# Patient Record
Sex: Female | Born: 1965 | Race: White | Hispanic: No | Marital: Single | Smoking: Never smoker
Health system: Southern US, Community
[De-identification: ages and names within clinical notes are randomized; demographics above are authoritative.]

## PROBLEM LIST (undated history)

## (undated) DIAGNOSIS — J189 Pneumonia, unspecified organism: Secondary | ICD-10-CM

## (undated) DIAGNOSIS — M25552 Pain in left hip: Secondary | ICD-10-CM

## (undated) DIAGNOSIS — E162 Hypoglycemia, unspecified: Secondary | ICD-10-CM

## (undated) DIAGNOSIS — M545 Low back pain, unspecified: Secondary | ICD-10-CM

## (undated) DIAGNOSIS — R51 Headache: Secondary | ICD-10-CM

## (undated) DIAGNOSIS — G43909 Migraine, unspecified, not intractable, without status migrainosus: Secondary | ICD-10-CM

## (undated) DIAGNOSIS — F32A Depression, unspecified: Secondary | ICD-10-CM

## (undated) DIAGNOSIS — J42 Unspecified chronic bronchitis: Secondary | ICD-10-CM

## (undated) DIAGNOSIS — T7840XA Allergy, unspecified, initial encounter: Secondary | ICD-10-CM

## (undated) DIAGNOSIS — R519 Headache, unspecified: Secondary | ICD-10-CM

## (undated) DIAGNOSIS — F329 Major depressive disorder, single episode, unspecified: Secondary | ICD-10-CM

## (undated) DIAGNOSIS — F419 Anxiety disorder, unspecified: Secondary | ICD-10-CM

## (undated) HISTORY — DX: Migraine, unspecified, not intractable, without status migrainosus: G43.909

## (undated) HISTORY — DX: Headache, unspecified: R51.9

## (undated) HISTORY — DX: Headache: R51

## (undated) HISTORY — DX: Allergy, unspecified, initial encounter: T78.40XA

---

## 2000-06-06 ENCOUNTER — Emergency Department (HOSPITAL_COMMUNITY): Admission: EM | Admit: 2000-06-06 | Discharge: 2000-06-07 | Payer: Self-pay | Admitting: Emergency Medicine

## 2000-06-07 ENCOUNTER — Encounter: Payer: Self-pay | Admitting: Emergency Medicine

## 2002-11-22 HISTORY — PX: ABDOMINAL HYSTERECTOMY: SHX81

## 2005-09-01 ENCOUNTER — Ambulatory Visit (HOSPITAL_COMMUNITY): Admission: RE | Admit: 2005-09-01 | Discharge: 2005-09-01 | Payer: Self-pay | Admitting: Obstetrics and Gynecology

## 2008-10-04 ENCOUNTER — Emergency Department (HOSPITAL_COMMUNITY): Admission: EM | Admit: 2008-10-04 | Discharge: 2008-10-05 | Payer: Self-pay | Admitting: Emergency Medicine

## 2010-04-19 ENCOUNTER — Emergency Department (HOSPITAL_COMMUNITY)
Admission: EM | Admit: 2010-04-19 | Discharge: 2010-04-19 | Payer: Self-pay | Source: Home / Self Care | Admitting: Emergency Medicine

## 2011-03-03 ENCOUNTER — Ambulatory Visit
Admission: RE | Admit: 2011-03-03 | Discharge: 2011-03-03 | Disposition: A | Discharge status: Home / Self Care | Payer: BC Managed Care – PPO | Source: Ambulatory Visit | Attending: Family Medicine | Admitting: Family Medicine

## 2011-03-03 ENCOUNTER — Other Ambulatory Visit: Payer: Self-pay | Admitting: Family Medicine

## 2011-03-03 DIAGNOSIS — R51 Headache: Secondary | ICD-10-CM

## 2011-12-31 ENCOUNTER — Other Ambulatory Visit (HOSPITAL_COMMUNITY): Payer: Self-pay | Admitting: *Deleted

## 2011-12-31 DIAGNOSIS — Z1231 Encounter for screening mammogram for malignant neoplasm of breast: Secondary | ICD-10-CM

## 2012-01-27 ENCOUNTER — Ambulatory Visit (HOSPITAL_COMMUNITY)
Admission: RE | Admit: 2012-01-27 | Discharge: 2012-01-27 | Disposition: A | Discharge status: Home / Self Care | Payer: BC Managed Care – PPO | Source: Ambulatory Visit | Attending: Obstetrics and Gynecology | Admitting: Obstetrics and Gynecology

## 2012-01-27 DIAGNOSIS — Z1231 Encounter for screening mammogram for malignant neoplasm of breast: Secondary | ICD-10-CM | POA: Insufficient documentation

## 2012-11-12 ENCOUNTER — Emergency Department (INDEPENDENT_AMBULATORY_CARE_PROVIDER_SITE_OTHER)
Admission: EM | Admit: 2012-11-12 | Discharge: 2012-11-12 | Disposition: A | Discharge status: Home / Self Care | Payer: Worker's Compensation | Source: Home / Self Care

## 2012-11-12 ENCOUNTER — Encounter (HOSPITAL_COMMUNITY): Payer: Self-pay | Admitting: Emergency Medicine

## 2012-11-12 DIAGNOSIS — M25569 Pain in unspecified knee: Secondary | ICD-10-CM

## 2012-11-12 DIAGNOSIS — M25562 Pain in left knee: Secondary | ICD-10-CM

## 2012-11-12 HISTORY — DX: Hypoglycemia, unspecified: E16.2

## 2012-11-12 HISTORY — DX: Migraine, unspecified, not intractable, without status migrainosus: G43.909

## 2012-11-12 NOTE — ED Notes (Signed)
Patient reports left knee pain.  Patient reports injury occurred when she was picking up equipment at school, bent down/twisted to pick up a ball.  Pain continues, reports swelling and pain in knee.  Soreness around new with specific pain in medial aspect of left knee.

## 2012-11-12 NOTE — ED Provider Notes (Signed)
Medical screening examination/treatment/procedure(s) were performed by non-physician practitioner and as supervising physician I was immediately available for consultation/collaboration.  Leslee Home, M.D.  Reuben Likes, MD 11/12/12 [phone]

## 2012-11-12 NOTE — ED Notes (Signed)
Extensive teaching and repeated application of knee immobilizer.

## 2012-11-12 NOTE — ED Provider Notes (Signed)
History     CSN: [id number]  Arrival date & time 11/12/12  1308   None     Chief Complaint  Patient presents with  . Knee Pain    (Consider location/radiation/quality/duration/timing/severity/associated sxs/prior treatment) Patient is a 47 y.o. female presenting with knee pain. The history is provided by the patient. No language interpreter was used.  Knee Pain Location:  Knee Time since incident:  2 weeks Knee location:  L knee Pain details:    Quality:  Aching   Timing:  Constant   Progression:  Worsening Chronicity:  New Prior injury to area:  No Worsened by:  Nothing tried Ineffective treatments:  None tried Associated symptoms: decreased ROM   Pt complains of pain in her left knee.   Pt injured May 8.   Pt had xray by primary,  No fx.   Pt sent here for evaluation due to worker comp.  Pt reports she twisted picking up a ball.   Pt complains of continued swelling  Past Medical History  Diagnosis Date  . Migraine   . Hypoglycemia     Past Surgical History  Procedure Laterality Date  . Abdominal hysterectomy      No family history on file.  History  Substance Use Topics  . Smoking status: Never Smoker   . Smokeless tobacco: Not on file  . Alcohol Use: No    OB History   Grav Para Term Preterm Abortions TAB SAB Ect Mult Living                  Review of Systems  Musculoskeletal: Positive for myalgias and joint swelling.  All other systems reviewed and are negative.    Allergies  Celebrex and Minocycline  Home Medications   Current Outpatient Rx  Name  Route  Sig  Dispense  Refill  . estradiol (ESTRACE) 2 MG tablet   Oral   Take 2 mg by mouth daily.         . hydrOXYzine (ATARAX/VISTARIL) 25 MG tablet   Oral   Take 25 mg by mouth 3 (three) times daily as needed for itching.         Marland Kitchen ibuprofen (ADVIL,MOTRIN) 200 MG tablet   Oral   Take 200 mg by mouth every 6 (six) hours as needed for pain.         . montelukast (SINGULAIR) 10 MG  tablet   Oral   Take 10 mg by mouth at bedtime.           BP 138/66  Pulse 66  Temp(Src) 98.3 F (36.8 C) (Oral)  Resp 16  SpO2 100%  Physical Exam  Nursing note and vitals reviewed. Constitutional: She is oriented to person, place, and time. She appears well-developed and well-nourished.  HENT:  Head: Normocephalic and atraumatic.  Musculoskeletal: She exhibits tenderness.  Swollen tender left knee,  Decreased range of motion,  Ns and nv intact  Neurological: She is alert and oriented to person, place, and time. She has normal reflexes.  Skin: Skin is warm.  Psychiatric: She has a normal mood and affect.    ED Course  Procedures (including critical care time)  Labs Reviewed - No data to display No results found.   1. Knee pain, left       MDM  Pt's primary advised her that she needs an MRi and probably has a cartiledge tear.    Pt placed in a knee imbolizer and given crutches.   I advised call  next week to schedule to see Dr. Rayburn Ma for evaluation       Elson Areas, PA-C 11/12/12 1635

## 2012-11-12 NOTE — ED Notes (Signed)
Provided with icepacks.

## 2012-11-12 NOTE — ED Notes (Signed)
Discharge pending orthopedic supplies

## 2013-01-19 ENCOUNTER — Other Ambulatory Visit (HOSPITAL_COMMUNITY): Payer: Self-pay | Admitting: Obstetrics and Gynecology

## 2013-01-19 DIAGNOSIS — Z1231 Encounter for screening mammogram for malignant neoplasm of breast: Secondary | ICD-10-CM

## 2013-01-28 ENCOUNTER — Ambulatory Visit (HOSPITAL_COMMUNITY): Payer: BC Managed Care – PPO

## 2013-02-02 ENCOUNTER — Ambulatory Visit (HOSPITAL_COMMUNITY)
Admission: RE | Admit: 2013-02-02 | Discharge: 2013-02-02 | Disposition: A | Discharge status: Home / Self Care | Payer: BC Managed Care – PPO | Source: Ambulatory Visit | Attending: Obstetrics and Gynecology | Admitting: Obstetrics and Gynecology

## 2013-02-02 DIAGNOSIS — Z1231 Encounter for screening mammogram for malignant neoplasm of breast: Secondary | ICD-10-CM

## 2014-01-26 ENCOUNTER — Other Ambulatory Visit (HOSPITAL_COMMUNITY): Payer: Self-pay | Admitting: Obstetrics and Gynecology

## 2014-01-26 DIAGNOSIS — Z1231 Encounter for screening mammogram for malignant neoplasm of breast: Secondary | ICD-10-CM

## 2014-02-06 ENCOUNTER — Ambulatory Visit (HOSPITAL_COMMUNITY)
Admission: RE | Admit: 2014-02-06 | Discharge: 2014-02-06 | Disposition: A | Discharge status: Home / Self Care | Payer: BC Managed Care – PPO | Source: Ambulatory Visit | Attending: Obstetrics and Gynecology | Admitting: Obstetrics and Gynecology

## 2014-02-06 DIAGNOSIS — Z1231 Encounter for screening mammogram for malignant neoplasm of breast: Secondary | ICD-10-CM

## 2015-01-08 ENCOUNTER — Other Ambulatory Visit (HOSPITAL_COMMUNITY): Payer: Self-pay | Admitting: Obstetrics and Gynecology

## 2015-01-08 DIAGNOSIS — Z1231 Encounter for screening mammogram for malignant neoplasm of breast: Secondary | ICD-10-CM

## 2015-02-08 ENCOUNTER — Ambulatory Visit (HOSPITAL_COMMUNITY): Payer: BC Managed Care – PPO

## 2015-02-08 ENCOUNTER — Ambulatory Visit (HOSPITAL_COMMUNITY)
Admission: RE | Admit: 2015-02-08 | Discharge: 2015-02-08 | Disposition: A | Discharge status: Home / Self Care | Payer: BC Managed Care – PPO | Source: Ambulatory Visit | Attending: Obstetrics and Gynecology | Admitting: Obstetrics and Gynecology

## 2015-02-08 DIAGNOSIS — Z1231 Encounter for screening mammogram for malignant neoplasm of breast: Secondary | ICD-10-CM | POA: Diagnosis present

## 2015-05-24 ENCOUNTER — Observation Stay (HOSPITAL_COMMUNITY)
Admission: EM | Admit: 2015-05-24 | Discharge: 2015-05-26 | Disposition: A | Discharge status: Home / Self Care | Payer: BC Managed Care – PPO | Attending: General Surgery | Admitting: General Surgery

## 2015-05-24 ENCOUNTER — Encounter (HOSPITAL_COMMUNITY): Payer: Self-pay | Admitting: Emergency Medicine

## 2015-05-24 DIAGNOSIS — G43909 Migraine, unspecified, not intractable, without status migrainosus: Secondary | ICD-10-CM | POA: Insufficient documentation

## 2015-05-24 DIAGNOSIS — K8010 Calculus of gallbladder with chronic cholecystitis without obstruction: Secondary | ICD-10-CM | POA: Diagnosis not present

## 2015-05-24 DIAGNOSIS — Z888 Allergy status to other drugs, medicaments and biological substances status: Secondary | ICD-10-CM | POA: Diagnosis not present

## 2015-05-24 DIAGNOSIS — K819 Cholecystitis, unspecified: Secondary | ICD-10-CM | POA: Diagnosis present

## 2015-05-24 DIAGNOSIS — Z6841 Body Mass Index (BMI) 40.0 and over, adult: Secondary | ICD-10-CM | POA: Insufficient documentation

## 2015-05-24 DIAGNOSIS — R1011 Right upper quadrant pain: Secondary | ICD-10-CM | POA: Diagnosis present

## 2015-05-24 HISTORY — DX: Anxiety disorder, unspecified: F41.9

## 2015-05-24 HISTORY — DX: Pneumonia, unspecified organism: J18.9

## 2015-05-24 HISTORY — DX: Low back pain, unspecified: M54.50

## 2015-05-24 HISTORY — DX: Pain in left hip: M25.552

## 2015-05-24 HISTORY — DX: Low back pain: M54.5

## 2015-05-24 HISTORY — DX: Unspecified chronic bronchitis: J42

## 2015-05-24 HISTORY — DX: Major depressive disorder, single episode, unspecified: F32.9

## 2015-05-24 HISTORY — DX: Depression, unspecified: F32.A

## 2015-05-24 LAB — URINALYSIS, ROUTINE W REFLEX MICROSCOPIC
BILIRUBIN URINE: NEGATIVE
GLUCOSE, UA: NEGATIVE mg/dL
Hgb urine dipstick: NEGATIVE
KETONES UR: NEGATIVE mg/dL
Leukocytes, UA: NEGATIVE
Nitrite: NEGATIVE
PH: 5.5 (ref 5.0–8.0)
PROTEIN: NEGATIVE mg/dL
Specific Gravity, Urine: 1.016 (ref 1.005–1.030)

## 2015-05-24 LAB — COMPREHENSIVE METABOLIC PANEL
ALK PHOS: 64 U/L (ref 38–126)
ALT: 16 U/L (ref 14–54)
ANION GAP: 9 (ref 5–15)
AST: 19 U/L (ref 15–41)
Albumin: 4.1 g/dL (ref 3.5–5.0)
BUN: 10 mg/dL (ref 6–20)
CALCIUM: 9.3 mg/dL (ref 8.9–10.3)
CO2: 25 mmol/L (ref 22–32)
Chloride: 101 mmol/L (ref 101–111)
Creatinine, Ser: 0.8 mg/dL (ref 0.44–1.00)
GFR calc non Af Amer: >60 mL/min (ref >60)
Glucose, Bld: 119 mg/dL — ABNORMAL HIGH (ref 65–99)
Potassium: 4.2 mmol/L (ref 3.5–5.1)
SODIUM: 135 mmol/L (ref 135–145)
Total Bilirubin: 0.5 mg/dL (ref 0.3–1.2)
Total Protein: 7.3 g/dL (ref 6.5–8.1)

## 2015-05-24 LAB — CBC
HCT: 38.4 % (ref 36.0–46.0)
HEMOGLOBIN: 13.4 g/dL (ref 12.0–15.0)
MCH: 29.5 pg (ref 26.0–34.0)
MCHC: 34.9 g/dL (ref 30.0–36.0)
MCV: 84.6 fL (ref 78.0–100.0)
Platelets: 279 10*3/uL (ref 150–400)
RBC: 4.54 MIL/uL (ref 3.87–5.11)
RDW: 12.4 % (ref 11.5–15.5)
WBC: 7.7 10*3/uL (ref 4.0–10.5)

## 2015-05-24 LAB — LIPASE, BLOOD: LIPASE: 32 U/L (ref 11–51)

## 2015-05-24 MED ORDER — MORPHINE SULFATE (PF) 4 MG/ML IV SOLN
6.0000 mg | Freq: Once | INTRAVENOUS | Status: AC
  Administered 2015-05-25: 6 mg via INTRAVENOUS
  Filled 2015-05-24: qty 2

## 2015-05-24 MED ORDER — SODIUM CHLORIDE 0.9 % IV BOLUS (SEPSIS)
1000.0000 mL | Freq: Once | INTRAVENOUS | Status: AC
  Administered 2015-05-25: 1000 mL via INTRAVENOUS

## 2015-05-24 MED ORDER — ONDANSETRON HCL 4 MG/2ML IJ SOLN
4.0000 mg | Freq: Once | INTRAMUSCULAR | Status: AC
  Administered 2015-05-25: 4 mg via INTRAVENOUS
  Filled 2015-05-24: qty 2

## 2015-05-24 MED ORDER — ONDANSETRON 4 MG PO TBDP
4.0000 mg | ORAL_TABLET | Freq: Once | ORAL | Status: AC | PRN
  Administered 2015-05-24: 4 mg via ORAL

## 2015-05-24 MED ORDER — ONDANSETRON 4 MG PO TBDP
ORAL_TABLET | ORAL | Status: AC
  Filled 2015-05-24: qty 1

## 2015-05-24 NOTE — ED Provider Notes (Signed)
CSN: [id number]   Arrival date & time 05/24/15 2142  History  By signing my name below, I, Altamease Oiler, attest that this documentation has been prepared under the direction and in the presence of Everlene Balls, MD. Electronically Signed: Altamease Oiler, ED Scribe. 05/24/2015. 11:40 PM.  Chief Complaint  Patient presents with  . Abdominal Pain    HPI The history is provided by the patient. No language interpreter was used.   Abigail Golden is a 49 y.o. female who presents to the Emergency Department complaining of new, intermittent, sharp, mid abdominal pain with onset 05/08/15. Pt states that she has had 4 episodes of this pain and that she was seen by her PCP for the most recent episode 1 week ago and started on Prilosec and Zofran instead of the Phenergan that she already had at home. Bowel movements variably improve the pain. Associated symptoms include nausea, 1 episode of emesis 2 weeks ago, loose stool for 2 weeks, chills, and brief episodes of generalized weakness. Pt denies dysuria, hematuria, hematochezia, and fever. Pt states that she has been eating less over the last 2 weeks but last night she was able to eat a kids meal from [place]. Past surgical history is significant for abdominal hysterectomy  Past Medical History  Diagnosis Date  . Migraine   . Hypoglycemia     Past Surgical History  Procedure Laterality Date  . Abdominal hysterectomy      No family history on file.  Social History  Substance Use Topics  . Smoking status: Never Smoker   . Smokeless tobacco: None  . Alcohol Use: No     Review of Systems 10 Systems reviewed and all are negative for acute change except as noted in the HPI. Home Medications   Prior to Admission medications   Medication Sig Start Date End Date Taking? Authorizing Provider  estradiol (ESTRACE) 2 MG tablet Take 2 mg by mouth daily.   Yes Historical Provider, MD  hydrOXYzine (ATARAX/VISTARIL) 25 MG tablet Take 25 mg by mouth 3  (three) times daily as needed for itching.   Yes Historical Provider, MD  ibuprofen (ADVIL,MOTRIN) 200 MG tablet Take 200 mg by mouth every 6 (six) hours as needed for pain.   Yes Historical Provider, MD  montelukast (SINGULAIR) 10 MG tablet Take 10 mg by mouth at bedtime.   Yes Historical Provider, MD    Allergies  Celebrex and Minocycline  Triage Vitals: BP 156/83 mmHg  Pulse 75  Temp(Src) 98 F (36.7 C) (Oral)  Resp 18  Ht '5\' 1"'$  (1.549 m)  Wt 249 lb 2 oz (113.002 kg)  BMI 47.10 kg/m2  SpO2 96%  Physical Exam  Constitutional: She is oriented to person, place, and time. She appears well-developed and well-nourished. No distress.  Obese female  HENT:  Head: Normocephalic and atraumatic.  Nose: Nose normal.  Mouth/Throat: Oropharynx is clear and moist. No oropharyngeal exudate.  Eyes: Conjunctivae and EOM are normal. Pupils are equal, round, and reactive to light. No scleral icterus.  Neck: Normal range of motion. Neck supple. No JVD present. No tracheal deviation present. No thyromegaly present.  Cardiovascular: Normal rate, regular rhythm and normal heart sounds.  Exam reveals no gallop and no friction rub.   No murmur heard. Pulmonary/Chest: Effort normal and breath sounds normal. No respiratory distress. She has no wheezes. She exhibits no tenderness.  Abdominal: Soft. Bowel sounds are normal. She exhibits no distension and no mass. There is generalized tenderness. There is no rebound  and no guarding.  Musculoskeletal: Normal range of motion. She exhibits no edema or tenderness.  Lymphadenopathy:    She has no cervical adenopathy.  Neurological: She is alert and oriented to person, place, and time. No cranial nerve deficit. She exhibits normal muscle tone.  Skin: Skin is warm and dry. No rash noted. No erythema. No pallor.  Nursing note and vitals reviewed.   ED Course  Procedures   DIAGNOSTIC STUDIES: Oxygen Saturation is 96% on RA, normal by my interpretation.     COORDINATION OF CARE: 11:38 PM Discussed treatment plan which includes lab work, abdominal US, Zofran with pt at bedside and pt agreed to plan.  Labs Reviewed  COMPREHENSIVE METABOLIC PANEL - Abnormal; Notable for the following:    Glucose, Bld 119 (*)    All other components within normal limits  LIPASE, BLOOD  CBC  URINALYSIS, ROUTINE W REFLEX MICROSCOPIC (NOT AT [facility])    Imaging Review Ct Abdomen Pelvis W Contrast  05/25/2015  CLINICAL DATA:  49 year old female with diffuse abdominal pain x2 weeks. EXAM: CT ABDOMEN AND PELVIS WITH CONTRAST TECHNIQUE: Multidetector CT imaging of the abdomen and pelvis was performed using the standard protocol following bolus administration of intravenous contrast. CONTRAST:  1109m OMNIPAQUE IOHEXOL 300 MG/ML  SOLN COMPARISON:  Ultrasound dated 05/25/2015 FINDINGS: The visualized lung bases are clear. No intra-abdominal free air or free fluid identified. The liver appears unremarkable. The gallbladder is mildly distended. There is mild apparent gallbladder wall thickening. A focal area of high attenuation noted within the body of the gallbladder which may represent sludge, small stones or focal gallbladder wall thickening/adenomyomatosis. This was not clearly visualized on the ultrasound study. No significant pericholecystic fluid identified. Repeat gallbladder ultrasound or a hepatobiliary scintigraphy is recommended if there is clinical concern for gallbladder pathology. The pancreas, spleen, adrenal glands, kidneys, visualized ureters, and urinary bladder appear unremarkable. Hysterectomy. There is no evidence of bowel obstruction or inflammation. Normal appendix. The abdominal aorta and IVC appear patent. No portal venous gas identified. There is no lymphadenopathy. There is minimal stranding of the fat surrounding the celiac axis, nonspecific. There is mild mesentery stranding. Clinical correlation is recommended to evaluate for enteritis. The abdominal wall  soft tissues appear unremarkable. There is mild degenerative changes of the spine. No acute fracture. IMPRESSION: Focal high attenuation within the gallbladder may represent sludge, small stones, or focal adenomyomatosis. There is apparent diffuse thickening of the gallbladder wall. Repeat ultrasound or a hepatobiliary scintigraphy is recommended for better evaluation of the gallbladder. The no evidence of bowel obstruction or inflammation. Normal appendix. Mild mesenteric stranding. Clinical correlation is recommended to evaluate for enteritis. Electronically Signed   By: AAnner CreteM.D.   On: 05/25/2015 03:39   [place]domen Limited Ruq  05/25/2015  CLINICAL DATA:  Right upper quadrant pain EXAM: [place]DOMEN LIMITED - RIGHT UPPER QUADRANT COMPARISON:  None. FINDINGS: Gallbladder: No gallstones or wall thickening visualized. No sonographic Murphy sign noted. Common bile duct: Diameter: 4.3 mm Liver: Mildly irregular hepatic echotexture without focal mass. No intrahepatic bile duct dilatation. IMPRESSION: Normal gallbladder and bile ducts. Slightly irregular hepatic echotexture is nonspecific and could represent mild fatty infiltration or other hepatocellular disease. Electronically Signed   By: DAndreas NewportM.D.   On: 05/25/2015 00:56    I personally reviewed and evaluated these images and lab results as a part of my medical decision-making.     MDM   Final diagnoses:  RUQ pain    Patient presents to  the ED for abdominal pain.  Her exam is diffuse however her history is consistent with gb pathology.  Will obtain RUQ [place] for evaluation and follow up with CT scan if negative.  She was given morphine and zofran for relief in the ED.    Patient continues to be in pain.  CT scan shows concern for cholecystitis. I spoke with Dr. Redmond Pulling with general surgery who will review the images and evaluate the patient.  She is refusing more pain medication but states it still hurts.  She is still tender in  the RUQ and midepigastrium.  Dr. Redmond Pulling will admit the patient for further care.  I personally performed the services described in this documentation, which was scribed in my presence. The recorded information has been reviewed and is accurate.     Everlene Balls, MD 05/25/15 [phone]

## 2015-05-24 NOTE — ED Notes (Signed)
Pt. reports mid abdominal pain with emesis and diarrhea since Nov. 15 , 2016 , seen by her PCP last Friday prescribed with Prilosec and Zofran with no relief, denies fever or chills , no urinary discomfort .

## 2015-05-25 ENCOUNTER — Encounter (HOSPITAL_COMMUNITY): Payer: Self-pay | Admitting: Certified Registered Nurse Anesthetist

## 2015-05-25 ENCOUNTER — Encounter (HOSPITAL_COMMUNITY)
Admission: EM | Disposition: A | Discharge status: Home / Self Care | Payer: Self-pay | Source: Home / Self Care | Attending: Emergency Medicine

## 2015-05-25 ENCOUNTER — Emergency Department (HOSPITAL_COMMUNITY): Payer: BC Managed Care – PPO

## 2015-05-25 ENCOUNTER — Observation Stay (HOSPITAL_COMMUNITY): Payer: BC Managed Care – PPO | Admitting: Anesthesiology

## 2015-05-25 DIAGNOSIS — K819 Cholecystitis, unspecified: Secondary | ICD-10-CM | POA: Diagnosis present

## 2015-05-25 HISTORY — PX: LAPAROSCOPIC CHOLECYSTECTOMY: SUR755

## 2015-05-25 HISTORY — PX: CHOLECYSTECTOMY: SHX55

## 2015-05-25 SURGERY — LAPAROSCOPIC CHOLECYSTECTOMY
Anesthesia: General | Site: Abdomen

## 2015-05-25 MED ORDER — PROPOFOL 10 MG/ML IV BOLUS
INTRAVENOUS | Status: DC | PRN
  Administered 2015-05-25: 200 mg via INTRAVENOUS

## 2015-05-25 MED ORDER — SUCCINYLCHOLINE CHLORIDE 20 MG/ML IJ SOLN
INTRAMUSCULAR | Status: DC | PRN
  Administered 2015-05-25: 100 mg via INTRAVENOUS

## 2015-05-25 MED ORDER — MIDAZOLAM HCL 5 MG/5ML IJ SOLN
INTRAMUSCULAR | Status: DC | PRN
  Administered 2015-05-25: 2 mg via INTRAVENOUS

## 2015-05-25 MED ORDER — LIDOCAINE HCL (CARDIAC) 20 MG/ML IV SOLN
INTRAVENOUS | Status: DC | PRN
  Administered 2015-05-25: 60 mg via INTRAVENOUS

## 2015-05-25 MED ORDER — LACTATED RINGERS IV SOLN
INTRAVENOUS | Status: DC | PRN
Start: 2015-05-25 — End: 2015-05-25
  Administered 2015-05-25: 12:00:00 via INTRAVENOUS

## 2015-05-25 MED ORDER — KCL IN DEXTROSE-NACL 20-5-0.45 MEQ/L-%-% IV SOLN
INTRAVENOUS | Status: DC
  Administered 2015-05-25 (×3): via INTRAVENOUS
  Filled 2015-05-25 (×7): qty 1000

## 2015-05-25 MED ORDER — HYDROMORPHONE HCL 1 MG/ML IJ SOLN
0.2500 mg | INTRAMUSCULAR | Status: DC | PRN
  Administered 2015-05-25: 0.5 mg via INTRAVENOUS

## 2015-05-25 MED ORDER — PROMETHAZINE HCL 25 MG/ML IJ SOLN: 6.2500 mg | INTRAMUSCULAR | Status: DC | PRN

## 2015-05-25 MED ORDER — DIPHENHYDRAMINE HCL 12.5 MG/5ML PO ELIX: 12.5000 mg | ORAL_SOLUTION | Freq: Four times a day (QID) | ORAL | Status: DC | PRN

## 2015-05-25 MED ORDER — ONDANSETRON 4 MG PO TBDP
4.0000 mg | ORAL_TABLET | Freq: Four times a day (QID) | ORAL | Status: DC | PRN
  Administered 2015-05-25: 4 mg via ORAL
  Filled 2015-05-25: qty 1

## 2015-05-25 MED ORDER — EPHEDRINE SULFATE 50 MG/ML IJ SOLN
INTRAMUSCULAR | Status: DC | PRN
  Administered 2015-05-25 (×2): 10 mg via INTRAVENOUS

## 2015-05-25 MED ORDER — MIDAZOLAM HCL 2 MG/2ML IJ SOLN
INTRAMUSCULAR | Status: AC
  Filled 2015-05-25: qty 2

## 2015-05-25 MED ORDER — ONDANSETRON HCL 4 MG/2ML IJ SOLN
INTRAMUSCULAR | Status: DC | PRN
  Administered 2015-05-25: 4 mg via INTRAVENOUS

## 2015-05-25 MED ORDER — LIDOCAINE HCL (CARDIAC) 20 MG/ML IV SOLN
INTRAVENOUS | Status: AC
  Filled 2015-05-25: qty 5

## 2015-05-25 MED ORDER — MONTELUKAST SODIUM 10 MG PO TABS
10.0000 mg | ORAL_TABLET | Freq: Every day | ORAL | Status: DC
  Administered 2015-05-25: 10 mg via ORAL
  Filled 2015-05-25: qty 1

## 2015-05-25 MED ORDER — SUGAMMADEX SODIUM 500 MG/5ML IV SOLN
INTRAVENOUS | Status: AC
  Filled 2015-05-25: qty 5

## 2015-05-25 MED ORDER — EPHEDRINE SULFATE 50 MG/ML IJ SOLN
INTRAMUSCULAR | Status: AC
  Filled 2015-05-25: qty 1

## 2015-05-25 MED ORDER — METHOCARBAMOL 500 MG PO TABS
500.0000 mg | ORAL_TABLET | Freq: Four times a day (QID) | ORAL | Status: DC | PRN
  Administered 2015-05-25: 500 mg via ORAL
  Filled 2015-05-25: qty 1

## 2015-05-25 MED ORDER — IOHEXOL 300 MG/ML  SOLN
100.0000 mL | Freq: Once | INTRAMUSCULAR | Status: AC | PRN
Start: 2015-05-25 — End: 2015-05-25
  Administered 2015-05-25: 100 mL via INTRAVENOUS

## 2015-05-25 MED ORDER — PHENYLEPHRINE 40 MCG/ML (10ML) SYRINGE FOR IV PUSH (FOR BLOOD PRESSURE SUPPORT)
PREFILLED_SYRINGE | INTRAVENOUS | Status: AC
  Filled 2015-05-25: qty 20

## 2015-05-25 MED ORDER — PROPOFOL 10 MG/ML IV BOLUS
INTRAVENOUS | Status: AC
  Filled 2015-05-25: qty 20

## 2015-05-25 MED ORDER — PHENYLEPHRINE HCL 10 MG/ML IJ SOLN
INTRAMUSCULAR | Status: DC | PRN
  Administered 2015-05-25: 40 ug via INTRAVENOUS
  Administered 2015-05-25: 80 ug via INTRAVENOUS

## 2015-05-25 MED ORDER — FENTANYL CITRATE (PF) 100 MCG/2ML IJ SOLN
INTRAMUSCULAR | Status: DC | PRN
  Administered 2015-05-25: 50 ug via INTRAVENOUS
  Administered 2015-05-25: 100 ug via INTRAVENOUS
  Administered 2015-05-25: 50 ug via INTRAVENOUS

## 2015-05-25 MED ORDER — ENOXAPARIN SODIUM 40 MG/0.4ML ~~LOC~~ SOLN: 40.0000 mg | SUBCUTANEOUS | Status: DC

## 2015-05-25 MED ORDER — SODIUM CHLORIDE 0.9 % IR SOLN
Status: DC | PRN
  Administered 2015-05-25: 1

## 2015-05-25 MED ORDER — ONDANSETRON HCL 4 MG/2ML IJ SOLN
4.0000 mg | Freq: Four times a day (QID) | INTRAMUSCULAR | Status: DC | PRN
  Administered 2015-05-25: 4 mg via INTRAVENOUS
  Filled 2015-05-25: qty 2

## 2015-05-25 MED ORDER — BOOST / RESOURCE BREEZE PO LIQD
1.0000 | Freq: Three times a day (TID) | ORAL | Status: DC
  Administered 2015-05-25 – 2015-05-26 (×2): 1 via ORAL

## 2015-05-25 MED ORDER — DIPHENHYDRAMINE HCL 50 MG/ML IJ SOLN: 12.5000 mg | Freq: Four times a day (QID) | INTRAMUSCULAR | Status: DC | PRN

## 2015-05-25 MED ORDER — STERILE WATER FOR INJECTION IJ SOLN
INTRAMUSCULAR | Status: AC
  Filled 2015-05-25: qty 10

## 2015-05-25 MED ORDER — BUPIVACAINE HCL 0.25 % IJ SOLN
INTRAMUSCULAR | Status: DC | PRN
  Administered 2015-05-25: 4 mL

## 2015-05-25 MED ORDER — 0.9 % SODIUM CHLORIDE (POUR BTL) OPTIME
TOPICAL | Status: DC | PRN
  Administered 2015-05-25: 1000 mL

## 2015-05-25 MED ORDER — MORPHINE SULFATE (PF) 2 MG/ML IV SOLN
1.0000 mg | INTRAVENOUS | Status: DC | PRN
  Administered 2015-05-25: 4 mg via INTRAVENOUS
  Filled 2015-05-25: qty 2

## 2015-05-25 MED ORDER — FENTANYL CITRATE (PF) 250 MCG/5ML IJ SOLN
INTRAMUSCULAR | Status: AC
  Filled 2015-05-25: qty 5

## 2015-05-25 MED ORDER — KCL IN DEXTROSE-NACL 20-5-0.45 MEQ/L-%-% IV SOLN
INTRAVENOUS | Status: AC
  Filled 2015-05-25: qty 1000

## 2015-05-25 MED ORDER — ESTRADIOL 1 MG PO TABS
2.0000 mg | ORAL_TABLET | Freq: Every day | ORAL | Status: DC
  Administered 2015-05-25: 2 mg via ORAL
  Filled 2015-05-25: qty 2

## 2015-05-25 MED ORDER — DEXAMETHASONE SODIUM PHOSPHATE 4 MG/ML IJ SOLN
INTRAMUSCULAR | Status: DC | PRN
  Administered 2015-05-25: 8 mg via INTRAVENOUS

## 2015-05-25 MED ORDER — PANTOPRAZOLE SODIUM 40 MG IV SOLR
40.0000 mg | Freq: Every day | INTRAVENOUS | Status: DC
  Administered 2015-05-25: 40 mg via INTRAVENOUS
  Filled 2015-05-25: qty 40

## 2015-05-25 MED ORDER — PROMETHAZINE HCL 25 MG/ML IJ SOLN
12.5000 mg | Freq: Four times a day (QID) | INTRAMUSCULAR | Status: DC | PRN
  Filled 2015-05-25: qty 1

## 2015-05-25 MED ORDER — HYDROCODONE-ACETAMINOPHEN 5-325 MG PO TABS
1.0000 | ORAL_TABLET | ORAL | Status: DC | PRN
  Administered 2015-05-25 – 2015-05-26 (×3): 2 via ORAL
  Filled 2015-05-25 (×3): qty 2

## 2015-05-25 MED ORDER — HYDROMORPHONE HCL 1 MG/ML IJ SOLN
INTRAMUSCULAR | Status: AC
  Filled 2015-05-25: qty 1

## 2015-05-25 MED ORDER — SUGAMMADEX SODIUM 500 MG/5ML IV SOLN
INTRAVENOUS | Status: DC | PRN
  Administered 2015-05-25: 250 mg via INTRAVENOUS

## 2015-05-25 MED ORDER — BUPIVACAINE HCL (PF) 0.25 % IJ SOLN
INTRAMUSCULAR | Status: AC
  Filled 2015-05-25: qty 30

## 2015-05-25 MED ORDER — MORPHINE SULFATE (PF) 4 MG/ML IV SOLN
4.0000 mg | Freq: Once | INTRAVENOUS | Status: AC
  Administered 2015-05-25: 4 mg via INTRAVENOUS
  Filled 2015-05-25: qty 1

## 2015-05-25 MED ORDER — ROCURONIUM BROMIDE 100 MG/10ML IV SOLN
INTRAVENOUS | Status: DC | PRN
  Administered 2015-05-25: 20 mg via INTRAVENOUS

## 2015-05-25 MED ORDER — ROCURONIUM BROMIDE 50 MG/5ML IV SOLN
INTRAVENOUS | Status: AC
  Filled 2015-05-25: qty 1

## 2015-05-25 MED ORDER — ONDANSETRON HCL 4 MG/2ML IJ SOLN
INTRAMUSCULAR | Status: AC
  Filled 2015-05-25: qty 2

## 2015-05-25 MED ORDER — DEXTROSE 5 % IV SOLN
2.0000 g | INTRAVENOUS | Status: DC
  Administered 2015-05-25: 2 g via INTRAVENOUS
  Filled 2015-05-25 (×2): qty 2

## 2015-05-25 MED ORDER — DEXAMETHASONE SODIUM PHOSPHATE 4 MG/ML IJ SOLN
INTRAMUSCULAR | Status: AC
  Filled 2015-05-25: qty 2

## 2015-05-25 MED ORDER — SUCCINYLCHOLINE CHLORIDE 20 MG/ML IJ SOLN
INTRAMUSCULAR | Status: AC
  Filled 2015-05-25: qty 1

## 2015-05-25 SURGICAL SUPPLY — 41 items
APL SKNCLS STERI-STRIP NONHPOA (GAUZE/BANDAGES/DRESSINGS) ×1
BAG SPEC RTRVL 10 TROC 200 (ENDOMECHANICALS) ×1
BENZOIN TINCTURE PRP APPL 2/3 (GAUZE/BANDAGES/DRESSINGS) ×2 IMPLANT
CANISTER SUCTION 2500CC (MISCELLANEOUS) ×2 IMPLANT
CHLORAPREP W/TINT 26ML (MISCELLANEOUS) ×2 IMPLANT
CLIP LIGATING HEMO O LOK GREEN (MISCELLANEOUS) ×2 IMPLANT
CLSR STERI-STRIP ANTIMIC 1/2X4 (GAUZE/BANDAGES/DRESSINGS) ×2 IMPLANT
COVER SURGICAL LIGHT HANDLE (MISCELLANEOUS) ×2 IMPLANT
COVER TRANSDUCER ULTRASND (DRAPES) ×2 IMPLANT
DEVICE TROCAR PUNCTURE CLOSURE (ENDOMECHANICALS) ×2 IMPLANT
ELECT REM PT RETURN 9FT ADLT (ELECTROSURGICAL) ×2
ELECTRODE REM PT RTRN 9FT ADLT (ELECTROSURGICAL) ×1 IMPLANT
GAUZE SPONGE 2X2 8PLY STRL LF (GAUZE/BANDAGES/DRESSINGS) ×1 IMPLANT
GLOVE BIO SURGEON STRL SZ7.5 (GLOVE) ×2 IMPLANT
GLOVE BIOGEL PI IND STRL 7.0 (GLOVE) ×2 IMPLANT
GLOVE BIOGEL PI INDICATOR 7.0 (GLOVE) ×2
GLOVE SURG SS PI 7.0 STRL IVOR (GLOVE) ×4 IMPLANT
GOWN STRL REUS W/ TWL LRG LVL3 (GOWN DISPOSABLE) ×2 IMPLANT
GOWN STRL REUS W/ TWL XL LVL3 (GOWN DISPOSABLE) ×1 IMPLANT
GOWN STRL REUS W/TWL LRG LVL3 (GOWN DISPOSABLE) ×2
GOWN STRL REUS W/TWL XL LVL3 (GOWN DISPOSABLE) ×1
KIT BASIN OR (CUSTOM PROCEDURE TRAY) ×2 IMPLANT
KIT ROOM TURNOVER OR (KITS) ×2 IMPLANT
NEEDLE INSUFFLATION 14GA 120MM (NEEDLE) ×2 IMPLANT
NS IRRIG 1000ML POUR BTL (IV SOLUTION) ×2 IMPLANT
PAD ARMBOARD 7.5X6 YLW CONV (MISCELLANEOUS) ×4 IMPLANT
POUCH RETRIEVAL ECOSAC 10 (ENDOMECHANICALS) ×1 IMPLANT
POUCH RETRIEVAL ECOSAC 10MM (ENDOMECHANICALS) ×1
SCISSORS LAP 5X35 DISP (ENDOMECHANICALS) ×2 IMPLANT
SET IRRIG TUBING LAPAROSCOPIC (IRRIGATION / IRRIGATOR) ×2 IMPLANT
SLEEVE ENDOPATH XCEL 5M (ENDOMECHANICALS) ×2 IMPLANT
SPECIMEN JAR SMALL (MISCELLANEOUS) ×2 IMPLANT
SPONGE GAUZE 2X2 STER 10/PKG (GAUZE/BANDAGES/DRESSINGS) ×1
SUT MNCRL AB 3-0 PS2 18 (SUTURE) ×2 IMPLANT
SUT VICRYL 0 UR6 27IN ABS (SUTURE) ×2 IMPLANT
TOWEL OR 17X24 6PK STRL BLUE (TOWEL DISPOSABLE) ×2 IMPLANT
TOWEL OR 17X26 10 PK STRL BLUE (TOWEL DISPOSABLE) ×2 IMPLANT
TRAY LAPAROSCOPIC MC (CUSTOM PROCEDURE TRAY) ×2 IMPLANT
TROCAR XCEL NON-BLD 11X100MML (ENDOMECHANICALS) ×2 IMPLANT
TROCAR XCEL NON-BLD 5MMX100MML (ENDOMECHANICALS) ×2 IMPLANT
TUBING INSUFFLATION (TUBING) ×2 IMPLANT

## 2015-05-25 NOTE — Anesthesia Procedure Notes (Signed)
Procedure Name: Intubation Date/Time: 05/25/2015 12:46 PM Performed by: Garrison Columbus T Pre-anesthesia Checklist: Patient identified, Emergency Drugs available, Suction available and Patient being monitored Patient Re-evaluated:Patient Re-evaluated prior to inductionOxygen Delivery Method: Circle system utilized Preoxygenation: Pre-oxygenation with 100% oxygen Intubation Type: IV induction Ventilation: Mask ventilation without difficulty and Oral airway inserted - appropriate to patient size Laryngoscope Size: Sabra Heck and 2 Grade View: Grade I Tube type: Oral Tube size: 7.5 mm Number of attempts: 1 Airway Equipment and Method: Stylet and Oral airway Placement Confirmation: ETT inserted through vocal cords under direct vision,  positive ETCO2 and breath sounds checked- equal and bilateral Secured at: 21 cm Tube secured with: Tape Dental Injury: Teeth and Oropharynx as per pre-operative assessment

## 2015-05-25 NOTE — Op Note (Signed)
05/25/2015  1:28 PM  PATIENT:  Abigail Golden  49 y.o. female  PRE-OPERATIVE DIAGNOSIS:  Acute Cholecystitis  POST-OPERATIVE DIAGNOSIS:  Same  PROCEDURE:  Procedure(s): LAPAROSCOPIC CHOLECYSTECTOMY (N/A)  SURGEON:  Surgeon(s) and Role:    * Ralene Ok, MD - Primary  ANESTHESIA:   local and general  EBL: Total I/O In: 700 [I.V.:700] Out: 30 [Blood:30]  BLOOD ADMINISTERED:none  DRAINS: none   LOCAL MEDICATIONS USED:  BUPIVICAINE   SPECIMEN:  Source of Specimen:  gallbaldder  DISPOSITION OF SPECIMEN:  PATHOLOGY  COUNTS:  YES  TOURNIQUET:  * No tourniquets in log *  DICTATION: .Dragon Dictation  EBL: <0DX   Complications: none   Counts: reported as correct x 2   Findings:chronic inflammation of the gallbladder  Indications for procedure: Pt is a 49 y/o F with RUQ pain and seen to have gallstones.   Details of the procedure: The patient was taken to the operating and placed in the supine position with bilateral SCDs in place. A time out was called and all facts were verified. A pneumoperitoneum was obtained via A Veress needle technique to a pressure of 87m of mercury. A 561mtrochar was then placed in the right upper quadrant under visualization, and there were no injuries to any abdominal organs. A 11 mm port was then placed in the umbilical region after infiltrating with local anesthesia under direct visualization. A second epigastric port was placed under direct visualization.   The gallbladder was identified and retracted, the peritoneum was then sharply dissected from the gallbladder and this dissection was carried down to Calot's triangle. The cystic duct was identified and stripped away circumferentially and seen going into the gallbladder 360, the critical angle was obtained.   2 clips were placed proximally one distally and the cystic duct transected. The cystic artery was identified and 2 clips placed proximally and one distally and transected. We then  proceeded to remove the gallbladder off the hepatic fossa with Bovie cautery. A retrieval bag was then placed in the abdomen and gallbladder placed in the bag. The hepatic fossa was then reexamined and hemostasis was achieved with Bovie cautery and was excellent at this portion of the case. The subhepatic fossa and perihepatic fossa was then irrigated until the effluent was clear. The specimen bag and specimen were removed from the abdominal cavity.  The 11 mm trocar fascia was reapproximated with the Endo Close #1 Vicryl x3. The pneumoperitoneum was evacuated and all trochars removed under direct visulalization. The skin was then closed with 4-0 Monocryl and the skin dressed with Steri-Strips, gauze, and tape. The patient was awaken from general anesthesia and taken to the recovery room in stable condition.  PLAN OF CARE: Admit for overnight observation  PATIENT DISPOSITION:  PACU - hemodynamically stable.   Delay start of Pharmacological VTE agent (>24hrs) due to surgical blood loss or risk of bleeding: not applicable

## 2015-05-25 NOTE — Progress Notes (Signed)
Admitted to room 6n19 from PACU. Sleepy but easily arousable, family at bedside, denies nausea/pain at this time.

## 2015-05-25 NOTE — ED Notes (Addendum)
Loletha Carrow- [phone]; Marcene [phone]. Contact numbers

## 2015-05-25 NOTE — ED Notes (Signed)
Patient transported to CT 

## 2015-05-25 NOTE — ED Notes (Signed)
Patient transported to Ultrasound 

## 2015-05-25 NOTE — Anesthesia Postprocedure Evaluation (Signed)
Anesthesia Post Note  Patient: Abigail Golden  Procedure(s) Performed: Procedure(s) (LRB): LAPAROSCOPIC CHOLECYSTECTOMY (N/A)  Patient location during evaluation: PACU Anesthesia Type: General Level of consciousness: awake and alert Pain management: pain level controlled Vital Signs Assessment: post-procedure vital signs reviewed and stable Respiratory status: spontaneous breathing, nonlabored ventilation, respiratory function stable and patient connected to nasal cannula oxygen Cardiovascular status: blood pressure returned to baseline and stable Postop Assessment: no signs of nausea or vomiting Anesthetic complications: no    Last Vitals:  Filed Vitals:   05/25/15 1359 05/25/15 1415  BP: 98/50 99/50  Pulse: 78 73  Temp:  36.5 C  Resp: 16 12    Last Pain:  Filed Vitals:   05/25/15 1421  PainSc: Asleep                 Rheda Kassab S

## 2015-05-25 NOTE — ED Notes (Signed)
surgery at bedside

## 2015-05-25 NOTE — ED Notes (Signed)
Obtained consent for procedure

## 2015-05-25 NOTE — Discharge Instructions (Signed)
Your appointment is at 9:45am, please arrive at least 58mn before your appointment to complete your check in paperwork.  If you are unable to arrive 368m prior to your appointment time we may have to cancel or reschedule you.  LAPAROSCOPIC SURGERY: POST OP INSTRUCTIONS  1. DIET: Follow a light bland diet the first 24 hours after arrival home, such as soup, liquids, crackers, etc. Be sure to include lots of fluids daily. Avoid fast food or heavy meals as your are more likely to get nauseated. Eat a low fat the next few days after surgery.  2. Take your usually prescribed home medications unless otherwise directed. 3. PAIN CONTROL:  1. Pain is best controlled by a usual combination of three different methods TOGETHER:  1. Ice/Heat 2. Over the counter pain medication 3. Prescription pain medication 2. Most patients will experience some swelling and bruising around the incisions. Ice packs or heating pads (30-60 minutes up to 6 times a day) will help. Use ice for the first few days to help decrease swelling and bruising, then switch to heat to help relax tight/sore spots and speed recovery. Some people prefer to use ice alone, heat alone, alternating between ice & heat. Experiment to what works for you. Swelling and bruising can take several weeks to resolve.  3. It is helpful to take an over-the-counter pain medication regularly for the first few weeks. Choose one of the following that works best for you:  1. Naproxen (Aleve, etc) Two '220mg'$  tabs twice a day 2. Ibuprofen (Advil, etc) Three '200mg'$  tabs four times a day (every meal & bedtime) 3. Acetaminophen (Tylenol, etc) 500-'650mg'$  four times a day (every meal & bedtime) 4. A prescription for pain medication (such as oxycodone, hydrocodone, etc) should be given to you upon discharge. Take your pain medication as prescribed.  1. If you are having problems/concerns with the prescription medicine (does not control pain, nausea, vomiting, rash, itching,  etc), please call [phone] see if we need to switch you to a different pain medicine that will work better for you and/or control your side effect better. 2. If you need a refill on your pain medication, please contact your pharmacy. They will contact our office to request authorization. Prescriptions will not be filled after 5 pm or on week-ends. 4. Avoid getting constipated. Between the surgery and the pain medications, it is common to experience some constipation. Increasing fluid intake and taking a fiber supplement (such as Metamucil, Citrucel, FiberCon, MiraLax, etc) 1-2 times a day regularly will usually help prevent this problem from occurring. A mild laxative (prune juice, Milk of Magnesia, MiraLax, etc) should be taken according to package directions if there are no bowel movements after 48 hours.  5. Watch out for diarrhea. If you have many loose bowel movements, simplify your diet to bland foods & liquids for a few days. Stop any stool softeners and decrease your fiber supplement. Switching to mild anti-diarrheal medications (Kayopectate, Pepto Bismol) can help. If this worsens or does not improve, please call usKorea6. Wash / shower every day. You may shower over the dressings as they are waterproof. Continue to shower over incision(s) after the dressing is off. 7. Remove your waterproof bandages 5 days after surgery. You may leave the incision open to air. You may replace a dressing/Band-Aid to cover the incision for comfort if you wish.  8. ACTIVITIES as tolerated:  1. You may resume regular (light) daily activities beginning the next day--such as daily self-care,  walking, climbing stairs--gradually increasing activities as tolerated. If you can walk 30 minutes without difficulty, it is safe to try more intense activity such as jogging, treadmill, bicycling, low-impact aerobics, swimming, etc. 2. Save the most intensive and strenuous activity for last such as sit-ups, heavy lifting,  contact sports, etc Refrain from any heavy lifting or straining until you are off narcotics for pain control.  3. DO NOT PUSH THROUGH PAIN. Let pain be your guide: If it hurts to do something, don't do it. Pain is your body warning you to avoid that activity for another week until the pain goes down. 4. You may drive when you are no longer taking prescription pain medication, you can comfortably wear a seatbelt, and you can safely maneuver your car and apply brakes. 5. You may have sexual intercourse when it is comfortable.  9. FOLLOW UP in our office  1. Please call [facility] at [phone] to set up an appointment to see your surgeon in the office for a follow-up appointment approximately 2-3 weeks after your surgery. 2. Make sure that you call for this appointment the day you arrive home to insure a convenient appointment time.      10. IF YOU HAVE DISABILITY OR FAMILY LEAVE FORMS, BRING THEM TO THE               OFFICE FOR PROCESSING.   WHEN TO CALL us [phone]:  1. Poor pain control 2. Reactions / problems with new medications (rash/itching, nausea, etc)  3. Fever over 101.5 F (38.5 C) 4. Inability to urinate 5. Nausea and/or vomiting 6. Worsening swelling or bruising 7. Continued bleeding from incision. 8. Increased pain, redness, or drainage from the incision  The clinic staff is available to answer your questions during regular business hours (8:30am-5pm). Please dont hesitate to call and ask to speak to one of our nurses for clinical concerns.  If you have a medical emergency, go to the nearest emergency room or call 911.  A surgeon from [facility] is always on call at the [facility], [place], [place], [place], [place] [address] ?  MAIN: [phone] ? TOLL FREE: [phone] ?  FAX [phone]  [email or web address]

## 2015-05-25 NOTE — Anesthesia Preprocedure Evaluation (Addendum)
Anesthesia Evaluation  Patient identified by MRN, date of birth, ID band Patient awake    Reviewed: Allergy & Precautions, NPO status , Patient's Chart, lab work & pertinent test results  Airway Mallampati: II  TM Distance: <3 FB Neck ROM: Full  Mouth opening: Limited Mouth Opening  Dental no notable dental hx. (+) Dental Advisory Given, Teeth Intact   Pulmonary neg pulmonary ROS,    Pulmonary exam normal breath sounds clear to auscultation       Cardiovascular negative cardio ROS Normal cardiovascular exam Rhythm:Regular Rate:Normal     Neuro/Psych negative neurological ROS  negative psych ROS   GI/Hepatic negative GI ROS, Neg liver ROS,   Endo/Other  Morbid obesity  Renal/GU negative Renal ROS  negative genitourinary   Musculoskeletal negative musculoskeletal ROS (+)   Abdominal (+) + obese,   Peds negative pediatric ROS (+)  Hematology negative hematology ROS (+)   Anesthesia Other Findings   Reproductive/Obstetrics negative OB ROS                            Anesthesia Physical Anesthesia Plan  ASA: II  Anesthesia Plan: General   Post-op Pain Management:    Induction: Intravenous  Airway Management Planned: Oral ETT  Additional Equipment:   Intra-op Plan:   Post-operative Plan: Extubation in OR  Informed Consent: I have reviewed the patients History and Physical, chart, labs and discussed the procedure including the risks, benefits and alternatives for the proposed anesthesia with the patient or authorized representative who has indicated his/her understanding and acceptance.   Dental advisory given  Plan Discussed with: CRNA and Surgeon  Anesthesia Plan Comments:         Anesthesia Quick Evaluation

## 2015-05-25 NOTE — Transfer of Care (Signed)
Immediate Anesthesia Transfer of Care Note  Patient: Abigail Golden  Procedure(s) Performed: Procedure(s): LAPAROSCOPIC CHOLECYSTECTOMY (N/A)  Patient Location: PACU  Anesthesia Type:General  Level of Consciousness: awake, alert  and oriented  Airway & Oxygen Therapy: Patient Spontanous Breathing and Patient connected to nasal cannula oxygen  Post-op Assessment: Report given to RN, Post -op Vital signs reviewed and stable and Patient moving all extremities X 4  Post vital signs: Reviewed and stable  Last Vitals:  Filed Vitals:   05/25/15 1000 05/25/15 1100  BP: 100/61 112/65  Pulse: 57 57  Temp:    Resp:      Complications: No apparent anesthesia complications

## 2015-05-25 NOTE — H&P (Signed)
Abigail Golden is an 49 y.o. female.   Chief Complaint: abd pain HPI: 49 yo morbidly obese female comes to the ED with c/o ongoing persistent epigastric/RUQ pain. She reports she has had several episodes since mid Nov. Always RUQ/epigastric area. No radiation. Described as squeezing pain, associated with nausea. Lasts several hours. Saw PCP last Friday. Had labs which pt states were normal. Given rx for reflux med which hasn't helped. Has been taking ibuprofen as needed for pain and phenergan for nausea. Regular BMs. No wt loss.   Past Medical History  Diagnosis Date  . Migraine   . Hypoglycemia     Past Surgical History  Procedure Laterality Date  . Abdominal hysterectomy      No family history on file. Social History:  reports that she has never smoked. She does not have any smokeless tobacco history on file. She reports that she does not drink alcohol or use illicit drugs.  Allergies:  Allergies  Allergen Reactions  . Celebrex [Celecoxib]   . Minocycline      (Not in a hospital admission)  Results for orders placed or performed during the hospital encounter of 05/24/15 (from the past 48 hour(s))  Urinalysis, Routine w reflex microscopic (not at [facility])     Status: None   Collection Time: 05/24/15  9:58 PM  Result Value Ref Range   Color, Urine YELLOW YELLOW   APPearance CLEAR CLEAR   Specific Gravity, Urine 1.016 1.005 - 1.030   pH 5.5 5.0 - 8.0   Glucose, UA NEGATIVE NEGATIVE mg/dL   Hgb urine dipstick NEGATIVE NEGATIVE   Bilirubin Urine NEGATIVE NEGATIVE   Ketones, ur NEGATIVE NEGATIVE mg/dL   Protein, ur NEGATIVE NEGATIVE mg/dL   Nitrite NEGATIVE NEGATIVE   Leukocytes, UA NEGATIVE NEGATIVE    Comment: MICROSCOPIC NOT DONE ON URINES WITH NEGATIVE PROTEIN, BLOOD, LEUKOCYTES, NITRITE, OR GLUCOSE <1000 mg/dL.  Lipase, blood     Status: None   Collection Time: 05/24/15 10:18 PM  Result Value Ref Range   Lipase 32 11 - 51 U/L  Comprehensive metabolic panel     Status:  Abnormal   Collection Time: 05/24/15 10:18 PM  Result Value Ref Range   Sodium 135 135 - 145 mmol/L   Potassium 4.2 3.5 - 5.1 mmol/L   Chloride 101 101 - 111 mmol/L   CO2 25 22 - 32 mmol/L   Glucose, Bld 119 (H) 65 - 99 mg/dL   BUN 10 6 - 20 mg/dL   Creatinine, Ser 0.80 0.44 - 1.00 mg/dL   Calcium 9.3 8.9 - 10.3 mg/dL   Total Protein 7.3 6.5 - 8.1 g/dL   Albumin 4.1 3.5 - 5.0 g/dL   AST 19 15 - 41 U/L   ALT 16 14 - 54 U/L   Alkaline Phosphatase 64 38 - 126 U/L   Total Bilirubin 0.5 0.3 - 1.2 mg/dL   GFR calc non Af Amer >60 >60 mL/min   GFR calc Af Amer >60 >60 mL/min    Comment: (NOTE) The eGFR has been calculated using the CKD EPI equation. This calculation has not been validated in all clinical situations. eGFR's persistently <60 mL/min signify possible Chronic Kidney Disease.    Anion gap 9 5 - 15  CBC     Status: None   Collection Time: 05/24/15 10:18 PM  Result Value Ref Range   WBC 7.7 4.0 - 10.5 K/uL   RBC 4.54 3.87 - 5.11 MIL/uL   Hemoglobin 13.4 12.0 - 15.0 g/dL  HCT 38.4 36.0 - 46.0 %   MCV 84.6 78.0 - 100.0 fL   MCH 29.5 26.0 - 34.0 pg   MCHC 34.9 30.0 - 36.0 g/dL   RDW 12.4 11.5 - 15.5 %   Platelets 279 150 - 400 K/uL   Ct Abdomen Pelvis W Contrast  05/25/2015  CLINICAL DATA:  49 year old female with diffuse abdominal pain x2 weeks. EXAM: CT ABDOMEN AND PELVIS WITH CONTRAST TECHNIQUE: Multidetector CT imaging of the abdomen and pelvis was performed using the standard protocol following bolus administration of intravenous contrast. CONTRAST:  167m OMNIPAQUE IOHEXOL 300 MG/ML  SOLN COMPARISON:  Ultrasound dated 05/25/2015 FINDINGS: The visualized lung bases are clear. No intra-abdominal free air or free fluid identified. The liver appears unremarkable. The gallbladder is mildly distended. There is mild apparent gallbladder wall thickening. A focal area of high attenuation noted within the body of the gallbladder which may represent sludge, small stones or focal  gallbladder wall thickening/adenomyomatosis. This was not clearly visualized on the ultrasound study. No significant pericholecystic fluid identified. Repeat gallbladder ultrasound or a hepatobiliary scintigraphy is recommended if there is clinical concern for gallbladder pathology. The pancreas, spleen, adrenal glands, kidneys, visualized ureters, and urinary bladder appear unremarkable. Hysterectomy. There is no evidence of bowel obstruction or inflammation. Normal appendix. The abdominal aorta and IVC appear patent. No portal venous gas identified. There is no lymphadenopathy. There is minimal stranding of the fat surrounding the celiac axis, nonspecific. There is mild mesentery stranding. Clinical correlation is recommended to evaluate for enteritis. The abdominal wall soft tissues appear unremarkable. There is mild degenerative changes of the spine. No acute fracture. IMPRESSION: Focal high attenuation within the gallbladder may represent sludge, small stones, or focal adenomyomatosis. There is apparent diffuse thickening of the gallbladder wall. Repeat ultrasound or a hepatobiliary scintigraphy is recommended for better evaluation of the gallbladder. The no evidence of bowel obstruction or inflammation. Normal appendix. Mild mesenteric stranding. Clinical correlation is recommended to evaluate for enteritis. Electronically Signed   By: AAnner CreteM.D.   On: 05/25/2015 03:39   UKoreaAbdomen Limited Ruq  05/25/2015  CLINICAL DATA:  Right upper quadrant pain EXAM: [place]oreaABDOMEN LIMITED - RIGHT UPPER QUADRANT COMPARISON:  None. FINDINGS: Gallbladder: No gallstones or wall thickening visualized. No sonographic Murphy sign noted. Common bile duct: Diameter: 4.3 mm Liver: Mildly irregular hepatic echotexture without focal mass. No intrahepatic bile duct dilatation. IMPRESSION: Normal gallbladder and bile ducts. Slightly irregular hepatic echotexture is nonspecific and could represent mild fatty infiltration or  other hepatocellular disease. Electronically Signed   By: DAndreas NewportM.D.   On: 05/25/2015 00:56    Review of Systems  Constitutional: Negative for weight loss.  HENT: Negative for nosebleeds.   Eyes: Negative for blurred vision.  Respiratory: Negative for shortness of breath.   Cardiovascular: Negative for chest pain, palpitations, orthopnea and PND.       Denies DOE  Gastrointestinal: Positive for heartburn, nausea and abdominal pain. Negative for diarrhea and constipation.  Genitourinary: Negative for dysuria and hematuria.  Musculoskeletal: Negative.   Skin: Negative for itching and rash.  Neurological: Negative for dizziness, focal weakness, seizures, loss of consciousness and headaches.       Denies TIAs, amaurosis fugax  Endo/Heme/Allergies: Does not bruise/bleed easily.  Psychiatric/Behavioral: The patient is not nervous/anxious.     Blood pressure 119/62, pulse 57, temperature 98 F (36.7 C), temperature source Oral, resp. rate 14, height '5\' 1"'  (1.549 m), weight 113.002 kg (249 lb 2 oz), SpO2  95 %. Physical Exam  Vitals reviewed. Constitutional: She is oriented to person, place, and time. She appears well-developed and well-nourished. No distress.  Morbidly obese  HENT:  Head: Normocephalic and atraumatic.  Right Ear: External ear normal.  Left Ear: External ear normal.  Eyes: Conjunctivae are normal. No scleral icterus.  Neck: Normal range of motion. Neck supple. No tracheal deviation present. No thyromegaly present.  Cardiovascular: Normal rate and normal heart sounds.   Respiratory: Effort normal and breath sounds normal. No stridor. No respiratory distress. She has no wheezes.  GI: Soft. She exhibits no distension. There is tenderness in the right upper quadrant and epigastric area. There is no rigidity, no rebound and no guarding.    Old low transverse incision; soft,   Musculoskeletal: She exhibits no edema or tenderness.  Lymphadenopathy:    She has no  cervical adenopathy.  Neurological: She is alert and oriented to person, place, and time. She exhibits normal muscle tone.  Skin: Skin is warm and dry. No rash noted. She is not diaphoretic. No erythema. No pallor.  Psychiatric: She has a normal mood and affect. Her behavior is normal. Judgment and thought content normal.     Assessment/Plan Cholecystitis Morbidly obesity   Physical exam, history, and imaging all suggestive of gallbladder etiology  Admit  ivf Iv abx scds OR later today with Dr Rosendo Gros for cholecystectomy. He will discuss cholecystectomy with pt.   Leighton Ruff. Redmond Pulling, MD, FACS General, Bariatric, & Minimally Invasive Surgery [facility], [place]     [facility] M 05/25/2015, 7:01 AM

## 2015-05-25 NOTE — ED Notes (Signed)
Redmond Pulling, MD at bedside

## 2015-05-26 MED ORDER — HYDROCODONE-ACETAMINOPHEN 5-325 MG PO TABS: 1.0000 | ORAL_TABLET | ORAL | Status: DC | PRN

## 2015-05-26 NOTE — Discharge Summary (Signed)
Physician Discharge Summary  Patient ID: Abigail Golden MRN: [id number] DOB/AGE: 1965-12-03 49 y.o.  Admit date: 05/24/2015 Discharge date: 05/26/2015  Admitting Diagnosis: Cholecystitis   Discharge Diagnosis Patient Active Problem List   Diagnosis Date Noted  . Cholecystitis 05/25/2015    Consultants none  Imaging: Ct Abdomen Pelvis W Contrast  05/25/2015  CLINICAL DATA:  49 year old female with diffuse abdominal pain x2 weeks. EXAM: CT ABDOMEN AND PELVIS WITH CONTRAST TECHNIQUE: Multidetector CT imaging of the abdomen and pelvis was performed using the standard protocol following bolus administration of intravenous contrast. CONTRAST:  12m OMNIPAQUE IOHEXOL 300 MG/ML  SOLN COMPARISON:  Ultrasound dated 05/25/2015 FINDINGS: The visualized lung bases are clear. No intra-abdominal free air or free fluid identified. The liver appears unremarkable. The gallbladder is mildly distended. There is mild apparent gallbladder wall thickening. A focal area of high attenuation noted within the body of the gallbladder which may represent sludge, small stones or focal gallbladder wall thickening/adenomyomatosis. This was not clearly visualized on the ultrasound study. No significant pericholecystic fluid identified. Repeat gallbladder ultrasound or a hepatobiliary scintigraphy is recommended if there is clinical concern for gallbladder pathology. The pancreas, spleen, adrenal glands, kidneys, visualized ureters, and urinary bladder appear unremarkable. Hysterectomy. There is no evidence of bowel obstruction or inflammation. Normal appendix. The abdominal aorta and IVC appear patent. No portal venous gas identified. There is no lymphadenopathy. There is minimal stranding of the fat surrounding the celiac axis, nonspecific. There is mild mesentery stranding. Clinical correlation is recommended to evaluate for enteritis. The abdominal wall soft tissues appear unremarkable. There is mild degenerative changes  of the spine. No acute fracture. IMPRESSION: Focal high attenuation within the gallbladder may represent sludge, small stones, or focal adenomyomatosis. There is apparent diffuse thickening of the gallbladder wall. Repeat ultrasound or a hepatobiliary scintigraphy is recommended for better evaluation of the gallbladder. The no evidence of bowel obstruction or inflammation. Normal appendix. Mild mesenteric stranding. Clinical correlation is recommended to evaluate for enteritis. Electronically Signed   By: AAnner CreteM.D.   On: 05/25/2015 03:39   [place]oreaAbdomen Limited Ruq  05/25/2015  CLINICAL DATA:  Right upper quadrant pain EXAM: [place]oreaABDOMEN LIMITED - RIGHT UPPER QUADRANT COMPARISON:  None. FINDINGS: Gallbladder: No gallstones or wall thickening visualized. No sonographic Murphy sign noted. Common bile duct: Diameter: 4.3 mm Liver: Mildly irregular hepatic echotexture without focal mass. No intrahepatic bile duct dilatation. IMPRESSION: Normal gallbladder and bile ducts. Slightly irregular hepatic echotexture is nonspecific and could represent mild fatty infiltration or other hepatocellular disease. Electronically Signed   By: DAndreas NewportM.D.   On: 05/25/2015 00:56    Procedures Laparoscopic cholecystectomy---Dr. RDanton ClapCourse:  [name] [facility] obese [facility].  Workup showed gallbladder disease. LFTs were normal. Patient was admitted and underwent procedure listed above.  Tolerated procedure well and was transferred to the floor.  Diet was advanced as tolerated.  On POD#1, the patient was voiding well, tolerating diet, ambulating well, pain well controlled, vital signs stable, incisions c/d/i and felt stable for discharge home.  Medication risks, benefits and therapeutic alternatives were reviewed with the patient.  She verbalizes understanding.  Patient will follow up in our office in 2 weeks and knows to call with questions or  concerns.  Physical Exam: General:  Alert, NAD, pleasant, comfortable Abd:  Soft, ND, mild tenderness, incisions C/D/I    Medication List    TAKE these medications  estradiol 2 MG tablet  Commonly known as:  ESTRACE  Take 2 mg by mouth daily.     HYDROcodone-acetaminophen 5-325 MG tablet  Commonly known as:  NORCO/VICODIN  Take 1-2 tablets by mouth every 4 (four) hours as needed for moderate pain or severe pain.     hydrOXYzine 25 MG tablet  Commonly known as:  ATARAX/VISTARIL  Take 25 mg by mouth 3 (three) times daily as needed for itching.     ibuprofen 200 MG tablet  Commonly known as:  ADVIL,MOTRIN  Take 200 mg by mouth every 6 (six) hours as needed for pain.     montelukast 10 MG tablet  Commonly known as:  SINGULAIR  Take 10 mg by mouth at bedtime.             Follow-up Information    Follow up with [facility]. Go on 06/13/2015.   Specialty:  General Surgery   Why:  For post-operation check.  Your appointment is at 9:45am, please arrive at least 67mn before your appointment to complete your check in paperwork.  If you are unable to arrive 365m prior to your appointment time we may have to cancel or reschedule you.   Contact information:   [id number]    Signed: EmErby PianANSwisher Memorial Hospitalurgery [id number]/08/2014, 8:32 AM

## 2015-05-26 NOTE — Progress Notes (Signed)
Discharge instructions gone over with patient. Home medications gone over. Prescription given. Follow up appointment is made. Diet, activity, and incisional care gone over. Reasons to call the doctor gone over. My chart discussed. Work not given. Patient verbalized understanding of instructions.

## 2015-05-28 ENCOUNTER — Encounter (HOSPITAL_COMMUNITY): Payer: Self-pay | Admitting: General Surgery

## 2015-12-18 ENCOUNTER — Other Ambulatory Visit: Payer: Self-pay | Admitting: Obstetrics and Gynecology

## 2015-12-18 DIAGNOSIS — Z1231 Encounter for screening mammogram for malignant neoplasm of breast: Secondary | ICD-10-CM

## 2016-02-11 ENCOUNTER — Ambulatory Visit
Admission: RE | Admit: 2016-02-11 | Discharge: 2016-02-11 | Disposition: A | Discharge status: Home / Self Care | Payer: BC Managed Care – PPO | Source: Ambulatory Visit | Attending: Obstetrics and Gynecology | Admitting: Obstetrics and Gynecology

## 2016-02-11 DIAGNOSIS — Z1231 Encounter for screening mammogram for malignant neoplasm of breast: Secondary | ICD-10-CM

## 2017-01-12 ENCOUNTER — Other Ambulatory Visit: Payer: Self-pay | Admitting: Certified Nurse Midwife

## 2017-01-12 DIAGNOSIS — Z1231 Encounter for screening mammogram for malignant neoplasm of breast: Secondary | ICD-10-CM

## 2017-02-11 ENCOUNTER — Ambulatory Visit
Admission: RE | Admit: 2017-02-11 | Discharge: 2017-02-11 | Disposition: A | Discharge status: Home / Self Care | Payer: BC Managed Care – PPO | Source: Ambulatory Visit | Attending: Certified Nurse Midwife | Admitting: Certified Nurse Midwife

## 2017-02-11 DIAGNOSIS — Z1231 Encounter for screening mammogram for malignant neoplasm of breast: Secondary | ICD-10-CM

## 2017-02-26 ENCOUNTER — Ambulatory Visit (INDEPENDENT_AMBULATORY_CARE_PROVIDER_SITE_OTHER): Payer: BC Managed Care – PPO

## 2017-02-26 ENCOUNTER — Encounter (HOSPITAL_COMMUNITY): Payer: Self-pay | Admitting: Emergency Medicine

## 2017-02-26 ENCOUNTER — Ambulatory Visit (HOSPITAL_COMMUNITY)
Admission: EM | Admit: 2017-02-26 | Discharge: 2017-02-26 | Disposition: A | Discharge status: Home / Self Care | Payer: BC Managed Care – PPO | Attending: Internal Medicine | Admitting: Internal Medicine

## 2017-02-26 DIAGNOSIS — M238X1 Other internal derangements of right knee: Secondary | ICD-10-CM | POA: Diagnosis not present

## 2017-02-26 MED ORDER — HYDROCODONE-ACETAMINOPHEN 5-325 MG PO TABS
1.0000 | ORAL_TABLET | Freq: Once | ORAL | Status: AC
  Administered 2017-02-26: 1 via ORAL

## 2017-02-26 MED ORDER — HYDROCODONE-ACETAMINOPHEN 5-325 MG PO TABS
ORAL_TABLET | ORAL | Status: AC
  Filled 2017-02-26: qty 1

## 2017-02-26 MED ORDER — HYDROCODONE-ACETAMINOPHEN 5-325 MG PO TABS: 1.0000 | ORAL_TABLET | ORAL | 0 refills | Status: DC | PRN

## 2017-02-26 NOTE — Discharge Instructions (Signed)
No fractures or dislocations were seen on x-ray. You most likely have an injury to one of the ligaments within your knee. Recommend rest, ice, elevation, we placed a splint on her knee. Recommend over-the-counter anti-inflammatories to reduce the swelling such as ibuprofen. I have also prescribed a medicine for pain called hydrocodone, this medicine is a narcotic, it will cause drowsiness, and it is addictive. Do not take more than what is necessary, do not drink alcohol while taking, and do not operate any heavy machinery while taking this medicine.   Follow-up with [facility] for further evaluation and management of your injuries

## 2017-02-26 NOTE — ED Triage Notes (Addendum)
Right knee pain for 3 weeks.  Patient did see pcp and had x-rays 2 weeks ago.  Today felt "something pop" patient is significant now.  Pain is medial knee.    Patient was sitting on a rolling chair and pushed off to maneuver chair and that is when pop occured

## 2017-02-26 NOTE — ED Provider Notes (Signed)
[place]   [id number] 02/26/17 Arrival Time: 1133   SUBJECTIVE:  Abigail Golden is a 51 y.o. female who presents to the urgent care with complaint of pain to her right knee. She had an injury to her knee 2 weeks ago, was evaluated at her primary care office, had imaging done, that was normal. Most likely diagnosis at that time was a strain. She had minimal relief with the treatment, however today she reports that she was attempting to stand out of a chair when she felt a "pop" in her knee and significant pain. States the pain is worse now than it was at any point with her previous injury. And that she is unable to bear weight on it.     Past Medical History:  Diagnosis Date  . Anxiety   . Chronic bronchitis ([place])   . Depression   . Hypoglycemia   . Lower back pain    since fall ~ 2012 (05/25/2015)  . Migraine    05/25/2015 "probably once/month"  . Pain in left hip    since fall ~ 2012 (05/25/2015)  . Pneumonia "several times"   Family History  Problem Relation Age of Onset  . Breast cancer Neg Hx    Social History   Social History  . Marital status: Single    Spouse name: N/A  . Number of children: N/A  . Years of education: N/A   Occupational History  . Not on file.   Social History Main Topics  . Smoking status: Never Smoker  . Smokeless tobacco: Never Used  . Alcohol use No  . Drug use: No  . Sexual activity: Not on file   Other Topics Concern  . Not on file   Social History Narrative  . No narrative on file   Current Meds  Medication Sig  . ibuprofen (ADVIL,MOTRIN) 200 MG tablet Take 200 mg by mouth every 6 (six) hours as needed for pain.   Allergies  Allergen Reactions  . Celebrex [Celecoxib]   . Minocycline       ROS: As per HPI, remainder of ROS negative.   OBJECTIVE:   Vitals:   02/26/17 1219  BP: 137/60  Pulse: 80  Resp: 18  Temp: 98.1 F (36.7 C)  TempSrc: Oral  SpO2: 100%     General appearance: alert; no  distress Eyes: PERRL; EOMI; conjunctiva normal HENT: normocephalic; atraumatic; TMs normal, canal normal, external ears normal without trauma; nasal mucosa normal; oral mucosa normal Extremities: no cyanosis or edema; symmetrical with no gross deformities, no patellar instability, negative ballottement, there is pain with palpation along the MCL, and pain with valgus stress. Negative drawer sign Skin: warm and dry Neurologic: normal gait; grossly normal Psychological: alert and cooperative; normal mood and affect      Labs: Labs Reviewed - No data to display  Dg Knee Complete 4 Views Right  Result Date: 02/26/2017 CLINICAL DATA:  Initial evaluation for acute pain with movement and bearing weight. EXAM: RIGHT KNEE - COMPLETE 4+ VIEW COMPARISON:  Prior radiograph from 02/12/2017. FINDINGS: No acute fracture dislocation. Probable small joint effusion within the suprapatellar recess. Joint spaces maintained without evidence for significant degenerative or erosive arthropathy. Suspected small benign cortical defect at the posterior aspect of the distal right femur. IMPRESSION: 1. No acute osseous abnormality about the right knee. 2. Small joint effusion. Electronically Signed   By: Jeannine Boga M.D.   On: 02/26/2017 13:37       ASSESSMENT & PLAN:  1. Mcl deficiency, knee, right     Meds ordered this encounter  Medications  . HYDROcodone-acetaminophen (NORCO/VICODIN) 5-325 MG per tablet 1 tablet  . HYDROcodone-acetaminophen (NORCO/VICODIN) 5-325 MG tablet    Sig: Take 1 tablet by mouth every 4 (four) hours as needed.    Dispense:  15 tablet    Refill:  0    Order Specific Question:   Supervising Provider    Answer:   Sherlene Shams [[id number]]    [facility] controlled substances reporting system consulted prior to issuing prescription, with the following findings below:  1 prescription for benzodiazepine was issued in the month of July, no other controlled substances were  written in the last 6 months  Recommend RICE therapy, we'll place knee in a knee immobilizer, recommend anti-inflammatories, short course of Vicodin for break through pain. Follow up with orthopedics.    Reviewed expectations re: course of current medical issues. Questions answered. Outlined signs and symptoms indicating need for more acute intervention. Patient verbalized understanding. After Visit Summary given.    Procedures:        Barnet Glasgow, NP 02/26/17 1411

## 2017-02-27 ENCOUNTER — Encounter (HOSPITAL_COMMUNITY): Payer: Self-pay | Admitting: Emergency Medicine

## 2017-02-27 ENCOUNTER — Emergency Department (HOSPITAL_COMMUNITY)
Admission: EM | Admit: 2017-02-27 | Discharge: 2017-02-28 | Disposition: A | Discharge status: Home / Self Care | Payer: BC Managed Care – PPO | Attending: Emergency Medicine | Admitting: Emergency Medicine

## 2017-02-27 DIAGNOSIS — R55 Syncope and collapse: Secondary | ICD-10-CM

## 2017-02-27 DIAGNOSIS — M25561 Pain in right knee: Secondary | ICD-10-CM | POA: Insufficient documentation

## 2017-02-27 DIAGNOSIS — Z79899 Other long term (current) drug therapy: Secondary | ICD-10-CM | POA: Diagnosis not present

## 2017-02-27 LAB — CBC
HCT: 36 % (ref 36.0–46.0)
Hemoglobin: 12.1 g/dL (ref 12.0–15.0)
MCH: 28.5 pg (ref 26.0–34.0)
MCHC: 33.6 g/dL (ref 30.0–36.0)
MCV: 84.7 fL (ref 78.0–100.0)
Platelets: 198 10*3/uL (ref 150–400)
RBC: 4.25 MIL/uL (ref 3.87–5.11)
RDW: 12.6 % (ref 11.5–15.5)
WBC: 6.3 10*3/uL (ref 4.0–10.5)

## 2017-02-27 LAB — BASIC METABOLIC PANEL
ANION GAP: 8 (ref 5–15)
BUN: 12 mg/dL (ref 6–20)
CHLORIDE: 106 mmol/L (ref 101–111)
CO2: 23 mmol/L (ref 22–32)
Calcium: 8.9 mg/dL (ref 8.9–10.3)
Creatinine, Ser: 0.83 mg/dL (ref 0.44–1.00)
Glucose, Bld: 127 mg/dL — ABNORMAL HIGH (ref 65–99)
POTASSIUM: 3.8 mmol/L (ref 3.5–5.1)
SODIUM: 137 mmol/L (ref 135–145)

## 2017-02-27 LAB — CBG MONITORING, ED: Glucose-Capillary: 129 mg/dL — ABNORMAL HIGH (ref 65–99)

## 2017-02-27 MED ORDER — SODIUM CHLORIDE 0.9 % IV BOLUS (SEPSIS)
1000.0000 mL | Freq: Once | INTRAVENOUS | Status: AC
  Administered 2017-02-28: 1000 mL via INTRAVENOUS

## 2017-02-27 NOTE — ED Provider Notes (Signed)
[facility] DEPT Provider Note   CSN: [id number] Arrival date & time: 02/27/17  2214     History   Chief Complaint Chief Complaint  Patient presents with  . Loss of Consciousness    HPI Abigail Golden is a 51 y.o. female.  51 yo F with a chief complaint of a syncopal event. The patient has recently injured her knee has an MRI scheduled for tomorrow. Is been causing her quite a bit of disability and she has been having trouble getting around the house. She is having some significant pain was about to take a pain pill and sat on her stool when she felt like she is to pass out and then did. She struck the back of her head when this happened. Denies any vomiting denies chest pain or shortness of breath denies headache or neck pain. The patient feels better at this time. Complaining mostly of right knee pain. Denies new injury to the knee.   The history is provided by the patient.  Illness  This is a new problem. The current episode started 1 to 2 hours ago. The problem occurs constantly. The problem has not changed since onset.Pertinent negatives include no chest pain, no headaches and no shortness of breath. Nothing aggravates the symptoms. Nothing relieves the symptoms. She has tried nothing for the symptoms. The treatment provided no relief.    Past Medical History:  Diagnosis Date  . Anxiety   . Chronic bronchitis ([place])   . Depression   . Hypoglycemia   . Lower back pain    since fall ~ 2012 (05/25/2015)  . Migraine    05/25/2015 "probably once/month"  . Pain in left hip    since fall ~ 2012 (05/25/2015)  . Pneumonia "several times"    Patient Active Problem List   Diagnosis Date Noted  . Cholecystitis 05/25/2015    Past Surgical History:  Procedure Laterality Date  . ABDOMINAL HYSTERECTOMY  11/2002   "found benign tumors"  . CHOLECYSTECTOMY N/A 05/25/2015   Procedure: LAPAROSCOPIC CHOLECYSTECTOMY;  Surgeon: Ralene Ok, MD;  Location: [facility];  Service: General;   Laterality: N/A;  . LAPAROSCOPIC CHOLECYSTECTOMY  05/25/2015    OB History    No data available       Home Medications    Prior to Admission medications   Medication Sig Start Date End Date Taking? Authorizing Provider  estradiol (ESTRACE) 2 MG tablet Take 2 mg by mouth daily.   Yes [provider]  HYDROcodone-acetaminophen (NORCO/VICODIN) 5-325 MG tablet Take 1 tablet by mouth every 4 (four) hours as needed. 02/26/17  Yes Barnet Glasgow, NP  hydrOXYzine (ATARAX/VISTARIL) 25 MG tablet Take 25 mg by mouth 3 (three) times daily as needed for itching.   Yes [provider]  ibuprofen (ADVIL,MOTRIN) 200 MG tablet Take 800 mg by mouth every 6 (six) hours as needed for mild pain.    Yes [provider]  loratadine (CLARITIN) 10 MG tablet Take 10 mg by mouth daily.   Yes [provider]  montelukast (SINGULAIR) 10 MG tablet Take 10 mg by mouth at bedtime.   Yes [provider]  solifenacin (VESICARE) 10 MG tablet Take 10 mg by mouth every evening.   Yes [provider]    Family History Family History  Problem Relation Age of Onset  . Breast cancer Neg Hx     Social History Social History  Substance Use Topics  . Smoking status: Never Smoker  . Smokeless tobacco: Never Used  .  Alcohol use No     Allergies   Celebrex [celecoxib] and Minocycline   Review of Systems Review of Systems  Constitutional: Negative for chills and fever.  HENT: Negative for congestion and rhinorrhea.   Eyes: Negative for redness and visual disturbance.  Respiratory: Negative for shortness of breath and wheezing.   Cardiovascular: Negative for chest pain and palpitations.  Gastrointestinal: Negative for nausea and vomiting.  Genitourinary: Negative for dysuria and urgency.  Musculoskeletal: Positive for arthralgias and myalgias.  Skin: Negative for pallor and wound.  Neurological: Positive for syncope. Negative for dizziness and headaches.      Physical Exam Updated Vital Signs BP 121/62   Pulse 66   Temp 97.6 F (36.4 C) (Oral)   Resp 13   Ht 5\' 1"  (1.549 m)   Wt 122.5 kg (270 lb)   SpO2 97%   BMI 51.02 kg/m   Physical Exam  Constitutional: She is oriented to person, place, and time. She appears well-developed and well-nourished. No distress.  HENT:  Head: Normocephalic and atraumatic.  TM normal bilaterally  Eyes: Pupils are equal, round, and reactive to light. EOM are normal.  Neck: Normal range of motion. Neck supple.  Cardiovascular: Normal rate and regular rhythm.  Exam reveals no gallop and no friction rub.   No murmur heard. Pulmonary/Chest: Effort normal. She has no wheezes. She has no rales.  Abdominal: Soft. She exhibits no distension and no mass. There is no tenderness. There is no rebound and no guarding.  Musculoskeletal: She exhibits no edema or tenderness.  Knee immobilizer to right knee.  PMS intact distally BLE  Neurological: She is alert and oriented to person, place, and time. No cranial nerve deficit or sensory deficit. GCS eye subscore is 4. GCS verbal subscore is 5. GCS motor subscore is 6.  Skin: Skin is warm and dry. She is not diaphoretic.  Psychiatric: She has a normal mood and affect. Her behavior is normal.  Nursing note and vitals reviewed.    ED Treatments / Results  Labs (all labs ordered are listed, but only abnormal results are displayed) Labs Reviewed  BASIC METABOLIC PANEL - Abnormal; Notable for the following:       Result Value   Glucose, Bld 127 (*)    All other components within normal limits  CBG MONITORING, ED - Abnormal; Notable for the following:    Glucose-Capillary 129 (*)    All other components within normal limits  CBC  URINALYSIS, ROUTINE W REFLEX MICROSCOPIC    EKG  EKG Interpretation  Date/Time:  Friday February 27 2017 22:33:52 EDT Ventricular Rate:  69 PR Interval:    QRS Duration: 102 QT Interval:  417 QTC Calculation: 447 R  Axis:   69 Text Interpretation:  Sinus rhythm non specific st changes in III no wpw, prolonged qt or brugada No old tracing to compare Confirmed by Deno Etienne [id number]) on 02/27/2017 11:01:13 PM       Radiology Dg Knee Complete 4 Views Right  Result Date: 02/26/2017 CLINICAL DATA:  Initial evaluation for acute pain with movement and bearing weight. EXAM: RIGHT KNEE - COMPLETE 4+ VIEW COMPARISON:  Prior radiograph from 02/12/2017. FINDINGS: No acute fracture dislocation. Probable small joint effusion within the suprapatellar recess. Joint spaces maintained without evidence for significant degenerative or erosive arthropathy. Suspected small benign cortical defect at the posterior aspect of the distal right femur. IMPRESSION: 1. No acute osseous abnormality about the right knee. 2. Small joint effusion. Electronically Signed  By: Jeannine Boga M.D.   On: 02/26/2017 13:37    Procedures Procedures (including critical care time)  Medications Ordered in ED Medications  sodium chloride 0.9 % bolus 1,000 mL (1,000 mLs Intravenous New Bag/Given 02/28/17 0020)     Initial Impression / Assessment and Plan / ED Course  I have reviewed the triage vital signs and the nursing notes.  Pertinent labs & imaging results that were available during my care of the patient were reviewed by me and considered in my medical decision making (see chart for details).     51 yo F With a chief complaint of a syncopal event. Sounds vasovagal by history. Labs ordered in triage. Will give a fluid bolus.  D/c home.   12:22 AM:  I have discussed the diagnosis/risks/treatment options with the patient and family and believe the pt to be eligible for discharge home to follow-up with PCP. We also discussed returning to the ED immediately if new or worsening sx occur. We discussed the sx which are most concerning (e.g., sudden worsening pain, fever, inability to tolerate by mouth) that necessitate immediate return.  Medications administered to the patient during their visit and any new prescriptions provided to the patient are listed below.  Medications given during this visit Medications  sodium chloride 0.9 % bolus 1,000 mL (1,000 mLs Intravenous New Bag/Given 02/28/17 0020)     The patient appears reasonably screen and/or stabilized for discharge and I doubt any other medical condition or other [facility] requiring further screening, evaluation, or treatment in the ED at this time prior to discharge.    Final Clinical Impressions(s) / ED Diagnoses   Final diagnoses:  Syncope and collapse  Acute pain of right knee    New Prescriptions New Prescriptions   No medications on file     Deno Etienne, DO 02/28/17 0022

## 2017-02-27 NOTE — ED Triage Notes (Signed)
Per [facility], Pt from home. Pt reports feeling nauseous and feeling like she might pass out wile sitting on stool. Pt lost consciousness and fell backwards. Pt states she hit the back of her head on floor. Pt was seen at [facility] yesterday for R knee pain, was given Norco (last took at 1200). Pt alert and oriented, complaining of headache 5/10. VSS

## 2017-02-28 NOTE — ED Notes (Signed)
Waiting for IV fluids to finis before discharge

## 2017-02-28 NOTE — Discharge Instructions (Signed)
Eat and drink well for the next couple of days.   Return for repeat episode, chest pain, sob.

## 2017-03-23 HISTORY — PX: MENISCUS REPAIR: SHX5179

## 2017-04-14 ENCOUNTER — Ambulatory Visit: Payer: BC Managed Care – PPO | Attending: Specialist | Admitting: Physical Therapy

## 2017-04-14 ENCOUNTER — Encounter: Payer: Self-pay | Admitting: Physical Therapy

## 2017-04-14 DIAGNOSIS — M25661 Stiffness of right knee, not elsewhere classified: Secondary | ICD-10-CM | POA: Diagnosis present

## 2017-04-14 DIAGNOSIS — R262 Difficulty in walking, not elsewhere classified: Secondary | ICD-10-CM | POA: Insufficient documentation

## 2017-04-14 DIAGNOSIS — M25561 Pain in right knee: Secondary | ICD-10-CM

## 2017-04-14 DIAGNOSIS — M6281 Muscle weakness (generalized): Secondary | ICD-10-CM | POA: Diagnosis present

## 2017-04-14 NOTE — Therapy (Signed)
[facility] [place], [place], [address] Phone: [phone]   Fax:  [phone]  Physical Therapy Evaluation  Patient Details  Name: Abigail Golden MRN: [id number] Date of Birth: January 16, 1966 Referring Provider: Susa Day, MD  Encounter Date: 04/14/2017      PT End of Session - 04/14/17 1059    Visit Number 1   Number of Visits 13   Authorization Type BCBS   PT Start Time 1761   PT Stop Time 1130   PT Time Calculation (min) 50 min   Activity Tolerance Patient tolerated treatment well   Behavior During Therapy [facility] for tasks assessed/performed      Past Medical History:  Diagnosis Date  . Anxiety   . Chronic bronchitis ([place])   . Depression   . Hypoglycemia   . Lower back pain    since fall ~ 2012 (05/25/2015)  . Migraine    05/25/2015 "probably once/month"  . Pain in left hip    since fall ~ 2012 (05/25/2015)  . Pneumonia "several times"    Past Surgical History:  Procedure Laterality Date  . ABDOMINAL HYSTERECTOMY  11/2002   "found benign tumors"  . CHOLECYSTECTOMY N/A 05/25/2015   Procedure: LAPAROSCOPIC CHOLECYSTECTOMY;  Surgeon: Ralene Ok, MD;  Location: [facility];  Service: General;  Laterality: N/A;  . LAPAROSCOPIC CHOLECYSTECTOMY  05/25/2015    There were no vitals filed for this visit.       Subjective Assessment - 04/14/17 1050    Subjective Pt reporting right knee injury on 02/07/17 when walking and carrying a box, pt twisted and heard a pop. pt reporting history of R ankle weakness and pt arriving wearing an ankle stablizer. 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 Pt had previously seen her MD after the injury on 02/07/17 and X-rays were performed, no fracture was evident. Pt presenting today with medial meniscus tear s/p surgery on 03/23/17.    Pertinent  History history of left side back injury   Limitations House hold activities;Writing;Walking   How long can you sit comfortably? 20 minutes   How long can you stand comfortably? 10 minutes   How long can you walk comfortably? 5 minutes   Diagnostic tests MRI   Patient Stated Goals Get back to work, walk without crutch   Currently in Pain? Yes   Pain Score 3    Pain Location Knee   Pain Orientation Right   Pain Descriptors / Indicators Aching   Pain Type Surgical pain;Acute pain   Pain Onset 1 to 4 weeks ago   Pain Frequency Constant   Aggravating Factors  bending, walking, sleeping positions   Pain Relieving Factors over the counter pain meds, ice   Effect of Pain on Daily Activities difficutly with walking and ADL's, unable to work            [facility] PT Assessment - 04/14/17 0001      Assessment   Medical Diagnosis medial meniscus repair on right knee   Referring Provider Susa Day, MD   Onset Date/Surgical Date 03/23/17   Hand Dominance Right   Prior Therapy no     Precautions   Precautions None     Restrictions   Weight Bearing Restrictions No     Balance Screen   Has the patient fallen in  the past 6 months No   Is the patient reluctant to leave their home because of a fear of falling?  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     Cognition   Overall Cognitive Status Within Functional Limits for tasks assessed     Observation/Other Assessments   Focus on Therapeutic Outcomes (FOTO)  clinical assessment used for assessment     ROM / Strength   AROM / PROM / Strength AROM;Strength     AROM   AROM Assessment Site Knee   Right/Left Knee Right   Right Knee Extension 5    Right Knee Flexion 85     Strength   Overall Strength Deficits   Strength Assessment Site Knee   Right/Left Knee Right   Right Knee Flexion 3+/5   Right Knee Extension 3+/5     Palpation   Patella mobility limited by swelling and pain   Palpation comment tenderness over medial knee, pt with one steristrip bandage left.      Ambulation/Gait   Ambulation/Gait Yes   Ambulation/Gait Assistance 6: Modified independent (Device/Increase time)   Ambulation Distance (Feet) 20 Feet   Assistive device L Axillary Crutch   Gait Pattern Step-to pattern;Antalgic            Objective measurements completed on examination: See above findings.                  PT Education - 04/14/17 1058    Education provided Yes   Education Details HEP   Person(s) Educated Patient   Methods Explanation;Demonstration;Handout   Comprehension Verbalized understanding;Returned demonstration             PT Long Term Goals - 04/14/17 1316      PT LONG TERM GOAL #1   Title Pt will be independent in her HEP and progression.   Time 8   Period Weeks   Status New   Target Date 06/09/17     PT LONG TERM GOAL #2   Title Pt will be able to demonstrate >/= 4/5 strength in right knee to improve functional mobilty and gait.   Time 8   Period Weeks   Status New   Target Date 06/09/17     PT LONG TERM GOAL #3   Title Pt will be able to deomonstrate step through gait pattern with no assistive device.    Time 8   Period Weeks   Status New   Target Date 06/09/17                Plan - 04/14/17 1101    Clinical Impression Statement Pt presenting s/p R medial menisus repair on 03/23/17. Pt amb on single axillary crutch with antalgic gait with step to gait pattern. Pt was issued a HEP and E-stim applied, pt tolerated well. Skilled PT needed to progress pt toward her LTG's set.    Clinical Presentation Stable   Clinical Decision Making Low   Rehab Potential Excellent   PT Frequency  2x / week   PT Duration 6 weeks   PT Treatment/Interventions ADLs/Self Care Home Management;Electrical Stimulation;Cryotherapy;Ultrasound;Passive range of motion;Balance training;Therapeutic exercise;Therapeutic activities;Functional mobility training;Stair training;Gait  training;Patient/family education;Manual techniques;Taping   PT Next Visit Plan Nustep, Knee strengthening, gait training   PT Home Exercise Plan Quad sets   Consulted and Agree with Plan of Care Patient      Patient will benefit from skilled therapeutic intervention in order to improve the following deficits and impairments:  Abnormal gait, Postural dysfunction, Decreased range of motion, Pain, Decreased mobility, Difficulty walking, Decreased strength  Visit Diagnosis: Acute pain of right knee  Stiffness of right knee, not elsewhere classified  Difficulty in walking, not elsewhere classified  Muscle weakness (generalized)     Problem List Patient Active Problem List   Diagnosis Date Noted  . Cholecystitis 05/25/2015    Oretha Caprice, MPT 04/14/2017, 2:10 PM  [facility] [place], [place], [address] Phone: [phone]   Fax:  [phone]  Name: Abigail Golden MRN: [id number] Date of Birth: 09-May-1966

## 2017-04-16 ENCOUNTER — Ambulatory Visit: Payer: BC Managed Care – PPO | Admitting: *Deleted

## 2017-04-16 DIAGNOSIS — R262 Difficulty in walking, not elsewhere classified: Secondary | ICD-10-CM

## 2017-04-16 DIAGNOSIS — M25561 Pain in right knee: Secondary | ICD-10-CM

## 2017-04-16 DIAGNOSIS — M25661 Stiffness of right knee, not elsewhere classified: Secondary | ICD-10-CM

## 2017-04-16 DIAGNOSIS — M6281 Muscle weakness (generalized): Secondary | ICD-10-CM

## 2017-04-16 NOTE — Therapy (Signed)
[facility] [place], [place], [address] Phone: [phone]   Fax:  [phone]  Physical Therapy Treatment  Patient Details  Name: Abigail Golden MRN: [id number] Date of Birth: 1965/08/14 Referring Provider: Susa Day, MD  Encounter Date: 04/16/2017      PT End of Session - 04/16/17 1534    Visit Number 2   Number of Visits 13   Authorization Type BCBS   PT Start Time 0630   PT Stop Time 1612   PT Time Calculation (min) 57 min      Past Medical History:  Diagnosis Date  . Anxiety   . Chronic bronchitis ([place])   . Depression   . Hypoglycemia   . Lower back pain    since fall ~ 2012 (05/25/2015)  . Migraine    05/25/2015 "probably once/month"  . Pain in left hip    since fall ~ 2012 (05/25/2015)  . Pneumonia "several times"    Past Surgical History:  Procedure Laterality Date  . ABDOMINAL HYSTERECTOMY  11/2002   "found benign tumors"  . CHOLECYSTECTOMY N/A 05/25/2015   Procedure: LAPAROSCOPIC CHOLECYSTECTOMY;  Surgeon: Ralene Ok, MD;  Location: [facility];  Service: General;  Laterality: N/A;  . LAPAROSCOPIC CHOLECYSTECTOMY  05/25/2015    There were no vitals filed for this visit.      Subjective Assessment - 04/16/17 1533    Subjective Pt reporting right knee injury on 02/07/17 when walking and carrying a box, pt twisted and heard a pop. pt reporting history of R ankle weakness and pt arriving wearing an ankle stablizer. 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 Pt had previously seen her MD after the injury on 02/07/17 and X-rays were performed, no fracture was evident. Pt presenting today with medial meniscus tear s/p surgery on 03/23/17.                          [facility] Adult PT Treatment/Exercise - 04/16/17 0001      Exercises   Exercises Knee/Hip      Knee/Hip Exercises: Aerobic   Recumbent Bike  L4 x 15 mins seat 10     Knee/Hip Exercises: Seated   Long Arc Quad Right;3 sets;10 reps     Knee/Hip Exercises: Supine   Quad Sets AROM;Right;2 sets;10 reps   Short Arc [employer] AROM;Right;2 sets;10 reps   Heel Slides Right;1 set;10 reps   Straight Leg Raises AROM;AAROM;2 sets;10 reps     Modalities   Modalities Electrical Stimulation;Vasopneumatic     Electrical Stimulation   Electrical Stimulation Location RT knee IFC x15 mins 80-150 hz    Electrical Stimulation Goals Pain     Vasopneumatic   Number Minutes Vasopneumatic  15 minutes   Vasopnuematic Location  Knee   Vasopneumatic Pressure Medium   Vasopneumatic Temperature  36                     PT Long Term Goals - 04/14/17 1316      PT LONG TERM GOAL #1   Title Pt will be independent in her HEP and progression.   Time 8   Period Weeks   Status New   Target Date 06/09/17     PT LONG TERM GOAL #2   Title Pt  will be able to demonstrate >/= 4/5 strength in right knee to improve functional mobilty and gait.   Time 8   Period Weeks   Status New   Target Date 06/09/17     PT LONG TERM GOAL #3   Title Pt will be able to deomonstrate step through gait pattern with no assistive device.    Time 8   Period Weeks   Status New   Target Date 06/09/17               Plan - 04/16/17 1534    Clinical Impression Statement Pt arrived today ambulating with 1 crutch and antalgic gait pattern. Pt 4-5/10 and is apprehensive at times with flexion and extension transitions. She did well with LAQ and SAQ, but needed assistance with SLR. Normal modality responses   Rehab Potential Excellent   PT Frequency 2x / week   PT Duration 6 weeks   PT Treatment/Interventions ADLs/Self Care Home Management;Electrical Stimulation;Cryotherapy;Ultrasound;Passive range of motion;Balance training;Therapeutic exercise;Therapeutic activities;Functional mobility training;Stair  training;Gait training;Patient/family education;Manual techniques;Taping   PT Next Visit Plan Nustep, Knee strengthening, gait training   PT Home Exercise Plan Quad sets      Patient will benefit from skilled therapeutic intervention in order to improve the following deficits and impairments:  Abnormal gait, Postural dysfunction, Decreased range of motion, Pain, Decreased mobility, Difficulty walking, Decreased strength  Visit Diagnosis: Acute pain of right knee  Stiffness of right knee, not elsewhere classified  Difficulty in walking, not elsewhere classified  Muscle weakness (generalized)     Problem List Patient Active Problem List   Diagnosis Date Noted  . Cholecystitis 05/25/2015    Tya Haughey,CHRIS, PTA 04/16/2017, 4:26 PM  [facility] [place], [place], [address] Phone: [phone]   Fax:  [phone]  Name: Abigail Golden MRN: [id number] Date of Birth: 04/22/1966

## 2017-04-21 ENCOUNTER — Ambulatory Visit: Payer: BC Managed Care – PPO | Admitting: *Deleted

## 2017-04-21 DIAGNOSIS — M25661 Stiffness of right knee, not elsewhere classified: Secondary | ICD-10-CM

## 2017-04-21 DIAGNOSIS — M6281 Muscle weakness (generalized): Secondary | ICD-10-CM

## 2017-04-21 DIAGNOSIS — M25561 Pain in right knee: Secondary | ICD-10-CM | POA: Diagnosis not present

## 2017-04-21 DIAGNOSIS — R262 Difficulty in walking, not elsewhere classified: Secondary | ICD-10-CM

## 2017-04-21 NOTE — Therapy (Signed)
[facility] [place], [place], [address] Phone: [phone]   Fax:  [phone]  Physical Therapy Treatment  Patient Details  Name: Abigail Golden MRN: [id number] Date of Birth: 19-Oct-1965 Referring Provider: Susa Day, MD  Encounter Date: 04/21/2017      PT End of Session - 04/21/17 1038    Visit Number 3   Number of Visits 13   Authorization Type BCBS   PT Start Time 1030   PT Stop Time 1125   PT Time Calculation (min) 55 min      Past Medical History:  Diagnosis Date  . Anxiety   . Chronic bronchitis ([place])   . Depression   . Hypoglycemia   . Lower back pain    since fall ~ 2012 (05/25/2015)  . Migraine    05/25/2015 "probably once/month"  . Pain in left hip    since fall ~ 2012 (05/25/2015)  . Pneumonia "several times"    Past Surgical History:  Procedure Laterality Date  . ABDOMINAL HYSTERECTOMY  11/2002   "found benign tumors"  . CHOLECYSTECTOMY N/A 05/25/2015   Procedure: LAPAROSCOPIC CHOLECYSTECTOMY;  Surgeon: Ralene Ok, MD;  Location: [place];  Service: General;  Laterality: N/A;  . LAPAROSCOPIC CHOLECYSTECTOMY  05/25/2015    There were no vitals filed for this visit.      Subjective Assessment - 04/21/17 1037    Subjective Pt reporting right knee injury on 02/07/17 when walking and carrying a box, pt twisted and heard a pop. pt reporting history of R ankle weakness and pt arriving wearing an ankle stablizer. 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 Pt had previously seen her MD after the injury on 02/07/17 and X-rays were performed, no fracture was evident. Pt presenting today with medial meniscus tear s/p surgery on 03/23/17.    Pertinent History history of left side back injury   Limitations House hold activities;Writing;Walking   How long can you sit  comfortably? 20 minutes   How long can you stand comfortably? 10 minutes   How long can you walk comfortably? 5 minutes   Diagnostic tests MRI   Patient Stated Goals Get back to work, walk without crutch   Currently in Pain? Yes   Pain Score 5    Pain Location Knee   Pain Orientation Right   Pain Descriptors / Indicators Aching   Pain Type Surgical pain   Pain Onset 1 to 4 weeks ago   Pain Frequency Constant                         [facility] Adult PT Treatment/Exercise - 04/21/17 0001      Exercises   Exercises Knee/Hip     Knee/Hip Exercises: Aerobic   Recumbent Bike  L4 x 10 mins seat 10, Bike x 5 mins partial Revs only due to pain     Knee/Hip Exercises: Seated   Long Arc Quad Right;3 sets;10 reps   Long Arc Quad Weight 2 lbs.     Knee/Hip Exercises: Supine   Straight Leg Raises AROM;10 reps;3 sets     Modalities   Modalities Electrical Stimulation;Vasopneumatic     Electrical Stimulation   Electrical Stimulation Location RT knee IFC x15 mins 80-150 hz    Electrical Stimulation Goals Pain  Vasopneumatic   Number Minutes Vasopneumatic  15 minutes   Vasopnuematic Location  Knee   Vasopneumatic Pressure Medium   Vasopneumatic Temperature  36                     PT Long Term Goals - 04/14/17 1316      PT LONG TERM GOAL #1   Title Pt will be independent in her HEP and progression.   Time 8   Period Weeks   Status New   Target Date 06/09/17     PT LONG TERM GOAL #2   Title Pt will be able to demonstrate >/= 4/5 strength in right knee to improve functional mobilty and gait.   Time 8   Period Weeks   Status New   Target Date 06/09/17     PT LONG TERM GOAL #3   Title Pt will be able to deomonstrate step through gait pattern with no assistive device.    Time 8   Period Weeks   Status New   Target Date 06/09/17               Plan - 04/21/17 1109    Clinical Impression Statement Pt arrived today ambulating with one  crutch, but doing a little better with ROM and exercise tolerance. She was able to reach 0-100 degrees today. She is still guarded at times with transition with the bike and nustep. Normal response with modalities.   Clinical Decision Making Low   Rehab Potential Excellent   PT Frequency 2x / week   PT Duration 6 weeks   PT Treatment/Interventions ADLs/Self Care Home Management;Electrical Stimulation;Cryotherapy;Ultrasound;Passive range of motion;Balance training;Therapeutic exercise;Therapeutic activities;Functional mobility training;Stair training;Gait training;Patient/family education;Manual techniques;Taping   PT Next Visit Plan Nustep, Knee strengthening, gait training   PT Home Exercise Plan Quad sets   Consulted and Agree with Plan of Care Patient      Patient will benefit from skilled therapeutic intervention in order to improve the following deficits and impairments:  Abnormal gait  Visit Diagnosis: Acute pain of right knee  Stiffness of right knee, not elsewhere classified  Difficulty in walking, not elsewhere classified  Muscle weakness (generalized)     Problem List Patient Active Problem List   Diagnosis Date Noted  . Cholecystitis 05/25/2015    RAMSEUR,CHRIS, PTA 04/21/2017, 11:30 AM  [facility] [place], [place], [address] Phone: [phone]   Fax:  [phone]  Name: CHIMENE SALO MRN: [id number] Date of Birth: [birth year]

## 2017-04-23 ENCOUNTER — Ambulatory Visit: Payer: BC Managed Care – PPO | Attending: Specialist | Admitting: *Deleted

## 2017-04-23 DIAGNOSIS — R262 Difficulty in walking, not elsewhere classified: Secondary | ICD-10-CM | POA: Insufficient documentation

## 2017-04-23 DIAGNOSIS — M6281 Muscle weakness (generalized): Secondary | ICD-10-CM | POA: Diagnosis present

## 2017-04-23 DIAGNOSIS — M25561 Pain in right knee: Secondary | ICD-10-CM | POA: Diagnosis present

## 2017-04-23 DIAGNOSIS — M25661 Stiffness of right knee, not elsewhere classified: Secondary | ICD-10-CM | POA: Diagnosis present

## 2017-04-23 NOTE — Therapy (Signed)
[facility] [place], [place], [address] Phone: [phone]   Fax:  [phone]  Physical Therapy Treatment  Patient Details  Name: Abigail Golden MRN: [id number] Date of Birth: Jul 31, 1965 Referring Provider: Susa Day, MD  Encounter Date: 04/23/2017      PT End of Session - 04/23/17 1048    Visit Number 4   Number of Visits 13   Authorization Type BCBS   PT Start Time 1030   PT Stop Time 1130   PT Time Calculation (min) 60 min      Past Medical History:  Diagnosis Date  . Anxiety   . Chronic bronchitis ([place])   . Depression   . Hypoglycemia   . Lower back pain    since fall ~ 2012 (05/25/2015)  . Migraine    05/25/2015 "probably once/month"  . Pain in left hip    since fall ~ 2012 (05/25/2015)  . Pneumonia "several times"    Past Surgical History:  Procedure Laterality Date  . ABDOMINAL HYSTERECTOMY  11/2002   "found benign tumors"  . CHOLECYSTECTOMY N/A 05/25/2015   Procedure: LAPAROSCOPIC CHOLECYSTECTOMY;  Surgeon: Ralene Ok, MD;  Location: [facility];  Service: General;  Laterality: N/A;  . LAPAROSCOPIC CHOLECYSTECTOMY  05/25/2015    There were no vitals filed for this visit.      Subjective Assessment - 04/23/17 1045    Subjective RT knee is doing a little better and I'm trying to use the crutch less.    Pertinent History history of left side back injury   Limitations House hold activities;Writing;Walking   How long can you sit comfortably? 20 minutes   How long can you stand comfortably? 10 minutes   How long can you walk comfortably? 5 minutes   Diagnostic tests MRI   Patient Stated Goals Get back to work, walk without crutch   Currently in Pain? Yes   Pain Score 5    Pain Location Knee   Pain Descriptors / Indicators Aching   Pain Type Surgical pain   Pain Onset 1 to 4 weeks ago   Pain Frequency Constant                         [facility] Adult PT Treatment/Exercise -  04/23/17 0001      Exercises   Exercises Knee/Hip     Knee/Hip Exercises: Aerobic   Nustep L6 x 20  mins  seat 9,8     Knee/Hip Exercises: Standing   Rocker Board 5 minutes  calf stretching and balance     Knee/Hip Exercises: Seated   Long Arc Quad Right;3 sets;10 reps   Long Arc Quad Weight 2 lbs.     Knee/Hip Exercises: Supine   Straight Leg Raises AROM;10 reps;3 sets     Modalities   Modalities Electrical Stimulation;Vasopneumatic     Electrical Stimulation   Electrical Stimulation Location RT knee IFC x15 mins 80-150 hz    Electrical Stimulation Goals Pain     Vasopneumatic   Number Minutes Vasopneumatic  15 minutes   Vasopnuematic Location  Knee   Vasopneumatic Pressure Medium   Vasopneumatic Temperature  36                     PT Long Term Goals - 04/14/17 1316      PT LONG TERM GOAL #1   Title Pt will be independent in her HEP and progression.   Time 8  Period Weeks   Status New   Target Date 06/09/17     PT LONG TERM GOAL #2   Title Pt will be able to demonstrate >/= 4/5 strength in right knee to improve functional mobilty and gait.   Time 8   Period Weeks   Status New   Target Date 06/09/17     PT LONG TERM GOAL #3   Title Pt will be able to deomonstrate step through gait pattern with no assistive device.    Time 8   Period Weeks   Status New   Target Date 06/09/17               Plan - 04/23/17 1119    Clinical Impression Statement Pt arrived today doing a little better with less pain and improved gait. She was able to perform therex with less pain as well and ambulated in clinic without crutch. ROM was about the same to 110 degrees of flexion.   Rehab Potential Excellent   PT Frequency 2x / week   PT Duration 6 weeks   PT Treatment/Interventions ADLs/Self Care Home Management;Electrical Stimulation;Cryotherapy;Ultrasound;Passive range of motion;Balance training;Therapeutic exercise;Therapeutic activities;Functional  mobility training;Stair training;Gait training;Patient/family education;Manual techniques;Taping   PT Next Visit Plan Nustep, Knee strengthening, gait training   PT Home Exercise Plan Quad sets   Consulted and Agree with Plan of Care Patient      Patient will benefit from skilled therapeutic intervention in order to improve the following deficits and impairments:  Abnormal gait  Visit Diagnosis: Acute pain of right knee  Stiffness of right knee, not elsewhere classified  Difficulty in walking, not elsewhere classified  Muscle weakness (generalized)     Problem List Patient Active Problem List   Diagnosis Date Noted  . Cholecystitis 05/25/2015    Jake Goodson,CHRIS , PTA 04/23/2017, 11:52 AM  [facility] [place], [place], [address] Phone: [phone]   Fax:  [phone]  Name: Abigail Golden MRN: [id number] Date of Birth: 04-23-1966

## 2017-04-28 ENCOUNTER — Encounter: Payer: Self-pay | Admitting: Physical Therapy

## 2017-04-28 ENCOUNTER — Ambulatory Visit: Payer: BC Managed Care – PPO | Admitting: Physical Therapy

## 2017-04-28 DIAGNOSIS — M6281 Muscle weakness (generalized): Secondary | ICD-10-CM

## 2017-04-28 DIAGNOSIS — M25561 Pain in right knee: Secondary | ICD-10-CM | POA: Diagnosis not present

## 2017-04-28 DIAGNOSIS — R262 Difficulty in walking, not elsewhere classified: Secondary | ICD-10-CM

## 2017-04-28 DIAGNOSIS — M25661 Stiffness of right knee, not elsewhere classified: Secondary | ICD-10-CM

## 2017-04-28 NOTE — Therapy (Signed)
[facility] [place], [place], [address] Phone: [phone]   Fax:  [phone]  Physical Therapy Treatment  Patient Details  Name: Abigail Golden MRN: [id number] Date of Birth: 02-25-1966 Referring Provider: Susa Day, MD   Encounter Date: 04/28/2017  PT End of Session - 04/28/17 1311    Visit Number  5    Number of Visits  13    Authorization Type  BCBS    PT Start Time  2229    PT Stop Time  1346    PT Time Calculation (min)  41 min    Activity Tolerance  Patient tolerated treatment well    Behavior During Therapy  [facility] for tasks assessed/performed       Past Medical History:  Diagnosis Date  . Anxiety   . Chronic bronchitis ([place])   . Depression   . Hypoglycemia   . Lower back pain    since fall ~ 2012 (05/25/2015)  . Migraine    05/25/2015 "probably once/month"  . Pain in left hip    since fall ~ 2012 (05/25/2015)  . Pneumonia "several times"    Past Surgical History:  Procedure Laterality Date  . ABDOMINAL HYSTERECTOMY  11/2002   "found benign tumors"  . LAPAROSCOPIC CHOLECYSTECTOMY  05/25/2015    There were no vitals filed for this visit.  Subjective Assessment - 04/28/17 1308    Subjective  Reports that she woke with a migraine and states that her R ankle is hurting today as well.    Pertinent History  history of left side back injury    Limitations  House hold activities;Writing;Walking    How long can you sit comfortably?  20 minutes    How long can you stand comfortably?  10 minutes    How long can you walk comfortably?  5 minutes    Diagnostic tests  MRI    Patient Stated Goals  Get back to work, walk without crutch    Currently in Pain?  Yes    Pain Score  4     Pain Location  Knee    Pain Orientation  Right    Pain Descriptors / Indicators  Discomfort    Pain Type  Surgical pain    Pain Onset  1 to 4 weeks ago         [facility] PT Assessment - 04/28/17 0001      Assessment   Medical Diagnosis  medial meniscus repair on right knee    Onset Date/Surgical Date  03/23/17    Hand Dominance  Right    Next MD Visit  05/04/2017    Prior Therapy  no      Precautions   Precautions  None      Restrictions   Weight Bearing Restrictions  No                  [facility] Adult PT Treatment/Exercise - 04/28/17 0001      Knee/Hip Exercises: Aerobic   Nustep  L4 x 12 min, seat 9      Knee/Hip Exercises: Standing   Hip Abduction  AROM;Right;10 reps;Knee straight    Abduction Limitations  limited with LLE SLS due to pain      Knee/Hip Exercises: Seated   Long Arc Quad  Strengthening;Right;2 sets;10 reps    Long Arc Quad Weight  4 lbs.      Knee/Hip Exercises: Supine   Straight Leg Raises  AROM;Right;2 sets;10 reps  Modalities   Modalities  Management consultant  IFC    Electrical Stimulation Parameters  1-10 hz x15 min    Electrical Stimulation Goals  Pain      Vasopneumatic   Number Minutes Vasopneumatic   15 minutes    Vasopnuematic Location   Knee    Vasopneumatic Pressure  Medium    Vasopneumatic Temperature   34                  PT Long Term Goals - 04/14/17 1316      PT LONG TERM GOAL #1   Title  Pt will be independent in her HEP and progression.    Time  8    Period  Weeks    Status  New    Target Date  06/09/17      PT LONG TERM GOAL #2   Title  Pt will be able to demonstrate >/= 4/5 strength in right knee to improve functional mobilty and gait.    Time  8    Period  Weeks    Status  New    Target Date  06/09/17      PT LONG TERM GOAL #3   Title  Pt will be able to deomonstrate step through gait pattern with no assistive device.     Time  8    Period  Weeks    Status  New    Target Date  06/09/17            Plan - 04/28/17 1337    Clinical Impression Statement  Patient limited secondary  to L hip pain and R ankle pain as well as R knee pain. Patient limited with standing exercises secondary to ankle and L hip discomfort although not reporting pain in R knee. Increased discomfort reported by patient with LAQ today as well in inferior knee. Patient still very tender along R MCL region upon palpation. Normal modalities response noted following removal of the modalities.     Rehab Potential  Excellent    PT Frequency  2x / week    PT Duration  6 weeks    PT Treatment/Interventions  ADLs/Self Care Home Management;Electrical Stimulation;Cryotherapy;Ultrasound;Passive range of motion;Balance training;Therapeutic exercise;Therapeutic activities;Functional mobility training;Stair training;Gait training;Patient/family education;Manual techniques;Taping    PT Next Visit Plan  Nustep, Knee strengthening, gait training    PT Home Exercise Plan  Quad sets    Consulted and Agree with Plan of Care  Patient       Patient will benefit from skilled therapeutic intervention in order to improve the following deficits and impairments:  Abnormal gait  Visit Diagnosis: Acute pain of right knee  Stiffness of right knee, not elsewhere classified  Difficulty in walking, not elsewhere classified  Muscle weakness (generalized)     Problem List Patient Active Problem List   Diagnosis Date Noted  . Cholecystitis 05/25/2015    Wynelle Fanny, PTA 04/28/2017, 2:24 PM  [facility] [place], [place], [address] Phone: [phone]   Fax:  [phone]  Name: Abigail Golden MRN: [id number] Date of Birth: 07-29-1965

## 2017-04-30 ENCOUNTER — Ambulatory Visit: Payer: BC Managed Care – PPO | Admitting: Physical Therapy

## 2017-04-30 ENCOUNTER — Encounter: Payer: Self-pay | Admitting: Physical Therapy

## 2017-04-30 DIAGNOSIS — M25561 Pain in right knee: Secondary | ICD-10-CM | POA: Diagnosis not present

## 2017-04-30 DIAGNOSIS — R262 Difficulty in walking, not elsewhere classified: Secondary | ICD-10-CM

## 2017-04-30 DIAGNOSIS — M25661 Stiffness of right knee, not elsewhere classified: Secondary | ICD-10-CM

## 2017-04-30 DIAGNOSIS — M6281 Muscle weakness (generalized): Secondary | ICD-10-CM

## 2017-04-30 NOTE — Therapy (Signed)
[facility] [place], [place], [address] Phone: [phone]   Fax:  [phone]  Physical Therapy Treatment  Patient Details  Name: Abigail Golden MRN: [id number] Date of Birth: [birth year] Referring Provider: Susa Day, MD   Encounter Date: 04/30/2017  PT End of Session - 04/30/17 1107    Visit Number  6    Number of Visits  13    Authorization Type  BCBS    PT Start Time  1031    PT Stop Time  1120    PT Time Calculation (min)  49 min    Activity Tolerance  Patient tolerated treatment well    Behavior During Therapy  [facility] for tasks assessed/performed       Past Medical History:  Diagnosis Date  . Anxiety   . Chronic bronchitis ([place])   . Depression   . Hypoglycemia   . Lower back pain    since fall ~ 2012 (05/25/2015)  . Migraine    05/25/2015 "probably once/month"  . Pain in left hip    since fall ~ 2012 (05/25/2015)  . Pneumonia "several times"    Past Surgical History:  Procedure Laterality Date  . ABDOMINAL HYSTERECTOMY  11/2002   "found benign tumors"  . LAPAROSCOPIC CHOLECYSTECTOMY  05/25/2015    There were no vitals filed for this visit.  Subjective Assessment - 04/30/17 1038    Subjective  Patient reported increased pain upon arrival due to too much standing activity yesterday    Pertinent History  history of left side back injury    Limitations  House hold activities;Writing;Walking    How long can you sit comfortably?  20 minutes    How long can you stand comfortably?  10 minutes    How long can you walk comfortably?  5 minutes    Diagnostic tests  MRI    Patient Stated Goals  Get back to work, walk without crutch    Currently in Pain?  Yes    Pain Score  6     Pain Location  Knee    Pain Orientation  Right    Pain Descriptors / Indicators  Discomfort;Aching    Pain Type  Surgical pain    Pain Onset  1 to 4 weeks ago    Pain Frequency  Constant    Aggravating Factors   prolong standing     Pain Relieving Factors  meds rest                      [facility] Adult PT Treatment/Exercise - 04/30/17 0001      Knee/Hip Exercises: Aerobic   Nustep  L4 x 14 min, seat 9      Knee/Hip Exercises: Standing   Terminal Knee Extension  Strengthening;Right;20 reps;Theraband    Theraband Level (Terminal Knee Extension)  Level 2 (Red)      Acupuncturist Location  R knee    Electrical Stimulation Action  IFC    Electrical Stimulation Parameters  1-10hz  x49min    Electrical Stimulation Goals  Pain      Vasopneumatic   Number Minutes Vasopneumatic   15 minutes    Vasopnuematic Location   Knee    Vasopneumatic Pressure  Medium      Manual Therapy   Manual Therapy  Soft tissue mobilization    Soft tissue mobilization  manual gentle STW to right medial knee to reduce pain and tone in knee  PT Long Term Goals - 04/30/17 1040      PT LONG TERM GOAL #1   Title  Pt will be independent in her HEP and progression.    Time  8    Period  Weeks    Status  On-going      PT LONG TERM GOAL #2   Title  Pt will be able to demonstrate >/= 4/5 strength in right knee to improve functional mobilty and gait.    Time  8    Period  Weeks    Status  On-going      PT LONG TERM GOAL #3   Title  Pt will be able to deomonstrate step through gait pattern with no assistive device.     Time  8    Period  Weeks    Status  On-going            Plan - 04/30/17 1108    Clinical Impression Statement  Patient tolerated treatment fair today and was limited due to increased pain after prolong walking yesterday as reported. Patient has palpable pain in medial right knee today. Minimal exercises today due to increased pain. Current goals ongpoing at this time.     Rehab Potential  Excellent    PT Frequency  2x / week    PT Duration  6 weeks    PT Treatment/Interventions  ADLs/Self Care Home Management;Electrical  Stimulation;Cryotherapy;Ultrasound;Passive range of motion;Balance training;Therapeutic exercise;Therapeutic activities;Functional mobility training;Stair training;Gait training;Patient/family education;Manual techniques;Taping    PT Next Visit Plan  cont with POC for Nustep, Knee strengthening, gait training MD note sent for appt today    Consulted and Agree with Plan of Care  Patient       Patient will benefit from skilled therapeutic intervention in order to improve the following deficits and impairments:  Abnormal gait  Visit Diagnosis: Acute pain of right knee  Stiffness of right knee, not elsewhere classified  Difficulty in walking, not elsewhere classified  Muscle weakness (generalized)     Problem List Patient Active Problem List   Diagnosis Date Noted  . Cholecystitis 05/25/2015    Ladean Raya, PTA 04/30/17 1:11 PM Mali Applegate MPT [facility] [place], [place], [address] Phone: [phone]   Fax:  [phone]  Name: BROOKLYNNE PEREIDA MRN: [id number] Date of Birth: 30-Apr-1966

## 2017-05-05 ENCOUNTER — Ambulatory Visit: Payer: BC Managed Care – PPO | Admitting: Physical Therapy

## 2017-05-05 DIAGNOSIS — M6281 Muscle weakness (generalized): Secondary | ICD-10-CM

## 2017-05-05 DIAGNOSIS — M25561 Pain in right knee: Secondary | ICD-10-CM

## 2017-05-05 DIAGNOSIS — R262 Difficulty in walking, not elsewhere classified: Secondary | ICD-10-CM

## 2017-05-05 DIAGNOSIS — M25661 Stiffness of right knee, not elsewhere classified: Secondary | ICD-10-CM

## 2017-05-05 NOTE — Therapy (Signed)
[facility] [place], [place], [address] Phone: [phone]   Fax:  [phone]  Physical Therapy Treatment  Patient Details  Name: Abigail Golden MRN: [id number] Date of Birth: Dec 29, 1965 Referring Provider: Susa Day, MD   Encounter Date: 05/05/2017  PT End of Session - 05/05/17 1042    Visit Number  7    Number of Visits  13    Authorization Type  BCBS    PT Start Time  1245    PT Stop Time  1132    PT Time Calculation (min)  57 min    Activity Tolerance  Patient tolerated treatment well    Behavior During Therapy  [facility] for tasks assessed/performed       Past Medical History:  Diagnosis Date  . Anxiety   . Chronic bronchitis ([place])   . Depression   . Hypoglycemia   . Lower back pain    since fall ~ 2012 (05/25/2015)  . Migraine    05/25/2015 "probably once/month"  . Pain in left hip    since fall ~ 2012 (05/25/2015)  . Pneumonia "several times"    Past Surgical History:  Procedure Laterality Date  . ABDOMINAL HYSTERECTOMY  11/2002   "found benign tumors"  . LAPAROSCOPIC CHOLECYSTECTOMY  05/25/2015    There were no vitals filed for this visit.  Subjective Assessment - 05/05/17 1045    Subjective  Reports that MD said her knee was doing good and she is to continue 3 more weeks of PT and RTW December 10th. Reports that she has been having pain along the R MCL region. Has been trying to use one crutch but with it raining she has been very diligent with crutch as to avoids falls. Has not taken any pain medication. Reports greater L knee pain but thinks that is from over compensation.    Pertinent History  history of left side back injury    Limitations  House hold activities;Writing;Walking    How long can you sit comfortably?  20 minutes    How long can you stand comfortably?  10 minutes    How long can you walk comfortably?  5 minutes    Diagnostic tests  MRI    Patient Stated Goals  Get back to work,  walk without crutch    Currently in Pain?  Yes    Pain Score  3     Pain Location  Knee    Pain Orientation  Right;Medial    Pain Descriptors / Indicators  Discomfort    Pain Type  Surgical pain    Pain Onset  1 to 4 weeks ago         [facility] PT Assessment - 05/05/17 0001      Assessment   Medical Diagnosis  medial meniscus repair on right knee    Onset Date/Surgical Date  03/23/17    Hand Dominance  Right    Next MD Visit  None    Prior Therapy  no      Precautions   Precautions  None      Restrictions   Weight Bearing Restrictions  No                  [facility] Adult PT Treatment/Exercise - 05/05/17 0001      Knee/Hip Exercises: Aerobic   Nustep  L5 x16 min      Knee/Hip Exercises: Standing   Hip Flexion  AROM;Right;15 reps;Knee bent    Hip Abduction  AROM;Both;20 reps;Knee straight      Knee/Hip Exercises: Seated   Long Arc Quad  Strengthening;Right;3 sets;10 reps;Weights    Long Arc Quad Weight  3 lbs.      Modalities   Modalities  Management consultant  IFC    Electrical Stimulation Parameters  1-10 hz x15 min    Electrical Stimulation Goals  Pain      Vasopneumatic   Number Minutes Vasopneumatic   15 minutes    Vasopnuematic Location   Knee    Vasopneumatic Pressure  Medium    Vasopneumatic Temperature   34      Manual Therapy   Manual Therapy  Soft tissue mobilization    Soft tissue mobilization  Gentle STW to R MCL to reduce pain and tenderness                  PT Long Term Goals - 04/30/17 1040      PT LONG TERM GOAL #1   Title  Pt will be independent in her HEP and progression.    Time  8    Period  Weeks    Status  On-going      PT LONG TERM GOAL #2   Title  Pt will be able to demonstrate >/= 4/5 strength in right knee to improve functional mobilty and gait.    Time  8    Period  Weeks    Status   On-going      PT LONG TERM GOAL #3   Title  Pt will be able to deomonstrate step through gait pattern with no assistive device.     Time  8    Period  Weeks    Status  On-going            Plan - 05/05/17 1151    Clinical Impression Statement  Patient tolerated today's treatment fairly well today although she is still having pain from R MCL region. Patient limited with B hip abduction while standing on LLE due to L low back pain. R hip flexion with knee flexion completed as patient has reported difficulty getting into shower. Patient still very tender to R MCL as patient very sensitive to palpation and removal of TENS electrodes. Normal modalities response noted following removal of the modalities.    Rehab Potential  Excellent    PT Frequency  2x / week    PT Duration  6 weeks    PT Treatment/Interventions  ADLs/Self Care Home Management;Electrical Stimulation;Cryotherapy;Ultrasound;Passive range of motion;Balance training;Therapeutic exercise;Therapeutic activities;Functional mobility training;Stair training;Gait training;Patient/family education;Manual techniques;Taping    PT Next Visit Plan  cont with POC for Nustep, Knee strengthening, gait training MD note sent for appt today    PT Home Exercise Plan  Quad sets    Consulted and Agree with Plan of Care  Patient       Patient will benefit from skilled therapeutic intervention in order to improve the following deficits and impairments:  Abnormal gait  Visit Diagnosis: Acute pain of right knee  Stiffness of right knee, not elsewhere classified  Difficulty in walking, not elsewhere classified  Muscle weakness (generalized)     Problem List Patient Active Problem List   Diagnosis Date Noted  . Cholecystitis 05/25/2015    Standley Brooking, PTA 05/05/2017, 12:06 PM  [facility] [address] [place], [place], [address]  Phone: [phone]   Fax:  [phone]  Name: Abigail Golden MRN: [id number] Date of Birth: 08-21-1965

## 2017-05-07 ENCOUNTER — Encounter: Payer: Self-pay | Admitting: Physical Therapy

## 2017-05-07 ENCOUNTER — Ambulatory Visit: Payer: BC Managed Care – PPO | Admitting: Physical Therapy

## 2017-05-07 DIAGNOSIS — M25561 Pain in right knee: Secondary | ICD-10-CM | POA: Diagnosis not present

## 2017-05-07 DIAGNOSIS — M6281 Muscle weakness (generalized): Secondary | ICD-10-CM

## 2017-05-07 DIAGNOSIS — R262 Difficulty in walking, not elsewhere classified: Secondary | ICD-10-CM

## 2017-05-07 DIAGNOSIS — M25661 Stiffness of right knee, not elsewhere classified: Secondary | ICD-10-CM

## 2017-05-07 NOTE — Therapy (Signed)
[facility] [place], [place], [address] Phone: [phone]   Fax:  [phone]  Physical Therapy Treatment  Patient Details  Name: Abigail Golden MRN: [id number] Date of Birth: [birth year] Referring Provider: Susa Day, MD   Encounter Date: 05/07/2017  PT End of Session - 05/07/17 1046    Visit Number  8    Number of Visits  13    Authorization Type  BCBS    PT Start Time  5053    PT Stop Time  1143    PT Time Calculation (min)  68 min    Activity Tolerance  Patient tolerated treatment well    Behavior During Therapy  [facility] for tasks assessed/performed       Past Medical History:  Diagnosis Date  . Anxiety   . Chronic bronchitis ([place])   . Depression   . Hypoglycemia   . Lower back pain    since fall ~ 2012 (05/25/2015)  . Migraine    05/25/2015 "probably once/month"  . Pain in left hip    since fall ~ 2012 (05/25/2015)  . Pneumonia "several times"    Past Surgical History:  Procedure Laterality Date  . ABDOMINAL HYSTERECTOMY  11/2002   "found benign tumors"  . CHOLECYSTECTOMY N/A 05/25/2015   Procedure: LAPAROSCOPIC CHOLECYSTECTOMY;  Surgeon: Ralene Ok, MD;  Location: [place];  Service: General;  Laterality: N/A;  . LAPAROSCOPIC CHOLECYSTECTOMY  05/25/2015    There were no vitals filed for this visit.  Subjective Assessment - 05/07/17 1036    Subjective  Reports that her R MCL region and states that she stumbled on a rug in her bedroom recently and put a lot of weight through RLE to catch herself. Reports that the greatest difficulty is her LLE pain from her back injury.    Pertinent History  history of left side back injury    Limitations  House hold activities;Writing;Walking    How long can you sit comfortably?  20 minutes    How long can you stand comfortably?  10 minutes    How long can you walk comfortably?  5 minutes    Diagnostic tests  MRI    Patient Stated Goals  Get back to work, walk  without crutch    Currently in Pain?  Yes    Pain Score  5     Pain Location  Knee    Pain Orientation  Right    Pain Descriptors / Indicators  Stabbing;Sharp;Shooting    Pain Onset  1 to 4 weeks ago    Pain Frequency  Intermittent with palpation of the R MCL    Multiple Pain Sites  Yes    Pain Score  3    Pain Location  Leg    Pain Orientation  Left;Medial    Pain Descriptors / Indicators  Constant;Discomfort    Pain Type  Chronic pain    Pain Frequency  Constant         [facility] PT Assessment - 05/07/17 0001      Assessment   Medical Diagnosis  medial meniscus repair on right knee    Onset Date/Surgical Date  03/23/17    Hand Dominance  Right    Next MD Visit  None    Prior Therapy  no      Precautions   Precautions  None      Restrictions   Weight Bearing Restrictions  No  [facility] Adult PT Treatment/Exercise - 05/07/17 0001      Knee/Hip Exercises: Aerobic   Nustep  L6 x16 min      Knee/Hip Exercises: Standing   Hip Flexion  AROM;Both;10 reps;Knee bent    Terminal Knee Extension  Strengthening;Right;20 reps Pink XTS    Hip Abduction  AROM;Both;20 reps;Knee straight      Knee/Hip Exercises: Supine   Straight Leg Raises  AROM;Right;2 sets;10 reps      Modalities   Modalities  Youth worker  IFC    Electrical Stimulation Parameters  1-10 hz x15 min    Electrical Stimulation Goals  Pain      Ultrasound   Ultrasound Location  R MCL region    Ultrasound Parameters  1.5 w/cm2, 100%, 1 mhz x10 min    Ultrasound Goals  Pain      Vasopneumatic   Number Minutes Vasopneumatic   15 minutes    Vasopnuematic Location   Knee    Vasopneumatic Pressure  Medium    Vasopneumatic Temperature   34                  PT Long Term Goals - 04/30/17 1040      PT LONG TERM GOAL #1   Title  Pt will be  independent in her HEP and progression.    Time  8    Period  Weeks    Status  On-going      PT LONG TERM GOAL #2   Title  Pt will be able to demonstrate >/= 4/5 strength in right knee to improve functional mobilty and gait.    Time  8    Period  Weeks    Status  On-going      PT LONG TERM GOAL #3   Title  Pt will be able to deomonstrate step through gait pattern with no assistive device.     Time  8    Period  Weeks    Status  On-going            Plan - 05/07/17 1201    Clinical Impression Statement  Patient tolerated today's treatment well although she is still very palpably tender and sensitive to R MCL region. More BLE exercises completed today as to provide more stability during ambulation as patient feels weakness in BLE with ambulation. Combo session completed to R MCL region as to provide pain relief and reduce sensitivity but patient still very sensitive to contact of [place] head. Normal modalities response noted following removal of the modalities.    Rehab Potential  Excellent    PT Frequency  2x / week    PT Duration  6 weeks    PT Treatment/Interventions  ADLs/Self Care Home Management;Electrical Stimulation;Cryotherapy;Ultrasound;Passive range of motion;Balance training;Therapeutic exercise;Therapeutic activities;Functional mobility training;Stair training;Gait training;Patient/family education;Manual techniques;Taping    PT Next Visit Plan  cont with POC for Nustep, Knee strengthening, gait training MD note sent for appt today    PT Home Exercise Plan  Quad sets    Consulted and Agree with Plan of Care  Patient       Patient will benefit from skilled therapeutic intervention in order to improve the following deficits and impairments:  Abnormal gait  Visit Diagnosis: Acute pain of right knee  Stiffness of right knee, not elsewhere classified  Difficulty in walking, not elsewhere classified  Muscle  weakness (generalized)     Problem List Patient Active  Problem List   Diagnosis Date Noted  . Cholecystitis 05/25/2015    Standley Brooking, PTA 05/07/2017, 1:19 PM  [facility] [address] [place], [place], [address] Phone: [phone]   Fax:  [phone]  Name: Abigail Golden MRN: [id number] Date of Birth: 01-06-1966

## 2017-05-12 ENCOUNTER — Ambulatory Visit: Payer: BC Managed Care – PPO | Admitting: Physical Therapy

## 2017-05-12 ENCOUNTER — Encounter: Payer: Self-pay | Admitting: Physical Therapy

## 2017-05-12 DIAGNOSIS — M25561 Pain in right knee: Secondary | ICD-10-CM | POA: Diagnosis not present

## 2017-05-12 DIAGNOSIS — R262 Difficulty in walking, not elsewhere classified: Secondary | ICD-10-CM

## 2017-05-12 DIAGNOSIS — M6281 Muscle weakness (generalized): Secondary | ICD-10-CM

## 2017-05-12 DIAGNOSIS — M25661 Stiffness of right knee, not elsewhere classified: Secondary | ICD-10-CM

## 2017-05-12 NOTE — Therapy (Signed)
[facility] [place], [place], [address] Phone: [phone]   Fax:  [phone]  Physical Therapy Treatment  Patient Details  Name: Abigail Golden MRN: [id number] Date of Birth: 26-Feb-1966 Referring Provider: Susa Day, MD   Encounter Date: 05/12/2017  PT End of Session - 05/12/17 1101    Visit Number  9    Number of Visits  13    Authorization Type  BCBS    PT Start Time  1033    PT Stop Time  0932    PT Time Calculation (min)  53 min    Activity Tolerance  Patient tolerated treatment well    Behavior During Therapy  [facility] for tasks assessed/performed       Past Medical History:  Diagnosis Date  . Anxiety   . Chronic bronchitis ([place])   . Depression   . Hypoglycemia   . Lower back pain    since fall ~ 2012 (05/25/2015)  . Migraine    05/25/2015 "probably once/month"  . Pain in left hip    since fall ~ 2012 (05/25/2015)  . Pneumonia "several times"    Past Surgical History:  Procedure Laterality Date  . ABDOMINAL HYSTERECTOMY  11/2002   "found benign tumors"  . LAPAROSCOPIC CHOLECYSTECTOMY  05/25/2015  . LAPAROSCOPIC CHOLECYSTECTOMY N/A 05/25/2015   Performed by Ralene Ok, MD at [facility] OR    There were no vitals filed for this visit.  Subjective Assessment - 05/12/17 1040    Subjective  Patient reports that she has been feeling better and has been going up stairs. Still having more trouble out of L hip though.    Pertinent History  history of left side back injury    Limitations  House hold activities;Writing;Walking    How long can you sit comfortably?  20 minutes    How long can you stand comfortably?  10 minutes    How long can you walk comfortably?  5 minutes    Diagnostic tests  MRI    Patient Stated Goals  Get back to work, walk without crutch    Currently in Pain?  No/denies         [facility] PT Assessment - 05/12/17 0001      Assessment   Medical Diagnosis  medial meniscus repair on right  knee    Onset Date/Surgical Date  03/23/17    Hand Dominance  Right    Next MD Visit  None    Prior Therapy  no      Precautions   Precautions  None      Restrictions   Weight Bearing Restrictions  No                  [facility] Adult PT Treatment/Exercise - 05/12/17 0001      Knee/Hip Exercises: Aerobic   Nustep  L5 x20 min      Knee/Hip Exercises: Standing   Terminal Knee Extension  Strengthening;Right;20 reps Pink XTS    Forward Step Up  Both;15 reps;Step Height: 6";Hand Hold: 1      Modalities   Modalities  Psychologist, educational Location  R knee    Electrical Stimulation Action  IFC    Electrical Stimulation Parameters  1-10 hz x15 min    Electrical Stimulation Goals  Pain      Vasopneumatic   Number Minutes Vasopneumatic   15 minutes    Vasopnuematic Location  Knee    Vasopneumatic Pressure  Medium    Vasopneumatic Temperature   34                  PT Long Term Goals - 04/30/17 1040      PT LONG TERM GOAL #1   Title  Pt will be independent in her HEP and progression.    Time  8    Period  Weeks    Status  On-going      PT LONG TERM GOAL #2   Title  Pt will be able to demonstrate >/= 4/5 strength in right knee to improve functional mobilty and gait.    Time  8    Period  Weeks    Status  On-going      PT LONG TERM GOAL #3   Title  Pt will be able to deomonstrate step through gait pattern with no assistive device.     Time  8    Period  Weeks    Status  On-going            Plan - 05/12/17 1114    Clinical Impression Statement  Patient presenting in clinic with less pain in R knee but demonstrating functional weakness with forward step ups utilizing RLE. Patient experienced L arch shooting pain during TKE but did not report any increased pain during treatment. Patient required UE support with R forward step up but patient able to progress to limited or no UE  support. Patient acknowledges pain may be a limitation as well. Patient less tender and sensitive over R MCL region today. Normal modalities response noted following removal of the modalities.    Rehab Potential  Excellent    PT Frequency  2x / week    PT Duration  6 weeks    PT Treatment/Interventions  ADLs/Self Care Home Management;Electrical Stimulation;Cryotherapy;Ultrasound;Passive range of motion;Balance training;Therapeutic exercise;Therapeutic activities;Functional mobility training;Stair training;Gait training;Patient/family education;Manual techniques;Taping    PT Next Visit Plan  cont with POC for Nustep, Knee strengthening, gait training MD note sent for appt today    PT Home Exercise Plan  Quad sets    Consulted and Agree with Plan of Care  Patient       Patient will benefit from skilled therapeutic intervention in order to improve the following deficits and impairments:  Abnormal gait  Visit Diagnosis: Acute pain of right knee  Stiffness of right knee, not elsewhere classified  Difficulty in walking, not elsewhere classified  Muscle weakness (generalized)     Problem List Patient Active Problem List   Diagnosis Date Noted  . Cholecystitis 05/25/2015    Standley Brooking, PTA 05/12/2017, 11:36 AM  [facility] [address] [place], [place], [address] Phone: [phone]   Fax:  [phone]  Name: Abigail Golden MRN: [id number] Date of Birth: [birth year]

## 2017-05-19 ENCOUNTER — Encounter: Payer: Self-pay | Admitting: Physical Therapy

## 2017-05-19 ENCOUNTER — Ambulatory Visit: Payer: BC Managed Care – PPO | Admitting: Physical Therapy

## 2017-05-19 DIAGNOSIS — M6281 Muscle weakness (generalized): Secondary | ICD-10-CM

## 2017-05-19 DIAGNOSIS — R262 Difficulty in walking, not elsewhere classified: Secondary | ICD-10-CM

## 2017-05-19 DIAGNOSIS — M25661 Stiffness of right knee, not elsewhere classified: Secondary | ICD-10-CM

## 2017-05-19 DIAGNOSIS — M25561 Pain in right knee: Secondary | ICD-10-CM

## 2017-05-19 NOTE — Therapy (Signed)
[facility] [place], [place], [address] Phone: [phone]   Fax:  [phone]  Physical Therapy Treatment  Patient Details  Name: Abigail Golden MRN: [id number] Date of Birth: 1965/07/07 Referring Provider: Susa Day, MD   Encounter Date: 05/19/2017  PT End of Session - 05/19/17 1038    Visit Number  10    Number of Visits  13    Authorization Type  BCBS    PT Start Time  1443    PT Stop Time  1122    PT Time Calculation (min)  47 min    Activity Tolerance  Patient tolerated treatment well    Behavior During Therapy  [facility] for tasks assessed/performed       Past Medical History:  Diagnosis Date  . Anxiety   . Chronic bronchitis ([place])   . Depression   . Hypoglycemia   . Lower back pain    since fall ~ 2012 (05/25/2015)  . Migraine    05/25/2015 "probably once/month"  . Pain in left hip    since fall ~ 2012 (05/25/2015)  . Pneumonia "several times"    Past Surgical History:  Procedure Laterality Date  . ABDOMINAL HYSTERECTOMY  11/2002   "found benign tumors"  . CHOLECYSTECTOMY N/A 05/25/2015   Procedure: LAPAROSCOPIC CHOLECYSTECTOMY;  Surgeon: Ralene Ok, MD;  Location: [place];  Service: General;  Laterality: N/A;  . LAPAROSCOPIC CHOLECYSTECTOMY  05/25/2015    There were no vitals filed for this visit.  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 Reports that she has been doing a lot recently and has been diligent about using her ice pack.    Pertinent History  history of left side back injury    Limitations  House hold activities;Writing;Walking    How long can you sit comfortably?  20 minutes    How long can you stand comfortably?  10 minutes    How long can you walk comfortably?  5 minutes    Diagnostic tests  MRI    Patient Stated Goals  Get back to work,  walk without crutch    Currently in Pain?  Yes    Pain Score  2     Pain Location  Knee    Pain Orientation  Right    Pain Descriptors / Indicators  Tender;Sore    Pain Type  Surgical pain    Pain Onset  1 to 4 weeks ago    Pain Frequency  Intermittent         [facility] PT Assessment - 05/19/17 0001      Assessment   Medical Diagnosis  medial meniscus repair on right knee    Onset Date/Surgical Date  03/23/17    Hand Dominance  Right    Next MD Visit  None    Prior Therapy  no      Precautions   Precautions  None      Restrictions   Weight Bearing Restrictions  No                  [facility] Adult PT Treatment/Exercise - 05/19/17 0001      Knee/Hip Exercises: Aerobic   Nustep  L5 x15 min      Knee/Hip Exercises: Standing   Heel Raises  Both;15 reps B toe raise x15  reps    Terminal Knee Extension  Strengthening;Right;15 reps;Theraband    Theraband Level (Terminal Knee Extension)  Level 3 (Green)    Forward Step Up  15 reps;Step Height: 6";Hand Hold: 1;Right      Knee/Hip Exercises: Seated   Long Arc Quad  Strengthening;Right;3 sets;10 reps;Weights      Knee/Hip Exercises: Supine   Straight Leg Raises  AROM;Left;15 reps      Modalities   Modalities  Management consultant  IFC    Electrical Stimulation Parameters  1-10 hz x15 min    Electrical Stimulation Goals  Pain      Vasopneumatic   Number Minutes Vasopneumatic   15 minutes    Vasopnuematic Location   Knee    Vasopneumatic Pressure  Medium    Vasopneumatic Temperature   50                  PT Long Term Goals - 04/30/17 1040      PT LONG TERM GOAL #1   Title  Pt will be independent in her HEP and progression.    Time  8    Period  Weeks    Status  On-going      PT LONG TERM GOAL #2   Title  Pt will be able to demonstrate >/= 4/5 strength in right knee to improve  functional mobilty and gait.    Time  8    Period  Weeks    Status  On-going      PT LONG TERM GOAL #3   Title  Pt will be able to deomonstrate step through gait pattern with no assistive device.     Time  8    Period  Weeks    Status  On-going            Plan - 05/19/17 1202    Clinical Impression Statement  Patient arrived in treatment with low level pain but continues to experience inferior MCL discomfort with palpation. Patient continues to have hesitation with RLE forward step ups with patient reporting at a time of fear of falling or another injury. Patient able to tolerate exercises well although discomfort reported with TKE. Normal modalities response noted following removal of the modalities.    Rehab Potential  Excellent    PT Frequency  2x / week    PT Duration  6 weeks    PT Treatment/Interventions  ADLs/Self Care Home Management;Electrical Stimulation;Cryotherapy;Ultrasound;Passive range of motion;Balance training;Therapeutic exercise;Therapeutic activities;Functional mobility training;Stair training;Gait training;Patient/family education;Manual techniques;Taping    PT Next Visit Plan  cont with POC for Nustep, Knee strengthening, gait training MD note sent for appt today    PT Home Exercise Plan  Quad sets    Consulted and Agree with Plan of Care  Patient       Patient will benefit from skilled therapeutic intervention in order to improve the following deficits and impairments:  Abnormal gait  Visit Diagnosis: Acute pain of right knee  Stiffness of right knee, not elsewhere classified  Difficulty in walking, not elsewhere classified  Muscle weakness (generalized)     Problem List Patient Active Problem List   Diagnosis Date Noted  . Cholecystitis 05/25/2015    Standley Brooking, PTA 05/19/2017, 12:14 PM  [facility] [address] [place], [place], [address] Phone: [phone]   Fax:  [phone]  Name:  Abigail Golden MRN: [id number] Date of Birth: 03-25-1966

## 2017-05-21 ENCOUNTER — Encounter: Payer: Self-pay | Admitting: Physical Therapy

## 2017-05-21 ENCOUNTER — Ambulatory Visit: Payer: BC Managed Care – PPO | Admitting: Physical Therapy

## 2017-05-21 DIAGNOSIS — R262 Difficulty in walking, not elsewhere classified: Secondary | ICD-10-CM

## 2017-05-21 DIAGNOSIS — M25561 Pain in right knee: Secondary | ICD-10-CM | POA: Diagnosis not present

## 2017-05-21 DIAGNOSIS — M6281 Muscle weakness (generalized): Secondary | ICD-10-CM

## 2017-05-21 DIAGNOSIS — M25661 Stiffness of right knee, not elsewhere classified: Secondary | ICD-10-CM

## 2017-05-21 NOTE — Therapy (Signed)
[facility] [place], [place], [address] Phone: [phone]   Fax:  [phone]  Physical Therapy Treatment  Patient Details  Name: Abigail Golden MRN: [id number] Date of Birth: Sep 19, 1965 Referring Provider: Susa Day, MD   Encounter Date: 05/21/2017  PT End of Session - 05/21/17 1041    Visit Number  11    Number of Visits  13    Authorization Type  BCBS    PT Start Time  8144    PT Stop Time  1125    PT Time Calculation (min)  48 min    Activity Tolerance  Patient tolerated treatment well    Behavior During Therapy  [facility] for tasks assessed/performed       Past Medical History:  Diagnosis Date  . Anxiety   . Chronic bronchitis ([place])   . Depression   . Hypoglycemia   . Lower back pain    since fall ~ 2012 (05/25/2015)  . Migraine    05/25/2015 "probably once/month"  . Pain in left hip    since fall ~ 2012 (05/25/2015)  . Pneumonia "several times"    Past Surgical History:  Procedure Laterality Date  . ABDOMINAL HYSTERECTOMY  11/2002   "found benign tumors"  . CHOLECYSTECTOMY N/A 05/25/2015   Procedure: LAPAROSCOPIC CHOLECYSTECTOMY;  Surgeon: Ralene Ok, MD;  Location: [facility];  Service: General;  Laterality: N/A;  . LAPAROSCOPIC CHOLECYSTECTOMY  05/25/2015    There were no vitals filed for this visit.  Subjective Assessment - 05/21/17 1035    Subjective  Reports that she can really tell that cold weather is going to affect her R knee. Reports that she has really been working on her steps.     Pertinent History  history of left side back injury    Limitations  House hold activities;Writing;Walking    How long can you sit comfortably?  20 minutes    How long can you stand comfortably?  10 minutes    How long can you walk comfortably?  5 minutes    Diagnostic tests  MRI    Patient Stated Goals  Get back to work, walk without crutch    Currently in Pain?  No/denies         [facility] PT Assessment -  05/21/17 0001      Assessment   Medical Diagnosis  medial meniscus repair on right knee    Onset Date/Surgical Date  03/23/17    Hand Dominance  Right    Next MD Visit  None    Prior Therapy  no      Precautions   Precautions  None      Restrictions   Weight Bearing Restrictions  No                  [facility] Adult PT Treatment/Exercise - 05/21/17 0001      Knee/Hip Exercises: Standing   Hip Abduction  AROM;Both;15 reps;Knee straight    Lateral Step Up  Right;15 reps;Step Height: 6"    Forward Step Up  Right;20 reps;Step Height: 6"    SLS  3 x max hold      Knee/Hip Exercises: Seated   Long Arc Quad  Strengthening;Right;3 sets;10 reps;Weights    Long Arc Quad Weight  4 lbs.      Knee/Hip Exercises: Supine   Bridges  5 reps attempted but lateral hip pain    Straight Leg Raises  AROM;Right;20 reps    Straight Leg Raise with  External Rotation  AROM;Right;5 reps      Modalities   Modalities  Management consultant  IFC    Electrical Stimulation Parameters  1-10 hz x15 min    Electrical Stimulation Goals  Pain      Vasopneumatic   Number Minutes Vasopneumatic   15 minutes    Vasopnuematic Location   Knee    Vasopneumatic Pressure  Medium    Vasopneumatic Temperature   34                  PT Long Term Goals - 04/30/17 1040      PT LONG TERM GOAL #1   Title  Pt will be independent in her HEP and progression.    Time  8    Period  Weeks    Status  On-going      PT LONG TERM GOAL #2   Title  Pt will be able to demonstrate >/= 4/5 strength in right knee to improve functional mobilty and gait.    Time  8    Period  Weeks    Status  On-going      PT LONG TERM GOAL #3   Title  Pt will be able to deomonstrate step through gait pattern with no assistive device.     Time  8    Period  Weeks    Status  On-going             Plan - 05/21/17 1148    Clinical Impression Statement  Patient arrived today with no pain and patient unable to tolerate stationary bike. Patient able to complete forward and lateral step ups with improved form and less pain. More RLE SLS activities completed today to improve proprioception. Patient demonstrated weakness with SLR with ER and limited with bridges due to lateral hip pain. Normal modalities response noted following removal of the modalities.    Rehab Potential  Excellent    PT Frequency  2x / week    PT Duration  6 weeks    PT Treatment/Interventions  ADLs/Self Care Home Management;Electrical Stimulation;Cryotherapy;Ultrasound;Passive range of motion;Balance training;Therapeutic exercise;Therapeutic activities;Functional mobility training;Stair training;Gait training;Patient/family education;Manual techniques;Taping    PT Next Visit Plan  D/C next treatment.    PT Home Exercise Plan  Quad sets    Consulted and Agree with Plan of Care  Patient       Patient will benefit from skilled therapeutic intervention in order to improve the following deficits and impairments:  Abnormal gait  Visit Diagnosis: Acute pain of right knee  Stiffness of right knee, not elsewhere classified  Difficulty in walking, not elsewhere classified  Muscle weakness (generalized)     Problem List Patient Active Problem List   Diagnosis Date Noted  . Cholecystitis 05/25/2015    Standley Brooking, PTA 05/21/2017, 12:02 PM  [facility] [address] [place], [place], [address] Phone: [phone]   Fax:  [phone]  Name: Abigail Golden MRN: [id number] Date of Birth: 06/09/1966

## 2017-05-26 ENCOUNTER — Ambulatory Visit: Payer: BC Managed Care – PPO | Attending: Specialist | Admitting: Physical Therapy

## 2017-05-26 ENCOUNTER — Encounter: Payer: Self-pay | Admitting: Physical Therapy

## 2017-05-26 DIAGNOSIS — M6281 Muscle weakness (generalized): Secondary | ICD-10-CM

## 2017-05-26 DIAGNOSIS — R262 Difficulty in walking, not elsewhere classified: Secondary | ICD-10-CM

## 2017-05-26 DIAGNOSIS — M25661 Stiffness of right knee, not elsewhere classified: Secondary | ICD-10-CM | POA: Diagnosis present

## 2017-05-26 DIAGNOSIS — M25561 Pain in right knee: Secondary | ICD-10-CM | POA: Diagnosis present

## 2017-05-26 NOTE — Therapy (Signed)
[facility] [place], [place], [address] Phone: [phone]   Fax:  [phone]  Physical Therapy Treatment  Patient Details  Name: Abigail Golden MRN: [id number] Date of Birth: 01-28-1966 Referring Provider: Susa Day, MD   Encounter Date: 05/26/2017  PT End of Session - 05/26/17 1037    Visit Number  12    Number of Visits  13    Authorization Type  BCBS    PT Start Time  0109    PT Stop Time  1135    PT Time Calculation (min)  63 min    Activity Tolerance  Patient tolerated treatment well    Behavior During Therapy  [facility] for tasks assessed/performed       Past Medical History:  Diagnosis Date  . Anxiety   . Chronic bronchitis ([place])   . Depression   . Hypoglycemia   . Lower back pain    since fall ~ 2012 (05/25/2015)  . Migraine    05/25/2015 "probably once/month"  . Pain in left hip    since fall ~ 2012 (05/25/2015)  . Pneumonia "several times"    Past Surgical History:  Procedure Laterality Date  . ABDOMINAL HYSTERECTOMY  11/2002   "found benign tumors"  . CHOLECYSTECTOMY N/A 05/25/2015   Procedure: LAPAROSCOPIC CHOLECYSTECTOMY;  Surgeon: Ralene Ok, MD;  Location: [facility];  Service: General;  Laterality: N/A;  . LAPAROSCOPIC CHOLECYSTECTOMY  05/25/2015    There were no vitals filed for this visit.  Subjective Assessment - 05/26/17 1036    Subjective  Reports she is to return to work 06/01/2017.    Pertinent History  history of left side back injury    Limitations  House hold activities;Writing;Walking    How long can you sit comfortably?  20 minutes    How long can you stand comfortably?  10 minutes    How long can you walk comfortably?  5 minutes    Diagnostic tests  MRI    Patient Stated Goals  Get back to work, walk without crutch    Currently in Pain?  Other (Comment) No pain assessment provided by patient         [facility] PT Assessment - 05/26/17 0001      Assessment   Medical  Diagnosis  medial meniscus repair on right knee    Onset Date/Surgical Date  03/23/17    Hand Dominance  Right    Next MD Visit  None    Prior Therapy  no      Precautions   Precautions  None      Restrictions   Weight Bearing Restrictions  No      ROM / Strength   AROM / PROM / Strength  Strength      Strength   Overall Strength  Deficits    Strength Assessment Site  Knee;Hip    Right/Left Hip  Right    Right Hip Flexion  4/5    Right/Left Knee  Right    Right Knee Flexion  4-/5    Right Knee Extension  4/5                  [facility] Adult PT Treatment/Exercise - 05/26/17 0001      Knee/Hip Exercises: Aerobic   Nustep  L5 x17 min      Knee/Hip Exercises: Standing   Heel Raises  Both;20 reps B toe raise x20 reps    Hip Abduction  AROM;Both;20 reps;Knee straight  Forward Step Up  Right;Step Height: 6";15 reps      Knee/Hip Exercises: Seated   Long Arc Quad  Strengthening;Right;20 reps;Weights    Long Arc Quad Weight  4 lbs.      Knee/Hip Exercises: Supine   Straight Leg Raises  AROM;Right;15 reps      Modalities   Modalities  Management consultant  IFC    Electrical Stimulation Parameters  1-10 hz x15 min    Electrical Stimulation Goals  Pain      Vasopneumatic   Number Minutes Vasopneumatic   15 minutes    Vasopnuematic Location   Knee    Vasopneumatic Pressure  Medium    Vasopneumatic Temperature   34                  PT Long Term Goals - 05/26/17 1058      PT LONG TERM GOAL #1   Title  Pt will be independent in her HEP and progression.    Time  8    Period  Weeks    Status  Achieved      PT LONG TERM GOAL #2   Title  Pt will be able to demonstrate >/= 4/5 strength in right knee to improve functional mobilty and gait.    Time  8    Period  Weeks    Status  Partially Met      PT LONG TERM GOAL #3   Title  Pt  will be able to deomonstrate step through gait pattern with no assistive device.     Time  8    Period  Weeks    Status  Achieved            Plan - 05/26/17 1205    Clinical Impression Statement  Patient has progressed fairly well and able achieve goals except MMT goal. Patient still ambulates with minimal antalgic gait but without AD. Patient ambulating stairs slowly and with slight fear of reinjury and falling. Patient's RLE MMT partially achieved but weak throughout. Patient to return to work 06/01/2017. Normal modalities response noted following removal of the modalities.    Rehab Potential  Excellent    PT Frequency  2x / week    PT Duration  6 weeks    PT Treatment/Interventions  ADLs/Self Care Home Management;Electrical Stimulation;Cryotherapy;Ultrasound;Passive range of motion;Balance training;Therapeutic exercise;Therapeutic activities;Functional mobility training;Stair training;Gait training;Patient/family education;Manual techniques;Taping    PT Next Visit Plan  D/ C summary required.    PT Home Exercise Plan  Quad sets    Consulted and Agree with Plan of Care  Patient       Patient will benefit from skilled therapeutic intervention in order to improve the following deficits and impairments:  Abnormal gait  Visit Diagnosis: Acute pain of right knee  Stiffness of right knee, not elsewhere classified  Difficulty in walking, not elsewhere classified  Muscle weakness (generalized)     Problem List Patient Active Problem List   Diagnosis Date Noted  . Cholecystitis 05/25/2015    Standley Brooking, PTA 05/26/17 12:18 PM   [facility] [address] [place], [place], [address] Phone: [phone]   Fax:  [phone]  Name: Abigail Golden MRN: [id number] Date of Birth: June 28, 1965  PHYSICAL THERAPY DISCHARGE SUMMARY  Visits from Start of Care: 12.  Current functional level related to goals / functional  outcomes: See above.   Remaining deficits: Goals achieved.   Education / Equipment: HEP. Plan: Patient agrees to discharge.  Patient goals were met. Patient is being discharged due to meeting the stated rehab goals.  ?????         Mali Applegate MPT

## 2018-01-25 ENCOUNTER — Other Ambulatory Visit: Payer: Self-pay | Admitting: Certified Nurse Midwife

## 2018-01-25 DIAGNOSIS — Z1231 Encounter for screening mammogram for malignant neoplasm of breast: Secondary | ICD-10-CM

## 2018-03-03 ENCOUNTER — Ambulatory Visit: Payer: Self-pay

## 2018-03-22 ENCOUNTER — Ambulatory Visit: Payer: Self-pay

## 2018-03-25 ENCOUNTER — Ambulatory Visit
Admission: RE | Admit: 2018-03-25 | Discharge: 2018-03-25 | Disposition: A | Discharge status: Home / Self Care | Payer: BC Managed Care – PPO | Source: Ambulatory Visit | Attending: Certified Nurse Midwife | Admitting: Certified Nurse Midwife

## 2018-03-25 DIAGNOSIS — Z1231 Encounter for screening mammogram for malignant neoplasm of breast: Secondary | ICD-10-CM

## 2018-07-07 ENCOUNTER — Other Ambulatory Visit: Payer: Self-pay | Admitting: Family Medicine

## 2018-07-07 DIAGNOSIS — R42 Dizziness and giddiness: Secondary | ICD-10-CM

## 2018-07-07 DIAGNOSIS — H539 Unspecified visual disturbance: Secondary | ICD-10-CM

## 2018-07-07 DIAGNOSIS — G500 Trigeminal neuralgia: Secondary | ICD-10-CM

## 2018-07-15 ENCOUNTER — Ambulatory Visit
Admission: RE | Admit: 2018-07-15 | Discharge: 2018-07-15 | Disposition: A | Discharge status: Home / Self Care | Payer: BC Managed Care – PPO | Source: Ambulatory Visit | Attending: Family Medicine | Admitting: Family Medicine

## 2018-07-15 DIAGNOSIS — H539 Unspecified visual disturbance: Secondary | ICD-10-CM

## 2018-07-15 DIAGNOSIS — R42 Dizziness and giddiness: Secondary | ICD-10-CM

## 2018-07-15 DIAGNOSIS — G500 Trigeminal neuralgia: Secondary | ICD-10-CM

## 2018-09-08 ENCOUNTER — Other Ambulatory Visit: Payer: Self-pay

## 2018-09-08 ENCOUNTER — Encounter: Payer: Self-pay | Admitting: Neurology

## 2018-09-08 ENCOUNTER — Ambulatory Visit: Payer: BC Managed Care – PPO | Admitting: Neurology

## 2018-09-08 VITALS — BP 138/82 | HR 67 | Ht 61.0 in | Wt 265.0 lb

## 2018-09-08 DIAGNOSIS — R51 Headache: Secondary | ICD-10-CM | POA: Diagnosis not present

## 2018-09-08 DIAGNOSIS — G4440 Drug-induced headache, not elsewhere classified, not intractable: Secondary | ICD-10-CM | POA: Diagnosis not present

## 2018-09-08 DIAGNOSIS — G43709 Chronic migraine without aura, not intractable, without status migrainosus: Secondary | ICD-10-CM

## 2018-09-08 DIAGNOSIS — G8929 Other chronic pain: Secondary | ICD-10-CM

## 2018-09-08 DIAGNOSIS — R519 Headache, unspecified: Secondary | ICD-10-CM

## 2018-09-08 DIAGNOSIS — I7771 Dissection of carotid artery: Secondary | ICD-10-CM

## 2018-09-08 DIAGNOSIS — G441 Vascular headache, not elsewhere classified: Secondary | ICD-10-CM

## 2018-09-08 DIAGNOSIS — G9001 Carotid sinus syncope: Secondary | ICD-10-CM

## 2018-09-08 NOTE — Patient Instructions (Addendum)
CT of the blood vessels of the head and neck to ensure no problems with Carotid Artery or any other arteries Sleep Apnea? Morning headaches?  Continue the Citalopram, we can further increase as well - email me Try to stop the Daily analgesics Read the literature I provided and other migraine preventatives we use (Topiramate, Aimovig etc)    Sleep Apnea Sleep apnea is a condition in which breathing pauses or becomes shallow during sleep. Episodes of sleep apnea usually last 10 seconds or longer, and they may occur as many as 20 times an hour. Sleep apnea disrupts your sleep and keeps your body from getting the rest that it needs. This condition can increase your risk of certain health problems, including:  Heart attack.  Stroke.  Obesity.  Diabetes.  Heart failure.  Irregular heartbeat. There are three kinds of sleep apnea:  Obstructive sleep apnea. This kind is caused by a blocked or collapsed airway.  Central sleep apnea. This kind happens when the part of the brain that controls breathing does not send the correct signals to the muscles that control breathing.  Mixed sleep apnea. This is a combination of obstructive and central sleep apnea. What are the causes? The most common cause of this condition is a collapsed or blocked airway. An airway can collapse or become blocked if:  Your throat muscles are abnormally relaxed.  Your tongue and tonsils are larger than normal.  You are overweight.  Your airway is smaller than normal. What increases the risk? This condition is more likely to develop in people who:  Are overweight.  Smoke.  Have a smaller than normal airway.  Are elderly.  Are female.  Drink alcohol.  Take sedatives or tranquilizers.  Have a family history of sleep apnea. What are the signs or symptoms? Symptoms of this condition include:  Trouble staying asleep.  Daytime sleepiness and tiredness.  Irritability.  Loud snoring.  Morning  headaches.  Trouble concentrating.  Forgetfulness.  Decreased interest in sex.  Unexplained sleepiness.  Mood swings.  Personality changes.  Feelings of depression.  Waking up often during the night to urinate.  Dry mouth.  Sore throat. How is this diagnosed? This condition may be diagnosed with:  A medical history.  A physical exam.  A series of tests that are done while you are sleeping (sleep study). These tests are usually done in a sleep lab, but they may also be done at home. How is this treated? Treatment for this condition aims to restore normal breathing and to ease symptoms during sleep. It may involve managing health issues that can affect breathing, such as high blood pressure or obesity. Treatment may include:  Sleeping on your side.  Using a decongestant if you have nasal congestion.  Avoiding the use of depressants, including alcohol, sedatives, and narcotics.  Losing weight if you are overweight.  Making changes to your diet.  Quitting smoking.  Using a device to open your airway while you sleep, such as: ? An oral appliance. This is a custom-made mouthpiece that shifts your lower jaw forward. ? A continuous positive airway pressure (CPAP) device. This device delivers oxygen to your airway through a mask. ? A nasal expiratory positive airway pressure (EPAP) device. This device has valves that you put into each nostril. ? A bi-level positive airway pressure (BPAP) device. This device delivers oxygen to your airway through a mask.  Surgery if other treatments do not work. During surgery, excess tissue is removed to create a wider  airway. It is important to get treatment for sleep apnea. Without treatment, this condition can lead to:  High blood pressure.  Coronary artery disease.  (Men) An inability to achieve or maintain an erection (impotence).  Reduced thinking abilities. Follow these instructions at home:  Make any lifestyle changes that  your health care provider recommends.  Eat a healthy, well-balanced diet.  Take over-the-counter and prescription medicines only as told by your health care provider.  Avoid using depressants, including alcohol, sedatives, and narcotics.  Take steps to lose weight if you are overweight.  If you were given a device to open your airway while you sleep, use it only as told by your health care provider.  Do not use any tobacco products, such as cigarettes, chewing tobacco, and e-cigarettes. If you need help quitting, ask your health care provider.  Keep all follow-up visits as told by your health care provider. This is important. Contact a health care provider if:  The device that you received to open your airway during sleep is uncomfortable or does not seem to be working.  Your symptoms do not improve.  Your symptoms get worse. Get help right away if:  You develop chest pain.  You develop shortness of breath.  You develop discomfort in your back, arms, or stomach.  You have trouble speaking.  You have weakness on one side of your body.  You have drooping in your face. These symptoms may represent a serious problem that is an emergency. Do not wait to see if the symptoms will go away. Get medical help right away. Call your local emergency services (911 in the [place] Do not drive yourself to the hospital. This information is not intended to replace advice given to you by your health care provider. Make sure you discuss any questions you have with your health care provider. Document Released: 05/30/2002 Document Revised: 01/05/2017 Document Reviewed: 03/19/2015 Elsevier Interactive Patient Education  2019 [employer].   Migraine Headache A migraine headache is an intense, throbbing pain on one side or both sides of the head. Migraines may also cause other symptoms, such as nausea, vomiting, and sensitivity to light and noise. What are the causes? Doing or taking certain  things may also trigger migraines, such as: Alcohol. Smoking. Medicines, such as: Medicine used to treat chest pain (nitroglycerine). Birth control pills. Estrogen pills. Certain blood pressure medicines. Aged cheeses, chocolate, or caffeine. Foods or drinks that contain nitrates, glutamate, aspartame, or tyramine. Physical activity. Other things that may trigger a migraine include: Menstruation. Pregnancy. Hunger. Stress, lack of sleep, too much sleep, or fatigue. Weather changes. What increases the risk? The following factors may make you more likely to experience migraine headaches: Age. Risk increases with age. Family history of migraine headaches. Being Caucasian. Depression and anxiety. Obesity. Being a woman. Having a hole in the heart (patent foramen ovale) or other heart problems. What are the signs or symptoms? The main symptom of this condition is pulsating or throbbing pain. Pain may: Happen in any area of the head, such as on one side or both sides. Interfere with daily activities. Get worse with physical activity. Get worse with exposure to bright lights or loud noises. Other symptoms may include: Nausea. Vomiting. Dizziness. General sensitivity to bright lights, loud noises, or smells. Before you get a migraine, you may get warning signs that a migraine is developing (aura). An aura may include: Seeing flashing lights or having blind spots. Seeing bright spots, halos, or zigzag lines. Having tunnel  vision or blurred vision. Having numbness or a tingling feeling. Having trouble talking. Having muscle weakness. How is this diagnosed? A migraine headache can be diagnosed based on: Your symptoms. A physical exam. Tests, such as CT scan or MRI of the head. These imaging tests can help rule out other causes of headaches. Taking fluid from the spine (lumbar puncture) and analyzing it (cerebrospinal fluid analysis, or CSF analysis). How is this treated? A  migraine headache is usually treated with medicines that: Relieve pain. Relieve nausea. Prevent migraines from coming back. Treatment may also include: Acupuncture. Lifestyle changes like avoiding foods that trigger migraines. Follow these instructions at home: Medicines Take over-the-counter and prescription medicines only as told by your health care provider. Do not drive or use heavy machinery while taking prescription pain medicine. To prevent or treat constipation while you are taking prescription pain medicine, your health care provider may recommend that you: Drink enough fluid to keep your urine clear or pale yellow. Take over-the-counter or prescription medicines. Eat foods that are high in fiber, such as fresh fruits and vegetables, whole grains, and beans. Limit foods that are high in fat and processed sugars, such as fried and sweet foods. Lifestyle Avoid alcohol use. Do not use any products that contain nicotine or tobacco, such as cigarettes and e-cigarettes. If you need help quitting, ask your health care provider. Get at least 8 hours of sleep every night. Limit your stress. General instructions     Keep a journal to find out what may trigger your migraine headaches. For example, write down: What you eat and drink. How much sleep you get. Any change to your diet or medicines. If you have a migraine: Avoid things that make your symptoms worse, such as bright lights. It may help to lie down in a dark, quiet room. Do not drive or use heavy machinery. Ask your health care provider what activities are safe for you while you are experiencing symptoms. Keep all follow-up visits as told by your health care provider. This is important. Contact a health care provider if: You develop symptoms that are different or more severe than your usual migraine symptoms. Get help right away if: Your migraine becomes severe. You have a fever. You have a stiff neck. You have vision  loss. Your muscles feel weak or like you cannot control them. You start to lose your balance often. You develop trouble walking. You faint. This information is not intended to replace advice given to you by your health care provider. Make sure you discuss any questions you have with your health care provider. Document Released: 06/09/2005 Document Revised: 12/28/2015 Document Reviewed: 11/26/2015 Elsevier Interactive Patient Education  2019 [employer].   Analgesic Rebound Headache An analgesic rebound headache, sometimes called a medication overuse headache, is a headache that comes after pain medicine (analgesic) taken to treat the original (primary) headache has worn off. Any type of primary headache can return as a rebound headache if a person regularly takes analgesics more than three times a week to treat it. The types of primary headaches that are commonly associated with rebound headaches include:  Migraines.  Headaches that arise from tense muscles in the head and neck area (tension headaches).  Headaches that develop and happen again (recur) on one side of the head and around the eye (cluster headaches). If rebound headaches continue, they become chronic daily headaches. What are the causes? This condition may be caused by frequent use of:  Over-the-counter medicines such as aspirin,  ibuprofen, and acetaminophen.  Sinus relief medicines and other medicines that contain caffeine.  Narcotic pain medicines such as codeine and oxycodone. What are the signs or symptoms? The symptoms of a rebound headache are the same as the symptoms of the original headache. Some of the symptoms of specific types of headaches include: Migraine headache  Pulsing or throbbing pain on one or both sides of the head.  Severe pain that interferes with daily activities.  Pain that is worsened by physical activity.  Nausea, vomiting, or both.  Pain with exposure to bright light, loud noises, or  strong smells.  General sensitivity to bright light, loud noises, or strong smells.  Visual changes.  Numbness of one or both arms. Tension headache  Pressure around the head.  Dull, aching head pain.  Pain felt over the front and sides of the head.  Tenderness in the muscles of the head, neck, and shoulders. Cluster headache  Severe pain that begins in or around one eye or temple.  Redness and tearing in the eye on the same side as the pain.  Droopy or swollen eyelid.  One-sided head pain.  Nausea.  Runny nose.  Sweaty, pale facial skin.  Restlessness. How is this diagnosed? This condition is diagnosed by:  Reviewing your medical history. This includes the nature of your primary headaches.  Reviewing the types of pain medicines that you have been using to treat your headaches and how often you take them. How is this treated? This condition may be treated or managed by:  Discontinuing frequent use of the analgesic medicine. Doing this may worsen your headaches at first, but the pain should eventually become more manageable, less frequent, and less severe.  Seeing a headache specialist. He or she may be able to help you manage your headaches and help make sure there is not another cause of the headaches.  Using methods of stress relief, such as acupuncture, counseling, biofeedback, and massage. Talk with your health care provider about which methods might be good for you. Follow these instructions at home:  Take over-the-counter and prescription medicines only as told by your health care provider.  Stop the repeated use of pain medicine as told by your health care provider. Stopping can be difficult. Carefully follow instructions from your health care provider.  Avoid triggers that are known to cause your primary headaches.  Keep all follow-up visits as told by your health care provider. This is important. Contact a health care provider if:  You continue to  experience headaches after following treatments that your health care provider recommended. Get help right away if:  You develop new headache pain.  You develop headache pain that is different than what you have experienced in the past.  You develop numbness or tingling in your arms or legs.  You develop changes in your speech or vision. This information is not intended to replace advice given to you by your health care provider. Make sure you discuss any questions you have with your health care provider. Document Released: 08/30/2003 Document Revised: 12/28/2015 Document Reviewed: 11/12/2015 Elsevier Interactive Patient Education  2019 [employer].

## 2018-09-08 NOTE — Progress Notes (Signed)
[facility]    Provider:  Dr Jaynee Eagles Requesting Provider: Aletha Halim., PA-C Primary Care Provider:  Aletha Halim., PA-C  CC: Headaches and migraines  HPI:  Abigail Golden is a 53 y.o. female here as requested by Aletha Halim., PA-C for migraines.  Patient has a past medical history of dizziness, visual changes, primary stabbing headache, trigeminal neuralgia and migraines.  She is currently on eletriptan for acute management of migraines. She has had migraines since the third grade, in high school she was diagnosed with hypoglycemia and they thought this was related to the headaches which went away. Through her adult life she would have headaches, after hysterectomy in 2005 the migraines worsened, eletriptan helps. She is waking up with migraines and headaches. If she takes relpax and ibuprofen helps. In December she started having a different type of migraine. Migraines are all the way across the forehead, pulsating, pounding, throbbing, light and sound sensitivity, nausea an vomiting. She has been waking up with the headache more. She has shooting pain in the temple, it affects her eye, her eye stays closed and even behind the eye it zaps into the neck especially when she moves her neck. She also has skin sensitivity. Chewing was a trigger but she also feels like her eye is blurry, unclear if autonomic symptoms.  She usually wakes with her headaches.  Also dizziness, pre-syncope, almost lost consciousness. She cannot tell me how many days in the month she has a headache but since December she has had very frequent headaches. She feels the citalopram has helped since she started on 3/9 she feels better. Prednisone helped. Tizanidine helped as well. She does not know whether she snores, she lives alone but she is morbidly obese. No known family history of migraines. She does not have significant fatigue during the day. Noise can trigger her headaches. Relpax works. She  doesn't use more than givem each month and she feels this is effective. She has daily headaches, she takes ibuprofen every day.   Tried: citalopram, relpax, butalbital.   Reviewed notes, labs and imaging from outside physicians, which showed:  Reviewed notes from [facility].  Patient has a past medical history of dizziness, visual changes, primary stabbing headache, trigeminal neuralgia and migraines.  She is also been on Norco, prednisone, tizanidine, Decadron.  Examination, reviewed which was largely unremarkable except for tenderness along the right side of the forehead and around the ear without rashes.  Patient presented with dizziness, CBC and differential, TSH, MRI of the brain without contrast were ordered and she was treated with prednisone taper and a dexamethasone injection.  Similarly she was treated for her visual changes, primary stabbing headache and trigeminal neuralgia.  She was instructed to hydrate well, start the prednisone taper the next day, take a muscle relaxer and go to the ER if increasing headache, dizziness, visual changes or communication issues.  I do not have the results of the lab testing and we will contact [place] office and request those.  Reviewed MRI of the brain without contrast which was largely unremarkable, reviewed imaging and agree with findings.  I reviewed patient's hand written notes several pages.  Week of 06/07/2018 on 1218 started with a migraine in the p.m., continued into 1219, on Sunday the 22nd she woke up at 5 AM with a major migraine, sick, nauseated, went to church and went back home and slept until 2 PM, she continued to have a headache on and off for several  days.  In January Sunday, January 12 in the a.m. she felt lightheaded, almost passed out in the shower, felt dizzy, a different type of headache, right side of her head, blurred vision, dizzy, right temple, forehead and behind the ear, shooting pain down the right side of face  and neck.  On the same day when getting up from a sitting position she almost fell forward, dizzy and nauseated, it scared her and she called a friend.  On the 14th she saw her physician who gave her prednisone and ordered an MRI also gave her tizanidine.  MRI was normal.  Citalopram was called in 1 at bedtime.  All the labs look very good per report but over the next several days she continued to have headache behind the right eye and pain down the neck.  In March March 9, headache started, shooting pain, right IN side of face, continued until bedtime, periodically continued with headache pain on the right side some symptoms continued, Wednesday 318 woke up at 5 AM with a combination headache and took medication.    Review of Systems: Patient complains of symptoms per HPI as well as the following symptoms: headache. Pertinent negatives and positives per HPI. All others negative.   Social History   Socioeconomic History   Marital status: Single    Spouse name: Not on file   Number of children: 0   Years of education: 2 years of college   Highest education level: Not on file  Occupational History   Not on file  Social Needs   Financial resource strain: Not on file   Food insecurity:    Worry: Not on file    Inability: Not on file   Transportation needs:    Medical: Not on file    Non-medical: Not on file  Tobacco Use   Smoking status: Never Smoker   Smokeless tobacco: Never Used  Substance and Sexual Activity   Alcohol use: Never    Frequency: Never   Drug use: Never   Sexual activity: Not on file  Lifestyle   Physical activity:    Days per week: Not on file    Minutes per session: Not on file   Stress: Not on file  Relationships   Social connections:    Talks on phone: Not on file    Gets together: Not on file    Attends religious service: Not on file    Active member of club or organization: Not on file    Attends meetings of clubs or organizations: Not  on file    Relationship status: Not on file   Intimate partner violence:    Fear of current or ex partner: Not on file    Emotionally abused: Not on file    Physically abused: Not on file    Forced sexual activity: Not on file  Other Topics Concern   Not on file  Social History Narrative   Lives at home alone   Caffeine: 16 oz daily    Family History  Problem Relation Age of Onset   Heart disease Mother    Delorse Limber White syndrome Father    Yves Dill Parkinson White syndrome Paternal Grandmother    Diabetes Paternal Grandmother    Breast cancer Neg Hx     Past Medical History:  Diagnosis Date   Allergy    Anxiety    Chronic bronchitis ([place])    Depression    Headache    Hypoglycemia    Lower back  pain    since fall ~ 2012 (05/25/2015)   Migraine    05/25/2015 "probably once/month"   Migraine headache    Pain in left hip    since fall ~ 2012 (05/25/2015)   Pneumonia "several times"    Patient Active Problem List   Diagnosis Date Noted   Chronic intractable headache 09/09/2018   Chronic migraine without aura without status migrainosus, not intractable 09/09/2018   Morbid obesity ([place]) 09/09/2018   Medication overuse headache 09/09/2018   Cholecystitis 05/25/2015    Past Surgical History:  Procedure Laterality Date   ABDOMINAL HYSTERECTOMY  11/2002   "found benign tumors"   CHOLECYSTECTOMY N/A 05/25/2015   Procedure: LAPAROSCOPIC CHOLECYSTECTOMY;  Surgeon: Ralene Ok, MD;  Location: [facility];  Service: General;  Laterality: N/A;   LAPAROSCOPIC CHOLECYSTECTOMY  05/25/2015   MENISCUS REPAIR Right 03/2017    Current Outpatient Medications  Medication Sig Dispense Refill   ALPRAZolam (XANAX) 0.25 MG tablet Take 0.25 mg by mouth every 4 (four) hours as needed. for anxiety     citalopram (CELEXA) 10 MG tablet Take 10 mg by mouth at bedtime.     diclofenac sodium (VOLTAREN) 1 % GEL Apply topically as needed. To affected area      diphenhydrAMINE (BENADRYL) 25 MG tablet Take 25-50 mg by mouth. Every 4-6 hours     eletriptan (RELPAX) 20 MG tablet Take 20 mg by mouth as needed for headache (at onset of migraine.). May repeat in 2 hours if needed.     estradiol (ESTRACE) 2 MG tablet Take 2 mg by mouth at bedtime.      fluticasone (FLONASE) 50 MCG/ACT nasal spray Place 2 sprays into both nostrils daily.     HYDROcodone-acetaminophen (NORCO/VICODIN) 5-325 MG tablet Take 1 tablet by mouth every 4 (four) hours as needed. (Patient taking differently: Take 1-2 tablets by mouth every 4 (four) hours as needed (pain). ) 15 tablet 0   hydrOXYzine (ATARAX/VISTARIL) 25 MG tablet Take 25 mg by mouth 3 (three) times daily as needed for itching.     ibuprofen (ADVIL,MOTRIN) 200 MG tablet Take 800 mg by mouth 3 (three) times daily as needed for mild pain.      loratadine (CLARITIN) 10 MG tablet Take 10 mg by mouth daily.     montelukast (SINGULAIR) 10 MG tablet Take 10 mg by mouth at bedtime.     ondansetron (ZOFRAN-ODT) 4 MG disintegrating tablet Take 4 mg by mouth every 8 (eight) hours as needed for nausea or vomiting.     solifenacin (VESICARE) 10 MG tablet Take 10 mg by mouth every evening.     No current facility-administered medications for this visit.     Allergies as of 09/08/2018 - Review Complete 09/08/2018  Allergen Reaction Noted   Celebrex [celecoxib] Rash 11/12/2012   Minocycline Rash 11/12/2012    Vitals: BP 138/82 (BP Location: Left Arm, Patient Position: Sitting)    Pulse 67    Ht _0  (1.549 m)    Wt 265 lb (120.2 kg)    BMI 50.07 kg/m  Last Weight:  Wt Readings from Last 1 Encounters:  09/08/18 265 lb (120.2 kg)   Last Height:   Ht Readings from Last 1 Encounters:  09/08/18 _1  (1.549 m)     Physical exam: Exam: Gen: NAD, conversant, well nourised, morbidly obese, well groomed                     CV: RRR, no MRG. No Carotid  Bruits. No peripheral edema, warm, nontender Eyes: Conjunctivae  clear without exudates or hemorrhage  Neuro: Detailed Neurologic Exam  Speech:    Speech is normal; fluent and spontaneous with normal comprehension.  Cognition:    The patient is oriented to person, place, and time;     recent and remote memory intact;     language fluent;     normal attention, concentration,     fund of knowledge Cranial Nerves:    The pupils are equal, round, and reactive to light. The fundi are normal and spontaneous venous pulsations are present. Visual fields are full to finger confrontation. Extraocular movements are intact. Trigeminal sensation is intact and the muscles of mastication are normal. The face is symmetric. The palate elevates in the midline. Hearing intact. Voice is normal. Shoulder shrug is normal. The tongue has normal motion without fasciculations.   Coordination:    Normal finger to nose and heel to shin. Normal rapid alternating movements.   Gait:    Heel-toe and tandem gait are normal.   Motor Observation:    No asymmetry, no atrophy, and no involuntary movements noted. Tone:    Normal muscle tone.    Posture:    Posture is normal. normal erect    Strength:    Strength is V/V in the upper and lower limbs.      Sensation: intact to LT     Reflex Exam:  DTR's:    Deep tendon reflexes in the upper and lower extremities are normal bilaterally.   Toes:    The toes are downgoing bilaterally.   Clonus:    Clonus is absent.    Assessment/Plan:  Chronic daily headaches. Likely due to chronic migraine and medication overuse. However she need further imaging due to neuropathic shooting pain in the area of the carotid arteries to ensure no other etiology such as dissection. We discussed sleep apnea or hypoventilation syndrome (she is morbidly obese) as a cause of morning headaches as well, her ESS is 10 and she will consider evaluation. Neuro exam normal. MRI brain unremarkable.   CTA of the head and neck: neuropathic shooting pain in the  area of the carotid arteries to ensure no other etiology such as dissection. Maybe a component of trigeminal neuralgia however it would be uncommon for this pain to radiate down the neck as patient describes (points to the carotid artery course)  Morning headaches: We discussed sleep apnea or hypoventilation syndrome (she is morbidly obese) as a cause of morning headaches as well, her ESS is 10 and she will consider evaluation. Discussed weight loss and recommended Healthy Weight and [place].   Migraine prevention: She is feeling improved on Citalopram, just started it a little over a week ago. She can continue with this, prescribed by pcp but we can increase it if she likes, she is to email me ina few weeks with update. Discussed other options such as Topiramate next. Gave her extensive literature on migraines for her to read.   I had a long discussion with patient that her daily use of ibuprofen can cause medication overuse/rebound headache which is likely contributing to her chronic daily headaches, also can damage vital organs. They only thing to do is to stop the medication unfortunately. She will hopefully improve with her chronic daily headaches after 2-4 weeks of being off her daily over-the-counter medications. Do not use these medications more than 2 times in a week.  - highly unlikely GCA but can check ESR and  CRP, discussed with patient  - She is following up with ophthalmology this month, she has dry eyes and her eyes do water but she feels improved.   Discussed: To prevent or relieve headaches, try the following: Cool Compress. Lie down and place a cool compress on your head.  Avoid headache triggers. If certain foods or odors seem to have triggered your migraines in the past, avoid them. A headache diary might help you identify triggers.  Include physical activity in your daily routine. Try a daily walk or other moderate aerobic exercise.  Manage stress. Find healthy ways to  cope with the stressors, such as delegating tasks on your to-do list.  Practice relaxation techniques. Try deep breathing, yoga, massage and visualization.  Eat regularly. Eating regularly scheduled meals and maintaining a healthy diet might help prevent headaches. Also, drink plenty of fluids.  Follow a regular sleep schedule. Sleep deprivation might contribute to headaches Consider biofeedback. With this mind-body technique, you learn to control certain bodily functions -- such as muscle tension, heart rate and blood pressure -- to prevent headaches or reduce headache pain.    Proceed to emergency room if you experience new or worsening symptoms or symptoms do not resolve, if you have new neurologic symptoms or if headache is severe, or for any concerning symptom.   Provided education and documentation from American headache Society toolbox including articles on: chronic migraine medication overuse headache, chronic migraines, prevention of migraines, behavioral and other nonpharmacologic treatments for headache.   Orders Placed This Encounter  Procedures   CT ANGIO HEAD W OR WO CONTRAST   CT ANGIO NECK W OR WO CONTRAST   Sedimentation rate   C-reactive protein   Basic Metabolic Panel     Cc: Aletha Halim., PA-C,    Sarina Ill, MD  [facility] [address] [place], [place] [address]  Phone [phone] Fax [phone]

## 2018-09-09 ENCOUNTER — Telehealth: Payer: Self-pay | Admitting: Neurology

## 2018-09-09 ENCOUNTER — Encounter: Payer: Self-pay | Admitting: Neurology

## 2018-09-09 DIAGNOSIS — R51 Headache: Secondary | ICD-10-CM

## 2018-09-09 DIAGNOSIS — G43709 Chronic migraine without aura, not intractable, without status migrainosus: Secondary | ICD-10-CM | POA: Insufficient documentation

## 2018-09-09 DIAGNOSIS — G8929 Other chronic pain: Secondary | ICD-10-CM | POA: Insufficient documentation

## 2018-09-09 DIAGNOSIS — G4440 Drug-induced headache, not elsewhere classified, not intractable: Secondary | ICD-10-CM | POA: Insufficient documentation

## 2018-09-09 DIAGNOSIS — R519 Headache, unspecified: Secondary | ICD-10-CM | POA: Insufficient documentation

## 2018-09-09 NOTE — Telephone Encounter (Signed)
[employer] Auth: [id number] (exp. 09/09/18 to 10/08/18) order sent to GI. They will reach out to the pt to schedule.

## 2018-12-07 ENCOUNTER — Telehealth: Payer: Self-pay | Admitting: *Deleted

## 2018-12-07 NOTE — Telephone Encounter (Signed)
Called pt and LVM asking for call back re: 6/18 appt. Asked if she would be able to see Amy NP. If pt unable to move appointment, ok to leave for 3 pm. However advised pt we are doing telephone and VV d/t the pandemic. Asked for call back by 3 pm tomorrow 6/17 or appt will be canceled and pt will need to r/s for next available. Left office number in message.

## 2018-12-08 NOTE — Telephone Encounter (Signed)
I spoke with the patient. She has not had her CT-A. She also stated she is better, feels the Citalopram may be helping. She also stopped taking Ibuprofen. She stated she only takes tylenol PRN. We discussed her appt tomorrow. Pt decided to cancel appt, schedule CT-A head & neck with GI and then call back to r/s the follow up.

## 2018-12-09 ENCOUNTER — Ambulatory Visit: Payer: BC Managed Care – PPO | Admitting: Neurology

## 2019-03-02 ENCOUNTER — Other Ambulatory Visit: Payer: Self-pay | Admitting: Certified Nurse Midwife

## 2019-03-02 DIAGNOSIS — Z1231 Encounter for screening mammogram for malignant neoplasm of breast: Secondary | ICD-10-CM

## 2019-04-15 ENCOUNTER — Ambulatory Visit
Admission: RE | Admit: 2019-04-15 | Discharge: 2019-04-15 | Disposition: A | Discharge status: Home / Self Care | Payer: BC Managed Care – PPO | Source: Ambulatory Visit | Attending: Certified Nurse Midwife | Admitting: Certified Nurse Midwife

## 2019-04-15 ENCOUNTER — Other Ambulatory Visit: Payer: Self-pay

## 2019-04-15 DIAGNOSIS — Z1231 Encounter for screening mammogram for malignant neoplasm of breast: Secondary | ICD-10-CM

## 2020-03-06 ENCOUNTER — Other Ambulatory Visit: Payer: Self-pay | Admitting: Certified Nurse Midwife

## 2020-04-12 ENCOUNTER — Other Ambulatory Visit: Payer: Self-pay | Admitting: Certified Nurse Midwife

## 2020-04-12 DIAGNOSIS — Z1231 Encounter for screening mammogram for malignant neoplasm of breast: Secondary | ICD-10-CM

## 2020-05-21 ENCOUNTER — Ambulatory Visit
Admission: RE | Admit: 2020-05-21 | Discharge: 2020-05-21 | Disposition: A | Discharge status: Home / Self Care | Payer: BC Managed Care – PPO | Source: Ambulatory Visit | Attending: Certified Nurse Midwife | Admitting: Certified Nurse Midwife

## 2020-05-21 ENCOUNTER — Other Ambulatory Visit: Payer: Self-pay

## 2020-05-21 DIAGNOSIS — Z1231 Encounter for screening mammogram for malignant neoplasm of breast: Secondary | ICD-10-CM

## 2021-04-22 ENCOUNTER — Other Ambulatory Visit: Payer: Self-pay

## 2021-04-22 ENCOUNTER — Other Ambulatory Visit: Payer: Self-pay | Admitting: Family Medicine

## 2021-04-22 ENCOUNTER — Encounter (HOSPITAL_COMMUNITY): Payer: Self-pay | Admitting: Emergency Medicine

## 2021-04-22 ENCOUNTER — Ambulatory Visit
Admission: RE | Admit: 2021-04-22 | Discharge: 2021-04-22 | Disposition: A | Discharge status: Home / Self Care | Payer: BC Managed Care – PPO | Source: Ambulatory Visit | Attending: Family Medicine | Admitting: Family Medicine

## 2021-04-22 ENCOUNTER — Emergency Department (HOSPITAL_COMMUNITY)
Admission: EM | Admit: 2021-04-22 | Discharge: 2021-04-23 | Disposition: A | Discharge status: Home / Self Care | Payer: BC Managed Care – PPO | Source: Home / Self Care | Attending: Emergency Medicine | Admitting: Emergency Medicine

## 2021-04-22 DIAGNOSIS — Z5321 Procedure and treatment not carried out due to patient leaving prior to being seen by health care provider: Secondary | ICD-10-CM | POA: Insufficient documentation

## 2021-04-22 DIAGNOSIS — R42 Dizziness and giddiness: Secondary | ICD-10-CM | POA: Insufficient documentation

## 2021-04-22 DIAGNOSIS — C3412 Malignant neoplasm of upper lobe, left bronchus or lung: Secondary | ICD-10-CM | POA: Diagnosis not present

## 2021-04-22 DIAGNOSIS — G939 Disorder of brain, unspecified: Secondary | ICD-10-CM | POA: Diagnosis not present

## 2021-04-22 DIAGNOSIS — R519 Headache, unspecified: Secondary | ICD-10-CM

## 2021-04-22 LAB — COMPREHENSIVE METABOLIC PANEL
ALT: 13 U/L (ref 0–44)
AST: 16 U/L (ref 15–41)
Albumin: 3.6 g/dL (ref 3.5–5.0)
Alkaline Phosphatase: 55 U/L (ref 38–126)
Anion gap: 7 (ref 5–15)
BUN: 11 mg/dL (ref 6–20)
CO2: 26 mmol/L (ref 22–32)
Calcium: 9 mg/dL (ref 8.9–10.3)
Chloride: 104 mmol/L (ref 98–111)
Creatinine, Ser: 0.94 mg/dL (ref 0.44–1.00)
GFR, Estimated: >60 mL/min (ref >60)
Glucose, Bld: 104 mg/dL — ABNORMAL HIGH (ref 70–99)
Potassium: 3.7 mmol/L (ref 3.5–5.1)
Sodium: 137 mmol/L (ref 135–145)
Total Bilirubin: 0.7 mg/dL (ref 0.3–1.2)
Total Protein: 6.9 g/dL (ref 6.5–8.1)

## 2021-04-22 LAB — CBC WITH DIFFERENTIAL/PLATELET
Abs Immature Granulocytes: 0.02 10*3/uL (ref 0.00–0.07)
Basophils Absolute: 0 10*3/uL (ref 0.0–0.1)
Basophils Relative: 0 %
Eosinophils Absolute: 0 10*3/uL (ref 0.0–0.5)
Eosinophils Relative: 0 %
HCT: 38.5 % (ref 36.0–46.0)
Hemoglobin: 12.7 g/dL (ref 12.0–15.0)
Immature Granulocytes: 0 %
Lymphocytes Relative: 52 %
Lymphs Abs: 2.8 10*3/uL (ref 0.7–4.0)
MCH: 28.9 pg (ref 26.0–34.0)
MCHC: 33 g/dL (ref 30.0–36.0)
MCV: 87.5 fL (ref 80.0–100.0)
Monocytes Absolute: 0.5 10*3/uL (ref 0.1–1.0)
Monocytes Relative: 9 %
Neutro Abs: 2.2 10*3/uL (ref 1.7–7.7)
Neutrophils Relative %: 39 %
Platelets: 236 10*3/uL (ref 150–400)
RBC: 4.4 MIL/uL (ref 3.87–5.11)
RDW: 12.6 % (ref 11.5–15.5)
WBC: 5.5 10*3/uL (ref 4.0–10.5)
nRBC: 0 % (ref 0.0–0.2)

## 2021-04-22 NOTE — ED Triage Notes (Signed)
Patient here from PCP office, she has been having headaches, they sent her for outpt CT scans and two masses were found.  PCP office sent for MRI of brain.

## 2021-04-22 NOTE — ED Provider Notes (Signed)
Emergency Medicine Provider Triage Evaluation Note  Abigail Golden , a 55 y.o. female  was evaluated in triage.  Pt complains of headaches x10 days.  Patient seen at her primary care and had outpatient CT scans, 2 masses were found and the radiologist recommended MRI of her brain.  She initially thought her headache was related to her typical migraines.  She is complaining of numbness in the back of her head, and photophobia.  No falls or LOC.  Review of Systems  Positive: Headache, photophobia, dizziness Negative: Numbness, tingling  Physical Exam  BP 126/62 (BP Location: Left Arm)   Pulse 63   Temp 97.8 F (36.6 C) (Oral)   Resp 16   SpO2 98%  Gen:   Awake, no distress  Resp:  Normal effort  MSK:   Moves extremities without difficulty  Other:    Medical Decision Making  Medically screening exam initiated at 8:27 PM.  Appropriate orders placed.  [name] was informed that the remainder of the evaluation will be completed by another provider, this initial triage assessment does not replace that evaluation, and the importance of remaining in the ED until their evaluation is complete.  Plan to obtain labs and MRI brain   [name] 04/22/21 2028    [name], MD 04/23/21 [phone]

## 2021-04-23 ENCOUNTER — Inpatient Hospital Stay (HOSPITAL_COMMUNITY)
Admission: EM | Admit: 2021-04-23 | Discharge: 2021-04-30 | DRG: 166 | Disposition: A | Discharge status: Home / Self Care | Payer: BC Managed Care – PPO | Attending: Internal Medicine | Admitting: Internal Medicine

## 2021-04-23 ENCOUNTER — Observation Stay (HOSPITAL_COMMUNITY): Payer: BC Managed Care – PPO

## 2021-04-23 ENCOUNTER — Telehealth (HOSPITAL_COMMUNITY): Payer: Self-pay | Admitting: Radiology

## 2021-04-23 ENCOUNTER — Encounter (HOSPITAL_COMMUNITY): Payer: Self-pay | Admitting: Internal Medicine

## 2021-04-23 ENCOUNTER — Emergency Department (HOSPITAL_COMMUNITY): Payer: BC Managed Care – PPO

## 2021-04-23 DIAGNOSIS — Z90710 Acquired absence of both cervix and uterus: Secondary | ICD-10-CM

## 2021-04-23 DIAGNOSIS — Z79890 Hormone replacement therapy (postmenopausal): Secondary | ICD-10-CM

## 2021-04-23 DIAGNOSIS — R900 Intracranial space-occupying lesion found on diagnostic imaging of central nervous system: Secondary | ICD-10-CM

## 2021-04-23 DIAGNOSIS — Z20822 Contact with and (suspected) exposure to covid-19: Secondary | ICD-10-CM | POA: Diagnosis present

## 2021-04-23 DIAGNOSIS — C7931 Secondary malignant neoplasm of brain: Secondary | ICD-10-CM | POA: Diagnosis present

## 2021-04-23 DIAGNOSIS — Z886 Allergy status to analgesic agent status: Secondary | ICD-10-CM

## 2021-04-23 DIAGNOSIS — G9389 Other specified disorders of brain: Secondary | ICD-10-CM

## 2021-04-23 DIAGNOSIS — G936 Cerebral edema: Secondary | ICD-10-CM | POA: Diagnosis present

## 2021-04-23 DIAGNOSIS — Z419 Encounter for procedure for purposes other than remedying health state, unspecified: Secondary | ICD-10-CM

## 2021-04-23 DIAGNOSIS — F419 Anxiety disorder, unspecified: Secondary | ICD-10-CM | POA: Diagnosis present

## 2021-04-23 DIAGNOSIS — Z888 Allergy status to other drugs, medicaments and biological substances status: Secondary | ICD-10-CM

## 2021-04-23 DIAGNOSIS — J95811 Postprocedural pneumothorax: Secondary | ICD-10-CM | POA: Diagnosis not present

## 2021-04-23 DIAGNOSIS — Z7982 Long term (current) use of aspirin: Secondary | ICD-10-CM

## 2021-04-23 DIAGNOSIS — Z4682 Encounter for fitting and adjustment of non-vascular catheter: Secondary | ICD-10-CM

## 2021-04-23 DIAGNOSIS — J302 Other seasonal allergic rhinitis: Secondary | ICD-10-CM | POA: Diagnosis present

## 2021-04-23 DIAGNOSIS — G43709 Chronic migraine without aura, not intractable, without status migrainosus: Secondary | ICD-10-CM | POA: Diagnosis present

## 2021-04-23 DIAGNOSIS — J939 Pneumothorax, unspecified: Secondary | ICD-10-CM

## 2021-04-23 DIAGNOSIS — R918 Other nonspecific abnormal finding of lung field: Secondary | ICD-10-CM | POA: Diagnosis present

## 2021-04-23 DIAGNOSIS — F32A Depression, unspecified: Secondary | ICD-10-CM | POA: Diagnosis present

## 2021-04-23 DIAGNOSIS — Z8249 Family history of ischemic heart disease and other diseases of the circulatory system: Secondary | ICD-10-CM

## 2021-04-23 DIAGNOSIS — R55 Syncope and collapse: Secondary | ICD-10-CM | POA: Diagnosis present

## 2021-04-23 DIAGNOSIS — Z6841 Body Mass Index (BMI) 40.0 and over, adult: Secondary | ICD-10-CM

## 2021-04-23 DIAGNOSIS — Z79899 Other long term (current) drug therapy: Secondary | ICD-10-CM

## 2021-04-23 DIAGNOSIS — Z833 Family history of diabetes mellitus: Secondary | ICD-10-CM

## 2021-04-23 DIAGNOSIS — G4700 Insomnia, unspecified: Secondary | ICD-10-CM | POA: Diagnosis not present

## 2021-04-23 DIAGNOSIS — C3412 Malignant neoplasm of upper lobe, left bronchus or lung: Principal | ICD-10-CM | POA: Diagnosis present

## 2021-04-23 LAB — RESP PANEL BY RT-PCR (FLU A&B, COVID) ARPGX2
Influenza A by PCR: NEGATIVE
Influenza B by PCR: NEGATIVE
SARS Coronavirus 2 by RT PCR: NEGATIVE

## 2021-04-23 MED ORDER — MONTELUKAST SODIUM 10 MG PO TABS
10.0000 mg | ORAL_TABLET | Freq: Every day | ORAL | Status: DC
  Administered 2021-04-23 – 2021-04-29 (×7): 10 mg via ORAL
  Filled 2021-04-23 (×8): qty 1

## 2021-04-23 MED ORDER — ACETAMINOPHEN 325 MG PO TABS: 650.0000 mg | ORAL_TABLET | Freq: Four times a day (QID) | ORAL | Status: DC | PRN

## 2021-04-23 MED ORDER — LORATADINE 10 MG PO TABS
10.0000 mg | ORAL_TABLET | Freq: Every day | ORAL | Status: DC
  Administered 2021-04-24 – 2021-04-30 (×7): 10 mg via ORAL
  Filled 2021-04-23 (×7): qty 1

## 2021-04-23 MED ORDER — CITALOPRAM HYDROBROMIDE 20 MG PO TABS
10.0000 mg | ORAL_TABLET | Freq: Every day | ORAL | Status: DC
  Administered 2021-04-23 – 2021-04-29 (×7): 10 mg via ORAL
  Filled 2021-04-23 (×7): qty 1

## 2021-04-23 MED ORDER — ACETAMINOPHEN 650 MG RE SUPP: 650.0000 mg | Freq: Four times a day (QID) | RECTAL | Status: DC | PRN

## 2021-04-23 MED ORDER — DEXAMETHASONE SODIUM PHOSPHATE 10 MG/ML IJ SOLN
10.0000 mg | Freq: Once | INTRAMUSCULAR | Status: AC
  Administered 2021-04-23: 10 mg via INTRAVENOUS
  Filled 2021-04-23: qty 1

## 2021-04-23 MED ORDER — ALPRAZOLAM 0.25 MG PO TABS
0.2500 mg | ORAL_TABLET | ORAL | Status: DC | PRN
  Administered 2021-04-24 – 2021-04-25 (×2): 0.25 mg via ORAL
  Filled 2021-04-23 (×2): qty 1

## 2021-04-23 MED ORDER — DIPHENHYDRAMINE HCL 25 MG PO CAPS
25.0000 mg | ORAL_CAPSULE | Freq: Every day | ORAL | Status: DC | PRN
  Administered 2021-04-27: 25 mg via ORAL
  Filled 2021-04-23: qty 1

## 2021-04-23 MED ORDER — HYDROXYZINE HCL 25 MG PO TABS: 25.0000 mg | ORAL_TABLET | Freq: Every day | ORAL | Status: DC | PRN

## 2021-04-23 MED ORDER — DARIFENACIN HYDROBROMIDE ER 7.5 MG PO TB24
7.5000 mg | ORAL_TABLET | Freq: Every day | ORAL | Status: DC
  Administered 2021-04-23 – 2021-04-29 (×7): 7.5 mg via ORAL
  Filled 2021-04-23 (×9): qty 1

## 2021-04-23 MED ORDER — IOHEXOL 300 MG/ML  SOLN
80.0000 mL | Freq: Once | INTRAMUSCULAR | Status: AC | PRN
  Administered 2021-04-23: 80 mL via INTRAVENOUS

## 2021-04-23 MED ORDER — IOHEXOL 9 MG/ML PO SOLN
ORAL | Status: AC
  Filled 2021-04-23: qty 1000

## 2021-04-23 MED ORDER — HYDROXYZINE HCL 25 MG PO TABS
25.0000 mg | ORAL_TABLET | Freq: Every day | ORAL | Status: DC
  Administered 2021-04-24 – 2021-04-29 (×6): 25 mg via ORAL
  Filled 2021-04-23 (×7): qty 1

## 2021-04-23 MED ORDER — DIPHENHYDRAMINE HCL 25 MG PO CAPS
25.0000 mg | ORAL_CAPSULE | Freq: Every day | ORAL | Status: DC
  Administered 2021-04-23 – 2021-04-29 (×7): 25 mg via ORAL
  Filled 2021-04-23 (×7): qty 1

## 2021-04-23 MED ORDER — ENOXAPARIN SODIUM 40 MG/0.4ML IJ SOSY
40.0000 mg | PREFILLED_SYRINGE | Freq: Every day | INTRAMUSCULAR | Status: DC
  Administered 2021-04-23: 40 mg via SUBCUTANEOUS
  Filled 2021-04-23: qty 0.4

## 2021-04-23 MED ORDER — GADOBUTROL 1 MMOL/ML IV SOLN
8.0000 mL | Freq: Once | INTRAVENOUS | Status: AC | PRN
  Administered 2021-04-23: 8 mL via INTRAVENOUS

## 2021-04-23 MED ORDER — DEXAMETHASONE SODIUM PHOSPHATE 4 MG/ML IJ SOLN
4.0000 mg | Freq: Four times a day (QID) | INTRAMUSCULAR | Status: DC
  Administered 2021-04-24 – 2021-04-28 (×17): 4 mg via INTRAVENOUS
  Filled 2021-04-23 (×16): qty 1

## 2021-04-23 NOTE — ED Triage Notes (Signed)
Pt here yesterday and had MRI done to evaluate for masses found on her brain but LWBS. Pt was called this morning by PCP and told to return to ED.

## 2021-04-23 NOTE — ED Provider Notes (Signed)
Emergency Medicine Provider Triage Evaluation Note  Abigail Golden , a 55 y.o. female  was evaluated in triage.  Pt complains of ongoing headaches over the last week.  She was seen and evaluated for her headaches that are different than her migraines.  She had an MRI of the brain which revealed multiple masses.  She left before getting those results.  She was told by her primary care provider to come back for further evaluation.  Review of Systems  Positive:  Negative: See above   Physical Exam  BP (!) 158/78 (BP Location: Left Arm)   Pulse 71   Temp 98.9 F (37.2 C)   Resp 18   SpO2 97%  Gen:   Awake, no distress   Resp:  Normal effort  MSK:   Moves extremities without difficulty  Other:    Medical Decision Making  Medically screening exam initiated at 5:23 PM.  Appropriate orders placed.  Abigail Golden was informed that the remainder of the evaluation will be completed by another provider, this initial triage assessment does not replace that evaluation, and the importance of remaining in the ED until their evaluation is complete.     Myna Bright [place], PA-C 04/23/21 1725    Lucrezia Starch, MD 04/26/21 [phone]

## 2021-04-23 NOTE — ED Notes (Addendum)
IV team ordered for difficult IV stick and blood draw.

## 2021-04-23 NOTE — ED Notes (Signed)
Called pt no answer and dont see pt in lobby

## 2021-04-23 NOTE — ED Notes (Signed)
Called no answer and dont see pt in lobby

## 2021-04-23 NOTE — H&P (Signed)
History and Physical    Abigail Golden XVQ:[id number] DOB: 12/07/1965 DOA: 04/23/2021  PCP: Aletha Halim., PA-C  Patient coming from: Home.  Chief Complaint: Headache.  HPI: Abigail Golden is a 55 y.o. female with history of migraine and allergies presents to the ER after outpatient CT scan showed intracranial mass concerning for neoplasm and was referred by patient's primary care physician.  Patient has history of migraines and has been experiencing occipital headache which has been progressively getting higher intensity and frequent and character of the pain was not typical of migraine.  At times patient with any nausea vomiting did not have any loss of function of the upper or lower extremities.  Had some photophobia.  Denies any weakness of the extremities.  ED Course: In the ER patient had MRI brain which shows intracranial mass with vasogenic edema concerning for metastatic disease.  Neurology and neurosurgery has been consulted patient was started on Decadron.  Plan is to get a CT chest abdomen pelvis to look for primary and admit for further work-up.  Labs are unremarkable.  COVID test was negative.  Review of Systems: As per HPI, rest all negative.   Past Medical History:  Diagnosis Date   Allergy    Anxiety    Chronic bronchitis ([place])    Depression    Headache    Hypoglycemia    Lower back pain    since fall ~ 2012 (05/25/2015)   Migraine    05/25/2015 "probably once/month"   Migraine headache    Pain in left hip    since fall ~ 2012 (05/25/2015)   Pneumonia "several times"    Past Surgical History:  Procedure Laterality Date   ABDOMINAL HYSTERECTOMY  11/2002   "found benign tumors"   CHOLECYSTECTOMY N/A 05/25/2015   Procedure: LAPAROSCOPIC CHOLECYSTECTOMY;  Surgeon: Ralene Ok, MD;  Location: [place];  Service: General;  Laterality: N/A;   LAPAROSCOPIC CHOLECYSTECTOMY  05/25/2015   MENISCUS REPAIR Right 03/2017     reports that she has never smoked. She has  never used smokeless tobacco. She reports that she does not drink alcohol and does not use drugs.  Allergies  Allergen Reactions   Celebrex [Celecoxib] Rash    On face itching   Minocycline Rash    On face Itching    Family History  Problem Relation Age of Onset   Heart disease Mother    Delorse Limber White syndrome Father    Yves Dill Parkinson White syndrome Paternal Grandmother    Diabetes Paternal Grandmother    Breast cancer Neg Hx     Prior to Admission medications   Medication Sig Start Date End Date Taking? Authorizing Provider  albuterol (VENTOLIN HFA) 108 (90 Base) MCG/ACT inhaler Inhale 2 puffs into the lungs every 4 (four) hours as needed. 06/21/19  Yes [provider]  aspirin EC 81 MG tablet Take 81 mg by mouth daily. Swallow whole.   Yes [provider]  citalopram (CELEXA) 10 MG tablet Take 10 mg by mouth at bedtime. 08/16/18  Yes [provider]  eletriptan (RELPAX) 20 MG tablet Take 20 mg by mouth as needed for headache (at onset of migraine.). May repeat in 2 hours if needed.   Yes [provider]  estradiol (ESTRACE) 2 MG tablet Take 2 mg by mouth at bedtime.    Yes [provider]  fluticasone (FLONASE) 50 MCG/ACT nasal spray Place 2 sprays into both nostrils daily as needed for allergies. 02/12/17  Yes  [provider]  montelukast (SINGULAIR) 10 MG tablet Take 10 mg by mouth at bedtime.   Yes [provider]  ondansetron (ZOFRAN-ODT) 4 MG disintegrating tablet Take 4 mg by mouth every 8 (eight) hours as needed for nausea or vomiting.   Yes [provider]  solifenacin (VESICARE) 10 MG tablet Take 10 mg by mouth every evening.   Yes [provider]  ALPRAZolam (XANAX) 0.25 MG tablet Take 0.25 mg by mouth every 4 (four) hours as needed for anxiety. for anxiety 07/16/18   [provider]  Diclofenac Potassium,Migraine, (CAMBIA) 50 MG PACK Take 1 packet by mouth 2 (two) times daily  as needed.    [provider]  diclofenac sodium (VOLTAREN) 1 % GEL Apply topically as needed. To affected area    [provider]  diphenhydrAMINE (BENADRYL) 25 MG tablet Take 25 mg by mouth See admin instructions. 25 mg at bedtime 25 mg as needed for itching    [provider]  HYDROcodone-acetaminophen (NORCO/VICODIN) 5-325 MG tablet Take 1 tablet by mouth every 4 (four) hours as needed. Patient not taking: Reported on 04/23/2021 02/26/17   Barnet Glasgow, NP  hydrOXYzine (ATARAX/VISTARIL) 25 MG tablet Take 25 mg by mouth 3 (three) times daily as needed for itching.    [provider]  ibuprofen (ADVIL,MOTRIN) 200 MG tablet Take 800 mg by mouth 3 (three) times daily as needed for mild pain.     [provider]  loratadine (CLARITIN) 10 MG tablet Take 10 mg by mouth daily.    [provider]    Physical Exam: Constitutional: Moderately built and nourished. Vitals:   04/23/21 1659 04/23/21 1930  BP: (!) 158/78 (!) 153/73  Pulse: 71 70  Resp: 18 13  Temp: 98.9 F (37.2 C)   SpO2: 97% 98%   Eyes: Anicteric no pallor. ENMT: No discharge from the ears eyes nose and mouth. Neck: No mass felt.  No neck rigidity. Respiratory: No rhonchi or crepitations. Cardiovascular: S1-S2 heard. Abdomen: Soft nontender bowel sound present. Musculoskeletal: No edema. Skin: No rash. Neurologic: Alert awake oriented to time place and person.  Moves all extremities 5 x 5.  No facial asymmetry tongue is midline.  Pupils are equal and reactive to light. Psychiatric: Appears normal.  Normal affect.   Labs on Admission: I have personally reviewed following labs and imaging studies  CBC: Recent Labs  Lab 04/22/21 2042  WBC 5.5  NEUTROABS 2.2  HGB 12.7  HCT 38.5  MCV 87.5  PLT 174   Basic Metabolic Panel: Recent Labs  Lab 04/22/21 2042  NA 137  K 3.7  CL 104  CO2 26  GLUCOSE 104*  BUN 11  CREATININE 0.94  CALCIUM 9.0   GFR: CrCl  cannot be calculated (Unknown ideal weight.). Liver Function Tests: Recent Labs  Lab 04/22/21 2042  AST 16  ALT 13  ALKPHOS 55  BILITOT 0.7  PROT 6.9  ALBUMIN 3.6   No results for input(s): LIPASE, AMYLASE in the last 168 hours. No results for input(s): AMMONIA in the last 168 hours. Coagulation Profile: No results for input(s): INR, PROTIME in the last 168 hours. Cardiac Enzymes: No results for input(s): CKTOTAL, CKMB, CKMBINDEX, TROPONINI in the last 168 hours. BNP (last 3 results) No results for input(s): PROBNP in the last 8760 hours. HbA1C: No results for input(s): HGBA1C in the last 72 hours. CBG: No results for input(s): GLUCAP in the last 168 hours. Lipid Profile: No results for input(s): CHOL,  HDL, LDLCALC, TRIG, CHOLHDL, LDLDIRECT in the last 72 hours. Thyroid Function Tests: No results for input(s): TSH, T4TOTAL, FREET4, T3FREE, THYROIDAB in the last 72 hours. Anemia Panel: No results for input(s): VITAMINB12, FOLATE, FERRITIN, TIBC, IRON, RETICCTPCT in the last 72 hours. Urine analysis:    Component Value Date/Time   COLORURINE YELLOW 05/24/2015 2158   APPEARANCEUR CLEAR 05/24/2015 2158   LABSPEC 1.016 05/24/2015 2158   PHURINE 5.5 05/24/2015 2158   GLUCOSEU NEGATIVE 05/24/2015 2158   HGBUR NEGATIVE 05/24/2015 2158   BILIRUBINUR NEGATIVE 05/24/2015 2158   [place] NEGATIVE 05/24/2015 2158   PROTEINUR NEGATIVE 05/24/2015 2158   NITRITE NEGATIVE 05/24/2015 2158   LEUKOCYTESUR NEGATIVE 05/24/2015 2158   Sepsis Labs: @LABRCNTIP (procalcitonin:4,lacticidven:4) ) Recent Results (from the past 240 hour(s))  Resp Panel by RT-PCR (Flu A&B, Covid) Nasopharyngeal Swab     Status: None   Collection Time: 04/23/21  5:27 PM   Specimen: Nasopharyngeal Swab; Nasopharyngeal(NP) swabs in vial transport medium  Result Value Ref Range Status   SARS Coronavirus 2 by RT PCR NEGATIVE NEGATIVE Final    Comment: (NOTE) SARS-CoV-2 target nucleic acids are NOT DETECTED.  The  SARS-CoV-2 RNA is generally detectable in upper respiratory specimens during the acute phase of infection. The lowest concentration of SARS-CoV-2 viral copies this assay can detect is 138 copies/mL. A negative result does not preclude SARS-Cov-2 infection and should not be used as the sole basis for treatment or other patient management decisions. A negative result may occur with  improper specimen collection/handling, submission of specimen other than nasopharyngeal swab, presence of viral mutation(s) within the areas targeted by this assay, and inadequate number of viral copies(<138 copies/mL). A negative result must be combined with clinical observations, patient history, and epidemiological information. The expected result is Negative.  Fact Sheet for Patients:  EntrepreneurPulse.com.au  Fact Sheet for Healthcare Providers:  IncredibleEmployment.be  This test is no t yet approved or cleared by the [place] FDA and  has been authorized for detection and/or diagnosis of SARS-CoV-2 by FDA under an Emergency Use Authorization (EUA). This EUA will remain  in effect (meaning this test can be used) for the duration of the COVID-19 declaration under Section 564(b)(1) of the Act, 21 U.S.C.section 360bbb-3(b)(1), unless the authorization is terminated  or revoked sooner.       Influenza A by PCR NEGATIVE NEGATIVE Final   Influenza B by PCR NEGATIVE NEGATIVE Final    Comment: (NOTE) The Xpert Xpress SARS-CoV-2/FLU/RSV plus assay is intended as an aid in the diagnosis of influenza from Nasopharyngeal swab specimens and should not be used as a sole basis for treatment. Nasal washings and aspirates are unacceptable for Xpert Xpress SARS-CoV-2/FLU/RSV testing.  Fact Sheet for Patients: EntrepreneurPulse.com.au  Fact Sheet for Healthcare Providers: IncredibleEmployment.be  This test is not yet approved or  cleared by the [place] FDA and has been authorized for detection and/or diagnosis of SARS-CoV-2 by FDA under an Emergency Use Authorization (EUA). This EUA will remain in effect (meaning this test can be used) for the duration of the COVID-19 declaration under Section 564(b)(1) of the Act, 21 U.S.C. section 360bbb-3(b)(1), unless the authorization is terminated or revoked.  Performed at [facility], [place] [address] [place], [place] [address]      Radiological Exams on Admission: CT HEAD WO CONTRAST (5MM)  Result Date: 04/22/2021 CLINICAL DATA:  Worsening headaches.  Dizziness and lightheaded. EXAM: CT HEAD WITHOUT CONTRAST TECHNIQUE: Contiguous axial images were obtained from the base of the  skull through the vertex without intravenous contrast. COMPARISON:  Brain MRI 07/15/2018 FINDINGS: Brain: No abnormality seen affecting the brainstem or cerebellum. There are 2 separate areas of vasogenic edema, 1 within the inferior left frontal region in the other at the left parietal vertex. There are presumed underlying masses in both of those areas, though they are not clearly depicted. No evidence of hemorrhage. No left-to-right shift. No abnormality seen in the right hemisphere. No extra-axial fluid collection. The differential diagnosis includes venous infarctions and cerebritis. MRI with contrast strongly recommended. Vascular: No abnormal vascular finding. Skull: Normal Sinuses/Orbits: Clear/normal Other: None IMPRESSION: Two separate areas of vasogenic edema within the left hemisphere, 1 within the inferior frontal region in the other at the left parietal vertex. Metastatic disease would be the most likely cause of this appearance. Other possibilities include venous infarctions and cerebritis. MRI with contrast recommended. Call report in progress. Electronically Signed   By: Nelson Chimes M.D.   On: 04/22/2021 16:07   MR Brain W and Wo Contrast  Result Date: 04/23/2021 CLINICAL DATA:   55 year old female with increasing headaches. Multifocal vasogenic edema in the brain on plain head CT yesterday. EXAM: MRI HEAD WITHOUT AND WITH CONTRAST TECHNIQUE: Multiplanar, multiecho pulse sequences of the brain and surrounding structures were obtained without and with intravenous contrast. CONTRAST:  57mL GADAVIST GADOBUTROL 1 MMOL/ML IV SOLN COMPARISON:  Head CT 04/22/2021. Brain MRI without contrast 07/15/2018. FINDINGS: Brain: Solid enhancing 16 mm mass in the posterosuperior left parietal lobe with moderate regional vasogenic edema (series 18, image 41 and series 20, image 18). Only mild regional mass effect. Large up to 29 mm rim enhancing cystic or necrotic mass in the left hemisphere at the inferior frontal gyrus junction with the temporal lobe (series 18, image 22 and series 19, image 21. Moderate vasogenic edema in the region but only mild regional mass effect - including on the anterior left temporal tip. Punctate 2-3 mm abnormal enhancement in the posterior right temporal lobe seen on both series 20, image 6 and series 19, image 14. Subtle similar right inferior frontal gyrus lesion best seen on series 18, image 24 and series 20, image 13. No associated edema. And suspicion of 2 similar small nodular areas of enhancement in the superior frontal gyri abutting the midline. See series 18, image 46 and series 19, images 19 and 17. Less likely these could be dural based. No edema. No dural thickening or enhancement elsewhere. No superimposed restricted diffusion to suggest acute infarction. No midline shift, ventriculomegaly, extra-axial collection or acute intracranial hemorrhage. Cervicomedullary junction and pituitary are within normal limits. Normal background gray and white matter signal. No cortical encephalomalacia or chronic cerebral blood products. Vascular: Major intracranial vascular flow voids in the are stable since 2020. Dominant left vertebral artery. The major dural venous sinuses are  enhancing and appear to be patent. Skull and upper cervical spine: Negative. Visualized bone marrow signal is within normal limits. Sinuses/Orbits: Negative. Other: Trace right mastoid fluid. Negative nasopharynx. Negative visible scalp and face. IMPRESSION: 1. Metastatic disease to the brain. Two enhancing brain masses with moderate associated vasogenic edema: solid 16 mm left parietal lobe mass, and a larger 29 mm rim enhancing cystic or necrotic mass in the left inferior frontal gyrus. Two other small 2-3 mm metastases in the right inferior frontal gyrus, right posterior temporal lobe. And questionable two additional punctate metastases in both superior frontal gyri abutting the falx (series 18, image 46). 2. No significant intracranial mass effect. No other  intracranial abnormality. Electronically Signed   By: Genevie Ann M.D.   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  Neurologist and neurosurgery has been consulted.  CT scan of the chest abdomen pelvis has been ordered to look for primary.  Further recommendations based on which. History of allergies on montelukast. History of migraines but as per the patient this headache was different.   DVT prophylaxis: Lovenox. Code Status: Full code. Family Communication: Discussed with patient. Disposition Plan: Home. Consults called: Neurology and neurosurgery. Admission status: Observation.   Rise Patience MD [facility] Pager [phone].  If 7PM-7AM, please contact night-coverage [email or web address] Password [facility]  04/23/2021, 9:23 PM

## 2021-04-23 NOTE — Telephone Encounter (Signed)
Received a call from Rush Barer explaining that patient had been in the ED for many hours and was feeling so bad that she did not feel like she could sit there any longer. She contacted her PCP who has instructed her to go back to the ED. The patient is currently in route back to [facility] ED. Velora Mediate

## 2021-04-23 NOTE — H&P (Incomplete)
History and Physical    Abigail Golden BSW:[id number] DOB: [birth year] DOA: 04/23/2021  PCP: Aletha Halim., PA-C  Patient coming from: ***  Chief Complaint: ***  HPI: Abigail Golden is a 55 y.o. female with ***   ED Course: ***  Review of Systems: As per HPI, rest all negative.   Past Medical History:  Diagnosis Date   Allergy    Anxiety    Chronic bronchitis ([place])    Depression    Headache    Hypoglycemia    Lower back pain    since fall ~ 2012 (05/25/2015)   Migraine    05/25/2015 "probably once/month"   Migraine headache    Pain in left hip    since fall ~ 2012 (05/25/2015)   Pneumonia "several times"    Past Surgical History:  Procedure Laterality Date   ABDOMINAL HYSTERECTOMY  11/2002   "found benign tumors"   CHOLECYSTECTOMY N/A 05/25/2015   Procedure: LAPAROSCOPIC CHOLECYSTECTOMY;  Surgeon: Ralene Ok, MD;  Location: [facility];  Service: General;  Laterality: N/A;   LAPAROSCOPIC CHOLECYSTECTOMY  05/25/2015   MENISCUS REPAIR Right 03/2017     reports that she has never smoked. She has never used smokeless tobacco. She reports that she does not drink alcohol and does not use drugs.  Allergies  Allergen Reactions   Celebrex [Celecoxib] Rash    On face itching   Minocycline Rash    On face Itching    Family History  Problem Relation Age of Onset   Heart disease Mother    Delorse Limber White syndrome Father    Abigail Golden Parkinson White syndrome Paternal Grandmother    Diabetes Paternal Grandmother    Breast cancer Neg Hx     Prior to Admission medications   Medication Sig Start Date End Date Taking? Authorizing Provider  acetaminophen (TYLENOL) 500 MG tablet Take 500 mg by mouth every 6 (six) hours as needed for moderate pain or headache.   Yes [provider]  albuterol (VENTOLIN HFA) 108 (90 Base) MCG/ACT inhaler Inhale 2 puffs into the lungs every 4 (four) hours as needed. 06/21/19  Yes [provider]  ALPRAZolam (XANAX)  0.25 MG tablet Take 0.25 mg by mouth every 4 (four) hours as needed for anxiety. for anxiety 07/16/18  Yes [provider]  aspirin EC 81 MG tablet Take 81 mg by mouth daily. Swallow whole.   Yes [provider]  citalopram (CELEXA) 10 MG tablet Take 10 mg by mouth at bedtime. 08/16/18  Yes [provider]  Diclofenac Potassium,Migraine, (CAMBIA) 50 MG PACK Take 1 packet by mouth 2 (two) times daily as needed (migraine).   Yes [provider]  diclofenac sodium (VOLTAREN) 1 % GEL Apply topically as needed. To affected area   Yes [provider]  diphenhydrAMINE (BENADRYL) 25 MG tablet Take 25 mg by mouth See admin instructions. 25 mg at bedtime 25 mg as needed for itching   Yes [provider]  eletriptan (RELPAX) 20 MG tablet Take 20 mg by mouth as needed for headache (at onset of migraine.). May repeat in 2 hours if needed.   Yes [provider]  estradiol (ESTRACE) 2 MG tablet Take 2 mg by mouth at bedtime.    Yes [provider]  fluticasone (FLONASE) 50 MCG/ACT nasal spray Place 2 sprays into both nostrils daily as needed for allergies. 02/12/17  Yes [provider]  hydrOXYzine (ATARAX/VISTARIL) 25 MG tablet Take 25 mg by mouth See  admin instructions. 25 mg at bedtime 25 mg during the day as needed for itching/anxiety   Yes [provider]  loratadine (CLARITIN) 10 MG tablet Take 10 mg by mouth daily.   Yes [provider]  montelukast (SINGULAIR) 10 MG tablet Take 10 mg by mouth at bedtime.   Yes [provider]  ondansetron (ZOFRAN-ODT) 4 MG disintegrating tablet Take 4 mg by mouth every 8 (eight) hours as needed for nausea or vomiting.   Yes [provider]  solifenacin (VESICARE) 10 MG tablet Take 10 mg by mouth every evening.   Yes [provider]  HYDROcodone-acetaminophen (NORCO/VICODIN) 5-325 MG tablet Take 1 tablet by mouth every 4 (four) hours as needed. Patient  not taking: Reported on 04/23/2021 02/26/17   Barnet Glasgow, NP    Physical Exam: Constitutional: *** Vitals:   04/23/21 1659 04/23/21 1930  BP: (!) 158/78 (!) 153/73  Pulse: 71 70  Resp: 18 13  Temp: 98.9 F (37.2 C)   SpO2: 97% 98%   Eyes: *** ENMT: *** Neck: *** Respiratory: *** Cardiovascular: *** Abdomen: *** Musculoskeletal: *** Skin: *** Neurologic:*** Psychiatric: ***   Labs on Admission: I have personally reviewed following labs and imaging studies  CBC: Recent Labs  Lab 04/22/21 2042  WBC 5.5  NEUTROABS 2.2  HGB 12.7  HCT 38.5  MCV 87.5  PLT 161   Basic Metabolic Panel: Recent Labs  Lab 04/22/21 2042  NA 137  K 3.7  CL 104  CO2 26  GLUCOSE 104*  BUN 11  CREATININE 0.94  CALCIUM 9.0   GFR: CrCl cannot be calculated (Unknown ideal weight.). Liver Function Tests: Recent Labs  Lab 04/22/21 2042  AST 16  ALT 13  ALKPHOS 55  BILITOT 0.7  PROT 6.9  ALBUMIN 3.6   No results for input(s): LIPASE, AMYLASE in the last 168 hours. No results for input(s): AMMONIA in the last 168 hours. Coagulation Profile: No results for input(s): INR, PROTIME in the last 168 hours. Cardiac Enzymes: No results for input(s): CKTOTAL, CKMB, CKMBINDEX, TROPONINI in the last 168 hours. BNP (last 3 results) No results for input(s): PROBNP in the last 8760 hours. HbA1C: No results for input(s): HGBA1C in the last 72 hours. CBG: No results for input(s): GLUCAP in the last 168 hours. Lipid Profile: No results for input(s): CHOL, HDL, LDLCALC, TRIG, CHOLHDL, LDLDIRECT in the last 72 hours. Thyroid Function Tests: No results for input(s): TSH, T4TOTAL, FREET4, T3FREE, THYROIDAB in the last 72 hours. Anemia Panel: No results for input(s): VITAMINB12, FOLATE, FERRITIN, TIBC, IRON, RETICCTPCT in the last 72 hours. Urine analysis:    Component Value Date/Time   COLORURINE YELLOW 05/24/2015 2158   APPEARANCEUR CLEAR 05/24/2015 2158   LABSPEC 1.016 05/24/2015 2158    PHURINE 5.5 05/24/2015 2158   GLUCOSEU NEGATIVE 05/24/2015 2158   HGBUR NEGATIVE 05/24/2015 2158   BILIRUBINUR NEGATIVE 05/24/2015 2158   [place] NEGATIVE 05/24/2015 2158   PROTEINUR NEGATIVE 05/24/2015 2158   NITRITE NEGATIVE 05/24/2015 2158   LEUKOCYTESUR NEGATIVE 05/24/2015 2158   Sepsis Labs: @LABRCNTIP (procalcitonin:4,lacticidven:4) ) Recent Results (from the past 240 hour(s))  Resp Panel by RT-PCR (Flu A&B, Covid) Nasopharyngeal Swab     Status: None   Collection Time: 04/23/21  5:27 PM   Specimen: Nasopharyngeal Swab; Nasopharyngeal(NP) swabs in vial transport medium  Result Value Ref Range Status   SARS Coronavirus 2 by RT PCR NEGATIVE NEGATIVE Final    Comment: (NOTE) SARS-CoV-2 target nucleic acids are NOT DETECTED.  The SARS-CoV-2  RNA is generally detectable in upper respiratory specimens during the acute phase of infection. The lowest concentration of SARS-CoV-2 viral copies this assay can detect is 138 copies/mL. A negative result does not preclude SARS-Cov-2 infection and should not be used as the sole basis for treatment or other patient management decisions. A negative result may occur with  improper specimen collection/handling, submission of specimen other than nasopharyngeal swab, presence of viral mutation(s) within the areas targeted by this assay, and inadequate number of viral copies(<138 copies/mL). A negative result must be combined with clinical observations, patient history, and epidemiological information. The expected result is Negative.  Fact Sheet for Patients:  EntrepreneurPulse.com.au  Fact Sheet for Healthcare Providers:  IncredibleEmployment.be  This test is no t yet approved or cleared by the [place] FDA and  has been authorized for detection and/or diagnosis of SARS-CoV-2 by FDA under an Emergency Use Authorization (EUA). This EUA will remain  in effect (meaning this test can be used) for the  duration of the COVID-19 declaration under Section 564(b)(1) of the Act, 21 U.S.C.section 360bbb-3(b)(1), unless the authorization is terminated  or revoked sooner.       Influenza A by PCR NEGATIVE NEGATIVE Final   Influenza B by PCR NEGATIVE NEGATIVE Final    Comment: (NOTE) The Xpert Xpress SARS-CoV-2/FLU/RSV plus assay is intended as an aid in the diagnosis of influenza from Nasopharyngeal swab specimens and should not be used as a sole basis for treatment. Nasal washings and aspirates are unacceptable for Xpert Xpress SARS-CoV-2/FLU/RSV testing.  Fact Sheet for Patients: EntrepreneurPulse.com.au  Fact Sheet for Healthcare Providers: IncredibleEmployment.be  This test is not yet approved or cleared by the [place] FDA and has been authorized for detection and/or diagnosis of SARS-CoV-2 by FDA under an Emergency Use Authorization (EUA). This EUA will remain in effect (meaning this test can be used) for the duration of the COVID-19 declaration under Section 564(b)(1) of the Act, 21 U.S.C. section 360bbb-3(b)(1), unless the authorization is terminated or revoked.  Performed at [facility], [place] [address], [place], [place] [address]      Radiological Exams on Admission: CT HEAD WO CONTRAST (5MM)  Result Date: 04/22/2021 CLINICAL DATA:  Worsening headaches.  Dizziness and lightheaded. EXAM: CT HEAD WITHOUT CONTRAST TECHNIQUE: Contiguous axial images were obtained from the base of the skull through the vertex without intravenous contrast. COMPARISON:  Brain MRI 07/15/2018 FINDINGS: Brain: No abnormality seen affecting the brainstem or cerebellum. There are 2 separate areas of vasogenic edema, 1 within the inferior left frontal region in the other at the left parietal vertex. There are presumed underlying masses in both of those areas, though they are not clearly depicted. No evidence of hemorrhage. No left-to-right shift. No  abnormality seen in the right hemisphere. No extra-axial fluid collection. The differential diagnosis includes venous infarctions and cerebritis. MRI with contrast strongly recommended. Vascular: No abnormal vascular finding. Skull: Normal Sinuses/Orbits: Clear/normal Other: None IMPRESSION: Two separate areas of vasogenic edema within the left hemisphere, 1 within the inferior frontal region in the other at the left parietal vertex. Metastatic disease would be the most likely cause of this appearance. Other possibilities include venous infarctions and cerebritis. MRI with contrast recommended. Call report in progress. Electronically Signed   By: Nelson Chimes M.D.   On: 04/22/2021 16:07   MR Brain W and Wo Contrast  Result Date: 04/23/2021 CLINICAL DATA:  55 year old female with increasing headaches. Multifocal vasogenic edema in the brain on plain head CT  yesterday. EXAM: MRI HEAD WITHOUT AND WITH CONTRAST TECHNIQUE: Multiplanar, multiecho pulse sequences of the brain and surrounding structures were obtained without and with intravenous contrast. CONTRAST:  25mL GADAVIST GADOBUTROL 1 MMOL/ML IV SOLN COMPARISON:  Head CT 04/22/2021. Brain MRI without contrast 07/15/2018. FINDINGS: Brain: Solid enhancing 16 mm mass in the posterosuperior left parietal lobe with moderate regional vasogenic edema (series 18, image 41 and series 20, image 18). Only mild regional mass effect. Large up to 29 mm rim enhancing cystic or necrotic mass in the left hemisphere at the inferior frontal gyrus junction with the temporal lobe (series 18, image 22 and series 19, image 21. Moderate vasogenic edema in the region but only mild regional mass effect - including on the anterior left temporal tip. Punctate 2-3 mm abnormal enhancement in the posterior right temporal lobe seen on both series 20, image 6 and series 19, image 14. Subtle similar right inferior frontal gyrus lesion best seen on series 18, image 24 and series 20, image 13. No  associated edema. And suspicion of 2 similar small nodular areas of enhancement in the superior frontal gyri abutting the midline. See series 18, image 46 and series 19, images 19 and 17. Less likely these could be dural based. No edema. No dural thickening or enhancement elsewhere. No superimposed restricted diffusion to suggest acute infarction. No midline shift, ventriculomegaly, extra-axial collection or acute intracranial hemorrhage. Cervicomedullary junction and pituitary are within normal limits. Normal background gray and white matter signal. No cortical encephalomalacia or chronic cerebral blood products. Vascular: Major intracranial vascular flow voids in the are stable since 2020. Dominant left vertebral artery. The major dural venous sinuses are enhancing and appear to be patent. Skull and upper cervical spine: Negative. Visualized bone marrow signal is within normal limits. Sinuses/Orbits: Negative. Other: Trace right mastoid fluid. Negative nasopharynx. Negative visible scalp and face. IMPRESSION: 1. Metastatic disease to the brain. Two enhancing brain masses with moderate associated vasogenic edema: solid 16 mm left parietal lobe mass, and a larger 29 mm rim enhancing cystic or necrotic mass in the left inferior frontal gyrus. Two other small 2-3 mm metastases in the right inferior frontal gyrus, right posterior temporal lobe. And questionable two additional punctate metastases in both superior frontal gyri abutting the falx (series 18, image 46). 2. No significant intracranial mass effect. No other intracranial abnormality. Electronically Signed   By: Genevie Ann M.D.   On: 04/23/2021 06:41    EKG: Independently reviewed. ***  Assessment/Plan Principal Problem:   Intracranial mass Active Problems:   Chronic migraine without aura without status migrainosus, not intractable    ***   DVT prophylaxis: *** Code Status: ***  Family Communication: ***  Disposition Plan: ***  Consults called:  ***  Admission status: ***    Rise Patience MD [facility] Pager [phone].  If 7PM-7AM, please contact night-coverage [email or web address] Password [facility]  04/23/2021, 9:33 PM

## 2021-04-23 NOTE — ED Notes (Addendum)
Called pt no answer no answer

## 2021-04-23 NOTE — ED Provider Notes (Signed)
[facility] EMERGENCY DEPARTMENT Provider Note   CSN: [id number] Arrival date & time: 04/23/21  1516     History No chief complaint on file.   Abigail Golden is a 55 y.o. female.  55  year old female with history of migraines presents with complaint of headaches and near syncopal episodes. Reports onset of migraine (typical for her, frontal), onset 04/11/21, upon waking that morning. Patient got out of bed, took her migraine medicine and went back to bed. Patient got out of bed later feeling better and went to work. 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 Patient went to her PCP Monday for ongoing headache, now with tingling pain to her occipital area with occasional shooting pains that cause her to feel like she is light headed and about to pass out. Patient holds onto something when this happens until the episode passes. PCP ordered a CT scan which showed to brain masses and advised her to come to the ER for MRI. Patient had her MRI at 5am today but left due to extended wait and feeling generally unwell. Called by PCP today and advised to go back to the ER for further work up. Reports photophobia, symptoms have improved somewhat since lying down in a room but reports symptoms will return if she gets back out of bed and tries to do anything.  Migraines as a child, resolved until having a hysterectomy a few years ago (2004) at which time migraines returned.       Past Medical History:  Diagnosis Date   Allergy    Anxiety    Chronic bronchitis ([place])    Depression    Headache    Hypoglycemia    Lower back pain    since fall ~ 2012 (05/25/2015)   Migraine    05/25/2015 "probably once/month"   Migraine headache    Pain in left hip    since fall ~ 2012 (05/25/2015)   Pneumonia "several times"    Patient Active Problem List   Diagnosis Date Noted   Chronic intractable  headache 09/09/2018   Chronic migraine without aura without status migrainosus, not intractable 09/09/2018   Morbid obesity ([place]) 09/09/2018   Medication overuse headache 09/09/2018   Cholecystitis 05/25/2015    Past Surgical History:  Procedure Laterality Date   ABDOMINAL HYSTERECTOMY  11/2002   "found benign tumors"   CHOLECYSTECTOMY N/A 05/25/2015   Procedure: LAPAROSCOPIC CHOLECYSTECTOMY;  Surgeon: Ralene Ok, MD;  Location: [facility];  Service: General;  Laterality: N/A;   LAPAROSCOPIC CHOLECYSTECTOMY  05/25/2015   MENISCUS REPAIR Right 03/2017     OB History   No obstetric history on file.     Family History  Problem Relation Age of Onset   Heart disease Mother    Delorse Limber White syndrome Father    Yves Dill Parkinson White syndrome Paternal Grandmother    Diabetes Paternal Grandmother    Breast cancer Neg Hx     Social History   Tobacco Use   Smoking status: Never   Smokeless tobacco: Never  Substance Use Topics   Alcohol use: Never   Drug use: Never    Home Medications Prior to Admission medications   Medication Sig Start Date End Date Taking? Authorizing Provider  ALPRAZolam (XANAX) 0.25 MG tablet Take 0.25 mg by mouth every 4 (four) hours as needed. for anxiety 07/16/18   [provider]  citalopram (  CELEXA) 10 MG tablet Take 10 mg by mouth at bedtime. 08/16/18   [provider]  diclofenac sodium (VOLTAREN) 1 % GEL Apply topically as needed. To affected area    [provider]  diphenhydrAMINE (BENADRYL) 25 MG tablet Take 25-50 mg by mouth. Every 4-6 hours    [provider]  eletriptan (RELPAX) 20 MG tablet Take 20 mg by mouth as needed for headache (at onset of migraine.). May repeat in 2 hours if needed.    [provider]  estradiol (ESTRACE) 2 MG tablet Take 2 mg by mouth at bedtime.     [provider]  fluticasone (FLONASE) 50 MCG/ACT nasal spray Place 2 sprays into both nostrils daily. 02/12/17    [provider]  HYDROcodone-acetaminophen (NORCO/VICODIN) 5-325 MG tablet Take 1 tablet by mouth every 4 (four) hours as needed. Patient taking differently: Take 1-2 tablets by mouth every 4 (four) hours as needed (pain).  02/26/17   Barnet Glasgow, NP  hydrOXYzine (ATARAX/VISTARIL) 25 MG tablet Take 25 mg by mouth 3 (three) times daily as needed for itching.    [provider]  ibuprofen (ADVIL,MOTRIN) 200 MG tablet Take 800 mg by mouth 3 (three) times daily as needed for mild pain.     [provider]  loratadine (CLARITIN) 10 MG tablet Take 10 mg by mouth daily.    [provider]  montelukast (SINGULAIR) 10 MG tablet Take 10 mg by mouth at bedtime.    [provider]  ondansetron (ZOFRAN-ODT) 4 MG disintegrating tablet Take 4 mg by mouth every 8 (eight) hours as needed for nausea or vomiting.    [provider]  solifenacin (VESICARE) 10 MG tablet Take 10 mg by mouth every evening.    [provider]    Allergies    Celebrex [celecoxib] and Minocycline  Review of Systems   Review of Systems  Constitutional:  Negative for chills and fever.  Eyes:  Positive for photophobia. Negative for visual disturbance.  Respiratory:  Negative for shortness of breath.   Cardiovascular:  Negative for chest pain.  Gastrointestinal:  Negative for abdominal pain, nausea and vomiting.  Musculoskeletal:  Positive for neck pain. Negative for arthralgias and myalgias.  Skin:  Negative for rash and wound.  Allergic/Immunologic: Negative for immunocompromised state.  Neurological:  Positive for dizziness, light-headedness and headaches. Negative for facial asymmetry, speech difficulty, weakness and numbness.  Psychiatric/Behavioral:  Negative for confusion.   All other systems reviewed and are negative.  Physical Exam Updated Vital Signs BP (!) 158/78 (BP Location: Left Arm)   Pulse 71   Temp 98.9 F (37.2 C)   Resp 18   SpO2 97%    Physical Exam Vitals and nursing note reviewed.  Constitutional:      General: She is not in acute distress.    Appearance: She is well-developed. She is obese. She is not diaphoretic.     Comments: Appears uncomfortable  HENT:     Head: Normocephalic and atraumatic.     Nose: Nose normal.     Mouth/Throat:     Mouth: Mucous membranes are moist.  Eyes:     Extraocular Movements: Extraocular movements intact.     Pupils: Pupils are equal, round, and reactive to light.  Cardiovascular:     Rate and Rhythm: Normal rate and regular rhythm.     Pulses: Normal pulses.     Heart sounds: Normal heart sounds.  Pulmonary:     Effort: Pulmonary effort is  normal.     Breath sounds: Normal breath sounds.  Abdominal:     Palpations: Abdomen is soft.     Tenderness: There is no abdominal tenderness.  Musculoskeletal:     Cervical back: Neck supple.     Right lower leg: No edema.     Left lower leg: No edema.  Skin:    General: Skin is warm and dry.  Neurological:     Mental Status: She is alert and oriented to person, place, and time.     Cranial Nerves: No cranial nerve deficit.     Sensory: No sensory deficit.     Motor: No weakness.     Coordination: Coordination normal.  Psychiatric:        Behavior: Behavior normal.    ED Results / Procedures / Treatments   Labs (all labs ordered are listed, but only abnormal results are displayed) Labs Reviewed  RESP PANEL BY RT-PCR (FLU A&B, COVID) [id number]  COMPREHENSIVE METABOLIC PANEL  CBC WITH DIFFERENTIAL/PLATELET    EKG None  Radiology CT HEAD WO CONTRAST (5MM)  Result Date: 04/22/2021 CLINICAL DATA:  Worsening headaches.  Dizziness and lightheaded. EXAM: CT HEAD WITHOUT CONTRAST TECHNIQUE: Contiguous axial images were obtained from the base of the skull through the vertex without intravenous contrast. COMPARISON:  Brain MRI 07/15/2018 FINDINGS: Brain: No abnormality seen affecting the brainstem or cerebellum. There are 2  separate areas of vasogenic edema, 1 within the inferior left frontal region in the other at the left parietal vertex. There are presumed underlying masses in both of those areas, though they are not clearly depicted. No evidence of hemorrhage. No left-to-right shift. No abnormality seen in the right hemisphere. No extra-axial fluid collection. The differential diagnosis includes venous infarctions and cerebritis. MRI with contrast strongly recommended. Vascular: No abnormal vascular finding. Skull: Normal Sinuses/Orbits: Clear/normal Other: None IMPRESSION: Two separate areas of vasogenic edema within the left hemisphere, 1 within the inferior frontal region in the other at the left parietal vertex. Metastatic disease would be the most likely cause of this appearance. Other possibilities include venous infarctions and cerebritis. MRI with contrast recommended. Call report in progress. Electronically Signed   By: Nelson Chimes M.D.   On: 04/22/2021 16:07   MR Brain W and Wo Contrast  Result Date: 04/23/2021 CLINICAL DATA:  55 year old female with increasing headaches. Multifocal vasogenic edema in the brain on plain head CT yesterday. EXAM: MRI HEAD WITHOUT AND WITH CONTRAST TECHNIQUE: Multiplanar, multiecho pulse sequences of the brain and surrounding structures were obtained without and with intravenous contrast. CONTRAST:  26mL GADAVIST GADOBUTROL 1 MMOL/ML IV SOLN COMPARISON:  Head CT 04/22/2021. Brain MRI without contrast 07/15/2018. FINDINGS: Brain: Solid enhancing 16 mm mass in the posterosuperior left parietal lobe with moderate regional vasogenic edema (series 18, image 41 and series 20, image 18). Only mild regional mass effect. Large up to 29 mm rim enhancing cystic or necrotic mass in the left hemisphere at the inferior frontal gyrus junction with the temporal lobe (series 18, image 22 and series 19, image 21. Moderate vasogenic edema in the region but only mild regional mass effect - including on the  anterior left temporal tip. Punctate 2-3 mm abnormal enhancement in the posterior right temporal lobe seen on both series 20, image 6 and series 19, image 14. Subtle similar right inferior frontal gyrus lesion best seen on series 18, image 24 and series 20, image 13. No associated edema. And suspicion of 2 similar small nodular areas of enhancement  in the superior frontal gyri abutting the midline. See series 18, image 46 and series 19, images 19 and 17. Less likely these could be dural based. No edema. No dural thickening or enhancement elsewhere. No superimposed restricted diffusion to suggest acute infarction. No midline shift, ventriculomegaly, extra-axial collection or acute intracranial hemorrhage. Cervicomedullary junction and pituitary are within normal limits. Normal background gray and white matter signal. No cortical encephalomalacia or chronic cerebral blood products. Vascular: Major intracranial vascular flow voids in the are stable since 2020. Dominant left vertebral artery. The major dural venous sinuses are enhancing and appear to be patent. Skull and upper cervical spine: Negative. Visualized bone marrow signal is within normal limits. Sinuses/Orbits: Negative. Other: Trace right mastoid fluid. Negative nasopharynx. Negative visible scalp and face. IMPRESSION: 1. Metastatic disease to the brain. Two enhancing brain masses with moderate associated vasogenic edema: solid 16 mm left parietal lobe mass, and a larger 29 mm rim enhancing cystic or necrotic mass in the left inferior frontal gyrus. Two other small 2-3 mm metastases in the right inferior frontal gyrus, right posterior temporal lobe. And questionable two additional punctate metastases in both superior frontal gyri abutting the falx (series 18, image 46). 2. No significant intracranial mass effect. No other intracranial abnormality. Electronically Signed   By: Genevie Ann M.D.   On: 04/23/2021 06:41    Procedures Procedures   Medications  Ordered in ED Medications  dexamethasone (DECADRON) injection 10 mg (has no administration in time range)  dexamethasone (DECADRON) injection 4 mg (has no administration in time range)  iohexol (OMNIPAQUE) 9 MG/ML oral solution (has no administration in time range)    ED Course  I have reviewed the triage vital signs and the nursing notes.  Pertinent labs & imaging results that were available during my care of the patient were reviewed by me and considered in my medical decision making (see chart for details).  Clinical Course as of 04/23/21 1858  Tue Apr 23, 1632  [id number] 55 year old female with new diagnosis brain lesions concerning for metastatic process without prior diagnosis of cancer. Report of headaches with feeling presyncope with photophobia. Exam unremarkable with exception of photophobia.  Discussed with Dr. Malen Gauze with neuro hospitalist service, recommends Decadron 10mg  IV now, then 4mg  every 6 hours. Recommends CT chest/abdomen/pelvis for further work up, neuro surgery and oncology consultations.  [LM]  [id number] Case discussed with neurosurgery PA who will review images and call back. Patient updated with available results and plan of care. PCP with [facility], unassigned hospitalist paged for consult for admission.  [LM]  1849 Call back from Dr. Glenford Peers with neurosurgery, agrees with Decadron recommendation (10mg  now then 4mg  every 6 hours), nothing surgical from a neuro stand point but neuro surgery will follow along, hospitalist to admit.  [LM]  [place] signed out to Dr. Roslynn Amble, ER attending, at change of shift, pending consult to hospitalist for admission.  (Labs obtained in ER last night, reviewed, no significant findings) [LM]    Clinical Course User Index [LM] Roque Lias   MDM Rules/Calculators/A&P                            Final Clinical Impression(s) / ED Diagnoses Final diagnoses:  Vasogenic brain edema [facility])  Mass of brain    Rx / DC Orders ED  Discharge Orders     None        Tacy Learn, PA-C 04/23/21 1858  Lucrezia Starch, MD 04/26/21 [phone]

## 2021-04-23 NOTE — ED Notes (Signed)
Patient in MRI at this time

## 2021-04-24 ENCOUNTER — Inpatient Hospital Stay (HOSPITAL_COMMUNITY): Payer: BC Managed Care – PPO

## 2021-04-24 ENCOUNTER — Inpatient Hospital Stay (HOSPITAL_COMMUNITY): Payer: BC Managed Care – PPO | Admitting: Anesthesiology

## 2021-04-24 ENCOUNTER — Encounter (HOSPITAL_COMMUNITY): Payer: Self-pay | Admitting: Internal Medicine

## 2021-04-24 ENCOUNTER — Encounter (HOSPITAL_COMMUNITY)
Admission: EM | Disposition: A | Discharge status: Home / Self Care | Payer: Self-pay | Source: Home / Self Care | Attending: Internal Medicine

## 2021-04-24 DIAGNOSIS — G936 Cerebral edema: Secondary | ICD-10-CM | POA: Diagnosis present

## 2021-04-24 DIAGNOSIS — R918 Other nonspecific abnormal finding of lung field: Secondary | ICD-10-CM | POA: Diagnosis not present

## 2021-04-24 DIAGNOSIS — G43709 Chronic migraine without aura, not intractable, without status migrainosus: Secondary | ICD-10-CM | POA: Diagnosis present

## 2021-04-24 DIAGNOSIS — Z833 Family history of diabetes mellitus: Secondary | ICD-10-CM | POA: Diagnosis not present

## 2021-04-24 DIAGNOSIS — C3412 Malignant neoplasm of upper lobe, left bronchus or lung: Secondary | ICD-10-CM | POA: Diagnosis present

## 2021-04-24 DIAGNOSIS — F32A Depression, unspecified: Secondary | ICD-10-CM | POA: Diagnosis present

## 2021-04-24 DIAGNOSIS — Z6841 Body Mass Index (BMI) 40.0 and over, adult: Secondary | ICD-10-CM | POA: Diagnosis not present

## 2021-04-24 DIAGNOSIS — G9389 Other specified disorders of brain: Secondary | ICD-10-CM | POA: Diagnosis not present

## 2021-04-24 DIAGNOSIS — C7931 Secondary malignant neoplasm of brain: Secondary | ICD-10-CM | POA: Diagnosis present

## 2021-04-24 DIAGNOSIS — Z7982 Long term (current) use of aspirin: Secondary | ICD-10-CM | POA: Diagnosis not present

## 2021-04-24 DIAGNOSIS — R55 Syncope and collapse: Secondary | ICD-10-CM | POA: Diagnosis present

## 2021-04-24 DIAGNOSIS — J302 Other seasonal allergic rhinitis: Secondary | ICD-10-CM | POA: Diagnosis present

## 2021-04-24 DIAGNOSIS — F419 Anxiety disorder, unspecified: Secondary | ICD-10-CM | POA: Diagnosis present

## 2021-04-24 DIAGNOSIS — J939 Pneumothorax, unspecified: Secondary | ICD-10-CM | POA: Diagnosis not present

## 2021-04-24 DIAGNOSIS — R900 Intracranial space-occupying lesion found on diagnostic imaging of central nervous system: Secondary | ICD-10-CM | POA: Diagnosis not present

## 2021-04-24 DIAGNOSIS — Z79890 Hormone replacement therapy (postmenopausal): Secondary | ICD-10-CM | POA: Diagnosis not present

## 2021-04-24 DIAGNOSIS — Z888 Allergy status to other drugs, medicaments and biological substances status: Secondary | ICD-10-CM | POA: Diagnosis not present

## 2021-04-24 DIAGNOSIS — Z8249 Family history of ischemic heart disease and other diseases of the circulatory system: Secondary | ICD-10-CM | POA: Diagnosis not present

## 2021-04-24 DIAGNOSIS — J95811 Postprocedural pneumothorax: Secondary | ICD-10-CM | POA: Diagnosis not present

## 2021-04-24 DIAGNOSIS — Z886 Allergy status to analgesic agent status: Secondary | ICD-10-CM | POA: Diagnosis not present

## 2021-04-24 DIAGNOSIS — G4700 Insomnia, unspecified: Secondary | ICD-10-CM | POA: Diagnosis not present

## 2021-04-24 DIAGNOSIS — G939 Disorder of brain, unspecified: Secondary | ICD-10-CM | POA: Diagnosis present

## 2021-04-24 DIAGNOSIS — Z90710 Acquired absence of both cervix and uterus: Secondary | ICD-10-CM | POA: Diagnosis not present

## 2021-04-24 DIAGNOSIS — Z20822 Contact with and (suspected) exposure to covid-19: Secondary | ICD-10-CM | POA: Diagnosis present

## 2021-04-24 DIAGNOSIS — Z79899 Other long term (current) drug therapy: Secondary | ICD-10-CM | POA: Diagnosis not present

## 2021-04-24 HISTORY — PX: BRONCHIAL NEEDLE ASPIRATION BIOPSY: SHX5106

## 2021-04-24 HISTORY — PX: VIDEO BRONCHOSCOPY WITH ENDOBRONCHIAL NAVIGATION: SHX6175

## 2021-04-24 HISTORY — PX: BRONCHIAL BIOPSY: SHX5109

## 2021-04-24 HISTORY — PX: VIDEO BRONCHOSCOPY WITH RADIAL ENDOBRONCHIAL ULTRASOUND: SHX6849

## 2021-04-24 HISTORY — PX: CHEST TUBE INSERTION: SHX231

## 2021-04-24 HISTORY — PX: BRONCHIAL BRUSHINGS: SHX5108

## 2021-04-24 LAB — CBC WITH DIFFERENTIAL/PLATELET
Abs Immature Granulocytes: 0.03 10*3/uL (ref 0.00–0.07)
Basophils Absolute: 0 10*3/uL (ref 0.0–0.1)
Basophils Relative: 0 %
Eosinophils Absolute: 0 10*3/uL (ref 0.0–0.5)
Eosinophils Relative: 0 %
HCT: 37.8 % (ref 36.0–46.0)
Hemoglobin: 13 g/dL (ref 12.0–15.0)
Immature Granulocytes: 1 %
Lymphocytes Relative: 25 %
Lymphs Abs: 1.2 10*3/uL (ref 0.7–4.0)
MCH: 29.9 pg (ref 26.0–34.0)
MCHC: 34.4 g/dL (ref 30.0–36.0)
MCV: 86.9 fL (ref 80.0–100.0)
Monocytes Absolute: 0.1 10*3/uL (ref 0.1–1.0)
Monocytes Relative: 3 %
Neutro Abs: 3.3 10*3/uL (ref 1.7–7.7)
Neutrophils Relative %: 71 %
Platelets: 229 10*3/uL (ref 150–400)
RBC: 4.35 MIL/uL (ref 3.87–5.11)
RDW: 12.6 % (ref 11.5–15.5)
WBC: 4.6 10*3/uL (ref 4.0–10.5)
nRBC: 0 % (ref 0.0–0.2)

## 2021-04-24 LAB — COMPREHENSIVE METABOLIC PANEL
ALT: 15 U/L (ref 0–44)
AST: 17 U/L (ref 15–41)
Albumin: 3.5 g/dL (ref 3.5–5.0)
Alkaline Phosphatase: 56 U/L (ref 38–126)
Anion gap: 11 (ref 5–15)
BUN: 10 mg/dL (ref 6–20)
CO2: 18 mmol/L — ABNORMAL LOW (ref 22–32)
Calcium: 8.9 mg/dL (ref 8.9–10.3)
Chloride: 104 mmol/L (ref 98–111)
Creatinine, Ser: 0.77 mg/dL (ref 0.44–1.00)
GFR, Estimated: >60 mL/min (ref >60)
Glucose, Bld: 166 mg/dL — ABNORMAL HIGH (ref 70–99)
Potassium: 4.3 mmol/L (ref 3.5–5.1)
Sodium: 133 mmol/L — ABNORMAL LOW (ref 135–145)
Total Bilirubin: 0.7 mg/dL (ref 0.3–1.2)
Total Protein: 6.9 g/dL (ref 6.5–8.1)

## 2021-04-24 LAB — HIV ANTIBODY (ROUTINE TESTING W REFLEX): HIV Screen 4th Generation wRfx: NONREACTIVE

## 2021-04-24 LAB — GLUCOSE, CAPILLARY
Glucose-Capillary: 155 mg/dL — ABNORMAL HIGH (ref 70–99)
Glucose-Capillary: 159 mg/dL — ABNORMAL HIGH (ref 70–99)

## 2021-04-24 LAB — TYPE AND SCREEN
ABO/RH(D): O POS
Antibody Screen: NEGATIVE

## 2021-04-24 LAB — CBG MONITORING, ED: Glucose-Capillary: 147 mg/dL — ABNORMAL HIGH (ref 70–99)

## 2021-04-24 LAB — HEMOGLOBIN A1C
Hgb A1c MFr Bld: 5.2 % (ref 4.8–5.6)
Mean Plasma Glucose: 102.54 mg/dL

## 2021-04-24 LAB — PROTIME-INR
INR: 1.1 (ref 0.8–1.2)
Prothrombin Time: 14.1 seconds (ref 11.4–15.2)

## 2021-04-24 LAB — ABO/RH: ABO/RH(D): O POS

## 2021-04-24 SURGERY — VIDEO BRONCHOSCOPY WITH ENDOBRONCHIAL NAVIGATION
Anesthesia: General

## 2021-04-24 MED ORDER — ALBUTEROL SULFATE (2.5 MG/3ML) 0.083% IN NEBU
INHALATION_SOLUTION | RESPIRATORY_TRACT | Status: AC
  Filled 2021-04-24: qty 3

## 2021-04-24 MED ORDER — ONDANSETRON HCL 4 MG/2ML IJ SOLN
INTRAMUSCULAR | Status: DC | PRN
  Administered 2021-04-24: 4 mg via INTRAVENOUS

## 2021-04-24 MED ORDER — ALBUTEROL SULFATE (2.5 MG/3ML) 0.083% IN NEBU
2.5000 mg | INHALATION_SOLUTION | Freq: Once | RESPIRATORY_TRACT | Status: AC
  Administered 2021-04-24: 2.5 mg via RESPIRATORY_TRACT

## 2021-04-24 MED ORDER — MEPERIDINE HCL 100 MG/ML IJ SOLN: 6.2500 mg | INTRAMUSCULAR | Status: DC | PRN

## 2021-04-24 MED ORDER — LACTATED RINGERS IV SOLN: INTRAVENOUS | Status: DC

## 2021-04-24 MED ORDER — INSULIN ASPART 100 UNIT/ML IJ SOLN
0.0000 [IU] | Freq: Every day | INTRAMUSCULAR | Status: DC
  Administered 2021-04-26: 2 [IU] via SUBCUTANEOUS

## 2021-04-24 MED ORDER — PHENYLEPHRINE HCL (PRESSORS) 10 MG/ML IV SOLN
INTRAVENOUS | Status: DC | PRN
  Administered 2021-04-24 (×2): 80 ug via INTRAVENOUS

## 2021-04-24 MED ORDER — INSULIN ASPART 100 UNIT/ML IJ SOLN
0.0000 [IU] | Freq: Three times a day (TID) | INTRAMUSCULAR | Status: DC
  Administered 2021-04-24 – 2021-04-25 (×2): 1 [IU] via SUBCUTANEOUS
  Administered 2021-04-25: 2 [IU] via SUBCUTANEOUS
  Administered 2021-04-25: 1 [IU] via SUBCUTANEOUS
  Administered 2021-04-26 (×2): 2 [IU] via SUBCUTANEOUS
  Administered 2021-04-26 – 2021-04-27 (×2): 1 [IU] via SUBCUTANEOUS
  Administered 2021-04-27: 3 [IU] via SUBCUTANEOUS
  Administered 2021-04-27: 2 [IU] via SUBCUTANEOUS
  Administered 2021-04-28: 1 [IU] via SUBCUTANEOUS
  Administered 2021-04-28: 2 [IU] via SUBCUTANEOUS
  Administered 2021-04-28 – 2021-04-29 (×3): 1 [IU] via SUBCUTANEOUS

## 2021-04-24 MED ORDER — CHLORHEXIDINE GLUCONATE 0.12 % MT SOLN
15.0000 mL | OROMUCOSAL | Status: AC
  Filled 2021-04-24: qty 15

## 2021-04-24 MED ORDER — MORPHINE SULFATE (PF) 2 MG/ML IV SOLN
1.0000 mg | INTRAVENOUS | Status: AC
  Administered 2021-04-24: 1 mg via INTRAVENOUS
  Filled 2021-04-24: qty 1

## 2021-04-24 MED ORDER — FENTANYL CITRATE (PF) 100 MCG/2ML IJ SOLN
INTRAMUSCULAR | Status: AC
  Filled 2021-04-24: qty 2

## 2021-04-24 MED ORDER — FENTANYL CITRATE (PF) 100 MCG/2ML IJ SOLN
25.0000 ug | INTRAMUSCULAR | Status: DC | PRN
  Administered 2021-04-24: 25 ug via INTRAVENOUS
  Administered 2021-04-24: 50 ug via INTRAVENOUS
  Administered 2021-04-24: 25 ug via INTRAVENOUS

## 2021-04-24 MED ORDER — PROPOFOL 10 MG/ML IV BOLUS
INTRAVENOUS | Status: DC | PRN
  Administered 2021-04-24: 160 mg via INTRAVENOUS

## 2021-04-24 MED ORDER — MORPHINE SULFATE (PF) 2 MG/ML IV SOLN
2.0000 mg | Freq: Once | INTRAVENOUS | Status: AC
  Administered 2021-04-24: 2 mg via INTRAVENOUS
  Filled 2021-04-24: qty 1

## 2021-04-24 MED ORDER — LIDOCAINE HCL (CARDIAC) PF 100 MG/5ML IV SOSY
PREFILLED_SYRINGE | INTRAVENOUS | Status: DC | PRN
  Administered 2021-04-24: 40 mg via INTRAVENOUS

## 2021-04-24 MED ORDER — MIDAZOLAM HCL 5 MG/5ML IJ SOLN
INTRAMUSCULAR | Status: DC | PRN
Start: 2021-04-24 — End: 2021-04-24
  Administered 2021-04-24: 2 mg via INTRAVENOUS

## 2021-04-24 MED ORDER — ROCURONIUM BROMIDE 100 MG/10ML IV SOLN
INTRAVENOUS | Status: DC | PRN
  Administered 2021-04-24: 60 mg via INTRAVENOUS
  Administered 2021-04-24: 20 mg via INTRAVENOUS

## 2021-04-24 MED ORDER — SODIUM CHLORIDE 0.9% FLUSH
10.0000 mL | Freq: Three times a day (TID) | INTRAVENOUS | Status: DC
  Administered 2021-04-25 – 2021-04-30 (×15): 10 mL

## 2021-04-24 MED ORDER — CHLORHEXIDINE GLUCONATE 0.12 % MT SOLN
OROMUCOSAL | Status: AC
  Administered 2021-04-24: 15 mL via OROMUCOSAL
  Filled 2021-04-24: qty 15

## 2021-04-24 MED ORDER — ALBUTEROL SULFATE (2.5 MG/3ML) 0.083% IN NEBU: 2.5000 mg | INHALATION_SOLUTION | Freq: Four times a day (QID) | RESPIRATORY_TRACT | Status: DC | PRN

## 2021-04-24 MED ORDER — LACTATED RINGERS IV SOLN: INTRAVENOUS | Status: DC | PRN

## 2021-04-24 MED ORDER — PROMETHAZINE HCL 25 MG/ML IJ SOLN: 6.2500 mg | INTRAMUSCULAR | Status: DC | PRN

## 2021-04-24 MED ORDER — FENTANYL CITRATE (PF) 100 MCG/2ML IJ SOLN
INTRAMUSCULAR | Status: DC | PRN
  Administered 2021-04-24 (×2): 50 ug via INTRAVENOUS

## 2021-04-24 MED ORDER — PHENYLEPHRINE HCL-NACL 20-0.9 MG/250ML-% IV SOLN
INTRAVENOUS | Status: DC | PRN
  Administered 2021-04-24: 15 ug/min via INTRAVENOUS

## 2021-04-24 SURGICAL SUPPLY — 46 items

## 2021-04-24 NOTE — Progress Notes (Signed)
New admission to room 2W16. Patient is alert and moaning with CO pain to back. Chest tube site to right upper chest, dry clean and intact. Suction set up at 20cm wall suction with 20cc drain level. Noe Gens, NP at bedside. Order for one time IV morphine received and administered. VS checked and HR 11, BP 138/86, resp- 21, 94% with [place] oxygen at 3L. Tele monitor applied and CCMD called. Bed kept in low position and locked. Call bell in reach. Will closely monitor.

## 2021-04-24 NOTE — Transfer of Care (Signed)
Immediate Anesthesia Transfer of Care Note  Patient: Abigail Golden  Procedure(s) Performed: VIDEO BRONCHOSCOPY WITH ENDOBRONCHIAL NAVIGATION VIDEO BRONCHOSCOPY WITH RADIAL ENDOBRONCHIAL ULTRASOUND BRONCHIAL NEEDLE ASPIRATION BIOPSIES BRONCHIAL BRUSHINGS BRONCHIAL BIOPSIES CHEST TUBE INSERTION  Patient Location: PACU  Anesthesia Type:General  Level of Consciousness: awake, patient cooperative and confused  Airway & Oxygen Therapy: Patient Spontanous Breathing and Patient connected to face mask oxygen  Post-op Assessment: Report given to RN and Post -op Vital signs reviewed and stable  Post vital signs: Reviewed and stable  Last Vitals:  Vitals Value Taken Time  BP 156/74 04/24/21 1654  Temp 37.8 C 04/24/21 1655  Pulse 107 04/24/21 1714  Resp 25 04/24/21 1714  SpO2 93 % 04/24/21 1714  Vitals shown include unvalidated device data.  Last Pain:  Vitals:   04/24/21 0645  PainSc: Asleep         Complications: Pneumothorax to right side treated intraoperatively.

## 2021-04-24 NOTE — Interval H&P Note (Signed)
History and Physical Interval Note:  04/24/2021 2:38 PM  Abigail Golden  has presented today for surgery, with the diagnosis of lung mass.  The various methods of treatment have been discussed with the patient and family. After consideration of risks, benefits and other options for treatment, the patient has consented to  Procedure(s): [place] (N/A) as a surgical intervention.  The patient's history has been reviewed, patient examined, no change in status, stable for surgery.  I have reviewed the patient's chart and labs.  Questions were answered to the patient's satisfaction.     [place]

## 2021-04-24 NOTE — ED Notes (Signed)
Breakfast Orders placed 

## 2021-04-24 NOTE — Evaluation (Addendum)
Physical Therapy Evaluation and Discharge Patient Details Name: Abigail Golden MRN: [id number] DOB: 21-Oct-1965 Today's Date: 04/24/2021  History of Present Illness  Pt is a 55 y/o female admitted 11/1 secondary to headache and dizziness.Imaging revealed L parietal and frontal mass. Also found to have mass in L upper lobe of lung. Workup pending. PMH includes migraines.  Clinical Impression  Pt admitted secondary to problem above with deficits below. Pt with slow guarded gait, but asymptomatic throughout. Pt reports she feels close to baseline. Educated about using cane when dizziness flairs for increased safety and support. Would also benefit from outpatient PT if symptoms worsen. Will sign off at this time as pt reports she is close to baseline, but if symptoms worsen, please re-consult.        Recommendations for follow up therapy are one component of a multi-disciplinary discharge planning process, led by the attending physician.  Recommendations may be updated based on patient status, additional functional criteria and insurance authorization.  Follow Up Recommendations Other (comment) (Would benefit from outpatient PT if symptoms worsen)    Assistance Recommended at Discharge Intermittent Supervision/Assistance  Functional Status Assessment Patient has had a recent decline in their functional status and demonstrates the ability to make significant improvements in function in a reasonable and predictable amount of time.  Equipment Recommendations  None recommended by PT    Recommendations for Other Services       Precautions / Restrictions Precautions Precautions: None Restrictions Weight Bearing Restrictions: No      Mobility  Bed Mobility Overal bed mobility: Needs Assistance Bed Mobility: Supine to Sit;Sit to Supine     Supine to sit: Min assist Sit to supine: Modified independent (Device/Increase time)   General bed mobility comments: Min A for trunk elevation to come  to sitting on stretcher. Pt sat at EOB for prolonged period to ensure no dizziness.    Transfers Overall transfer level: Needs assistance Equipment used: None Transfers: Sit to/from Stand Sit to Stand: Supervision           General transfer comment: Supervison for safety. Pt asymptomatic throughout.    Ambulation/Gait Ambulation/Gait assistance: Supervision Gait Distance (Feet): 30 Feet Assistive device: None Gait Pattern/deviations: Step-through pattern;Decreased stride length Gait velocity: Decreased   General Gait Details: Very slow, cautious gait. Asymptomatic throughout. No overt LOB noted. Educated about safety when turning and with head turns given hx of dizziness. Educated about walking program at home.  Stairs            Wheelchair Mobility    Modified Rankin (Stroke Patients Only)       Balance Overall balance assessment: Mild deficits observed, not formally tested                                           Pertinent Vitals/Pain Pain Assessment: Faces Faces Pain Scale: Hurts a little bit Pain Location: headache Pain Descriptors / Indicators: Headache Pain Intervention(s): Limited activity within patient's tolerance;Monitored during session;Repositioned    Home Living Family/patient expects to be discharged to:: Private residence Living Arrangements: Alone Available Help at Discharge: Friend(s) Type of Home: House Home Access: Stairs to enter Entrance Stairs-Rails: Right Entrance Stairs-Number of Steps: 2   Home Layout: One level Home Equipment: None      Prior Function Prior Level of Function : Independent/Modified Independent;Working/employed  Mobility Comments: Independent; works as a Engineering geologist        Extremity/Trunk Assessment   Upper Extremity Assessment Upper Extremity Assessment: Defer to OT evaluation    Lower Extremity Assessment Lower Extremity Assessment:  Overall WFL for tasks assessed    Cervical / Trunk Assessment Cervical / Trunk Assessment: Normal  Communication   Communication: No difficulties  Cognition Arousal/Alertness: Awake/alert Behavior During Therapy: WFL for tasks assessed/performed Overall Cognitive Status: No family/caregiver present to determine baseline cognitive functioning                                 General Comments: Mildly slower processing noted.        General Comments      Exercises     Assessment/Plan    PT Assessment Patient does not need any further PT services  PT Problem List         PT Treatment Interventions      PT Goals (Current goals can be found in the Care Plan section)  Acute Rehab PT Goals Patient Stated Goal: to go home PT Goal Formulation: With patient Time For Goal Achievement: 04/24/21 Potential to Achieve Goals: Good    Frequency     Barriers to discharge        Co-evaluation               AM-PAC PT "6 Clicks" Mobility  Outcome Measure Help needed turning from your back to your side while in a flat bed without using bedrails?: None Help needed moving from lying on your back to sitting on the side of a flat bed without using bedrails?: A Little Help needed moving to and from a bed to a chair (including a wheelchair)?: None Help needed standing up from a chair using your arms (e.g., wheelchair or bedside chair)?: None Help needed to walk in hospital room?: None Help needed climbing 3-5 steps with a railing? : A Little 6 Click Score: 22    End of Session   Activity Tolerance: Patient tolerated treatment well Patient left: in bed;with call bell/phone within reach (on stretcher in ED) Nurse Communication: Mobility status PT Visit Diagnosis: Other symptoms and signs involving the nervous system (U76.546)    Time: 5035-4656 PT Time Calculation (min) (ACUTE ONLY): 15 min   Charges:   PT Evaluation $PT Eval Moderate Complexity: 1 Mod           Lou Miner, DPT  Acute Rehabilitation Services  Pager: [phone] Office: [phone]   Rudean Hitt 04/24/2021, 12:37 PM

## 2021-04-24 NOTE — ED Notes (Signed)
Pt walked to the bathroom, independently.

## 2021-04-24 NOTE — H&P (View-Only) (Signed)
NAME:  Abigail Golden, MRN:  [id number], DOB:  [birth year], LOS: 0 ADMISSION DATE:  04/23/2021, CONSULTATION DATE:  04/24/21 REFERRING MD:  Candiss Norse, CHIEF COMPLAINT:  headaches   History of Present Illness:  55 year old never smoker w/ hx of seasonal allergies, allergic bronchitis, childhood migraines presenting with 3-4 weeks of recurrent dizziness episodes associated with neuralgia-type pain across back of head.  Workup has revealed two brain metastases as well as a large LUL mass for which PCCM consulted.  No family hx of early cancers, no unusual exposures. Has hx of hysterectomy for fibroids.  Denies SOB, hemoptysis.  Has postnasal drip and dry cough which is chronic and associated with her seasonal allergies.  She has no focal weakness, trouble speaking, shaking or seizure-like events.  There is a question of if she is losing consciousness with these dizzy spells.  Dizzy spells are worse with exertion.  Works for school system.  Pertinent  Medical History   Past Medical History:  Diagnosis Date   Allergy    Anxiety    Chronic bronchitis ([place])    Depression    Headache    Hypoglycemia    Lower back pain    since fall ~ 2012 (05/25/2015)   Migraine    05/25/2015 "probably once/month"   Migraine headache    Pain in left hip    since fall ~ 2012 (05/25/2015)   Pneumonia "several times"     Significant Hospital Events: Including procedures, antibiotic start and stop dates in addition to other pertinent events   11/1-2 admitted  Interim History / Subjective:  Consulted  Objective   Blood pressure 128/65, pulse 72, temperature 98.9 F (37.2 C), resp. rate 14, SpO2 97 %.       No intake or output data in the 24 hours ending 04/24/21 0747 There were no vitals filed for this visit.  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  Nonsmoker raises question of eGFR positivity.  Dr. Valeta Harms will fit in procedure today.  Will need close heme/onc and med/onc f/u. Presyncope r/o complex seizures - DC lovenox - Continue dexamethasone - Bronch today with Icard, does okay with anesthesia just "takes a while to wake up" - Check EEG, if epileptogenic foci would start on keppra - PT/OT - Will follow with you  Best Practice (right click and "Reselect all SmartList Selections" daily)   Per primary  Labs   CBC: Recent Labs  Lab 04/22/21 2042 04/24/21 0520  WBC 5.5 4.6  NEUTROABS 2.2 3.3  HGB 12.7 13.0  HCT 38.5 37.8  MCV 87.5 86.9  PLT 236 370    Basic Metabolic Panel: Recent Labs  Lab 04/22/21 2042 04/24/21 0520  NA 137 133*  K 3.7 4.3  CL 104 104  CO2 26 18*  GLUCOSE 104* 166*  BUN 11 10  CREATININE 0.94 0.77  CALCIUM 9.0 8.9   GFR: CrCl cannot be calculated (Unknown ideal weight.). Recent Labs  Lab 04/22/21 2042  04/24/21 0520  WBC 5.5 4.6    Liver Function Tests: Recent Labs  Lab 04/22/21 2042 04/24/21 0520  AST 16 17  ALT 13 15  ALKPHOS 55 56  BILITOT 0.7 0.7  PROT 6.9 6.9  ALBUMIN 3.6 3.5   No results for input(s): LIPASE, AMYLASE in the last 168 hours. No results for input(s): AMMONIA in the last 168 hours.  ABG No results found for: PHART, PCO2ART, PO2ART, HCO3, TCO2, ACIDBASEDEF, O2SAT   Coagulation Profile: No results for input(s): INR, PROTIME in the last 168 hours.  Cardiac Enzymes: No results for input(s): CKTOTAL, CKMB, CKMBINDEX, TROPONINI in the last 168  hours.  HbA1C: No results found for: HGBA1C  CBG: No results for input(s): GLUCAP in the last 168 hours.  Review of Systems:    Positive Symptoms in bold:  Constitutional fevers, chills, weight loss, fatigue, anorexia, malaise  Eyes decreased vision, double vision, eye irritation  Ears, Nose, Mouth, Throat sore throat, trouble swallowing, sinus congestion  Cardiovascular chest pain, paroxysmal nocturnal dyspnea, lower ext edema, palpitations   Respiratory SOB, cough, DOE, hemoptysis, wheezing  Gastrointestinal nausea, vomiting, diarrhea  Genitourinary burning with urination, trouble urinating  Musculoskeletal joint aches, joint swelling, back pain  Integumentary  rashes, skin lesions  Neurological focal weakness, focal numbness, trouble speaking, headaches  Psychiatric depression, anxiety, confusion  Endocrine polyuria, polydipsia, cold intolerance, heat intolerance  Hematologic abnormal bruising, abnormal bleeding, unexplained nose bleeds  Allergic/Immunologic recurrent infections, hives, swollen lymph nodes     Past Medical History:  She,  has a past medical history of Allergy, Anxiety, Chronic bronchitis ([place]), Depression, Headache, Hypoglycemia, Lower back pain, Migraine, Migraine headache, Pain in left hip, and Pneumonia ("several times").   Surgical History:   Past Surgical History:  Procedure Laterality Date   ABDOMINAL HYSTERECTOMY  11/2002   "found benign tumors"   CHOLECYSTECTOMY N/A 05/25/2015   Procedure: LAPAROSCOPIC CHOLECYSTECTOMY;  Surgeon: Ralene Ok, MD;  Location: [facility];  Service: General;  Laterality: N/A;   LAPAROSCOPIC CHOLECYSTECTOMY  05/25/2015   MENISCUS REPAIR Right 03/2017     Social History:   reports that she has never smoked. She has never used smokeless tobacco. She reports that she does not drink alcohol and does not use drugs.   Family History:  Her family history includes Diabetes in her paternal grandmother; Heart disease in her  mother; Yves Dill Parkinson White syndrome in her father and paternal grandmother. There is no history of Breast cancer.   Allergies Allergies  Allergen Reactions   Celebrex [Celecoxib] Rash    On face itching   Minocycline Rash    On face Itching     Home Medications  Prior to Admission medications   Medication Sig Start Date End Date Taking? Authorizing Provider  acetaminophen (TYLENOL) 500 MG tablet Take 500 mg by mouth every 6 (six) hours as needed for moderate pain or headache.   Yes [provider]  albuterol (VENTOLIN HFA) 108 (90 Base) MCG/ACT inhaler Inhale 2 puffs into the lungs every 4 (four) hours as needed. 06/21/19  Yes [provider]  ALPRAZolam (XANAX) 0.25 MG tablet Take 0.25 mg by mouth every 4 (four) hours as needed for anxiety. for anxiety 07/16/18  Yes [provider]  aspirin EC 81 MG tablet Take 81 mg by mouth daily. Swallow whole.   Yes [provider]  citalopram (CELEXA) 10 MG tablet Take 10 mg by mouth at bedtime. 08/16/18  Yes [provider]  Diclofenac Potassium,Migraine, (CAMBIA) 50 MG  PACK Take 1 packet by mouth 2 (two) times daily as needed (migraine).   Yes [provider]  diclofenac sodium (VOLTAREN) 1 % GEL Apply topically as needed. To affected area   Yes [provider]  diphenhydrAMINE (BENADRYL) 25 MG tablet Take 25 mg by mouth See admin instructions. 25 mg at bedtime 25 mg as needed for itching   Yes [provider]  eletriptan (RELPAX) 20 MG tablet Take 20 mg by mouth as needed for headache (at onset of migraine.). May repeat in 2 hours if needed.   Yes [provider]  estradiol (ESTRACE) 2 MG tablet Take 2 mg by mouth at bedtime.    Yes [provider]  fluticasone (FLONASE) 50 MCG/ACT nasal spray Place 2 sprays into both nostrils daily as needed for allergies. 02/12/17  Yes [provider]  hydrOXYzine (ATARAX/VISTARIL) 25 MG tablet Take 25 mg by mouth  See admin instructions. 25 mg at bedtime 25 mg during the day as needed for itching/anxiety   Yes [provider]  loratadine (CLARITIN) 10 MG tablet Take 10 mg by mouth daily.   Yes [provider]  montelukast (SINGULAIR) 10 MG tablet Take 10 mg by mouth at bedtime.   Yes [provider]  ondansetron (ZOFRAN-ODT) 4 MG disintegrating tablet Take 4 mg by mouth every 8 (eight) hours as needed for nausea or vomiting.   Yes [provider]  solifenacin (VESICARE) 10 MG tablet Take 10 mg by mouth every evening.   Yes [provider]  HYDROcodone-acetaminophen (NORCO/VICODIN) 5-325 MG tablet Take 1 tablet by mouth every 4 (four) hours as needed. Patient not taking: Reported on 04/23/2021 02/26/17   Barnet Glasgow, NP

## 2021-04-24 NOTE — Progress Notes (Signed)
CXR reviewed with Dr. Valeta Harms - concern that PTX not resolved. Assessed chest tube at bedside.  No air leak noted initially.  Suction temporarily increased to 30 cm with increase in air leak / pleural tidal.  Chest tube pulled back 3cm. Patient indicates pain in back. Sweating, writhing in bed / uncomfortable appearing.  Family - Kayla [phone]) at bedside.  Orders placed for 1mg  IV morphine for pain.  Discussed plan of care with RN.  STAT CXR ordered for follow up.      Noe Gens, MSN, APRN, NP-C, AGACNP-BC [facility] 04/24/2021, 6:47 PM   Please see [email or web address] for pager details.   From 7A-7P if no response, please call [phone] After hours, please call ELink [phone]

## 2021-04-24 NOTE — Anesthesia Procedure Notes (Addendum)
Procedure Name: Intubation Date/Time: 04/24/2021 3:02 PM Performed by: Oletta Lamas, CRNA Pre-anesthesia Checklist: Patient identified, Emergency Drugs available, Suction available and Patient being monitored Patient Re-evaluated:Patient Re-evaluated prior to induction Oxygen Delivery Method: Circle System Utilized Preoxygenation: Pre-oxygenation with 100% oxygen Tube type: Oral Tube size: 8.0 mm Number of attempts: 1 Airway Equipment and Method: Bougie stylet Placement Confirmation: ETT inserted through vocal cords under direct vision, positive ETCO2 and breath sounds checked- equal and bilateral Secured at: 20 cm Tube secured with: Tape Dental Injury: Teeth and Oropharynx as per pre-operative assessment  Comments: Lubricated bougie stylet used to switch from 7.5 to 8.0 tube.  Stylet and new ETT passed with ease.   Tube secured 23 at the lip. Bilateral breath sounds per MDA +ETC02.  Dr. Valeta Harms inserted bronch and identified rt mainstem intubation @ 1503.  Cuff deflated and pulled back to 20cm at the lip and secured.

## 2021-04-24 NOTE — Op Note (Signed)
Video Bronchoscopy with Robotic Assisted Bronchoscopic Navigation   Date of Operation: 04/24/2021   Pre-op Diagnosis: Left upper lobe lung mass  Post-op Diagnosis: Left upper lobe lung mass  Surgeon: Garner Nash, DO   Assistants: None   Anesthesia: General endotracheal anesthesia  Operation: Flexible video fiberoptic bronchoscopy with robotic assistance and biopsies.  Estimated Blood Loss: Minimal  Complications: None  Indications and History: Abigail Golden is a 55 y.o. female with history of LUL lung mass. The risks, benefits, complications, treatment options and expected outcomes were discussed with the patient.  The possibilities of pneumothorax, pneumonia, reaction to medication, pulmonary aspiration, perforation of a viscus, bleeding, failure to diagnose a condition and creating a complication requiring transfusion or operation were discussed with the patient who freely signed the consent.    Description of Procedure: The patient was seen in the Preoperative Area, was examined and was deemed appropriate to proceed.  The patient was taken to [facility] endoscopy room 3, identified as [name] and the procedure verified as Flexible Video Fiberoptic Bronchoscopy.  A Time Out was held and the above information confirmed.   Prior to the date of the procedure a high-resolution CT scan of the chest was performed. Utilizing ION software program a virtual tracheobronchial tree was generated to allow the creation of distinct navigation pathways to the patient's parenchymal abnormalities. After being taken to the operating room general anesthesia was initiated and the patient  was orally intubated. The video fiberoptic bronchoscope was introduced via the endotracheal tube and a general inspection was performed which showed normal right and left lung anatomy, aspiration of the bilateral mainstems was completed to remove any remaining secretions. Robotic catheter inserted into patient's  endotracheal tube.   Target #1 LUL lung mass: The distinct navigation pathways prepared prior to this procedure were then utilized to navigate to patient's lesion identified on CT scan. The robotic catheter was secured into place and the vision probe was withdrawn.  Lesion location was approximated using fluoroscopy and radial endobronchial ultrasound for peripheral targeting.  Using the three-dimensional mobile CBCT imaging we confirmed the location of the lesion with total and lesion. under fluoroscopic guidance transbronchial needle brushings, transbronchial needle biopsies, and transbronchial forceps biopsies were performed to be sent for cytology and pathology.   Of note, from the beginning of the case patient had decreased volume returns on the ventilator and higher peak pressures that slowly progressed throughout the case.  At the initiation of the case she was initially intubated with a size 7 ET tube.  Unfortunately due to the small caliber of the tube this had to be exchanged.  This tube was upsized and the case was started normally.  Ultimately towards the end of the case she continued to have low volume returns and higher peak pressures.  The fluoroscopy unit was moved to examine the right chest and confirmation of a right-sided pneumothorax was made.  Decision was made for intraoperative pigtail catheter placement.  This was confirmed with fluoroscopy.  At the end of the procedure a general airway inspection was performed and there was no evidence of active bleeding. The bronchoscope was removed.  The patient tolerated the procedure well. There was no significant blood loss and there were no obvious complications. A post-procedural chest x-ray is pending.  Samples Target #1: 1. Transbronchial needle brushings from LUL  2. Transbronchial Wang needle biopsies from LUL 3. Transbronchial forceps biopsies from LUL  Plans:  The patient will be discharged from the PACU  to home when recovered  from anesthesia and after chest x-ray is reviewed. We will review the cytology, pathology and microbiology results with the patient when they become available. Outpatient followup will be with Garner Nash, DO   Garner Nash, DO [facility] 04/24/2021 4:50 PM

## 2021-04-24 NOTE — Consult Note (Signed)
NAME:  Abigail Golden, MRN:  [id number], DOB:  12-28-1965, LOS: 0 ADMISSION DATE:  04/23/2021, CONSULTATION DATE:  04/24/21 REFERRING MD:  Candiss Norse, CHIEF COMPLAINT:  headaches   History of Present Illness:  55 year old never smoker w/ hx of seasonal allergies, allergic bronchitis, childhood migraines presenting with 3-4 weeks of recurrent dizziness episodes associated with neuralgia-type pain across back of head.  Workup has revealed two brain metastases as well as a large LUL mass for which PCCM consulted.  No family hx of early cancers, no unusual exposures. Has hx of hysterectomy for fibroids.  Denies SOB, hemoptysis.  Has postnasal drip and dry cough which is chronic and associated with her seasonal allergies.  She has no focal weakness, trouble speaking, shaking or seizure-like events.  There is a question of if she is losing consciousness with these dizzy spells.  Dizzy spells are worse with exertion.  Works for school system.  Pertinent  Medical History   Past Medical History:  Diagnosis Date   Allergy    Anxiety    Chronic bronchitis ([place])    Depression    Headache    Hypoglycemia    Lower back pain    since fall ~ 2012 (05/25/2015)   Migraine    05/25/2015 "probably once/month"   Migraine headache    Pain in left hip    since fall ~ 2012 (05/25/2015)   Pneumonia "several times"     Significant Hospital Events: Including procedures, antibiotic start and stop dates in addition to other pertinent events   11/1-2 admitted  Interim History / Subjective:  Consulted  Objective   Blood pressure 128/65, pulse 72, temperature 98.9 F (37.2 C), resp. rate 14, SpO2 97 %.       No intake or output data in the 24 hours ending 04/24/21 0747 There were no vitals filed for this visit.  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  Nonsmoker raises question of eGFR positivity.  Dr. Valeta Harms will fit in procedure today.  Will need close heme/onc and med/onc f/u. Presyncope r/o complex seizures - DC lovenox - Continue dexamethasone - Bronch today with Icard, does okay with anesthesia just "takes a while to wake up" - Check EEG, if epileptogenic foci would start on keppra - PT/OT - Will follow with you  Best Practice (right click and "Reselect all SmartList Selections" daily)   Per primary  Labs   CBC: Recent Labs  Lab 04/22/21 2042 04/24/21 0520  WBC 5.5 4.6  NEUTROABS 2.2 3.3  HGB 12.7 13.0  HCT 38.5 37.8  MCV 87.5 86.9  PLT 236 242    Basic Metabolic Panel: Recent Labs  Lab 04/22/21 2042 04/24/21 0520  NA 137 133*  K 3.7 4.3  CL 104 104  CO2 26 18*  GLUCOSE 104* 166*  BUN 11 10  CREATININE 0.94 0.77  CALCIUM 9.0 8.9   GFR: CrCl cannot be calculated (Unknown ideal weight.). Recent Labs  Lab 04/22/21 2042  04/24/21 0520  WBC 5.5 4.6    Liver Function Tests: Recent Labs  Lab 04/22/21 2042 04/24/21 0520  AST 16 17  ALT 13 15  ALKPHOS 55 56  BILITOT 0.7 0.7  PROT 6.9 6.9  ALBUMIN 3.6 3.5   No results for input(s): LIPASE, AMYLASE in the last 168 hours. No results for input(s): AMMONIA in the last 168 hours.  ABG No results found for: PHART, PCO2ART, PO2ART, HCO3, TCO2, ACIDBASEDEF, O2SAT   Coagulation Profile: No results for input(s): INR, PROTIME in the last 168 hours.  Cardiac Enzymes: No results for input(s): CKTOTAL, CKMB, CKMBINDEX, TROPONINI in the last 168  hours.  HbA1C: No results found for: HGBA1C  CBG: No results for input(s): GLUCAP in the last 168 hours.  Review of Systems:    Positive Symptoms in bold:  Constitutional fevers, chills, weight loss, fatigue, anorexia, malaise  Eyes decreased vision, double vision, eye irritation  Ears, Nose, Mouth, Throat sore throat, trouble swallowing, sinus congestion  Cardiovascular chest pain, paroxysmal nocturnal dyspnea, lower ext edema, palpitations   Respiratory SOB, cough, DOE, hemoptysis, wheezing  Gastrointestinal nausea, vomiting, diarrhea  Genitourinary burning with urination, trouble urinating  Musculoskeletal joint aches, joint swelling, back pain  Integumentary  rashes, skin lesions  Neurological focal weakness, focal numbness, trouble speaking, headaches  Psychiatric depression, anxiety, confusion  Endocrine polyuria, polydipsia, cold intolerance, heat intolerance  Hematologic abnormal bruising, abnormal bleeding, unexplained nose bleeds  Allergic/Immunologic recurrent infections, hives, swollen lymph nodes     Past Medical History:  She,  has a past medical history of Allergy, Anxiety, Chronic bronchitis ([facility]), Depression, Headache, Hypoglycemia, Lower back pain, Migraine, Migraine headache, Pain in left hip, and Pneumonia ("several times").   Surgical History:   Past Surgical History:  Procedure Laterality Date   ABDOMINAL HYSTERECTOMY  11/2002   "found benign tumors"   CHOLECYSTECTOMY N/A 05/25/2015   Procedure: LAPAROSCOPIC CHOLECYSTECTOMY;  Surgeon: Armando Ramirez, MD;  Location: [facility];  Service: General;  Laterality: N/A;   LAPAROSCOPIC CHOLECYSTECTOMY  05/25/2015   MENISCUS REPAIR Right 03/2017     Social History:   reports that she has never smoked. She has never used smokeless tobacco. She reports that she does not drink alcohol and does not use drugs.   Family History:  Her family history includes Diabetes in her paternal grandmother; Heart disease in her  mother; Wolff Parkinson White syndrome in her father and paternal grandmother. There is no history of Breast cancer.   Allergies Allergies  Allergen Reactions   Celebrex [Celecoxib] Rash    On face itching   Minocycline Rash    On face Itching     Home Medications  Prior to Admission medications   Medication Sig Start Date End Date Taking? Authorizing Provider  acetaminophen (TYLENOL) 500 MG tablet Take 500 mg by mouth every 6 (six) hours as needed for moderate pain or headache.   Yes [provider]  albuterol (VENTOLIN HFA) 108 (90 Base) MCG/ACT inhaler Inhale 2 puffs into the lungs every 4 (four) hours as needed. 06/21/19  Yes [provider]  ALPRAZolam (XANAX) 0.25 MG tablet Take 0.25 mg by mouth every 4 (four) hours as needed for anxiety. for anxiety 07/16/18  Yes [provider]  aspirin EC 81 MG tablet Take 81 mg by mouth daily. Swallow whole.   Yes [provider]  citalopram (CELEXA) 10 MG tablet Take 10 mg by mouth at bedtime. 08/16/18  Yes [provider]  Diclofenac Potassium,Migraine, (CAMBIA) 50 MG   PACK Take 1 packet by mouth 2 (two) times daily as needed (migraine).   Yes [provider]  diclofenac sodium (VOLTAREN) 1 % GEL Apply topically as needed. To affected area   Yes [provider]  diphenhydrAMINE (BENADRYL) 25 MG tablet Take 25 mg by mouth See admin instructions. 25 mg at bedtime 25 mg as needed for itching   Yes [provider]  eletriptan (RELPAX) 20 MG tablet Take 20 mg by mouth as needed for headache (at onset of migraine.). May repeat in 2 hours if needed.   Yes [provider]  estradiol (ESTRACE) 2 MG tablet Take 2 mg by mouth at bedtime.    Yes [provider]  fluticasone (FLONASE) 50 MCG/ACT nasal spray Place 2 sprays into both nostrils daily as needed for allergies. 02/12/17  Yes [provider]  hydrOXYzine (ATARAX/VISTARIL) 25 MG tablet Take 25 mg by mouth  See admin instructions. 25 mg at bedtime 25 mg during the day as needed for itching/anxiety   Yes [provider]  loratadine (CLARITIN) 10 MG tablet Take 10 mg by mouth daily.   Yes [provider]  montelukast (SINGULAIR) 10 MG tablet Take 10 mg by mouth at bedtime.   Yes [provider]  ondansetron (ZOFRAN-ODT) 4 MG disintegrating tablet Take 4 mg by mouth every 8 (eight) hours as needed for nausea or vomiting.   Yes [provider]  solifenacin (VESICARE) 10 MG tablet Take 10 mg by mouth every evening.   Yes [provider]  HYDROcodone-acetaminophen (NORCO/VICODIN) 5-325 MG tablet Take 1 tablet by mouth every 4 (four) hours as needed. Patient not taking: Reported on 04/23/2021 02/26/17   Barnet Glasgow, NP

## 2021-04-24 NOTE — Progress Notes (Signed)
PROGRESS NOTE                                                                                                                                                                                                             Patient Demographics:    Abigail Golden, is a 55 y.o. female, DOB - 18-Feb-1966, JQB:[id number]  Outpatient Primary MD for the patient is Aletha Halim., PA-C    LOS - 0  Admit date - 04/23/2021    No chief complaint on file.      Brief Narrative (HPI from H&P)  Abigail Golden is a 55 y.o. female with history of migraine and allergies presents to the ER after outpatient CT scan showing a mass, MRI brain here confirmed brain mass along further work-up also suggests primary lung cancer with mets to the brain.  She was admitted for further work-up.   Subjective:    [name] today has, +ve headache, No chest pain, No abdominal pain - No Nausea, No new weakness tingling or numbness, no SOB   Assessment  & Plan :      Persistent headache caused by brain mass and vasogenic edema work-up suspicious for primary metastatic lung cancer - she has been placed on IV steroids and supportive care for headache, I have talked to pulmonary team early this morning they will try and do bronchoscopy and biopsy of the lung mass.  Surgery has been consulted, will call oncology after tissue diagnosis.  2.  Morbid obesity with BMI of 50.  Follow with PCP.  3.  History of allergies and migraines.  Supportive care.      Condition - Extremely Guarded  Family Communication  :  friend bedside  Code Status :  Full  Consults  : PCCM called for Bronch and biopsy, neurosurgery called  PUD Prophylaxis :    Procedures  :     CT Chest - Abd and Pelvis - 4.5 cm spiculated left upper lobe mass most compatible with primary lung cancer. This abuts the medial pleural surface. A few small adjacent pre-vascular mediastinal lymph  nodes, none pathologically enlarged. Recommend cardiothoracic surgical and oncologic consultation. No acute findings or evidence of metastatic disease in the abdomen or pelvis  MRI Brain  - 1. Metastatic disease to the brain. Two enhancing brain masses with moderate associated vasogenic  edema: solid 16 mm left parietal lobe mass, and a larger 29 mm rim enhancing cystic or necrotic mass in the left inferior frontal gyrus. Two other small 2-3 mm metastases in the right inferior frontal gyrus, right posterior temporal lobe. And questionable two additional punctate metastases in both superior frontal gyri abutting the falx (series 18, image 46). 2. No significant intracranial mass effect. No other intracranial abnormality      Disposition Plan  :    Status is: Observation  DVT Prophylaxis  :    Place and maintain sequential compression device Start: 04/24/21 0755  Lab Results  Component Value Date   PLT 229 04/24/2021    Diet :  Diet Order             Diet NPO time specified Except for: Sips with Meds  Diet effective now                    Inpatient Medications  Scheduled Meds:  citalopram  10 mg Oral QHS   darifenacin  7.5 mg Oral QHS   dexamethasone (DECADRON) injection  4 mg Intravenous Q6H   diphenhydrAMINE  25 mg Oral QHS   hydrOXYzine  25 mg Oral QHS   loratadine  10 mg Oral Daily   montelukast  10 mg Oral QHS   Continuous Infusions: PRN Meds:.acetaminophen **OR** acetaminophen, ALPRAZolam, diphenhydrAMINE, hydrOXYzine  Antibiotics  :    Anti-infectives (From admission, onward)    None        Time Spent in minutes  30   Lala Lund M.D on 04/24/2021 at 8:29 AM  To page go to [email or web address]   [facility] -  Office  [phone]  See all Orders from today for further details    Objective:   Vitals:   04/23/21 2300 04/24/21 0226 04/24/21 0230 04/24/21 0518  BP: (!) 135/57 123/65  128/65  Pulse: 72 72 67 72  Resp: 14     Temp:       SpO2: 97% 94% 96% 97%    Wt Readings from Last 3 Encounters:  09/08/18 120.2 kg  02/27/17 122.5 kg  05/24/15 113 kg    No intake or output data in the 24 hours ending 04/24/21 0829   Physical Exam  Awake Alert, No new F.N deficits, Normal affect [place].AT,PERRAL Supple Neck, No JVD,   Symmetrical Chest wall movement, Good air movement bilaterally, CTAB RRR,No Gallops,Rubs or new Murmurs,  +ve B.Sounds, Abd Soft, No tenderness,   No Cyanosis, Clubbing or edema       Data Review:    CBC Recent Labs  Lab 04/22/21 2042 04/24/21 0520  WBC 5.5 4.6  HGB 12.7 13.0  HCT 38.5 37.8  PLT 236 229  MCV 87.5 86.9  MCH 28.9 29.9  MCHC 33.0 34.4  RDW 12.6 12.6  LYMPHSABS 2.8 1.2  MONOABS 0.5 0.1  EOSABS 0.0 0.0  BASOSABS 0.0 0.0    Recent Labs  Lab 04/22/21 2042 04/24/21 0520  NA 137 133*  K 3.7 4.3  CL 104 104  CO2 26 18*  GLUCOSE 104* 166*  BUN 11 10  CREATININE 0.94 0.77  CALCIUM 9.0 8.9  AST 16 17  ALT 13 15  ALKPHOS 55 56  BILITOT 0.7 0.7  ALBUMIN 3.6 3.5    ------------------------------------------------------------------------------------------------------------------ No results for input(s): CHOL, HDL, LDLCALC, TRIG, CHOLHDL, LDLDIRECT in the last 72 hours.  No results found for: HGBA1C ------------------------------------------------------------------------------------------------------------------ No results for input(s): TSH, T4TOTAL, T3FREE, THYROIDAB in the  last 72 hours.  Invalid input(s): FREET3  Cardiac Enzymes No results for input(s): CKMB, TROPONINI, MYOGLOBIN in the last 168 hours.  Invalid input(s): CK ------------------------------------------------------------------------------------------------------------------ No results found for: BNP   Radiology Reports CT HEAD WO CONTRAST (5MM)  Result Date: 04/22/2021 CLINICAL DATA:  Worsening headaches.  Dizziness and lightheaded. EXAM: CT HEAD WITHOUT CONTRAST TECHNIQUE: Contiguous  axial images were obtained from the base of the skull through the vertex without intravenous contrast. COMPARISON:  Brain MRI 07/15/2018 FINDINGS: Brain: No abnormality seen affecting the brainstem or cerebellum. There are 2 separate areas of vasogenic edema, 1 within the inferior left frontal region in the other at the left parietal vertex. There are presumed underlying masses in both of those areas, though they are not clearly depicted. No evidence of hemorrhage. No left-to-right shift. No abnormality seen in the right hemisphere. No extra-axial fluid collection. The differential diagnosis includes venous infarctions and cerebritis. MRI with contrast strongly recommended. Vascular: No abnormal vascular finding. Skull: Normal Sinuses/Orbits: Clear/normal Other: None IMPRESSION: Two separate areas of vasogenic edema within the left hemisphere, 1 within the inferior frontal region in the other at the left parietal vertex. Metastatic disease would be the most likely cause of this appearance. Other possibilities include venous infarctions and cerebritis. MRI with contrast recommended. Call report in progress. Electronically Signed   By: Nelson Chimes M.D.   On: 04/22/2021 16:07   CT Chest W Contrast  Result Date: 04/23/2021 CLINICAL DATA:  Metastatic disease evaluation. Brain metastases on MRI. EXAM: CT CHEST, ABDOMEN, AND PELVIS WITH CONTRAST TECHNIQUE: Multidetector CT imaging of the chest, abdomen and pelvis was performed following the standard protocol during bolus administration of intravenous contrast. CONTRAST:  66mL OMNIPAQUE IOHEXOL 300 MG/ML  SOLN COMPARISON:  05/25/2015 FINDINGS: CT CHEST FINDINGS Cardiovascular: Heart is normal size. Aorta is normal caliber. Mediastinum/Nodes: No mediastinal, hilar, or axillary adenopathy. Trachea and esophagus are unremarkable. Thyroid unremarkable. Lungs/Pleura: 4.5 cm left upper lobe spiculated mass compatible with primary lung cancer. No pleural effusions. This abuts  the medial pleural surface and mediastinum. Few small adjacent pre-vascular lymph nodes, not pathologically enlarged based on CT size criteria. Musculoskeletal: Chest wall soft tissues are unremarkable. No acute bony abnormality. CT ABDOMEN PELVIS FINDINGS Hepatobiliary: Insert paddle biliary Pancreas: No focal abnormality or ductal dilatation. Spleen: No focal abnormality.  Normal size. Adrenals/Urinary Tract: No adrenal abnormality. No focal renal abnormality. No stones or hydronephrosis. Urinary bladder is unremarkable. Stomach/Bowel: Stomach, large and small bowel grossly unremarkable. Vascular/Lymphatic: No evidence of aneurysm or adenopathy. Reproductive: Prior hysterectomy.  No adnexal masses. Other: No free fluid or free air. Musculoskeletal: No acute bony abnormality. IMPRESSION: 4.5 cm spiculated left upper lobe mass most compatible with primary lung cancer. This abuts the medial pleural surface. A few small adjacent pre-vascular mediastinal lymph nodes, none pathologically enlarged. Recommend cardiothoracic surgical and oncologic consultation. No acute findings or evidence of metastatic disease in the abdomen or pelvis. Electronically Signed   By: Rolm Baptise M.D.   On: 04/23/2021 22:38   MR Brain W and Wo Contrast  Result Date: 04/23/2021 CLINICAL DATA:  55 year old female with increasing headaches. Multifocal vasogenic edema in the brain on plain head CT yesterday. EXAM: MRI HEAD WITHOUT AND WITH CONTRAST TECHNIQUE: Multiplanar, multiecho pulse sequences of the brain and surrounding structures were obtained without and with intravenous contrast. CONTRAST:  39mL GADAVIST GADOBUTROL 1 MMOL/ML IV SOLN COMPARISON:  Head CT 04/22/2021. Brain MRI without contrast 07/15/2018. FINDINGS: Brain: Solid enhancing 16 mm mass in the posterosuperior  left parietal lobe with moderate regional vasogenic edema (series 18, image 41 and series 20, image 18). Only mild regional mass effect. Large up to 29 mm rim  enhancing cystic or necrotic mass in the left hemisphere at the inferior frontal gyrus junction with the temporal lobe (series 18, image 22 and series 19, image 21. Moderate vasogenic edema in the region but only mild regional mass effect - including on the anterior left temporal tip. Punctate 2-3 mm abnormal enhancement in the posterior right temporal lobe seen on both series 20, image 6 and series 19, image 14. Subtle similar right inferior frontal gyrus lesion best seen on series 18, image 24 and series 20, image 13. No associated edema. And suspicion of 2 similar small nodular areas of enhancement in the superior frontal gyri abutting the midline. See series 18, image 46 and series 19, images 19 and 17. Less likely these could be dural based. No edema. No dural thickening or enhancement elsewhere. No superimposed restricted diffusion to suggest acute infarction. No midline shift, ventriculomegaly, extra-axial collection or acute intracranial hemorrhage. Cervicomedullary junction and pituitary are within normal limits. Normal background gray and white matter signal. No cortical encephalomalacia or chronic cerebral blood products. Vascular: Major intracranial vascular flow voids in the are stable since 2020. Dominant left vertebral artery. The major dural venous sinuses are enhancing and appear to be patent. Skull and upper cervical spine: Negative. Visualized bone marrow signal is within normal limits. Sinuses/Orbits: Negative. Other: Trace right mastoid fluid. Negative nasopharynx. Negative visible scalp and face. IMPRESSION: 1. Metastatic disease to the brain. Two enhancing brain masses with moderate associated vasogenic edema: solid 16 mm left parietal lobe mass, and a larger 29 mm rim enhancing cystic or necrotic mass in the left inferior frontal gyrus. Two other small 2-3 mm metastases in the right inferior frontal gyrus, right posterior temporal lobe. And questionable two additional punctate metastases in  both superior frontal gyri abutting the falx (series 18, image 46). 2. No significant intracranial mass effect. No other intracranial abnormality. Electronically Signed   By: Genevie Ann M.D.   On: 04/23/2021 06:41   CT Abdomen Pelvis W Contrast  Result Date: 04/23/2021 CLINICAL DATA:  Metastatic disease evaluation. Brain metastases on MRI. EXAM: CT CHEST, ABDOMEN, AND PELVIS WITH CONTRAST TECHNIQUE: Multidetector CT imaging of the chest, abdomen and pelvis was performed following the standard protocol during bolus administration of intravenous contrast. CONTRAST:  76mL OMNIPAQUE IOHEXOL 300 MG/ML  SOLN COMPARISON:  05/25/2015 FINDINGS: CT CHEST FINDINGS Cardiovascular: Heart is normal size. Aorta is normal caliber. Mediastinum/Nodes: No mediastinal, hilar, or axillary adenopathy. Trachea and esophagus are unremarkable. Thyroid unremarkable. Lungs/Pleura: 4.5 cm left upper lobe spiculated mass compatible with primary lung cancer. No pleural effusions. This abuts the medial pleural surface and mediastinum. Few small adjacent pre-vascular lymph nodes, not pathologically enlarged based on CT size criteria. Musculoskeletal: Chest wall soft tissues are unremarkable. No acute bony abnormality. CT ABDOMEN PELVIS FINDINGS Hepatobiliary: Insert paddle biliary Pancreas: No focal abnormality or ductal dilatation. Spleen: No focal abnormality.  Normal size. Adrenals/Urinary Tract: No adrenal abnormality. No focal renal abnormality. No stones or hydronephrosis. Urinary bladder is unremarkable. Stomach/Bowel: Stomach, large and small bowel grossly unremarkable. Vascular/Lymphatic: No evidence of aneurysm or adenopathy. Reproductive: Prior hysterectomy.  No adnexal masses. Other: No free fluid or free air. Musculoskeletal: No acute bony abnormality. IMPRESSION: 4.5 cm spiculated left upper lobe mass most compatible with primary lung cancer. This abuts the medial pleural surface. A few small adjacent pre-vascular  mediastinal lymph  nodes, none pathologically enlarged. Recommend cardiothoracic surgical and oncologic consultation. No acute findings or evidence of metastatic disease in the abdomen or pelvis. Electronically Signed   By: Rolm Baptise M.D.   On: 04/23/2021 22:38

## 2021-04-24 NOTE — Anesthesia Preprocedure Evaluation (Addendum)
Anesthesia Evaluation  Patient identified by MRN, date of birth, ID band Patient awake    Reviewed: Allergy & Precautions, NPO status , Patient's Chart, lab work & pertinent test results  Airway Mallampati: III  TM Distance: <3 FB Neck ROM: Full  Mouth opening: Limited Mouth Opening  Dental no notable dental hx. (+) Dental Advisory Given, Teeth Intact   Pulmonary neg pulmonary ROS,    Pulmonary exam normal breath sounds clear to auscultation       Cardiovascular negative cardio ROS Normal cardiovascular exam Rhythm:Regular Rate:Normal     Neuro/Psych negative neurological ROS  negative psych ROS   GI/Hepatic negative GI ROS, Neg liver ROS,   Endo/Other  Morbid obesity  Renal/GU negative Renal ROS  negative genitourinary   Musculoskeletal negative musculoskeletal ROS (+)   Abdominal (+) + obese,   Peds negative pediatric ROS (+)  Hematology negative hematology ROS (+)   Anesthesia Other Findings HPI: Abigail Golden is a 55 y.o. female with history of migraine and allergies presents to the ER after outpatient CT scan showed intracranial mass concerning for neoplasm and was referred by patient's primary care physician.   Reproductive/Obstetrics negative OB ROS                            Anesthesia Physical  Anesthesia Plan  ASA: 3  Anesthesia Plan: General   Post-op Pain Management:    Induction: Intravenous  PONV Risk Score and Plan: 3 and Ondansetron  Airway Management Planned: Oral ETT  Additional Equipment: None  Intra-op Plan:   Post-operative Plan: Extubation in OR  Informed Consent: I have reviewed the patients History and Physical, chart, labs and discussed the procedure including the risks, benefits and alternatives for the proposed anesthesia with the patient or authorized representative who has indicated his/her understanding and acceptance.     Dental advisory  given  Plan Discussed with: CRNA and Anesthesiologist  Anesthesia Plan Comments:         Anesthesia Quick Evaluation

## 2021-04-24 NOTE — Progress Notes (Signed)
EEG complete - results pending 

## 2021-04-24 NOTE — ED Notes (Signed)
Pt tx for bronchoscopy.

## 2021-04-24 NOTE — Procedures (Signed)
Routine EEG Report  Abigail Golden is a 55 y.o. female with a history of presyncope who is undergoing an EEG to evaluate for seizures.  Report: This EEG was acquired with electrodes placed according to the International 10-20 electrode system (including Fp1, Fp2, F3, F4, C3, C4, P3, P4, O1, O2, T3, T4, T5, T6, A1, A2, Fz, Cz, Pz). The following electrodes were missing or displaced: none.  The occipital dominant rhythm was 9 Hz. This activity is reactive to stimulation. Drowsiness was manifested by background fragmentation; deeper stages of sleep were not identified. There was intermittent focal slowing over the left posterior region. There were no interictal epileptiform discharges. There were no electrographic seizures identified. Photic stimulation and hyperventilation were not performed.  Impression and clinical correlation: This EEG was obtained while awake and drowsy and is abnormal due to intermittent focal slowing over the left posterior region, indicative of focal cerebral dysfunction in that area.  There were no electrographic seizures observed during this recording.  Su Monks, MD [facility] [phone]  If 7pm- 7am, please page neurology on call as listed in [place].

## 2021-04-24 NOTE — Anesthesia Procedure Notes (Addendum)
Procedure Name: Intubation Date/Time: 04/24/2021 2:51 PM Performed by: Oletta Lamas, CRNA Pre-anesthesia Checklist: Patient identified, Emergency Drugs available, Suction available and Patient being monitored Patient Re-evaluated:Patient Re-evaluated prior to induction Oxygen Delivery Method: Circle System Utilized Preoxygenation: Pre-oxygenation with 100% oxygen Induction Type: IV induction Ventilation: Mask ventilation without difficulty Laryngoscope Size: Glidescope and 3 Tube type: Oral Tube size: 7.5 mm Number of attempts: 1 Airway Equipment and Method: Stylet Placement Confirmation: ETT inserted through vocal cords under direct vision, positive ETCO2 and breath sounds checked- equal and bilateral Secured at: 22 cm Tube secured with: Tape Dental Injury: Teeth and Oropharynx as per pre-operative assessment

## 2021-04-24 NOTE — Procedures (Addendum)
Insertion of Chest Tube Procedure Note  Abigail Golden  [id number]  02/17/1966  Date:04/24/21  Time:4:24 PM    Provider Performing: Candee Furbish   Procedure: Pleural Catheter Insertion w/ Imaging Guidance [phone])  Indication(s) Right Pneumothorax  Consent Unable to obtain consent due to emergent nature of procedure.  Anesthesia Topical only with 1% lidocaine    Time Out Verified patient identification, verified procedure, site/side was marked, verified correct patient position, special equipment/implants available, medications/allergies/relevant history reviewed, required imaging and test results available.   Sterile Technique Maximal sterile technique including full sterile barrier drape, hand hygiene, sterile gown, sterile gloves, mask, hair covering, sterile ultrasound probe cover (if used).   Procedure Description Fluoro used to identify appropriate pleural anatomy for placement and overlying skin marked. Area of placement cleaned and draped in sterile fashion.  A 14 French pigtail pleural catheter was placed into the right pleural space using Seldinger technique. Appropriate return of air was obtained.  The tube was connected to atrium and placed on -20 cm H2O wall suction.   Complications/Tolerance None; patient tolerated the procedure well. Chest X-ray is ordered to verify placement.   EBL Minimal  Specimen(s) none

## 2021-04-24 NOTE — Progress Notes (Signed)
Full consult note to follow.  Patient currently off floor.     A/P: 55 year old with  Multiple intracranial masses concerning for metastasis  -Status post lung biopsy today -Recommend Decadron -We will follow-up with pathology for patient's candidacy and whether radiation versus surgical resection.  Likely leaning towards radiation giving sizes and location of lesions.   Thank you for allowing me to participate in this patient's care.  Please do not hesitate to call with questions or concerns.   Elwin Sleight, [facility] Cell: [phone]

## 2021-04-25 ENCOUNTER — Encounter (HOSPITAL_COMMUNITY): Payer: Self-pay | Admitting: Internal Medicine

## 2021-04-25 ENCOUNTER — Inpatient Hospital Stay (HOSPITAL_COMMUNITY): Payer: BC Managed Care – PPO

## 2021-04-25 DIAGNOSIS — G936 Cerebral edema: Secondary | ICD-10-CM | POA: Diagnosis not present

## 2021-04-25 DIAGNOSIS — G9389 Other specified disorders of brain: Secondary | ICD-10-CM | POA: Diagnosis not present

## 2021-04-25 DIAGNOSIS — R918 Other nonspecific abnormal finding of lung field: Secondary | ICD-10-CM | POA: Diagnosis not present

## 2021-04-25 DIAGNOSIS — J939 Pneumothorax, unspecified: Secondary | ICD-10-CM

## 2021-04-25 DIAGNOSIS — R900 Intracranial space-occupying lesion found on diagnostic imaging of central nervous system: Secondary | ICD-10-CM | POA: Diagnosis not present

## 2021-04-25 LAB — CBC WITH DIFFERENTIAL/PLATELET
Abs Immature Granulocytes: 0.05 10*3/uL (ref 0.00–0.07)
Basophils Absolute: 0 10*3/uL (ref 0.0–0.1)
Basophils Relative: 0 %
Eosinophils Absolute: 0 10*3/uL (ref 0.0–0.5)
Eosinophils Relative: 0 %
HCT: 37.4 % (ref 36.0–46.0)
Hemoglobin: 12.7 g/dL (ref 12.0–15.0)
Immature Granulocytes: 1 %
Lymphocytes Relative: 10 %
Lymphs Abs: 1.1 10*3/uL (ref 0.7–4.0)
MCH: 29.2 pg (ref 26.0–34.0)
MCHC: 34 g/dL (ref 30.0–36.0)
MCV: 86 fL (ref 80.0–100.0)
Monocytes Absolute: 0.9 10*3/uL (ref 0.1–1.0)
Monocytes Relative: 9 %
Neutro Abs: 8.6 10*3/uL — ABNORMAL HIGH (ref 1.7–7.7)
Neutrophils Relative %: 80 %
Platelets: 227 10*3/uL (ref 150–400)
RBC: 4.35 MIL/uL (ref 3.87–5.11)
RDW: 12.7 % (ref 11.5–15.5)
WBC: 10.7 10*3/uL — ABNORMAL HIGH (ref 4.0–10.5)
nRBC: 0 % (ref 0.0–0.2)

## 2021-04-25 LAB — COMPREHENSIVE METABOLIC PANEL
ALT: 16 U/L (ref 0–44)
AST: 19 U/L (ref 15–41)
Albumin: 3.8 g/dL (ref 3.5–5.0)
Alkaline Phosphatase: 56 U/L (ref 38–126)
Anion gap: 8 (ref 5–15)
BUN: 15 mg/dL (ref 6–20)
CO2: 23 mmol/L (ref 22–32)
Calcium: 8.9 mg/dL (ref 8.9–10.3)
Chloride: 104 mmol/L (ref 98–111)
Creatinine, Ser: 1.19 mg/dL — ABNORMAL HIGH (ref 0.44–1.00)
GFR, Estimated: 55 mL/min — ABNORMAL LOW (ref >60)
Glucose, Bld: 173 mg/dL — ABNORMAL HIGH (ref 70–99)
Potassium: 4.2 mmol/L (ref 3.5–5.1)
Sodium: 135 mmol/L (ref 135–145)
Total Bilirubin: 0.4 mg/dL (ref 0.3–1.2)
Total Protein: 7.1 g/dL (ref 6.5–8.1)

## 2021-04-25 LAB — GLUCOSE, CAPILLARY
Glucose-Capillary: 156 mg/dL — ABNORMAL HIGH (ref 70–99)
Glucose-Capillary: 138 mg/dL — ABNORMAL HIGH (ref 70–99)
Glucose-Capillary: 136 mg/dL — ABNORMAL HIGH (ref 70–99)
Glucose-Capillary: 134 mg/dL — ABNORMAL HIGH (ref 70–99)

## 2021-04-25 LAB — MAGNESIUM: Magnesium: 1.9 mg/dL (ref 1.7–2.4)

## 2021-04-25 LAB — BRAIN NATRIURETIC PEPTIDE: B Natriuretic Peptide: 38.5 pg/mL (ref 0.0–100.0)

## 2021-04-25 MED ORDER — MORPHINE SULFATE (PF) 2 MG/ML IV SOLN
2.0000 mg | INTRAVENOUS | Status: DC | PRN
Start: 2021-04-25 — End: 2021-04-30
  Administered 2021-04-25 – 2021-04-30 (×4): 2 mg via INTRAVENOUS
  Filled 2021-04-25 (×4): qty 1

## 2021-04-25 MED ORDER — OXYCODONE-ACETAMINOPHEN 5-325 MG PO TABS
1.0000 | ORAL_TABLET | Freq: Four times a day (QID) | ORAL | Status: DC | PRN
Start: 2021-04-25 — End: 2021-04-25

## 2021-04-25 MED ORDER — OXYCODONE-ACETAMINOPHEN 5-325 MG PO TABS
1.0000 | ORAL_TABLET | Freq: Four times a day (QID) | ORAL | Status: DC | PRN
  Administered 2021-04-25 (×2): 1 via ORAL
  Administered 2021-04-26 (×2): 2 via ORAL
  Filled 2021-04-25: qty 1
  Filled 2021-04-25 (×2): qty 2
  Filled 2021-04-25: qty 1

## 2021-04-25 MED ORDER — HEPARIN SODIUM (PORCINE) 5000 UNIT/ML IJ SOLN
5000.0000 [IU] | Freq: Three times a day (TID) | INTRAMUSCULAR | Status: DC
  Administered 2021-04-25 – 2021-04-30 (×15): 5000 [IU] via SUBCUTANEOUS
  Filled 2021-04-25 (×14): qty 1

## 2021-04-25 NOTE — Progress Notes (Signed)
PROGRESS NOTE                                                                                                                                                                                                             Patient Demographics:    Abigail Golden, is a 55 y.o. female, DOB - 06/13/1966, RKY:[id number]  Outpatient Primary MD for the patient is Aletha Halim., PA-C    LOS - 1  Admit date - 04/23/2021    No chief complaint on file.      Brief Narrative (HPI from H&P)   Abigail Golden is a 55 y.o. female with history of migraine and allergies presents to the ER after outpatient CT scan showing a mass, MRI brain here confirmed brain mass along further work-up also suggests primary lung cancer with mets to the brain.  She was admitted for further work-up.  She had a biopsy of left upper lobe mass via bronchoscopy 04/24/2021, she has right lung Pneumothorax after procedure, where she required chest tube.   Subjective:    Lileigh Fahringer today report pain at the insertion site of chest tube much improved currently, she denies any headache at this point.     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 -work-up suspicious for primary metastatic lung cancer, this post biopsy, prelim reading for non-small cell carcinoma. -Continue with IV steroids for brain mets. -Neurosurgery consulted, radiation versus surgery. -Oncology consulted regarding further recommendations, discussed with oncology, they will consult radiation oncology as well..   Iatrogenic right pneumothorax -Management per PCCM -Chest tube care per PCCM.   Morbid obesity with BMI of 50.  Follow with PCP.  History of allergies and migraines.  Supportive care.      Condition - Extremely Guarded  Family Communication  :  none at  bedside  Code Status :  Full  Consults  : PCCM, Oncology,neurosurgery   Procedures:  -Chest tube/bronchoscopy by [facility] 04/24/2021    Procedures  :     CT Chest - Abd and Pelvis - 4.5 cm spiculated left upper lobe mass most compatible with primary lung cancer. This abuts the medial pleural surface. A few small adjacent pre-vascular mediastinal lymph nodes, none pathologically enlarged. Recommend  cardiothoracic surgical and oncologic consultation. No acute findings or evidence of metastatic disease in the abdomen or pelvis  MRI Brain  - 1. Metastatic disease to the brain. Two enhancing brain masses with moderate associated vasogenic edema: solid 16 mm left parietal lobe mass, and a larger 29 mm rim enhancing cystic or necrotic mass in the left inferior frontal gyrus. Two other small 2-3 mm metastases in the right inferior frontal gyrus, right posterior temporal lobe. And questionable two additional punctate metastases in both superior frontal gyri abutting the falx (series 18, image 46). 2. No significant intracranial mass effect. No other intracranial abnormality      Disposition Plan  :    Status is: Observation  DVT Prophylaxis  :    Place and maintain sequential compression device Start: 04/24/21 0755  Lab Results  Component Value Date   PLT 227 04/25/2021    Diet :  Diet Order             Diet heart healthy/carb modified Room service appropriate? Yes; Fluid consistency: Thin  Diet effective now                    Inpatient Medications  Scheduled Meds:  citalopram  10 mg Oral QHS   darifenacin  7.5 mg Oral QHS   dexamethasone (DECADRON) injection  4 mg Intravenous Q6H   diphenhydrAMINE  25 mg Oral QHS   hydrOXYzine  25 mg Oral QHS   insulin aspart  0-5 Units Subcutaneous QHS   insulin aspart  0-9 Units Subcutaneous TID WC   loratadine  10 mg Oral Daily   montelukast  10 mg Oral QHS   sodium chloride flush  10 mL Other Q8H   Continuous Infusions:  lactated  ringers 10 mL/hr at 04/24/21 1357   PRN Meds:.acetaminophen **OR** acetaminophen, albuterol, ALPRAZolam, diphenhydrAMINE, hydrOXYzine, morphine injection, oxyCODONE-acetaminophen  Antibiotics  :    Anti-infectives (From admission, onward)    None         Phillips Climes M.D on 04/25/2021 at 11:53 AM  To page go to [email or web address]   [facility] -  Office  [phone]  See all Orders from today for further details    Objective:   Vitals:   04/24/21 1839 04/24/21 2054 04/25/21 0600 04/25/21 0742  BP: 138/86 (!) 98/52 103/64 115/64  Pulse: (!) 111 (!) 102 76 75  Resp: (!) 21 20 18 20   Temp: 98.7 F (37.1 C) 97.9 F (36.6 C) 97.9 F (36.6 C) 98.1 F (36.7 C)  TempSrc: Oral Oral Oral Oral  SpO2: 94% 95% 100% 95%  Weight:      Height:        Wt Readings from Last 3 Encounters:  04/24/21 127 kg  09/08/18 120.2 kg  02/27/17 122.5 kg     Intake/Output Summary (Last 24 hours) at 04/25/2021 1153 Last data filed at 04/24/2021 1800 Gross per 24 hour  Intake 1150 ml  Output 45 ml  Net 1105 ml     Physical Exam  Awake Alert, Oriented X 3, No new F.N deficits, Normal affect Symmetrical Chest wall movement, Good air movement bilaterally, CTAB RRR,No Gallops,Rubs or new Murmurs, No Parasternal Heave +ve B.Sounds, Abd Soft, No tenderness, No rebound - guarding or rigidity. No Cyanosis, Clubbing or edema, No new Rash or bruise      Data Review:    CBC Recent Labs  Lab 04/22/21 2042 04/24/21 0520 04/25/21 0316  WBC 5.5 4.6 10.7*  HGB 12.7 13.0 12.7  HCT 38.5 37.8 37.4  PLT 236 229 227  MCV 87.5 86.9 86.0  MCH 28.9 29.9 29.2  MCHC 33.0 34.4 34.0  RDW 12.6 12.6 12.7  LYMPHSABS 2.8 1.2 1.1  MONOABS 0.5 0.1 0.9  EOSABS 0.0 0.0 0.0  BASOSABS 0.0 0.0 0.0    Recent Labs  Lab 04/22/21 2042 04/24/21 0520 04/24/21 0802 04/24/21 2212 04/25/21 0316  NA 137 133*  --   --  135  K 3.7 4.3  --   --  4.2  CL 104 104  --   --  104  CO2 26 18*  --   --   23  GLUCOSE 104* 166*  --   --  173*  BUN 11 10  --   --  15  CREATININE 0.94 0.77  --   --  1.19*  CALCIUM 9.0 8.9  --   --  8.9  AST 16 17  --   --  19  ALT 13 15  --   --  16  ALKPHOS 55 56  --   --  56  BILITOT 0.7 0.7  --   --  0.4  ALBUMIN 3.6 3.5  --   --  3.8  MG  --   --   --   --  1.9  INR  --   --  1.1  --   --   HGBA1C  --   --   --  5.2  --   BNP  --   --   --   --  38.5    ------------------------------------------------------------------------------------------------------------------ No results for input(s): CHOL, HDL, LDLCALC, TRIG, CHOLHDL, LDLDIRECT in the last 72 hours.  Lab Results  Component Value Date   HGBA1C 5.2 04/24/2021   ------------------------------------------------------------------------------------------------------------------ No results for input(s): TSH, T4TOTAL, T3FREE, THYROIDAB in the last 72 hours.  Invalid input(s): FREET3  Cardiac Enzymes No results for input(s): CKMB, TROPONINI, MYOGLOBIN in the last 168 hours.  Invalid input(s): CK ------------------------------------------------------------------------------------------------------------------    Component Value Date/Time   BNP 38.5 04/25/2021 0316     Radiology Reports DG Chest 1 View  Result Date: 04/24/2021 CLINICAL DATA:  Post bronchoscopy and right chest tube placement. Pneumothorax. EXAM: CHEST  1 VIEW COMPARISON:  Chest CT 04/23/2021 FINDINGS: Left upper lobe mass noted as seen on prior CT. Right chest tube in place. Lucency noted over the right lateral chest and right apex concerning for pneumothorax, but the pleural edge is difficult to visualize on this portable study. Right basilar opacity, likely atelectasis. No visible effusions. IMPRESSION: Right chest tube in place. Lucency over the lateral and upper right hemithorax likely reflects pneumothorax, but pleural edge is difficult to visualize. Left upper lobe mass again noted. Electronically Signed   By: Rolm Baptise  M.D.   On: 04/24/2021 18:06   CT HEAD WO CONTRAST (5MM)  Result Date: 04/22/2021 CLINICAL DATA:  Worsening headaches.  Dizziness and lightheaded. EXAM: CT HEAD WITHOUT CONTRAST TECHNIQUE: Contiguous axial images were obtained from the base of the skull through the vertex without intravenous contrast. COMPARISON:  Brain MRI 07/15/2018 FINDINGS: Brain: No abnormality seen affecting the brainstem or cerebellum. There are 2 separate areas of vasogenic edema, 1 within the inferior left frontal region in the other at the left parietal vertex. There are presumed underlying masses in both of those areas, though they are not clearly depicted. No evidence of hemorrhage. No left-to-right shift. No abnormality seen in the right hemisphere. No extra-axial fluid collection. The differential diagnosis  includes venous infarctions and cerebritis. MRI with contrast strongly recommended. Vascular: No abnormal vascular finding. Skull: Normal Sinuses/Orbits: Clear/normal Other: None IMPRESSION: Two separate areas of vasogenic edema within the left hemisphere, 1 within the inferior frontal region in the other at the left parietal vertex. Metastatic disease would be the most likely cause of this appearance. Other possibilities include venous infarctions and cerebritis. MRI with contrast recommended. Call report in progress. Electronically Signed   By: Nelson Chimes M.D.   On: 04/22/2021 16:07   CT Chest W Contrast  Result Date: 04/23/2021 CLINICAL DATA:  Metastatic disease evaluation. Brain metastases on MRI. EXAM: CT CHEST, ABDOMEN, AND PELVIS WITH CONTRAST TECHNIQUE: Multidetector CT imaging of the chest, abdomen and pelvis was performed following the standard protocol during bolus administration of intravenous contrast. CONTRAST:  55mL OMNIPAQUE IOHEXOL 300 MG/ML  SOLN COMPARISON:  05/25/2015 FINDINGS: CT CHEST FINDINGS Cardiovascular: Heart is normal size. Aorta is normal caliber. Mediastinum/Nodes: No mediastinal, hilar, or  axillary adenopathy. Trachea and esophagus are unremarkable. Thyroid unremarkable. Lungs/Pleura: 4.5 cm left upper lobe spiculated mass compatible with primary lung cancer. No pleural effusions. This abuts the medial pleural surface and mediastinum. Few small adjacent pre-vascular lymph nodes, not pathologically enlarged based on CT size criteria. Musculoskeletal: Chest wall soft tissues are unremarkable. No acute bony abnormality. CT ABDOMEN PELVIS FINDINGS Hepatobiliary: Insert paddle biliary Pancreas: No focal abnormality or ductal dilatation. Spleen: No focal abnormality.  Normal size. Adrenals/Urinary Tract: No adrenal abnormality. No focal renal abnormality. No stones or hydronephrosis. Urinary bladder is unremarkable. Stomach/Bowel: Stomach, large and small bowel grossly unremarkable. Vascular/Lymphatic: No evidence of aneurysm or adenopathy. Reproductive: Prior hysterectomy.  No adnexal masses. Other: No free fluid or free air. Musculoskeletal: No acute bony abnormality. IMPRESSION: 4.5 cm spiculated left upper lobe mass most compatible with primary lung cancer. This abuts the medial pleural surface. A few small adjacent pre-vascular mediastinal lymph nodes, none pathologically enlarged. Recommend cardiothoracic surgical and oncologic consultation. No acute findings or evidence of metastatic disease in the abdomen or pelvis. Electronically Signed   By: Rolm Baptise M.D.   On: 04/23/2021 22:38   MR Brain W and Wo Contrast  Result Date: 04/23/2021 CLINICAL DATA:  56 year old female with increasing headaches. Multifocal vasogenic edema in the brain on plain head CT yesterday. EXAM: MRI HEAD WITHOUT AND WITH CONTRAST TECHNIQUE: Multiplanar, multiecho pulse sequences of the brain and surrounding structures were obtained without and with intravenous contrast. CONTRAST:  37mL GADAVIST GADOBUTROL 1 MMOL/ML IV SOLN COMPARISON:  Head CT 04/22/2021. Brain MRI without contrast 07/15/2018. FINDINGS: Brain: Solid  enhancing 16 mm mass in the posterosuperior left parietal lobe with moderate regional vasogenic edema (series 18, image 41 and series 20, image 18). Only mild regional mass effect. Large up to 29 mm rim enhancing cystic or necrotic mass in the left hemisphere at the inferior frontal gyrus junction with the temporal lobe (series 18, image 22 and series 19, image 21. Moderate vasogenic edema in the region but only mild regional mass effect - including on the anterior left temporal tip. Punctate 2-3 mm abnormal enhancement in the posterior right temporal lobe seen on both series 20, image 6 and series 19, image 14. Subtle similar right inferior frontal gyrus lesion best seen on series 18, image 24 and series 20, image 13. No associated edema. And suspicion of 2 similar small nodular areas of enhancement in the superior frontal gyri abutting the midline. See series 18, image 46 and series 19, images 19 and 17. Less  likely these could be dural based. No edema. No dural thickening or enhancement elsewhere. No superimposed restricted diffusion to suggest acute infarction. No midline shift, ventriculomegaly, extra-axial collection or acute intracranial hemorrhage. Cervicomedullary junction and pituitary are within normal limits. Normal background gray and white matter signal. No cortical encephalomalacia or chronic cerebral blood products. Vascular: Major intracranial vascular flow voids in the are stable since 2020. Dominant left vertebral artery. The major dural venous sinuses are enhancing and appear to be patent. Skull and upper cervical spine: Negative. Visualized bone marrow signal is within normal limits. Sinuses/Orbits: Negative. Other: Trace right mastoid fluid. Negative nasopharynx. Negative visible scalp and face. IMPRESSION: 1. Metastatic disease to the brain. Two enhancing brain masses with moderate associated vasogenic edema: solid 16 mm left parietal lobe mass, and a larger 29 mm rim enhancing cystic or  necrotic mass in the left inferior frontal gyrus. Two other small 2-3 mm metastases in the right inferior frontal gyrus, right posterior temporal lobe. And questionable two additional punctate metastases in both superior frontal gyri abutting the falx (series 18, image 46). 2. No significant intracranial mass effect. No other intracranial abnormality. Electronically Signed   By: Genevie Ann M.D.   On: 04/23/2021 06:41   CT Abdomen Pelvis W Contrast  Result Date: 04/23/2021 CLINICAL DATA:  Metastatic disease evaluation. Brain metastases on MRI. EXAM: CT CHEST, ABDOMEN, AND PELVIS WITH CONTRAST TECHNIQUE: Multidetector CT imaging of the chest, abdomen and pelvis was performed following the standard protocol during bolus administration of intravenous contrast. CONTRAST:  70mL OMNIPAQUE IOHEXOL 300 MG/ML  SOLN COMPARISON:  05/25/2015 FINDINGS: CT CHEST FINDINGS Cardiovascular: Heart is normal size. Aorta is normal caliber. Mediastinum/Nodes: No mediastinal, hilar, or axillary adenopathy. Trachea and esophagus are unremarkable. Thyroid unremarkable. Lungs/Pleura: 4.5 cm left upper lobe spiculated mass compatible with primary lung cancer. No pleural effusions. This abuts the medial pleural surface and mediastinum. Few small adjacent pre-vascular lymph nodes, not pathologically enlarged based on CT size criteria. Musculoskeletal: Chest wall soft tissues are unremarkable. No acute bony abnormality. CT ABDOMEN PELVIS FINDINGS Hepatobiliary: Insert paddle biliary Pancreas: No focal abnormality or ductal dilatation. Spleen: No focal abnormality.  Normal size. Adrenals/Urinary Tract: No adrenal abnormality. No focal renal abnormality. No stones or hydronephrosis. Urinary bladder is unremarkable. Stomach/Bowel: Stomach, large and small bowel grossly unremarkable. Vascular/Lymphatic: No evidence of aneurysm or adenopathy. Reproductive: Prior hysterectomy.  No adnexal masses. Other: No free fluid or free air. Musculoskeletal: No  acute bony abnormality. IMPRESSION: 4.5 cm spiculated left upper lobe mass most compatible with primary lung cancer. This abuts the medial pleural surface. A few small adjacent pre-vascular mediastinal lymph nodes, none pathologically enlarged. Recommend cardiothoracic surgical and oncologic consultation. No acute findings or evidence of metastatic disease in the abdomen or pelvis. Electronically Signed   By: Rolm Baptise M.D.   On: 04/23/2021 22:38   DG Chest Port 1 View  Result Date: 04/25/2021 CLINICAL DATA:  Chest tube, pneumothorax EXAM: PORTABLE CHEST 1 VIEW COMPARISON:  Chest x-ray 04/24/2021 FINDINGS: Right-sided chest tube in place. Interval expansion of the right lung with persistent small pneumothorax measuring approximately 12 mm from the apex. Stable masslike opacity in the left upper lung zone. Cardiomediastinal silhouette is unchanged. No significant pleural effusion visualized. IMPRESSION: Small right pneumothorax, improved since previous study. Electronically Signed   By: Ofilia Neas M.D.   On: 04/25/2021 08:48   DG CHEST PORT 1 VIEW  Result Date: 04/24/2021 CLINICAL DATA:  Follow-up pneumothorax. EXAM: PORTABLE CHEST 1 VIEW COMPARISON:  1-1/2 hours ago. FINDINGS: Pigtail catheter projects over the right mid lower lung zone. Unchanged size of moderate right pneumothorax. Pleural edge is at least 3 cm from the lung apex. There is increasing opacity in the medial right lung base. No mediastinal shift. Low lung volumes with masslike opacity in the left upper lobe unchanged. Otherwise unchanged exam. IMPRESSION: 1. Unchanged size of moderate right pneumothorax. Right chest tube remains in place. 2. Increasing opacity in the medial right lung base, likely atelectasis. 3. Unchanged masslike opacity in the left upper lobe. Electronically Signed   By: Keith Rake M.D.   On: 04/24/2021 19:23   EEG adult  Result Date: 04/24/2021 Derek Jack, MD     04/24/2021  2:05 PM Routine EEG  Report AUBRIELLE STROUD is a 55 y.o. female with a history of presyncope who is undergoing an EEG to evaluate for seizures. Report: This EEG was acquired with electrodes placed according to the International 10-20 electrode system (including Fp1, Fp2, F3, F4, C3, C4, P3, P4, O1, O2, T3, T4, T5, T6, A1, A2, Fz, Cz, Pz). The following electrodes were missing or displaced: none. The occipital dominant rhythm was 9 Hz. This activity is reactive to stimulation. Drowsiness was manifested by background fragmentation; deeper stages of sleep were not identified. There was intermittent focal slowing over the left posterior region. There were no interictal epileptiform discharges. There were no electrographic seizures identified. Photic stimulation and hyperventilation were not performed. Impression and clinical correlation: This EEG was obtained while awake and drowsy and is abnormal due to intermittent focal slowing over the left posterior region, indicative of focal cerebral dysfunction in that area. There were no electrographic seizures observed during this recording. Su Monks, MD [facility] [phone] If 7pm- 7am, please page neurology on call as listed in [place].   DG C-ARM BRONCHOSCOPY  Result Date: 04/24/2021 C-ARM BRONCHOSCOPY: Fluoroscopy was utilized by the requesting physician.  No radiographic interpretation.

## 2021-04-25 NOTE — Consult Note (Addendum)
[place]  [phone] [phone]   MEDICAL ONCOLOGY - INITIAL CONSULTATION  Referral MD: Dr. Phillips Climes  Reason for Referral: Left upper lobe lung mass, brain lesion  HPI: [name] is a 55 year old female with a past medical history significant for allergies and migraines.  She presented to the emergency department after an outpatient CT showed an intracranial mass concerning for neoplasm.  She has a history of migraine headaches and has been experiencing occipital headaches which have been progressively getting higher in intensity and frequency.  She was also having some intermittent nausea and vomiting and photophobia.  In the emergency department, she had an MRI of the brain with and without contrast which showed metastatic disease to the brain with 2 enhancing brain masses with moderate associated vasogenic edema in the left inferior frontal gyrus and 2 other small 2 to 3 mm metastases in the right inferior frontal gyrus and right posterior temporal lobe.  CT chest/abdomen/pelvis with contrast was performed on 04/23/2021 which showed a 4.5 cm spiculated left upper lobe mass most compatible with primary lung cancer, mass abuts the medial pleural space, a few small adjacent prevascular mediastinal lymph nodes none pathologically enlarged.  There was no evidence of metastatic disease in the abdomen or pelvis.  She was seen by pulmonology and underwent bronchoscopy on 04/24/2021.  Bronchoscopy complicated by pneumothorax.  Has pigtail catheter in place.  Cytology pending.  She has also been seen by neurosurgery who recommends continuation of dexamethasone and await pathology from biopsies taken yesterday.  They anticipate need for radiation once biopsies have resulted.  I met with the patient and 2 of her friends were at the bedside.  The patient states that her headaches have improved significantly since starting dexamethasone.  She noted that she was having  photophobia and a little bit of double vision prior to admission.  She also states that she felt like she was dizzy and that she was going to pass out prior to admission.  The symptoms have now resolved.  She reported that her appetite was slightly diminished prior to admission but she has not lost any significant weight.  She reports some mild discomfort in her chest at the chest tube insertion site.  She has not having any significant shortness of breath.  She denies abdominal pain, nausea, vomiting.  The patient is single.  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  She denies history of alcohol and tobacco use.  Medical oncology was asked see the patient for recommendations regarding her lung mass and brain lesions.   Past Medical History:  Diagnosis Date   Allergy    Anxiety    Chronic bronchitis ([place])    Depression    Headache    Hypoglycemia    Lower back pain    since fall ~ 2012 (05/25/2015)   Migraine    05/25/2015 "probably once/month"   Migraine headache    Pain in left hip    since fall ~ 2012 (05/25/2015)   Pneumonia "several times"  :   Past Surgical History:  Procedure Laterality Date   ABDOMINAL HYSTERECTOMY  11/2002   "found benign tumors"   CHOLECYSTECTOMY N/A 05/25/2015   Procedure: LAPAROSCOPIC CHOLECYSTECTOMY;  Surgeon: Ralene Ok, MD;  Location: [place];  Service: General;  Laterality: N/A;   LAPAROSCOPIC CHOLECYSTECTOMY  05/25/2015   MENISCUS REPAIR Right 03/2017  :   Current Facility-Administered Medications  Medication Dose Route Frequency Provider Last Rate Last Admin  acetaminophen (TYLENOL) tablet 650 mg  650 mg Oral Q6H PRN Rise Patience, MD       Or   acetaminophen (TYLENOL) suppository 650 mg  650 mg Rectal Q6H PRN Rise Patience, MD       albuterol (PROVENTIL) (2.5 MG/3ML) 0.083% nebulizer solution 2.5 mg  2.5 mg Nebulization Q6H PRN Icard, Bradley L, DO       ALPRAZolam Duanne Moron) tablet 0.25 mg  0.25 mg Oral Q4H PRN  Rise Patience, MD   0.25 mg at 04/24/21 2223   citalopram (CELEXA) tablet 10 mg  10 mg Oral QHS Rise Patience, MD   10 mg at 04/24/21 2107   darifenacin (ENABLEX) 24 hr tablet 7.5 mg  7.5 mg Oral QHS Rise Patience, MD   7.5 mg at 04/24/21 2224   dexamethasone (DECADRON) injection 4 mg  4 mg Intravenous Q6H Rise Patience, MD   4 mg at 04/25/21 1156   diphenhydrAMINE (BENADRYL) capsule 25 mg  25 mg Oral QHS Rise Patience, MD   25 mg at 04/24/21 2107   diphenhydrAMINE (BENADRYL) capsule 25 mg  25 mg Oral Daily PRN Rise Patience, MD       heparin injection 5,000 Units  5,000 Units Subcutaneous Q8H Elgergawy, Silver Huguenin, MD   5,000 Units at 04/25/21 1441   hydrOXYzine (ATARAX/VISTARIL) tablet 25 mg  25 mg Oral QHS Rise Patience, MD   25 mg at 04/24/21 2107   hydrOXYzine (ATARAX/VISTARIL) tablet 25 mg  25 mg Oral Daily PRN Rise Patience, MD       insulin aspart (novoLOG) injection 0-5 Units  0-5 Units Subcutaneous QHS Thurnell Lose, MD       insulin aspart (novoLOG) injection 0-9 Units  0-9 Units Subcutaneous TID WC Thurnell Lose, MD   1 Units at 04/25/21 1156   lactated ringers infusion   Intravenous Continuous Janeece Riggers, MD 10 mL/hr at 04/24/21 1357 New Bag at 04/24/21 1357   loratadine (CLARITIN) tablet 10 mg  10 mg Oral Daily Rise Patience, MD   10 mg at 04/25/21 1156   montelukast (SINGULAIR) tablet 10 mg  10 mg Oral QHS Rise Patience, MD   10 mg at 04/24/21 2106   morphine 2 MG/ML injection 2-4 mg  2-4 mg Intravenous Q4H PRN Candee Furbish, MD       oxyCODONE-acetaminophen (PERCOCET/ROXICET) 5-325 MG per tablet 1-2 tablet  1-2 tablet Oral Q6H PRN Candee Furbish, MD   1 tablet at 04/25/21 1157   sodium chloride flush (NS) 0.9 % injection 10 mL  10 mL Other Q8H Candee Furbish, MD   10 mL at 04/25/21 2376      Allergies  Allergen Reactions   Celebrex [Celecoxib] Rash    On face itching   Minocycline Rash    On  face Itching  :   Family History  Problem Relation Age of Onset   Heart disease Mother    Delorse Limber White syndrome Father    Yves Dill Parkinson White syndrome Paternal Grandmother    Diabetes Paternal Grandmother    Breast cancer Neg Hx   :   Social History   Socioeconomic History   Marital status: Single    Spouse name: Not on file   Number of children: 0   Years of education: 2 years of college   Highest education level: Not on file  Occupational History   Not on file  Tobacco Use  Smoking status: Never   Smokeless tobacco: Never  Substance and Sexual Activity   Alcohol use: Never   Drug use: Never   Sexual activity: Not on file  Other Topics Concern   Not on file  Social History Narrative   Lives at home alone   Caffeine: 16 oz daily   Social Determinants of Health   Financial Resource Strain: Not on file  Food Insecurity: Not on file  Transportation Needs: Not on file  Physical Activity: Not on file  Stress: Not on file  Social Connections: Not on file  Intimate Partner Violence: Not on file  :  Review of Systems: A comprehensive 14 point review of systems was negative except as noted in the HPI.  Exam: Patient Vitals for the past 24 hrs:  BP Temp Temp src Pulse Resp SpO2  04/25/21 1440 135/64 97.6 F (36.4 C) -- 80 16 97 %  04/25/21 0742 115/64 98.1 F (36.7 C) Oral 75 20 95 %  04/25/21 0600 103/64 97.9 F (36.6 C) Oral 76 18 100 %  04/24/21 2054 (!) 98/52 97.9 F (36.6 C) Oral (!) 102 20 95 %  04/24/21 1839 138/86 98.7 F (37.1 C) Oral (!) 111 (!) 21 94 %  04/24/21 1807 -- -- -- 98 (!) 31 97 %  04/24/21 1806 -- -- -- 75 (!) 29 97 %  04/24/21 1802 128/63 (!) 97.2 F (36.2 C) -- -- (!) 25 97 %  04/24/21 1745 138/69 -- -- 97 (!) 23 100 %  04/24/21 1730 (!) 141/83 -- -- (!) 103 (!) 26 91 %  04/24/21 1715 (!) 118/108 -- -- (!) 106 19 94 %  04/24/21 1655 (!) 156/74 100.1 F (37.8 C) -- (!) 108 (!) 24 92 %    General:  well-nourished in no  acute distress.   Eyes:  no scleral icterus.   ENT:  There were no oropharyngeal lesions.   Lymphatics:  Negative cervical, supraclavicular or axillary adenopathy.   Respiratory: lungs were clear bilaterally without wheezing or crackles.   Cardiovascular:  Regular rate and rhythm, S1/S2, without murmur, rub or gallop.  There was no pedal edema.   GI:  abdomen was soft, flat, nontender, nondistended, without organomegaly.    Skin exam was without echymosis, petichae.   Neuro exam was nonfocal. Patient was alert and oriented.  Attention was good.   Language was appropriate.  Mood was normal without depression.  Speech was not pressured.  Thought content was not tangential.     Lab Results  Component Value Date   WBC 10.7 (H) 04/25/2021   HGB 12.7 04/25/2021   HCT 37.4 04/25/2021   PLT 227 04/25/2021   GLUCOSE 173 (H) 04/25/2021   ALT 16 04/25/2021   AST 19 04/25/2021   NA 135 04/25/2021   K 4.2 04/25/2021   CL 104 04/25/2021   CREATININE 1.19 (H) 04/25/2021   BUN 15 04/25/2021   CO2 23 04/25/2021    DG Chest 1 View  Result Date: 04/24/2021 CLINICAL DATA:  Post bronchoscopy and right chest tube placement. Pneumothorax. EXAM: CHEST  1 VIEW COMPARISON:  Chest CT 04/23/2021 FINDINGS: Left upper lobe mass noted as seen on prior CT. Right chest tube in place. Lucency noted over the right lateral chest and right apex concerning for pneumothorax, but the pleural edge is difficult to visualize on this portable study. Right basilar opacity, likely atelectasis. No visible effusions. IMPRESSION: Right chest tube in place. Lucency over the lateral and upper right hemithorax  likely reflects pneumothorax, but pleural edge is difficult to visualize. Left upper lobe mass again noted. Electronically Signed   By: Rolm Baptise M.D.   On: 04/24/2021 18:06   CT HEAD WO CONTRAST (5MM)  Result Date: 04/22/2021 CLINICAL DATA:  Worsening headaches.  Dizziness and lightheaded. EXAM: CT HEAD WITHOUT CONTRAST  TECHNIQUE: Contiguous axial images were obtained from the base of the skull through the vertex without intravenous contrast. COMPARISON:  Brain MRI 07/15/2018 FINDINGS: Brain: No abnormality seen affecting the brainstem or cerebellum. There are 2 separate areas of vasogenic edema, 1 within the inferior left frontal region in the other at the left parietal vertex. There are presumed underlying masses in both of those areas, though they are not clearly depicted. No evidence of hemorrhage. No left-to-right shift. No abnormality seen in the right hemisphere. No extra-axial fluid collection. The differential diagnosis includes venous infarctions and cerebritis. MRI with contrast strongly recommended. Vascular: No abnormal vascular finding. Skull: Normal Sinuses/Orbits: Clear/normal Other: None IMPRESSION: Two separate areas of vasogenic edema within the left hemisphere, 1 within the inferior frontal region in the other at the left parietal vertex. Metastatic disease would be the most likely cause of this appearance. Other possibilities include venous infarctions and cerebritis. MRI with contrast recommended. Call report in progress. Electronically Signed   By: Nelson Chimes M.D.   On: 04/22/2021 16:07   CT Chest W Contrast  Result Date: 04/23/2021 CLINICAL DATA:  Metastatic disease evaluation. Brain metastases on MRI. EXAM: CT CHEST, ABDOMEN, AND PELVIS WITH CONTRAST TECHNIQUE: Multidetector CT imaging of the chest, abdomen and pelvis was performed following the standard protocol during bolus administration of intravenous contrast. CONTRAST:  24m OMNIPAQUE IOHEXOL 300 MG/ML  SOLN COMPARISON:  05/25/2015 FINDINGS: CT CHEST FINDINGS Cardiovascular: Heart is normal size. Aorta is normal caliber. Mediastinum/Nodes: No mediastinal, hilar, or axillary adenopathy. Trachea and esophagus are unremarkable. Thyroid unremarkable. Lungs/Pleura: 4.5 cm left upper lobe spiculated mass compatible with primary lung cancer. No pleural  effusions. This abuts the medial pleural surface and mediastinum. Few small adjacent pre-vascular lymph nodes, not pathologically enlarged based on CT size criteria. Musculoskeletal: Chest wall soft tissues are unremarkable. No acute bony abnormality. CT ABDOMEN PELVIS FINDINGS Hepatobiliary: Insert paddle biliary Pancreas: No focal abnormality or ductal dilatation. Spleen: No focal abnormality.  Normal size. Adrenals/Urinary Tract: No adrenal abnormality. No focal renal abnormality. No stones or hydronephrosis. Urinary bladder is unremarkable. Stomach/Bowel: Stomach, large and small bowel grossly unremarkable. Vascular/Lymphatic: No evidence of aneurysm or adenopathy. Reproductive: Prior hysterectomy.  No adnexal masses. Other: No free fluid or free air. Musculoskeletal: No acute bony abnormality. IMPRESSION: 4.5 cm spiculated left upper lobe mass most compatible with primary lung cancer. This abuts the medial pleural surface. A few small adjacent pre-vascular mediastinal lymph nodes, none pathologically enlarged. Recommend cardiothoracic surgical and oncologic consultation. No acute findings or evidence of metastatic disease in the abdomen or pelvis. Electronically Signed   By: KRolm BaptiseM.D.   On: 04/23/2021 22:38   MR Brain W and Wo Contrast  Result Date: 04/23/2021 CLINICAL DATA:  55year old female with increasing headaches. Multifocal vasogenic edema in the brain on plain head CT yesterday. EXAM: MRI HEAD WITHOUT AND WITH CONTRAST TECHNIQUE: Multiplanar, multiecho pulse sequences of the brain and surrounding structures were obtained without and with intravenous contrast. CONTRAST:  82mGADAVIST GADOBUTROL 1 MMOL/ML IV SOLN COMPARISON:  Head CT 04/22/2021. Brain MRI without contrast 07/15/2018. FINDINGS: Brain: Solid enhancing 16 mm mass in the posterosuperior left parietal lobe with  moderate regional vasogenic edema (series 18, image 41 and series 20, image 18). Only mild regional mass effect. Large up  to 29 mm rim enhancing cystic or necrotic mass in the left hemisphere at the inferior frontal gyrus junction with the temporal lobe (series 18, image 22 and series 19, image 21. Moderate vasogenic edema in the region but only mild regional mass effect - including on the anterior left temporal tip. Punctate 2-3 mm abnormal enhancement in the posterior right temporal lobe seen on both series 20, image 6 and series 19, image 14. Subtle similar right inferior frontal gyrus lesion best seen on series 18, image 24 and series 20, image 13. No associated edema. And suspicion of 2 similar small nodular areas of enhancement in the superior frontal gyri abutting the midline. See series 18, image 46 and series 19, images 19 and 17. Less likely these could be dural based. No edema. No dural thickening or enhancement elsewhere. No superimposed restricted diffusion to suggest acute infarction. No midline shift, ventriculomegaly, extra-axial collection or acute intracranial hemorrhage. Cervicomedullary junction and pituitary are within normal limits. Normal background gray and white matter signal. No cortical encephalomalacia or chronic cerebral blood products. Vascular: Major intracranial vascular flow voids in the are stable since 2020. Dominant left vertebral artery. The major dural venous sinuses are enhancing and appear to be patent. Skull and upper cervical spine: Negative. Visualized bone marrow signal is within normal limits. Sinuses/Orbits: Negative. Other: Trace right mastoid fluid. Negative nasopharynx. Negative visible scalp and face. IMPRESSION: 1. Metastatic disease to the brain. Two enhancing brain masses with moderate associated vasogenic edema: solid 16 mm left parietal lobe mass, and a larger 29 mm rim enhancing cystic or necrotic mass in the left inferior frontal gyrus. Two other small 2-3 mm metastases in the right inferior frontal gyrus, right posterior temporal lobe. And questionable two additional punctate  metastases in both superior frontal gyri abutting the falx (series 18, image 46). 2. No significant intracranial mass effect. No other intracranial abnormality. Electronically Signed   By: Genevie Ann M.D.   On: 04/23/2021 06:41   CT Abdomen Pelvis W Contrast  Result Date: 04/23/2021 CLINICAL DATA:  Metastatic disease evaluation. Brain metastases on MRI. EXAM: CT CHEST, ABDOMEN, AND PELVIS WITH CONTRAST TECHNIQUE: Multidetector CT imaging of the chest, abdomen and pelvis was performed following the standard protocol during bolus administration of intravenous contrast. CONTRAST:  37m OMNIPAQUE IOHEXOL 300 MG/ML  SOLN COMPARISON:  05/25/2015 FINDINGS: CT CHEST FINDINGS Cardiovascular: Heart is normal size. Aorta is normal caliber. Mediastinum/Nodes: No mediastinal, hilar, or axillary adenopathy. Trachea and esophagus are unremarkable. Thyroid unremarkable. Lungs/Pleura: 4.5 cm left upper lobe spiculated mass compatible with primary lung cancer. No pleural effusions. This abuts the medial pleural surface and mediastinum. Few small adjacent pre-vascular lymph nodes, not pathologically enlarged based on CT size criteria. Musculoskeletal: Chest wall soft tissues are unremarkable. No acute bony abnormality. CT ABDOMEN PELVIS FINDINGS Hepatobiliary: Insert paddle biliary Pancreas: No focal abnormality or ductal dilatation. Spleen: No focal abnormality.  Normal size. Adrenals/Urinary Tract: No adrenal abnormality. No focal renal abnormality. No stones or hydronephrosis. Urinary bladder is unremarkable. Stomach/Bowel: Stomach, large and small bowel grossly unremarkable. Vascular/Lymphatic: No evidence of aneurysm or adenopathy. Reproductive: Prior hysterectomy.  No adnexal masses. Other: No free fluid or free air. Musculoskeletal: No acute bony abnormality. IMPRESSION: 4.5 cm spiculated left upper lobe mass most compatible with primary lung cancer. This abuts the medial pleural surface. A few small adjacent pre-vascular  mediastinal lymph nodes,  none pathologically enlarged. Recommend cardiothoracic surgical and oncologic consultation. No acute findings or evidence of metastatic disease in the abdomen or pelvis. Electronically Signed   By: Rolm Baptise M.D.   On: 04/23/2021 22:38   DG Chest Port 1 View  Result Date: 04/25/2021 CLINICAL DATA:  Chest tube, pneumothorax EXAM: PORTABLE CHEST 1 VIEW COMPARISON:  Chest x-ray 04/24/2021 FINDINGS: Right-sided chest tube in place. Interval expansion of the right lung with persistent small pneumothorax measuring approximately 12 mm from the apex. Stable masslike opacity in the left upper lung zone. Cardiomediastinal silhouette is unchanged. No significant pleural effusion visualized. IMPRESSION: Small right pneumothorax, improved since previous study. Electronically Signed   By: Ofilia Neas M.D.   On: 04/25/2021 08:48   DG CHEST PORT 1 VIEW  Result Date: 04/24/2021 CLINICAL DATA:  Follow-up pneumothorax. EXAM: PORTABLE CHEST 1 VIEW COMPARISON:  1-1/2 hours ago. FINDINGS: Pigtail catheter projects over the right mid lower lung zone. Unchanged size of moderate right pneumothorax. Pleural edge is at least 3 cm from the lung apex. There is increasing opacity in the medial right lung base. No mediastinal shift. Low lung volumes with masslike opacity in the left upper lobe unchanged. Otherwise unchanged exam. IMPRESSION: 1. Unchanged size of moderate right pneumothorax. Right chest tube remains in place. 2. Increasing opacity in the medial right lung base, likely atelectasis. 3. Unchanged masslike opacity in the left upper lobe. Electronically Signed   By: Keith Rake M.D.   On: 04/24/2021 19:23   EEG adult  Result Date: 04/24/2021 Abigail Jack, MD     04/24/2021  2:05 PM Routine EEG Report Abigail Golden is a 55 y.o. female with a history of presyncope who is undergoing an EEG to evaluate for seizures. Report: This EEG was acquired with electrodes placed according to the  International 10-20 electrode system (including Fp1, Fp2, F3, F4, C3, C4, P3, P4, O1, O2, T3, T4, T5, T6, A1, A2, Fz, Cz, Pz). The following electrodes were missing or displaced: none. The occipital dominant rhythm was 9 Hz. This activity is reactive to stimulation. Drowsiness was manifested by background fragmentation; deeper stages of sleep were not identified. There was intermittent focal slowing over the left posterior region. There were no interictal epileptiform discharges. There were no electrographic seizures identified. Photic stimulation and hyperventilation were not performed. Impression and clinical correlation: This EEG was obtained while awake and drowsy and is abnormal due to intermittent focal slowing over the left posterior region, indicative of focal cerebral dysfunction in that area. There were no electrographic seizures observed during this recording. Su Monks, MD [facility] [phone] If 7pm- 7am, please page neurology on call as listed in [place].   DG C-ARM BRONCHOSCOPY  Result Date: 04/24/2021 C-ARM BRONCHOSCOPY: Fluoroscopy was utilized by the requesting physician.  No radiographic interpretation.     DG Chest 1 View  Result Date: 04/24/2021 CLINICAL DATA:  Post bronchoscopy and right chest tube placement. Pneumothorax. EXAM: CHEST  1 VIEW COMPARISON:  Chest CT 04/23/2021 FINDINGS: Left upper lobe mass noted as seen on prior CT. Right chest tube in place. Lucency noted over the right lateral chest and right apex concerning for pneumothorax, but the pleural edge is difficult to visualize on this portable study. Right basilar opacity, likely atelectasis. No visible effusions. IMPRESSION: Right chest tube in place. Lucency over the lateral and upper right hemithorax likely reflects pneumothorax, but pleural edge is difficult to visualize. Left upper lobe mass again noted. Electronically Signed   By: Lennette Bihari  [place] M.D.   On: 04/24/2021 18:06   CT HEAD WO CONTRAST  (5MM)  Result Date: 04/22/2021 CLINICAL DATA:  Worsening headaches.  Dizziness and lightheaded. EXAM: CT HEAD WITHOUT CONTRAST TECHNIQUE: Contiguous axial images were obtained from the base of the skull through the vertex without intravenous contrast. COMPARISON:  Brain MRI 07/15/2018 FINDINGS: Brain: No abnormality seen affecting the brainstem or cerebellum. There are 2 separate areas of vasogenic edema, 1 within the inferior left frontal region in the other at the left parietal vertex. There are presumed underlying masses in both of those areas, though they are not clearly depicted. No evidence of hemorrhage. No left-to-right shift. No abnormality seen in the right hemisphere. No extra-axial fluid collection. The differential diagnosis includes venous infarctions and cerebritis. MRI with contrast strongly recommended. Vascular: No abnormal vascular finding. Skull: Normal Sinuses/Orbits: Clear/normal Other: None IMPRESSION: Two separate areas of vasogenic edema within the left hemisphere, 1 within the inferior frontal region in the other at the left parietal vertex. Metastatic disease would be the most likely cause of this appearance. Other possibilities include venous infarctions and cerebritis. MRI with contrast recommended. Call report in progress. Electronically Signed   By: Nelson Chimes M.D.   On: 04/22/2021 16:07   CT Chest W Contrast  Result Date: 04/23/2021 CLINICAL DATA:  Metastatic disease evaluation. Brain metastases on MRI. EXAM: CT CHEST, ABDOMEN, AND PELVIS WITH CONTRAST TECHNIQUE: Multidetector CT imaging of the chest, abdomen and pelvis was performed following the standard protocol during bolus administration of intravenous contrast. CONTRAST:  46m OMNIPAQUE IOHEXOL 300 MG/ML  SOLN COMPARISON:  05/25/2015 FINDINGS: CT CHEST FINDINGS Cardiovascular: Heart is normal size. Aorta is normal caliber. Mediastinum/Nodes: No mediastinal, hilar, or axillary adenopathy. Trachea and esophagus are  unremarkable. Thyroid unremarkable. Lungs/Pleura: 4.5 cm left upper lobe spiculated mass compatible with primary lung cancer. No pleural effusions. This abuts the medial pleural surface and mediastinum. Few small adjacent pre-vascular lymph nodes, not pathologically enlarged based on CT size criteria. Musculoskeletal: Chest wall soft tissues are unremarkable. No acute bony abnormality. CT ABDOMEN PELVIS FINDINGS Hepatobiliary: Insert paddle biliary Pancreas: No focal abnormality or ductal dilatation. Spleen: No focal abnormality.  Normal size. Adrenals/Urinary Tract: No adrenal abnormality. No focal renal abnormality. No stones or hydronephrosis. Urinary bladder is unremarkable. Stomach/Bowel: Stomach, large and small bowel grossly unremarkable. Vascular/Lymphatic: No evidence of aneurysm or adenopathy. Reproductive: Prior hysterectomy.  No adnexal masses. Other: No free fluid or free air. Musculoskeletal: No acute bony abnormality. IMPRESSION: 4.5 cm spiculated left upper lobe mass most compatible with primary lung cancer. This abuts the medial pleural surface. A few small adjacent pre-vascular mediastinal lymph nodes, none pathologically enlarged. Recommend cardiothoracic surgical and oncologic consultation. No acute findings or evidence of metastatic disease in the abdomen or pelvis. Electronically Signed   By: KRolm BaptiseM.D.   On: 04/23/2021 22:38   MR Brain W and Wo Contrast  Result Date: 04/23/2021 CLINICAL DATA:  55year old female with increasing headaches. Multifocal vasogenic edema in the brain on plain head CT yesterday. EXAM: MRI HEAD WITHOUT AND WITH CONTRAST TECHNIQUE: Multiplanar, multiecho pulse sequences of the brain and surrounding structures were obtained without and with intravenous contrast. CONTRAST:  820mGADAVIST GADOBUTROL 1 MMOL/ML IV SOLN COMPARISON:  Head CT 04/22/2021. Brain MRI without contrast 07/15/2018. FINDINGS: Brain: Solid enhancing 16 mm mass in the posterosuperior left  parietal lobe with moderate regional vasogenic edema (series 18, image 41 and series 20, image 18). Only mild regional mass effect. Large up to 29  mm rim enhancing cystic or necrotic mass in the left hemisphere at the inferior frontal gyrus junction with the temporal lobe (series 18, image 22 and series 19, image 21. Moderate vasogenic edema in the region but only mild regional mass effect - including on the anterior left temporal tip. Punctate 2-3 mm abnormal enhancement in the posterior right temporal lobe seen on both series 20, image 6 and series 19, image 14. Subtle similar right inferior frontal gyrus lesion best seen on series 18, image 24 and series 20, image 13. No associated edema. And suspicion of 2 similar small nodular areas of enhancement in the superior frontal gyri abutting the midline. See series 18, image 46 and series 19, images 19 and 17. Less likely these could be dural based. No edema. No dural thickening or enhancement elsewhere. No superimposed restricted diffusion to suggest acute infarction. No midline shift, ventriculomegaly, extra-axial collection or acute intracranial hemorrhage. Cervicomedullary junction and pituitary are within normal limits. Normal background gray and white matter signal. No cortical encephalomalacia or chronic cerebral blood products. Vascular: Major intracranial vascular flow voids in the are stable since 2020. Dominant left vertebral artery. The major dural venous sinuses are enhancing and appear to be patent. Skull and upper cervical spine: Negative. Visualized bone marrow signal is within normal limits. Sinuses/Orbits: Negative. Other: Trace right mastoid fluid. Negative nasopharynx. Negative visible scalp and face. IMPRESSION: 1. Metastatic disease to the brain. Two enhancing brain masses with moderate associated vasogenic edema: solid 16 mm left parietal lobe mass, and a larger 29 mm rim enhancing cystic or necrotic mass in the left inferior frontal gyrus. Two  other small 2-3 mm metastases in the right inferior frontal gyrus, right posterior temporal lobe. And questionable two additional punctate metastases in both superior frontal gyri abutting the falx (series 18, image 46). 2. No significant intracranial mass effect. No other intracranial abnormality. Electronically Signed   By: Genevie Ann M.D.   On: 04/23/2021 06:41   CT Abdomen Pelvis W Contrast  Result Date: 04/23/2021 CLINICAL DATA:  Metastatic disease evaluation. Brain metastases on MRI. EXAM: CT CHEST, ABDOMEN, AND PELVIS WITH CONTRAST TECHNIQUE: Multidetector CT imaging of the chest, abdomen and pelvis was performed following the standard protocol during bolus administration of intravenous contrast. CONTRAST:  37m OMNIPAQUE IOHEXOL 300 MG/ML  SOLN COMPARISON:  05/25/2015 FINDINGS: CT CHEST FINDINGS Cardiovascular: Heart is normal size. Aorta is normal caliber. Mediastinum/Nodes: No mediastinal, hilar, or axillary adenopathy. Trachea and esophagus are unremarkable. Thyroid unremarkable. Lungs/Pleura: 4.5 cm left upper lobe spiculated mass compatible with primary lung cancer. No pleural effusions. This abuts the medial pleural surface and mediastinum. Few small adjacent pre-vascular lymph nodes, not pathologically enlarged based on CT size criteria. Musculoskeletal: Chest wall soft tissues are unremarkable. No acute bony abnormality. CT ABDOMEN PELVIS FINDINGS Hepatobiliary: Insert paddle biliary Pancreas: No focal abnormality or ductal dilatation. Spleen: No focal abnormality.  Normal size. Adrenals/Urinary Tract: No adrenal abnormality. No focal renal abnormality. No stones or hydronephrosis. Urinary bladder is unremarkable. Stomach/Bowel: Stomach, large and small bowel grossly unremarkable. Vascular/Lymphatic: No evidence of aneurysm or adenopathy. Reproductive: Prior hysterectomy.  No adnexal masses. Other: No free fluid or free air. Musculoskeletal: No acute bony abnormality. IMPRESSION: 4.5 cm spiculated  left upper lobe mass most compatible with primary lung cancer. This abuts the medial pleural surface. A few small adjacent pre-vascular mediastinal lymph nodes, none pathologically enlarged. Recommend cardiothoracic surgical and oncologic consultation. No acute findings or evidence of metastatic disease in the abdomen or pelvis. Electronically  Signed   By: Rolm Baptise M.D.   On: 04/23/2021 22:38   DG Chest Port 1 View  Result Date: 04/25/2021 CLINICAL DATA:  Chest tube, pneumothorax EXAM: PORTABLE CHEST 1 VIEW COMPARISON:  Chest x-ray 04/24/2021 FINDINGS: Right-sided chest tube in place. Interval expansion of the right lung with persistent small pneumothorax measuring approximately 12 mm from the apex. Stable masslike opacity in the left upper lung zone. Cardiomediastinal silhouette is unchanged. No significant pleural effusion visualized. IMPRESSION: Small right pneumothorax, improved since previous study. Electronically Signed   By: Ofilia Neas M.D.   On: 04/25/2021 08:48   DG CHEST PORT 1 VIEW  Result Date: 04/24/2021 CLINICAL DATA:  Follow-up pneumothorax. EXAM: PORTABLE CHEST 1 VIEW COMPARISON:  1-1/2 hours ago. FINDINGS: Pigtail catheter projects over the right mid lower lung zone. Unchanged size of moderate right pneumothorax. Pleural edge is at least 3 cm from the lung apex. There is increasing opacity in the medial right lung base. No mediastinal shift. Low lung volumes with masslike opacity in the left upper lobe unchanged. Otherwise unchanged exam. IMPRESSION: 1. Unchanged size of moderate right pneumothorax. Right chest tube remains in place. 2. Increasing opacity in the medial right lung base, likely atelectasis. 3. Unchanged masslike opacity in the left upper lobe. Electronically Signed   By: Keith Rake M.D.   On: 04/24/2021 19:23   EEG adult  Result Date: 04/24/2021 Abigail Jack, MD     04/24/2021  2:05 PM Routine EEG Report REVE CROCKET is a 55 y.o. female with a history  of presyncope who is undergoing an EEG to evaluate for seizures. Report: This EEG was acquired with electrodes placed according to the International 10-20 electrode system (including Fp1, Fp2, F3, F4, C3, C4, P3, P4, O1, O2, T3, T4, T5, T6, A1, A2, Fz, Cz, Pz). The following electrodes were missing or displaced: none. The occipital dominant rhythm was 9 Hz. This activity is reactive to stimulation. Drowsiness was manifested by background fragmentation; deeper stages of sleep were not identified. There was intermittent focal slowing over the left posterior region. There were no interictal epileptiform discharges. There were no electrographic seizures identified. Photic stimulation and hyperventilation were not performed. Impression and clinical correlation: This EEG was obtained while awake and drowsy and is abnormal due to intermittent focal slowing over the left posterior region, indicative of focal cerebral dysfunction in that area. There were no electrographic seizures observed during this recording. Su Monks, MD [facility] [phone] If 7pm- 7am, please page neurology on call as listed in [place].   DG C-ARM BRONCHOSCOPY  Result Date: 04/24/2021 C-ARM BRONCHOSCOPY: Fluoroscopy was utilized by the requesting physician.  No radiographic interpretation.    Assessment and Plan:  1.  Left upper lobe lung mass concerning for primary lung cancer 2.  Brain lesions concerning for brain metastasis 3.  Right pneumothorax 4.  Migraine headaches 5.  Allergies 6.  Morbid obesity  -Discussed imaging findings with the patient and her friends.  We discussed that findings are concerning for probable lung cancer with metastatic disease to the brain.  However, will await final pathology. -If patient found to have non-small cell lung cancer, will send for additional studies such as PD-L1 and molecular studies. -We discussed that systemic treatment would depend on final pathology results and will  have a more detailed discussion with the patient once we have more information. -Continue dexamethasone.  Recommend radiation oncology consultation for recommendations regarding brain metastases.  Thank you for this referral.  Mikey Bussing, DNP, AGPCNP-BC, AOCNP   Addendum  I have seen the patient, examined her. I agree with the assessment and and plan and have edited the notes.   55 yo female who has no smoking history presented with symptomatic brain metastasis.  Brain MRI showed 2 enhancing lesions, measuring 1.6 cm and 2.9 cm in left brain and 2 other small 2 to 3 mm metastasis in the right frontal gyrus. CT chest showed a 4.5 cm mass in the left upper lobe, no definitive other nodes or distant metastasis.  Images consistent with metastatic lung cancer to brain, given her no smoking history, this is likely adenocarcinoma.  Bronchoscopy biopsy results still pending.  Hopefully we have enough tissue for PD-L1 and FO.  If not, I will try guardant 360.  Patient with need treatment for brain metastasis first, likely radiation therapy given the multifocal metastasis. She was seen by neurosurgeon Dr. Reatha Armour, who did not recommend surgery.She may have more small metastasis if we do brain MRI with SBRT protocol. Will send a message to RAD/ONC for their evaluation.   I reviewed systemic treatment options for metastatic lung cancer, including chemotherapy, immunotherapy and targeted therapy, depends on the further molecular testing results.  I will f/u as needed before discharge, and set up her f/u with me in office. All questions were answered.   Truitt Merle  04/25/2021

## 2021-04-25 NOTE — Consult Note (Signed)
   Providing Compassionate, Quality Care - Together  Neurosurgery Consult  Referring physician:  Dr. Waldron Labs Reason for referral: Intracranial masses  Chief Complaint: Headaches  History of Present Illness: This is a pleasant right-handed 55 year old woman with a history of pediatric migraines, that have extended into her adulthood, the complaint of worsening headaches since the middle of last month.  She had multiple episodes of severe headaches with some intermittent dizziness and therefore she went to her primary care physician office.  She was sent for CT of the brain which showed multiple intracranial masses and therefore was sent to the emergency department.  She denies any blurry vision, weakness, numbness, tingling.  She denies any recent travels.  Denies any fevers.  Denies any history of smoking or drinking.  Denies any family history of cancer.  Denies any seizure activity  History reviewed. No pertinent past medical history. History reviewed. No pertinent surgical history.  Medications: I have reviewed the patient's current medications. Allergies: No Known Allergies  History reviewed. No pertinent family history. Social History:  has no history on file for tobacco use, alcohol use, and drug use.  ROS: All positives and negatives are listed in HPI above  Physical Exam:  Vital signs in last 24 hours: Temp:  [98 F (36.7 C)-98.3 F (36.8 C)] 98 F (36.7 C) (07/25 1814) Pulse Rate:  [58-128] 65 (07/26 0746) Resp:  [11-18] 14 (07/26 0217) BP: (138-182)/(65-125) 153/88 (07/26 0700) SpO2:  [91 %-98 %] 96 % (07/26 0746) PE: Awake alert oriented x3 PERRLA EOMI Cranial nerves II through XII intact Speech fluent and appropriate Appropriate affect Bilateral upper/lower extremity 5/5 Sensory intact light touch No dysmetria   Impression/Assessment:  55 year old female with  Multiple intracranial masses, concerning for metastatic disease Lung lesion   Plan:   -Continue Decadron, would maintain on a low-dose ultimately of 2 mg twice daily -Imaging reviewed.  Given location and less than 3 cm tumors, ability of lung biopsy was performed yesterday, will await pathology for further recommendation.  Patient will likely need radiation treatment for her multiple intracranial masses. -We will continue to follow -Answered all of her questions.   Thank you for allowing me to participate in this patient's care.  Please do not hesitate to call with questions or concerns.   Elwin Sleight, [facility] Cell: [phone]

## 2021-04-25 NOTE — Plan of Care (Signed)
  Problem: Education: Goal: Knowledge of General Education information will improve Description: Including pain rating scale, medication(s)/side effects and non-pharmacologic comfort measures Outcome: Progressing   Problem: Clinical Measurements: Goal: Will remain free from infection Outcome: Progressing Goal: Diagnostic test results will improve Outcome: Progressing Goal: Respiratory complications will improve Outcome: Progressing Goal: Cardiovascular complication will be avoided Outcome: Progressing   Problem: Coping: Goal: Level of anxiety will decrease Outcome: Progressing   Problem: Pain Managment: Goal: General experience of comfort will improve Outcome: Progressing   Problem: Safety: Goal: Ability to remain free from injury will improve Outcome: Progressing   Problem: Clinical Measurements: Goal: Ability to maintain clinical measurements within normal limits will improve Outcome: Not Progressing   Problem: Activity: Goal: Risk for activity intolerance will decrease Outcome: Not Progressing   Problem: Nutrition: Goal: Adequate nutrition will be maintained Outcome: Not Progressing   Problem: Elimination: Goal: Will not experience complications related to bowel motility Outcome: Not Progressing Goal: Will not experience complications related to urinary retention Outcome: Not Progressing

## 2021-04-25 NOTE — Progress Notes (Addendum)
   NAME:  ADIBA FARGNOLI, MRN:  [id number], DOB:  1965/12/10, LOS: 1 ADMISSION DATE:  04/23/2021, CONSULTATION DATE:  04/24/21 REFERRING MD:  Candiss Norse, CHIEF COMPLAINT:  headaches   History of Present Illness:  55 year old never smoker w/ hx of seasonal allergies, allergic bronchitis, childhood migraines presenting with 3-4 weeks of recurrent dizziness episodes associated with neuralgia-type pain across back of head.  Workup has revealed two brain metastases as well as a large LUL mass for which PCCM consulted.  No family hx of early cancers, no unusual exposures. Has hx of hysterectomy for fibroids.  Denies SOB, hemoptysis.  Has postnasal drip and dry cough which is chronic and associated with her seasonal allergies.  She has no focal weakness, trouble speaking, shaking or seizure-like events.  There is a question of if she is losing consciousness with these dizzy spells.  Dizzy spells are worse with exertion.  Works for school system.  Pertinent  Medical History   Past Medical History:  Diagnosis Date   Allergy    Anxiety    Chronic bronchitis ([place])    Depression    Headache    Hypoglycemia    Lower back pain    since fall ~ 2012 (05/25/2015)   Migraine    05/25/2015 "probably once/month"   Migraine headache    Pain in left hip    since fall ~ 2012 (05/25/2015)   Pneumonia "several times"     Significant Hospital Events: Including procedures, antibiotic start and stop dates in addition to other pertinent events   11/1-2 admitted, bronch, PTX, chest tube 04/26/2021 right chest tube still with airleak on waterseal  Interim History / Subjective:  Chest tube still with airleak  Objective   Blood pressure 115/64, pulse 75, temperature 98.1 F (36.7 C), temperature source Oral, resp. rate 20, height 5' (1.524 m), weight 127 kg, SpO2 95 %.        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  Nonsmoker raises question of eGFR positivity.  Dr. Valeta Harms will fit in procedure today.  Will need close heme/onc and med/onc f/u. Presyncope r/o complex seizures Iatrogenic R PTX- due to single lung bagging during procedure yesterday  Continue Decadron Chest tube is been to waterseal for 24 hours we will check chest x-ray to ensure pneumothorax is not increasing in size Pain control as needed Encourage pulmonary toilet Surgery input appreciated Medical oncology consulted patient 04/25/2021 Will need radiation oncology consult also.      Best Practice (right click and "Reselect all SmartList Selections" daily)   Per primary  Richardson Landry Darrious Youman ACNP Acute Care Nurse Practitioner [place] Please consult Amion 04/26/2021, 9:33 AM

## 2021-04-25 NOTE — Evaluation (Signed)
Occupational Therapy Evaluation Patient Details Name: Abigail Golden MRN: [id number] DOB: June 24, 1965 Today's Date: 04/25/2021   History of Present Illness Pt is a 55 y/o female admitted 11/1 secondary to headache and dizziness.Imaging revealed L parietal and frontal mass. Also found to have mass in L upper lobe of lung. Workup pending. PMH includes migraines.   Clinical Impression   Patient admitted for the above diagnosis.  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  Post acute she plan on staying with some friends, and will have assist as needed.  Deficits are listed below.  OT to follow in the acute setting, but no post acute OT is anticipated.        Recommendations for follow up therapy are one component of a multi-disciplinary discharge planning process, led by the attending physician.  Recommendations may be updated based on patient status, additional functional criteria and insurance authorization.   Follow Up Recommendations  No OT follow up    Assistance Recommended at Discharge PRN  Functional Status Assessment     Equipment Recommendations  Tub/shower bench    Recommendations for Other Services       Precautions / Restrictions Precautions Precautions: None Restrictions Weight Bearing Restrictions: No      Mobility Bed Mobility Overal bed mobility: Needs Assistance Bed Mobility: Supine to Sit;Sit to Supine     Supine to sit: Min guard;HOB elevated;Min assist Sit to supine: Min guard;HOB elevated   General bed mobility comments: increased time and cues to minimize discomfort and manage chest tube    Transfers Overall transfer level: Needs assistance   Transfers: Sit to/from Stand;Stand Pivot Transfers Sit to Stand: Supervision Stand pivot transfers: Supervision                Balance Overall balance assessment: Mild deficits observed, not formally tested                                         ADL either  performed or assessed with clinical judgement   ADL       Grooming: Wash/dry hands;Wash/dry face;Set up;Sitting       Lower Body Bathing: Supervison/ safety;Sitting/lateral leans       Lower Body Dressing: Supervision/safety;Sitting/lateral leans   Toilet Transfer: Supervision/safety;BSC;Stand-pivot   Toileting- Clothing Manipulation and Hygiene: Supervision/safety;Sitting/lateral lean               Vision Patient Visual Report: No change from baseline       Perception Perception Perception: Within Functional Limits   Praxis Praxis Praxis: Intact    Pertinent Vitals/Pain Pain Assessment: Faces Faces Pain Scale: Hurts little more Pain Location: chest tube site Pain Descriptors / Indicators: Tender Pain Intervention(s): Monitored during session     Hand Dominance Right   Extremity/Trunk Assessment Upper Extremity Assessment Upper Extremity Assessment: Overall WFL for tasks assessed   Lower Extremity Assessment Lower Extremity Assessment: Overall WFL for tasks assessed   Cervical / Trunk Assessment Cervical / Trunk Assessment: Normal   Communication Communication Communication: No difficulties   Cognition Arousal/Alertness: Awake/alert Behavior During Therapy: WFL for tasks assessed/performed Overall Cognitive Status: Within Functional Limits for tasks assessed                                 General Comments: Initial screen for cognition is  normal.  No deficts noted.  Will continue to monitor.     General Comments   VSS    Exercises     Shoulder Instructions      Home Living Family/patient expects to be discharged to:: Private residence Living Arrangements: Alone Available Help at Discharge: Friend(s);Available PRN/intermittently Type of Home: House Home Access: Stairs to enter [employer] of Steps: 2 Entrance Stairs-Rails: Right Home Layout: One level     Bathroom Shower/Tub: Tub/shower unit;Walk-in shower    Bathroom Toilet: Handicapped height Bathroom Accessibility: Yes How Accessible: Accessible via walker Home Equipment: None          Prior Functioning/Environment Prior Level of Function : Independent/Modified Independent;Working/employed             Mobility Comments: Independent; works as a Control and instrumentation engineer ADLs Comments: No assist with ADL/IADL        OT Problem List: Decreased activity tolerance;Impaired balance (sitting and/or standing)      OT Treatment/Interventions: Self-care/ADL training;Therapeutic activities;Balance training;Patient/family education;Cognitive remediation/compensation    OT Goals(Current goals can be found in the care plan section) Acute Rehab OT Goals Patient Stated Goal: Return home and eventually get back to my class OT Goal Formulation: With patient Time For Goal Achievement: 05/09/21 Potential to Achieve Goals: Good ADL Goals Pt Will Perform Grooming: with modified independence;standing Pt Will Perform Lower Body Bathing: with modified independence;sit to/from stand Pt Will Perform Lower Body Dressing: with modified independence;sit to/from stand Pt Will Transfer to Toilet: with modified independence;ambulating;regular height toilet Pt Will Perform Toileting - Clothing Manipulation and hygiene: with modified independence;sit to/from stand  OT Frequency: Min 2X/week   Barriers to D/C:    None noted       Co-evaluation              AM-PAC OT "6 Clicks" Daily Activity     Outcome Measure Help from another person eating meals?: None Help from another person taking care of personal grooming?: None Help from another person toileting, which includes using toliet, bedpan, or urinal?: A Little Help from another person bathing (including washing, rinsing, drying)?: A Little Help from another person to put on and taking off regular upper body clothing?: A Little Help from another person to put on and taking off regular lower body  clothing?: A Little 6 Click Score: 20   End of Session Equipment Utilized During Treatment: Oxygen  Activity Tolerance: Treatment limited secondary to medical complications (Comment) Patient left: in bed;with call bell/phone within reach;with bed alarm set  OT Visit Diagnosis: Unsteadiness on feet (R26.81)                Time: 1329-1350 OT Time Calculation (min): 21 min Charges:  OT General Charges $OT Visit: 1 Visit OT Evaluation $OT Eval Moderate Complexity: 1 Mod  04/25/2021  RP, OTR/L  Acute Rehabilitation Services  Office:  [phone]   Metta Clines 04/25/2021, 1:57 PM

## 2021-04-25 NOTE — Plan of Care (Signed)

## 2021-04-26 ENCOUNTER — Inpatient Hospital Stay (HOSPITAL_COMMUNITY): Payer: BC Managed Care – PPO

## 2021-04-26 ENCOUNTER — Other Ambulatory Visit: Payer: Self-pay | Admitting: Radiation Therapy

## 2021-04-26 ENCOUNTER — Ambulatory Visit
Admission: RE | Admit: 2021-04-26 | Discharge: 2021-04-26 | Disposition: A | Discharge status: Home / Self Care | Payer: BC Managed Care – PPO | Source: Ambulatory Visit | Attending: Radiation Oncology | Admitting: Radiation Oncology

## 2021-04-26 DIAGNOSIS — G936 Cerebral edema: Secondary | ICD-10-CM | POA: Diagnosis not present

## 2021-04-26 DIAGNOSIS — Z510 Encounter for antineoplastic radiation therapy: Secondary | ICD-10-CM | POA: Insufficient documentation

## 2021-04-26 DIAGNOSIS — C7931 Secondary malignant neoplasm of brain: Secondary | ICD-10-CM | POA: Diagnosis not present

## 2021-04-26 DIAGNOSIS — J95811 Postprocedural pneumothorax: Secondary | ICD-10-CM

## 2021-04-26 DIAGNOSIS — C3412 Malignant neoplasm of upper lobe, left bronchus or lung: Secondary | ICD-10-CM | POA: Insufficient documentation

## 2021-04-26 LAB — CBC WITH DIFFERENTIAL/PLATELET
Abs Immature Granulocytes: 0.05 10*3/uL (ref 0.00–0.07)
Basophils Absolute: 0 10*3/uL (ref 0.0–0.1)
Basophils Relative: 0 %
Eosinophils Absolute: 0 10*3/uL (ref 0.0–0.5)
Eosinophils Relative: 0 %
HCT: 33.7 % — ABNORMAL LOW (ref 36.0–46.0)
Hemoglobin: 11.3 g/dL — ABNORMAL LOW (ref 12.0–15.0)
Immature Granulocytes: 1 %
Lymphocytes Relative: 14 %
Lymphs Abs: 1 10*3/uL (ref 0.7–4.0)
MCH: 29.2 pg (ref 26.0–34.0)
MCHC: 33.5 g/dL (ref 30.0–36.0)
MCV: 87.1 fL (ref 80.0–100.0)
Monocytes Absolute: 0.5 10*3/uL (ref 0.1–1.0)
Monocytes Relative: 6 %
Neutro Abs: 6 10*3/uL (ref 1.7–7.7)
Neutrophils Relative %: 79 %
Platelets: 210 10*3/uL (ref 150–400)
RBC: 3.87 MIL/uL (ref 3.87–5.11)
RDW: 12.8 % (ref 11.5–15.5)
WBC: 7.5 10*3/uL (ref 4.0–10.5)
nRBC: 0 % (ref 0.0–0.2)

## 2021-04-26 LAB — COMPREHENSIVE METABOLIC PANEL
ALT: 14 U/L (ref 0–44)
AST: 15 U/L (ref 15–41)
Albumin: 3.2 g/dL — ABNORMAL LOW (ref 3.5–5.0)
Alkaline Phosphatase: 50 U/L (ref 38–126)
Anion gap: 7 (ref 5–15)
BUN: 16 mg/dL (ref 6–20)
CO2: 24 mmol/L (ref 22–32)
Calcium: 8.8 mg/dL — ABNORMAL LOW (ref 8.9–10.3)
Chloride: 104 mmol/L (ref 98–111)
Creatinine, Ser: 0.9 mg/dL (ref 0.44–1.00)
GFR, Estimated: >60 mL/min (ref >60)
Glucose, Bld: 161 mg/dL — ABNORMAL HIGH (ref 70–99)
Potassium: 4.4 mmol/L (ref 3.5–5.1)
Sodium: 135 mmol/L (ref 135–145)
Total Bilirubin: 0.7 mg/dL (ref 0.3–1.2)
Total Protein: 6.4 g/dL — ABNORMAL LOW (ref 6.5–8.1)

## 2021-04-26 LAB — MAGNESIUM: Magnesium: 2.2 mg/dL (ref 1.7–2.4)

## 2021-04-26 LAB — GLUCOSE, CAPILLARY
Glucose-Capillary: 150 mg/dL — ABNORMAL HIGH (ref 70–99)
Glucose-Capillary: 152 mg/dL — ABNORMAL HIGH (ref 70–99)
Glucose-Capillary: 156 mg/dL — ABNORMAL HIGH (ref 70–99)
Glucose-Capillary: 176 mg/dL — ABNORMAL HIGH (ref 70–99)

## 2021-04-26 LAB — BRAIN NATRIURETIC PEPTIDE: B Natriuretic Peptide: 57.5 pg/mL (ref 0.0–100.0)

## 2021-04-26 MED ORDER — GADOBUTROL 1 MMOL/ML IV SOLN
10.0000 mL | Freq: Once | INTRAVENOUS | Status: AC | PRN
  Administered 2021-04-26: 10 mL via INTRAVENOUS

## 2021-04-26 NOTE — Progress Notes (Signed)
PROGRESS NOTE                                                                                                                                                                                                             Patient Demographics:    Abigail Golden, is a 55 y.o. female, DOB - 12/08/1965, CBS:[id number]  Outpatient Primary MD for the patient is Aletha Halim., PA-C    LOS - 2  Admit date - 04/23/2021    No chief complaint on file.      Brief Narrative (HPI from H&P)    Abigail Golden is a 55 y.o. female with history of migraine and allergies presents to the ER after outpatient CT scan showing a mass, MRI brain here confirmed brain mass along further work-up also suggests primary lung cancer with mets to the brain.  She was admitted for further work-up.  She had a biopsy of left upper lobe mass via bronchoscopy 04/24/2021, she has right lung Pneumothorax after procedure, where she required chest tube.   Subjective:    Abigail Golden today reports pain at the insertion site is controlled with current regimen, she denies any fever, chills, any new focal deficits.     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 -work-up suspicious for primary metastatic lung cancer, initial biopsy results none small cell carcinoma (likely adenocarcinoma as she is non-smoker) . -Continue with IV steroids for brain mets. -Neurosurgery consulted, most likely will need radiation. -Oncology input greatly appreciated.      Iatrogenic right pneumothorax -Management per PCCM -Chest tube care per PCCM. -Pain is controlled on current regimen.   Morbid obesity with BMI of 50.  Follow with PCP.  History of allergies and migraines.  Supportive care.      Condition - Extremely Guarded  Family Communication  :  none at  bedside  Code Status :  Full  Consults  : PCCM, Oncology,neurosurgery   Procedures:  -Chest tube/bronchoscopy by [facility] 04/24/2021    Procedures  :     CT Chest - Abd and Pelvis - 4.5 cm spiculated left upper lobe mass most compatible with primary lung cancer. This abuts the medial pleural surface. A few small adjacent pre-vascular  mediastinal lymph nodes, none pathologically enlarged. Recommend cardiothoracic surgical and oncologic consultation. No acute findings or evidence of metastatic disease in the abdomen or pelvis  MRI Brain  - 1. Metastatic disease to the brain. Two enhancing brain masses with moderate associated vasogenic edema: solid 16 mm left parietal lobe mass, and a larger 29 mm rim enhancing cystic or necrotic mass in the left inferior frontal gyrus. Two other small 2-3 mm metastases in the right inferior frontal gyrus, right posterior temporal lobe. And questionable two additional punctate metastases in both superior frontal gyri abutting the falx (series 18, image 46). 2. No significant intracranial mass effect. No other intracranial abnormality      Disposition Plan  :    Status is: inp  DVT Prophylaxis  :    heparin injection 5,000 Units Start: 04/25/21 1400 Place and maintain sequential compression device Start: 04/24/21 0755  Lab Results  Component Value Date   PLT 210 04/26/2021    Diet :  Diet Order             Diet heart healthy/carb modified Room service appropriate? Yes; Fluid consistency: Thin  Diet effective now                    Inpatient Medications  Scheduled Meds:  citalopram  10 mg Oral QHS   darifenacin  7.5 mg Oral QHS   dexamethasone (DECADRON) injection  4 mg Intravenous Q6H   diphenhydrAMINE  25 mg Oral QHS   heparin injection (subcutaneous)  5,000 Units Subcutaneous Q8H   hydrOXYzine  25 mg Oral QHS   insulin aspart  0-5 Units Subcutaneous QHS   insulin aspart  0-9 Units Subcutaneous TID WC   loratadine  10 mg Oral Daily    montelukast  10 mg Oral QHS   sodium chloride flush  10 mL Other Q8H   Continuous Infusions:  lactated ringers 10 mL/hr at 04/24/21 1357   PRN Meds:.acetaminophen **OR** acetaminophen, albuterol, ALPRAZolam, diphenhydrAMINE, hydrOXYzine, morphine injection, oxyCODONE-acetaminophen  Antibiotics  :    Anti-infectives (From admission, onward)    None         Phillips Climes M.D on 04/26/2021 at 10:01 AM  To page go to [email or web address]   [facility] -  Office  [phone]  See all Orders from today for further details    Objective:   Vitals:   04/25/21 0742 04/25/21 1440 04/25/21 2100 04/26/21 0629  BP: 115/64 135/64 133/65 118/65  Pulse: 75 80 65 62  Resp: 20 16 14 18   Temp: 98.1 F (36.7 C) 97.6 F (36.4 C) 98.1 F (36.7 C) 97.6 F (36.4 C)  TempSrc: Oral  Oral Oral  SpO2: 95% 97% 94% 95%  Weight:      Height:        Wt Readings from Last 3 Encounters:  04/24/21 127 kg  09/08/18 120.2 kg  02/27/17 122.5 kg     Intake/Output Summary (Last 24 hours) at 04/26/2021 1001 Last data filed at 04/25/2021 1707 Gross per 24 hour  Intake 238 ml  Output 20 ml  Net 218 ml     Physical Exam Awake Alert, Oriented X 3, No new F.N deficits, Normal affect Symmetrical Chest wall movement, Good air movement bilaterally, CTAB RRR,No Gallops,Rubs or new Murmurs, No Parasternal Heave +ve B.Sounds, Abd Soft, No tenderness, No rebound - guarding or rigidity. No Cyanosis, Clubbing or edema, No new Rash or bruise     Data Review:    CBC Recent Labs  Lab 04/22/21 2042 04/24/21 0520 04/25/21 0316 04/26/21 0306  WBC 5.5 4.6 10.7* 7.5  HGB 12.7 13.0 12.7 11.3*  HCT 38.5 37.8 37.4 33.7*  PLT 236 229 227 210  MCV 87.5 86.9 86.0 87.1  MCH 28.9 29.9 29.2 29.2  MCHC 33.0 34.4 34.0 33.5  RDW 12.6 12.6 12.7 12.8  LYMPHSABS 2.8 1.2 1.1 1.0  MONOABS 0.5 0.1 0.9 0.5  EOSABS 0.0 0.0 0.0 0.0  BASOSABS 0.0 0.0 0.0 0.0    Recent Labs  Lab 04/22/21 2042 04/24/21 0520  04/24/21 0802 04/24/21 2212 04/25/21 0316 04/26/21 0306  NA 137 133*  --   --  135 135  K 3.7 4.3  --   --  4.2 4.4  CL 104 104  --   --  104 104  CO2 26 18*  --   --  23 24  GLUCOSE 104* 166*  --   --  173* 161*  BUN 11 10  --   --  15 16  CREATININE 0.94 0.77  --   --  1.19* 0.90  CALCIUM 9.0 8.9  --   --  8.9 8.8*  AST 16 17  --   --  19 15  ALT 13 15  --   --  16 14  ALKPHOS 55 56  --   --  56 50  BILITOT 0.7 0.7  --   --  0.4 0.7  ALBUMIN 3.6 3.5  --   --  3.8 3.2*  MG  --   --   --   --  1.9 2.2  INR  --   --  1.1  --   --   --   HGBA1C  --   --   --  5.2  --   --   BNP  --   --   --   --  38.5 57.5    ------------------------------------------------------------------------------------------------------------------ No results for input(s): CHOL, HDL, LDLCALC, TRIG, CHOLHDL, LDLDIRECT in the last 72 hours.  Lab Results  Component Value Date   HGBA1C 5.2 04/24/2021   ------------------------------------------------------------------------------------------------------------------ No results for input(s): TSH, T4TOTAL, T3FREE, THYROIDAB in the last 72 hours.  Invalid input(s): FREET3  Cardiac Enzymes No results for input(s): CKMB, TROPONINI, MYOGLOBIN in the last 168 hours.  Invalid input(s): CK ------------------------------------------------------------------------------------------------------------------    Component Value Date/Time   BNP 57.5 04/26/2021 0306     Radiology Reports DG Chest 1 View  Result Date: 04/24/2021 CLINICAL DATA:  Post bronchoscopy and right chest tube placement. Pneumothorax. EXAM: CHEST  1 VIEW COMPARISON:  Chest CT 04/23/2021 FINDINGS: Left upper lobe mass noted as seen on prior CT. Right chest tube in place. Lucency noted over the right lateral chest and right apex concerning for pneumothorax, but the pleural edge is difficult to visualize on this portable study. Right basilar opacity, likely atelectasis. No visible effusions.  IMPRESSION: Right chest tube in place. Lucency over the lateral and upper right hemithorax likely reflects pneumothorax, but pleural edge is difficult to visualize. Left upper lobe mass again noted. Electronically Signed   By: Rolm Baptise M.D.   On: 04/24/2021 18:06   CT HEAD WO CONTRAST (5MM)  Result Date: 04/22/2021 CLINICAL DATA:  Worsening headaches.  Dizziness and lightheaded. EXAM: CT HEAD WITHOUT CONTRAST TECHNIQUE: Contiguous axial images were obtained from the base of the skull through the vertex without intravenous contrast. COMPARISON:  Brain MRI 07/15/2018 FINDINGS: Brain: No abnormality seen affecting the brainstem or cerebellum. There are 2 separate areas  of vasogenic edema, 1 within the inferior left frontal region in the other at the left parietal vertex. There are presumed underlying masses in both of those areas, though they are not clearly depicted. No evidence of hemorrhage. No left-to-right shift. No abnormality seen in the right hemisphere. No extra-axial fluid collection. The differential diagnosis includes venous infarctions and cerebritis. MRI with contrast strongly recommended. Vascular: No abnormal vascular finding. Skull: Normal Sinuses/Orbits: Clear/normal Other: None IMPRESSION: Two separate areas of vasogenic edema within the left hemisphere, 1 within the inferior frontal region in the other at the left parietal vertex. Metastatic disease would be the most likely cause of this appearance. Other possibilities include venous infarctions and cerebritis. MRI with contrast recommended. Call report in progress. Electronically Signed   By: Nelson Chimes M.D.   On: 04/22/2021 16:07   CT Chest W Contrast  Result Date: 04/23/2021 CLINICAL DATA:  Metastatic disease evaluation. Brain metastases on MRI. EXAM: CT CHEST, ABDOMEN, AND PELVIS WITH CONTRAST TECHNIQUE: Multidetector CT imaging of the chest, abdomen and pelvis was performed following the standard protocol during bolus  administration of intravenous contrast. CONTRAST:  76mL OMNIPAQUE IOHEXOL 300 MG/ML  SOLN COMPARISON:  05/25/2015 FINDINGS: CT CHEST FINDINGS Cardiovascular: Heart is normal size. Aorta is normal caliber. Mediastinum/Nodes: No mediastinal, hilar, or axillary adenopathy. Trachea and esophagus are unremarkable. Thyroid unremarkable. Lungs/Pleura: 4.5 cm left upper lobe spiculated mass compatible with primary lung cancer. No pleural effusions. This abuts the medial pleural surface and mediastinum. Few small adjacent pre-vascular lymph nodes, not pathologically enlarged based on CT size criteria. Musculoskeletal: Chest wall soft tissues are unremarkable. No acute bony abnormality. CT ABDOMEN PELVIS FINDINGS Hepatobiliary: Insert paddle biliary Pancreas: No focal abnormality or ductal dilatation. Spleen: No focal abnormality.  Normal size. Adrenals/Urinary Tract: No adrenal abnormality. No focal renal abnormality. No stones or hydronephrosis. Urinary bladder is unremarkable. Stomach/Bowel: Stomach, large and small bowel grossly unremarkable. Vascular/Lymphatic: No evidence of aneurysm or adenopathy. Reproductive: Prior hysterectomy.  No adnexal masses. Other: No free fluid or free air. Musculoskeletal: No acute bony abnormality. IMPRESSION: 4.5 cm spiculated left upper lobe mass most compatible with primary lung cancer. This abuts the medial pleural surface. A few small adjacent pre-vascular mediastinal lymph nodes, none pathologically enlarged. Recommend cardiothoracic surgical and oncologic consultation. No acute findings or evidence of metastatic disease in the abdomen or pelvis. Electronically Signed   By: Rolm Baptise M.D.   On: 04/23/2021 22:38   MR Brain W and Wo Contrast  Result Date: 04/23/2021 CLINICAL DATA:  55 year old female with increasing headaches. Multifocal vasogenic edema in the brain on plain head CT yesterday. EXAM: MRI HEAD WITHOUT AND WITH CONTRAST TECHNIQUE: Multiplanar, multiecho pulse  sequences of the brain and surrounding structures were obtained without and with intravenous contrast. CONTRAST:  41mL GADAVIST GADOBUTROL 1 MMOL/ML IV SOLN COMPARISON:  Head CT 04/22/2021. Brain MRI without contrast 07/15/2018. FINDINGS: Brain: Solid enhancing 16 mm mass in the posterosuperior left parietal lobe with moderate regional vasogenic edema (series 18, image 41 and series 20, image 18). Only mild regional mass effect. Large up to 29 mm rim enhancing cystic or necrotic mass in the left hemisphere at the inferior frontal gyrus junction with the temporal lobe (series 18, image 22 and series 19, image 21. Moderate vasogenic edema in the region but only mild regional mass effect - including on the anterior left temporal tip. Punctate 2-3 mm abnormal enhancement in the posterior right temporal lobe seen on both series 20, image 6 and series 19,  image 14. Subtle similar right inferior frontal gyrus lesion best seen on series 18, image 24 and series 20, image 13. No associated edema. And suspicion of 2 similar small nodular areas of enhancement in the superior frontal gyri abutting the midline. See series 18, image 46 and series 19, images 19 and 17. Less likely these could be dural based. No edema. No dural thickening or enhancement elsewhere. No superimposed restricted diffusion to suggest acute infarction. No midline shift, ventriculomegaly, extra-axial collection or acute intracranial hemorrhage. Cervicomedullary junction and pituitary are within normal limits. Normal background gray and white matter signal. No cortical encephalomalacia or chronic cerebral blood products. Vascular: Major intracranial vascular flow voids in the are stable since 2020. Dominant left vertebral artery. The major dural venous sinuses are enhancing and appear to be patent. Skull and upper cervical spine: Negative. Visualized bone marrow signal is within normal limits. Sinuses/Orbits: Negative. Other: Trace right mastoid fluid.  Negative nasopharynx. Negative visible scalp and face. IMPRESSION: 1. Metastatic disease to the brain. Two enhancing brain masses with moderate associated vasogenic edema: solid 16 mm left parietal lobe mass, and a larger 29 mm rim enhancing cystic or necrotic mass in the left inferior frontal gyrus. Two other small 2-3 mm metastases in the right inferior frontal gyrus, right posterior temporal lobe. And questionable two additional punctate metastases in both superior frontal gyri abutting the falx (series 18, image 46). 2. No significant intracranial mass effect. No other intracranial abnormality. Electronically Signed   By: Genevie Ann M.D.   On: 04/23/2021 06:41   CT Abdomen Pelvis W Contrast  Result Date: 04/23/2021 CLINICAL DATA:  Metastatic disease evaluation. Brain metastases on MRI. EXAM: CT CHEST, ABDOMEN, AND PELVIS WITH CONTRAST TECHNIQUE: Multidetector CT imaging of the chest, abdomen and pelvis was performed following the standard protocol during bolus administration of intravenous contrast. CONTRAST:  40mL OMNIPAQUE IOHEXOL 300 MG/ML  SOLN COMPARISON:  05/25/2015 FINDINGS: CT CHEST FINDINGS Cardiovascular: Heart is normal size. Aorta is normal caliber. Mediastinum/Nodes: No mediastinal, hilar, or axillary adenopathy. Trachea and esophagus are unremarkable. Thyroid unremarkable. Lungs/Pleura: 4.5 cm left upper lobe spiculated mass compatible with primary lung cancer. No pleural effusions. This abuts the medial pleural surface and mediastinum. Few small adjacent pre-vascular lymph nodes, not pathologically enlarged based on CT size criteria. Musculoskeletal: Chest wall soft tissues are unremarkable. No acute bony abnormality. CT ABDOMEN PELVIS FINDINGS Hepatobiliary: Insert paddle biliary Pancreas: No focal abnormality or ductal dilatation. Spleen: No focal abnormality.  Normal size. Adrenals/Urinary Tract: No adrenal abnormality. No focal renal abnormality. No stones or hydronephrosis. Urinary bladder  is unremarkable. Stomach/Bowel: Stomach, large and small bowel grossly unremarkable. Vascular/Lymphatic: No evidence of aneurysm or adenopathy. Reproductive: Prior hysterectomy.  No adnexal masses. Other: No free fluid or free air. Musculoskeletal: No acute bony abnormality. IMPRESSION: 4.5 cm spiculated left upper lobe mass most compatible with primary lung cancer. This abuts the medial pleural surface. A few small adjacent pre-vascular mediastinal lymph nodes, none pathologically enlarged. Recommend cardiothoracic surgical and oncologic consultation. No acute findings or evidence of metastatic disease in the abdomen or pelvis. Electronically Signed   By: Rolm Baptise M.D.   On: 04/23/2021 22:38   DG Chest Port 1 View  Result Date: 04/25/2021 CLINICAL DATA:  Chest tube, pneumothorax EXAM: PORTABLE CHEST 1 VIEW COMPARISON:  Chest x-ray 04/24/2021 FINDINGS: Right-sided chest tube in place. Interval expansion of the right lung with persistent small pneumothorax measuring approximately 12 mm from the apex. Stable masslike opacity in the left upper lung  zone. Cardiomediastinal silhouette is unchanged. No significant pleural effusion visualized. IMPRESSION: Small right pneumothorax, improved since previous study. Electronically Signed   By: Ofilia Neas M.D.   On: 04/25/2021 08:48   DG CHEST PORT 1 VIEW  Result Date: 04/24/2021 CLINICAL DATA:  Follow-up pneumothorax. EXAM: PORTABLE CHEST 1 VIEW COMPARISON:  1-1/2 hours ago. FINDINGS: Pigtail catheter projects over the right mid lower lung zone. Unchanged size of moderate right pneumothorax. Pleural edge is at least 3 cm from the lung apex. There is increasing opacity in the medial right lung base. No mediastinal shift. Low lung volumes with masslike opacity in the left upper lobe unchanged. Otherwise unchanged exam. IMPRESSION: 1. Unchanged size of moderate right pneumothorax. Right chest tube remains in place. 2. Increasing opacity in the medial right lung  base, likely atelectasis. 3. Unchanged masslike opacity in the left upper lobe. Electronically Signed   By: Keith Rake M.D.   On: 04/24/2021 19:23   EEG adult  Result Date: 04/24/2021 Derek Jack, MD     04/24/2021  2:05 PM Routine EEG Report CHARLEIGH CORRENTI is a 55 y.o. female with a history of presyncope who is undergoing an EEG to evaluate for seizures. Report: This EEG was acquired with electrodes placed according to the International 10-20 electrode system (including Fp1, Fp2, F3, F4, C3, C4, P3, P4, O1, O2, T3, T4, T5, T6, A1, A2, Fz, Cz, Pz). The following electrodes were missing or displaced: none. The occipital dominant rhythm was 9 Hz. This activity is reactive to stimulation. Drowsiness was manifested by background fragmentation; deeper stages of sleep were not identified. There was intermittent focal slowing over the left posterior region. There were no interictal epileptiform discharges. There were no electrographic seizures identified. Photic stimulation and hyperventilation were not performed. Impression and clinical correlation: This EEG was obtained while awake and drowsy and is abnormal due to intermittent focal slowing over the left posterior region, indicative of focal cerebral dysfunction in that area. There were no electrographic seizures observed during this recording. Su Monks, MD [facility] [phone] If 7pm- 7am, please page neurology on call as listed in [place].   DG C-ARM BRONCHOSCOPY  Result Date: 04/24/2021 C-ARM BRONCHOSCOPY: Fluoroscopy was utilized by the requesting physician.  No radiographic interpretation.

## 2021-04-26 NOTE — Plan of Care (Signed)
  Problem: Education: Goal: Knowledge of General Education information will improve Description: Including pain rating scale, medication(s)/side effects and non-pharmacologic comfort measures Outcome: Progressing   Problem: Clinical Measurements: Goal: Ability to maintain clinical measurements within normal limits will improve Outcome: Progressing Goal: Will remain free from infection Outcome: Progressing Goal: Diagnostic test results will improve Outcome: Progressing Goal: Respiratory complications will improve Outcome: Progressing Goal: Cardiovascular complication will be avoided Outcome: Progressing   Problem: Activity: Goal: Risk for activity intolerance will decrease Outcome: Progressing   Problem: Nutrition: Goal: Adequate nutrition will be maintained Outcome: Progressing   Problem: Coping: Goal: Level of anxiety will decrease Outcome: Progressing   Problem: Elimination: Goal: Will not experience complications related to bowel motility Outcome: Progressing Goal: Will not experience complications related to urinary retention Outcome: Progressing   Problem: Pain Managment: Goal: General experience of comfort will improve Outcome: Progressing   Problem: Safety: Goal: Ability to remain free from injury will improve Outcome: Progressing

## 2021-04-26 NOTE — Progress Notes (Signed)
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    ICD-10-CM   1. Brain metastases ([place])  C79.31       HISTORY OF PRESENT ILLNESS: Abigail Golden is a 55 y.o. female seen at the request of Dr. Burr Medico.  She presented to her PCP with new onset episodes of occipital headache, dizziness approximately 10 days. Has a history of migraines, typical symptoms include frontal headache with associated nausea, photosensitivity. Baseline migraines typically resolve with use of diclofenac or eletriptan. Around 10 days ago she developed posterior headache with associated tingling in the occipital and neck regions with associated lightheadedness, nausea, dizziness and some flashing sensation in the vision. This episode lasted approximately 5 minutes. Since then has been having intermittent episodes, with at least 1 episode per day. Episodes generally occur while at rest and has even awoken her from her sleep. Episodes lasting 5 to 15 minutes. There has been no associated weakness, facial droop, slurred speech.   Outpatient head CT wihtout contrast revealed 2 separate areas of vasogenic edema, 1 within the inferiorleft frontal region in the other at the left parietal vertex. There are presumed underlying masses in both of those areas, though they are not clearly depicted.    She was instructed to proceed to the ED where MRI revealed at least 5 brain metastases showed a 16 mm mass in the posterosuperior left parietal lobe, a 29 mm rim mass in the left temporal lobe, a punctate 2-3 mm lesion right temporal lobe and suspicion of 2 similar small nodules in the  superior frontal gyri abutting the midline.   Since these lesions were suggestive of brain metastases, the patient underwent CT Chest, and Abd/pelvis.  Chest CT showed a 4.5 cm left upper lobe spiculated mass compatible with primary lung cancer.    She is status post bronchoscopy with path pending.  She experienced a pneumothorax post bronch and is hospitalized with a chest tube.  PREVIOUS RADIATION THERAPY: No  PAST MEDICAL HISTORY:  Past Medical History:  Diagnosis Date   Allergy    Anxiety    Chronic bronchitis ([place])    Depression    Headache    Hypoglycemia    Lower back pain    since fall ~ 2012 (05/25/2015)   Migraine    05/25/2015 "probably once/month"   Migraine headache    Pain in left hip    since fall ~ 2012 (05/25/2015)   Pneumonia "several times"      PAST SURGICAL HISTORY: Past Surgical History:  Procedure Laterality Date   ABDOMINAL HYSTERECTOMY  11/2002   "found benign tumors"   CHOLECYSTECTOMY N/A 05/25/2015   Procedure: LAPAROSCOPIC CHOLECYSTECTOMY;  Surgeon: Ralene Ok, MD;  Location: [facility];  Service: General;  Laterality: N/A;   LAPAROSCOPIC CHOLECYSTECTOMY  05/25/2015   MENISCUS REPAIR Right 03/2017    FAMILY HISTORY:  Family History  Problem Relation Age of Onset   Heart disease Mother    Yves Dill Parkinson White syndrome Father    Yves Dill Parkinson White syndrome Paternal Grandmother    Diabetes Paternal Grandmother    Breast cancer Neg Hx     SOCIAL HISTORY:  Social  History   Socioeconomic History   Marital status: Single    Spouse name: Not on file   Number of children: 0   Years of education: 2 years of college   Highest education level: Not on file  Occupational History   Not on file  Tobacco Use   Smoking status: Never   Smokeless tobacco: Never  Substance and Sexual Activity   Alcohol use: Never   Drug use: Never   Sexual activity: Not on file  Other Topics Concern   Not on file  Social History Narrative   Lives at home  alone   Caffeine: 16 oz daily   Social Determinants of Health   Financial Resource Strain: Not on file  Food Insecurity: Not on file  Transportation Needs: Not on file  Physical Activity: Not on file  Stress: Not on file  Social Connections: Not on file  Intimate Partner Violence: Not on file    ALLERGIES: Celebrex [celecoxib] and Minocycline  MEDICATIONS:  No current facility-administered medications for this encounter.   No current outpatient medications on file.   Facility-Administered Medications Ordered in Other Encounters  Medication Dose Route Frequency Provider Last Rate Last Admin   acetaminophen (TYLENOL) tablet 650 mg  650 mg Oral Q6H PRN Rise Patience, MD       Or   acetaminophen (TYLENOL) suppository 650 mg  650 mg Rectal Q6H PRN Rise Patience, MD       albuterol (PROVENTIL) (2.5 MG/3ML) 0.083% nebulizer solution 2.5 mg  2.5 mg Nebulization Q6H PRN Icard, Bradley L, DO       ALPRAZolam Duanne Moron) tablet 0.25 mg  0.25 mg Oral Q4H PRN Rise Patience, MD   0.25 mg at 04/25/21 2158   citalopram (CELEXA) tablet 10 mg  10 mg Oral QHS Rise Patience, MD   10 mg at 04/25/21 2150   darifenacin (ENABLEX) 24 hr tablet 7.5 mg  7.5 mg Oral QHS Rise Patience, MD   7.5 mg at 04/25/21 2150   dexamethasone (DECADRON) injection 4 mg  4 mg Intravenous Q6H Rise Patience, MD   4 mg at 04/26/21 0538   diphenhydrAMINE (BENADRYL) capsule 25 mg  25 mg Oral QHS Rise Patience, MD   25 mg at 04/25/21 2151   diphenhydrAMINE (BENADRYL) capsule 25 mg  25 mg Oral Daily PRN Rise Patience, MD       heparin injection 5,000 Units  5,000 Units Subcutaneous Q8H Elgergawy, Silver Huguenin, MD   5,000 Units at 04/26/21 0539   hydrOXYzine (ATARAX/VISTARIL) tablet 25 mg  25 mg Oral QHS Rise Patience, MD   25 mg at 04/25/21 2151   hydrOXYzine (ATARAX/VISTARIL) tablet 25 mg  25 mg Oral Daily PRN Rise Patience, MD       insulin aspart (novoLOG) injection  0-5 Units  0-5 Units Subcutaneous QHS Thurnell Lose, MD       insulin aspart (novoLOG) injection 0-9 Units  0-9 Units Subcutaneous TID WC Thurnell Lose, MD   1 Units at 04/26/21 0865   lactated ringers infusion   Intravenous Continuous Janeece Riggers, MD 10 mL/hr at 04/24/21 1357 New Bag at 04/24/21 1357   loratadine (CLARITIN) tablet 10 mg  10 mg Oral Daily Rise Patience, MD   10 mg at 04/26/21 0854   montelukast (SINGULAIR) tablet 10 mg  10 mg Oral QHS Rise Patience, MD   10 mg at 04/25/21 2151   morphine 2 MG/ML injection  2-4 mg  2-4 mg Intravenous Q4H PRN Candee Furbish, MD   2 mg at 04/25/21 2346   oxyCODONE-acetaminophen (PERCOCET/ROXICET) 5-325 MG per tablet 1-2 tablet  1-2 tablet Oral Q6H PRN Candee Furbish, MD   2 tablet at 04/26/21 7026   sodium chloride flush (NS) 0.9 % injection 10 mL  10 mL Other Q8H Candee Furbish, MD   10 mL at 04/26/21 3785    REVIEW OF SYSTEMS:  On review of systems, the patient symptoms are described in the HPI.  Otherwise she is doing well overall. She denies any chest pain, shortness of breath, cough, fevers, chills, night sweats, unintended weight changes. She denies any bowel or bladder disturbances, and denies abdominal pain, nausea or vomiting. She denies any new musculoskeletal or joint aches or pains. A complete review of systems is obtained and is otherwise negative.    PHYSICAL EXAM:  Wt Readings from Last 3 Encounters:  04/24/21 280 lb (127 kg)  09/08/18 265 lb (120.2 kg)  02/27/17 270 lb (122.5 kg)   Temp Readings from Last 3 Encounters:  04/26/21 97.6 F (36.4 C) (Oral)  04/23/21 97.8 F (36.6 C) (Oral)  02/27/17 97.6 F (36.4 C) (Oral)   BP Readings from Last 3 Encounters:  04/26/21 118/65  04/23/21 136/64  09/08/18 138/82   Pulse Readings from Last 3 Encounters:  04/26/21 62  04/23/21 (!) 59  09/08/18 67    /10 Over the phone, she no acute distress. She is alert and oriented x4 and appropriate throughout  the examination.    KPS = 80  100 - Normal; no complaints; no evidence of disease. 90   - Able to carry on normal activity; minor signs or symptoms of disease. 80   - Normal activity with effort; some signs or symptoms of disease. 49   - Cares for self; unable to carry on normal activity or to do active work. 60   - Requires occasional assistance, but is able to care for most of his personal needs. 50   - Requires considerable assistance and frequent medical care. 67   - Disabled; requires special care and assistance. 29   - Severely disabled; hospital admission is indicated although death not imminent. 46   - Very sick; hospital admission necessary; active supportive treatment necessary. 10   - Moribund; fatal processes progressing rapidly. 0     - Dead  Karnofsky DA, Abelmann L[place], Craver LS and Burchenal JH [phone]) The use of the nitrogen mustards in the palliative treatment of carcinoma: with particular reference to bronchogenic carcinoma Cancer 1 634-56  LABORATORY DATA:  Lab Results  Component Value Date   WBC 7.5 04/26/2021   HGB 11.3 (L) 04/26/2021   HCT 33.7 (L) 04/26/2021   MCV 87.1 04/26/2021   PLT 210 04/26/2021   Lab Results  Component Value Date   NA 135 04/26/2021   K 4.4 04/26/2021   CL 104 04/26/2021   CO2 24 04/26/2021   Lab Results  Component Value Date   ALT 14 04/26/2021   AST 15 04/26/2021   ALKPHOS 50 04/26/2021   BILITOT 0.7 04/26/2021     RADIOGRAPHY: DG Chest 1 View  Result Date: 04/24/2021 CLINICAL DATA:  Post bronchoscopy and right chest tube placement. Pneumothorax. EXAM: CHEST  1 VIEW COMPARISON:  Chest CT 04/23/2021 FINDINGS: Left upper lobe mass noted as seen on prior CT. Right chest tube in place. Lucency noted over the right lateral chest and right apex concerning for  pneumothorax, but the pleural edge is difficult to visualize on this portable study. Right basilar opacity, likely atelectasis. No visible effusions. IMPRESSION: Right chest  tube in place. Lucency over the lateral and upper right hemithorax likely reflects pneumothorax, but pleural edge is difficult to visualize. Left upper lobe mass again noted. Electronically Signed   By: Rolm Baptise M.D.   On: 04/24/2021 18:06   CT HEAD WO CONTRAST (5MM)  Result Date: 04/22/2021 CLINICAL DATA:  Worsening headaches.  Dizziness and lightheaded. EXAM: CT HEAD WITHOUT CONTRAST TECHNIQUE: Contiguous axial images were obtained from the base of the skull through the vertex without intravenous contrast. COMPARISON:  Brain MRI 07/15/2018 FINDINGS: Brain: No abnormality seen affecting the brainstem or cerebellum. There are 2 separate areas of vasogenic edema, 1 within the inferior left frontal region in the other at the left parietal vertex. There are presumed underlying masses in both of those areas, though they are not clearly depicted. No evidence of hemorrhage. No left-to-right shift. No abnormality seen in the right hemisphere. No extra-axial fluid collection. The differential diagnosis includes venous infarctions and cerebritis. MRI with contrast strongly recommended. Vascular: No abnormal vascular finding. Skull: Normal Sinuses/Orbits: Clear/normal Other: None IMPRESSION: Two separate areas of vasogenic edema within the left hemisphere, 1 within the inferior frontal region in the other at the left parietal vertex. Metastatic disease would be the most likely cause of this appearance. Other possibilities include venous infarctions and cerebritis. MRI with contrast recommended. Call report in progress. Electronically Signed   By: Nelson Chimes M.D.   On: 04/22/2021 16:07   CT Chest W Contrast  Result Date: 04/23/2021 CLINICAL DATA:  Metastatic disease evaluation. Brain metastases on MRI. EXAM: CT CHEST, ABDOMEN, AND PELVIS WITH CONTRAST TECHNIQUE: Multidetector CT imaging of the chest, abdomen and pelvis was performed following the standard protocol during bolus administration of intravenous  contrast. CONTRAST:  48mL OMNIPAQUE IOHEXOL 300 MG/ML  SOLN COMPARISON:  05/25/2015 FINDINGS: CT CHEST FINDINGS Cardiovascular: Heart is normal size. Aorta is normal caliber. Mediastinum/Nodes: No mediastinal, hilar, or axillary adenopathy. Trachea and esophagus are unremarkable. Thyroid unremarkable. Lungs/Pleura: 4.5 cm left upper lobe spiculated mass compatible with primary lung cancer. No pleural effusions. This abuts the medial pleural surface and mediastinum. Few small adjacent pre-vascular lymph nodes, not pathologically enlarged based on CT size criteria. Musculoskeletal: Chest wall soft tissues are unremarkable. No acute bony abnormality. CT ABDOMEN PELVIS FINDINGS Hepatobiliary: Insert paddle biliary Pancreas: No focal abnormality or ductal dilatation. Spleen: No focal abnormality.  Normal size. Adrenals/Urinary Tract: No adrenal abnormality. No focal renal abnormality. No stones or hydronephrosis. Urinary bladder is unremarkable. Stomach/Bowel: Stomach, large and small bowel grossly unremarkable. Vascular/Lymphatic: No evidence of aneurysm or adenopathy. Reproductive: Prior hysterectomy.  No adnexal masses. Other: No free fluid or free air. Musculoskeletal: No acute bony abnormality. IMPRESSION: 4.5 cm spiculated left upper lobe mass most compatible with primary lung cancer. This abuts the medial pleural surface. A few small adjacent pre-vascular mediastinal lymph nodes, none pathologically enlarged. Recommend cardiothoracic surgical and oncologic consultation. No acute findings or evidence of metastatic disease in the abdomen or pelvis. Electronically Signed   By: Rolm Baptise M.D.   On: 04/23/2021 22:38   MR Brain W and Wo Contrast  Result Date: 04/23/2021 CLINICAL DATA:  55 year old female with increasing headaches. Multifocal vasogenic edema in the brain on plain head CT yesterday. EXAM: MRI HEAD WITHOUT AND WITH CONTRAST TECHNIQUE: Multiplanar, multiecho pulse sequences of the brain and  surrounding structures were obtained without and  with intravenous contrast. CONTRAST:  62mL GADAVIST GADOBUTROL 1 MMOL/ML IV SOLN COMPARISON:  Head CT 04/22/2021. Brain MRI without contrast 07/15/2018. FINDINGS: Brain: Solid enhancing 16 mm mass in the posterosuperior left parietal lobe with moderate regional vasogenic edema (series 18, image 41 and series 20, image 18). Only mild regional mass effect. Large up to 29 mm rim enhancing cystic or necrotic mass in the left hemisphere at the inferior frontal gyrus junction with the temporal lobe (series 18, image 22 and series 19, image 21. Moderate vasogenic edema in the region but only mild regional mass effect - including on the anterior left temporal tip. Punctate 2-3 mm abnormal enhancement in the posterior right temporal lobe seen on both series 20, image 6 and series 19, image 14. Subtle similar right inferior frontal gyrus lesion best seen on series 18, image 24 and series 20, image 13. No associated edema. And suspicion of 2 similar small nodular areas of enhancement in the superior frontal gyri abutting the midline. See series 18, image 46 and series 19, images 19 and 17. Less likely these could be dural based. No edema. No dural thickening or enhancement elsewhere. No superimposed restricted diffusion to suggest acute infarction. No midline shift, ventriculomegaly, extra-axial collection or acute intracranial hemorrhage. Cervicomedullary junction and pituitary are within normal limits. Normal background gray and white matter signal. No cortical encephalomalacia or chronic cerebral blood products. Vascular: Major intracranial vascular flow voids in the are stable since 2020. Dominant left vertebral artery. The major dural venous sinuses are enhancing and appear to be patent. Skull and upper cervical spine: Negative. Visualized bone marrow signal is within normal limits. Sinuses/Orbits: Negative. Other: Trace right mastoid fluid. Negative nasopharynx. Negative  visible scalp and face. IMPRESSION: 1. Metastatic disease to the brain. Two enhancing brain masses with moderate associated vasogenic edema: solid 16 mm left parietal lobe mass, and a larger 29 mm rim enhancing cystic or necrotic mass in the left inferior frontal gyrus. Two other small 2-3 mm metastases in the right inferior frontal gyrus, right posterior temporal lobe. And questionable two additional punctate metastases in both superior frontal gyri abutting the falx (series 18, image 46). 2. No significant intracranial mass effect. No other intracranial abnormality. Electronically Signed   By: Genevie Ann M.D.   On: 04/23/2021 06:41   CT Abdomen Pelvis W Contrast  Result Date: 04/23/2021 CLINICAL DATA:  Metastatic disease evaluation. Brain metastases on MRI. EXAM: CT CHEST, ABDOMEN, AND PELVIS WITH CONTRAST TECHNIQUE: Multidetector CT imaging of the chest, abdomen and pelvis was performed following the standard protocol during bolus administration of intravenous contrast. CONTRAST:  82mL OMNIPAQUE IOHEXOL 300 MG/ML  SOLN COMPARISON:  05/25/2015 FINDINGS: CT CHEST FINDINGS Cardiovascular: Heart is normal size. Aorta is normal caliber. Mediastinum/Nodes: No mediastinal, hilar, or axillary adenopathy. Trachea and esophagus are unremarkable. Thyroid unremarkable. Lungs/Pleura: 4.5 cm left upper lobe spiculated mass compatible with primary lung cancer. No pleural effusions. This abuts the medial pleural surface and mediastinum. Few small adjacent pre-vascular lymph nodes, not pathologically enlarged based on CT size criteria. Musculoskeletal: Chest wall soft tissues are unremarkable. No acute bony abnormality. CT ABDOMEN PELVIS FINDINGS Hepatobiliary: Insert paddle biliary Pancreas: No focal abnormality or ductal dilatation. Spleen: No focal abnormality.  Normal size. Adrenals/Urinary Tract: No adrenal abnormality. No focal renal abnormality. No stones or hydronephrosis. Urinary bladder is unremarkable. Stomach/Bowel:  Stomach, large and small bowel grossly unremarkable. Vascular/Lymphatic: No evidence of aneurysm or adenopathy. Reproductive: Prior hysterectomy.  No adnexal masses. Other: No free fluid or  free air. Musculoskeletal: No acute bony abnormality. IMPRESSION: 4.5 cm spiculated left upper lobe mass most compatible with primary lung cancer. This abuts the medial pleural surface. A few small adjacent pre-vascular mediastinal lymph nodes, none pathologically enlarged. Recommend cardiothoracic surgical and oncologic consultation. No acute findings or evidence of metastatic disease in the abdomen or pelvis. Electronically Signed   By: Rolm Baptise M.D.   On: 04/23/2021 22:38   DG Chest Port 1 View  Result Date: 04/25/2021 CLINICAL DATA:  Chest tube, pneumothorax EXAM: PORTABLE CHEST 1 VIEW COMPARISON:  Chest x-ray 04/24/2021 FINDINGS: Right-sided chest tube in place. Interval expansion of the right lung with persistent small pneumothorax measuring approximately 12 mm from the apex. Stable masslike opacity in the left upper lung zone. Cardiomediastinal silhouette is unchanged. No significant pleural effusion visualized. IMPRESSION: Small right pneumothorax, improved since previous study. Electronically Signed   By: Ofilia Neas M.D.   On: 04/25/2021 08:48   DG CHEST PORT 1 VIEW  Result Date: 04/24/2021 CLINICAL DATA:  Follow-up pneumothorax. EXAM: PORTABLE CHEST 1 VIEW COMPARISON:  1-1/2 hours ago. FINDINGS: Pigtail catheter projects over the right mid lower lung zone. Unchanged size of moderate right pneumothorax. Pleural edge is at least 3 cm from the lung apex. There is increasing opacity in the medial right lung base. No mediastinal shift. Low lung volumes with masslike opacity in the left upper lobe unchanged. Otherwise unchanged exam. IMPRESSION: 1. Unchanged size of moderate right pneumothorax. Right chest tube remains in place. 2. Increasing opacity in the medial right lung base, likely atelectasis. 3.  Unchanged masslike opacity in the left upper lobe. Electronically Signed   By: Keith Rake M.D.   On: 04/24/2021 19:23   EEG adult  Result Date: 04/24/2021 Derek Jack, MD     04/24/2021  2:05 PM Routine EEG Report Abigail Golden is a 55 y.o. female with a history of presyncope who is undergoing an EEG to evaluate for seizures. Report: This EEG was acquired with electrodes placed according to the International 10-20 electrode system (including Fp1, Fp2, F3, F4, C3, C4, P3, P4, O1, O2, T3, T4, T5, T6, A1, A2, Fz, Cz, Pz). The following electrodes were missing or displaced: none. The occipital dominant rhythm was 9 Hz. This activity is reactive to stimulation. Drowsiness was manifested by background fragmentation; deeper stages of sleep were not identified. There was intermittent focal slowing over the left posterior region. There were no interictal epileptiform discharges. There were no electrographic seizures identified. Photic stimulation and hyperventilation were not performed. Impression and clinical correlation: This EEG was obtained while awake and drowsy and is abnormal due to intermittent focal slowing over the left posterior region, indicative of focal cerebral dysfunction in that area. There were no electrographic seizures observed during this recording. Su Monks, MD [facility] [phone] If 7pm- 7am, please page neurology on call as listed in [place].   DG C-ARM BRONCHOSCOPY  Result Date: 04/24/2021 C-ARM BRONCHOSCOPY: Fluoroscopy was utilized by the requesting physician.  No radiographic interpretation.      IMPRESSION/PLAN: 1. 55 y.o.  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  At this point, the patient would potentially benefit from radiotherapy. The options include whole brain irradiation versus stereotactic radiosurgery. There are pros and cons associated with each of these potential treatment options.  Whole brain radiotherapy would treat the known metastatic deposits and help provide some reduction of risk for future brain metastases. However, whole brain radiotherapy  carries potential risks including hair loss, subacute somnolence, and neurocognitive changes including a possible reduction in short-term memory. Whole brain radiotherapy also may carry a lower likelihood of tumor control at the treatment sites because of the low-dose used. Stereotactic radiosurgery carries a higher likelihood for local tumor control at the targeted sites with lower associated risk for neurocognitive changes such as memory loss. However, the use of stereotactic radiosurgery in this setting may leave the patient at increased risk for new brain metastases elsewhere in the brain as high as 50-60%. Accordingly, patients who receive stereotactic radiosurgery in this setting should undergo ongoing surveillance imaging with brain MRI more frequently in order to identify and treat new small brain metastases before they become symptomatic. Stereotactic radiosurgery does carry some different risks, including a risk of radionecrosis.  PLAN: Today, I reviewed the findings and workup thus far with the patient. We discussed the dilemma regarding whole brain radiotherapy versus stereotactic radiosurgery. We discussed the pros and cons of each. We also discussed the logistics and delivery of each. We reviewed the results associated with each of the treatments described above. The patient seems to understand the treatment options and would like to proceed with stereotactic radiosurgery.  I personally spent 60 minutes in this encounter including chart review, reviewing radiological studies, meeting face-to-face with the patient, entering orders and completing documentation.    Nicholos Johns, PA-C    Tyler Pita, MD  [facility] Direct Dial: [phone]  Fax: [phone] .com  Skype   LinkedIn

## 2021-04-26 NOTE — Progress Notes (Signed)
   04/26/21 0930  Clinical Encounter Type  Visited With Patient  Visit Type Initial;Spiritual support  Referral From Nurse  Consult/Referral To Chaplain   Chaplain responded to the consult request for an Advance Directive. This Chaplain provided A.D. education. 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 She is appointing a friend whom she helped raise. This Chaplain advised the patient to inform the nurse when she was ready for a notary. Chaplain offered prayer. This note was prepared by Jeanine Luz, M.Div..  For questions please contact by phone [phone].

## 2021-04-26 NOTE — Plan of Care (Signed)

## 2021-04-26 NOTE — Progress Notes (Signed)
Occupational Therapy Treatment Patient Details Name: Abigail Golden MRN: [id number] DOB: [birth year] Today's Date: 04/26/2021   History of present illness Pt is a 55 y/o female admitted 11/1 secondary to headache and dizziness.Imaging revealed L parietal and frontal mass. Also found to have mass in L upper lobe of lung. Workup pending. PMH includes migraines.   OT comments  Patient with good progress toward all patient focused goals.  Deficit is chest tube site, which dies impact mobility and ADL status/independence.  It is expected once that is removed, she should regain her independence.  Her plan is to discharge home with friends, and will have 24 hour assist as needed.  Patient encouraged to mobilize with staff, and patient verbalizes understanding.  No cognitive deficits noted.     Recommendations for follow up therapy are one component of a multi-disciplinary discharge planning process, led by the attending physician.  Recommendations may be updated based on patient status, additional functional criteria and insurance authorization.    Follow Up Recommendations  No OT follow up    Assistance Recommended at Discharge PRN  Equipment Recommendations  None recommended by OT    Recommendations for Other Services      Precautions / Restrictions Precautions Precautions: Other (comment) Precaution Comments: chest tube Restrictions Weight Bearing Restrictions: No       Mobility Bed Mobility   Bed Mobility: Supine to Sit     Supine to sit: HOB elevated;Supervision       Patient Response: Cooperative  Transfers Overall transfer level: Needs assistance Equipment used: None Transfers: Sit to/from Stand Sit to Stand: Min assist                 Balance Overall balance assessment: Mild deficits observed, not formally tested                                         ADL either performed or assessed with clinical judgement   ADL       Grooming:  Wash/dry hands;Wash/dry face;Set up;Sitting                   Toilet Transfer: Sales executive;Ambulation   Toileting- Clothing Manipulation and Hygiene: Independent               Vision Patient Visual Report: No change from baseline     Perception Perception Perception: Not tested   Praxis Praxis Praxis: Not tested    Cognition                                                                    Pertinent Vitals/ Pain       Pain Assessment: Faces Faces Pain Scale: Hurts little more Pain Location: chest tube site Pain Descriptors / Indicators: Tender;Pressure Pain Intervention(s): Monitored during session                                                          Frequency    D/c acute  OT       Progress Toward Goals  OT Goals(current goals can now be found in the care plan section)  Progress towards OT goals: Progressing toward goals  Acute Rehab OT Goals Patient Stated Goal: Return home OT Goal Formulation: With patient Time For Goal Achievement: 05/09/21 Potential to Achieve Goals: Good  Plan All goals met and education completed, patient discharged from OT services    Co-evaluation                 AM-PAC OT "6 Clicks" Daily Activity     Outcome Measure   Help from another person eating meals?: None Help from another person taking care of personal grooming?: None Help from another person toileting, which includes using toliet, bedpan, or urinal?: A Little Help from another person bathing (including washing, rinsing, drying)?: A Little Help from another person to put on and taking off regular upper body clothing?: A Little Help from another person to put on and taking off regular lower body clothing?: A Little 6 Click Score: 20    End of Session Equipment Utilized During Treatment: Oxygen  OT Visit Diagnosis: Unsteadiness on feet (R26.81);Pain Pain - Right/Left:  Right   Activity Tolerance Patient tolerated treatment well   Patient Left in chair;with call bell/phone within reach   Nurse Communication          Time: 1216-2446 OT Time Calculation (min): 30 min  Charges: OT General Charges $OT Visit: 1 Visit OT Treatments $Self Care/Home Management : 23-37 mins  04/26/2021  RP, OTR/L  Acute Rehabilitation Services  Office:  [phone]   Metta Clines 04/26/2021, 9:43 AM

## 2021-04-26 NOTE — Anesthesia Postprocedure Evaluation (Signed)
Anesthesia Post Note  Patient: Abigail Golden  Procedure(s) Performed: VIDEO BRONCHOSCOPY WITH ENDOBRONCHIAL NAVIGATION VIDEO BRONCHOSCOPY WITH RADIAL ENDOBRONCHIAL ULTRASOUND BRONCHIAL NEEDLE ASPIRATION BIOPSIES BRONCHIAL BRUSHINGS BRONCHIAL BIOPSIES CHEST TUBE INSERTION     Patient location during evaluation: PACU Anesthesia Type: General Level of consciousness: awake and alert Pain management: pain level controlled Vital Signs Assessment: post-procedure vital signs reviewed and stable Respiratory status: spontaneous breathing, nonlabored ventilation, respiratory function stable and patient connected to nasal cannula oxygen Cardiovascular status: blood pressure returned to baseline and stable Postop Assessment: no apparent nausea or vomiting Anesthetic complications: yes (Pt developed right sided pneumothorax likely related to reintubation performed due to inadequate tube size. Recognized and chest tube placed by pulmonologist. Pt extubated after meeting extubation criteria.)   No notable events documented.  Last Vitals:  Vitals:   04/25/21 2100 04/26/21 0629  BP: 133/65 118/65  Pulse: 65 62  Resp: 14 18  Temp: 36.7 C 36.4 C  SpO2: 94% 95%    Last Pain:  Vitals:   04/26/21 0629  TempSrc: Oral  PainSc:    Pain Goal:                   Tiajuana Amass

## 2021-04-26 NOTE — Progress Notes (Signed)
   NAME:  Abigail Golden, MRN:  [id number], DOB:  1965/08/12, LOS: 2 ADMISSION DATE:  04/23/2021, CONSULTATION DATE:  04/24/21 REFERRING MD:  Candiss Norse, CHIEF COMPLAINT:  headaches   History of Present Illness:  55 year old never smoker w/ hx of seasonal allergies, allergic bronchitis, childhood migraines presenting with 3-4 weeks of recurrent dizziness episodes associated with neuralgia-type pain across back of head.  Workup has revealed two brain metastases as well as a large LUL mass for which PCCM consulted.  No family hx of early cancers, no unusual exposures. Has hx of hysterectomy for fibroids.  Denies SOB, hemoptysis.  Has postnasal drip and dry cough which is chronic and associated with her seasonal allergies.  She has no focal weakness, trouble speaking, shaking or seizure-like events.  There is a question of if she is losing consciousness with these dizzy spells.  Dizzy spells are worse with exertion.  Works for school system.  Pertinent  Medical History   Past Medical History:  Diagnosis Date   Allergy    Anxiety    Chronic bronchitis ([place])    Depression    Headache    Hypoglycemia    Lower back pain    since fall ~ 2012 (05/25/2015)   Migraine    05/25/2015 "probably once/month"   Migraine headache    Pain in left hip    since fall ~ 2012 (05/25/2015)   Pneumonia "several times"     Significant Hospital Events: Including procedures, antibiotic start and stop dates in addition to other pertinent events   11/1-2 admitted, bronch, PTX, chest tube 04/26/2021 right chest tube still with airleak on waterseal  Interim History / Subjective:  Chest tube still with airleak  Objective   Blood pressure 118/65, pulse 62, temperature 97.6 F (36.4 C), temperature source Oral, resp. rate 18, height 5' (1.524 m), weight 127 kg, SpO2 95 %.        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  Nonsmoker raises question of eGFR positivity.  Dr. Valeta Harms will fit in procedure today.  Will need close heme/onc and med/onc f/u. Presyncope r/o complex seizures Iatrogenic R PTX- due to single lung bagging during procedure yesterday  Continue Decadron Chest tube is been to waterseal for 24 hours we will check chest x-ray to ensure pneumothorax is not increasing in size Pain control as needed Encourage pulmonary toilet Surgery input appreciated Medical oncology consulted patient 04/25/2021 Will need radiation oncology consult also.      Best Practice (right click and "Reselect all SmartList Selections" daily)   Per primary  Richardson Landry Kenneith Stief ACNP Acute Care Nurse Practitioner [place] Please consult Amion 04/26/2021, 11:20 AM

## 2021-04-27 ENCOUNTER — Inpatient Hospital Stay (HOSPITAL_COMMUNITY): Payer: BC Managed Care – PPO

## 2021-04-27 DIAGNOSIS — R918 Other nonspecific abnormal finding of lung field: Secondary | ICD-10-CM | POA: Diagnosis not present

## 2021-04-27 DIAGNOSIS — J939 Pneumothorax, unspecified: Secondary | ICD-10-CM | POA: Diagnosis not present

## 2021-04-27 DIAGNOSIS — C7931 Secondary malignant neoplasm of brain: Secondary | ICD-10-CM | POA: Diagnosis not present

## 2021-04-27 LAB — COMPREHENSIVE METABOLIC PANEL
ALT: 14 U/L (ref 0–44)
AST: 13 U/L — ABNORMAL LOW (ref 15–41)
Albumin: 3.3 g/dL — ABNORMAL LOW (ref 3.5–5.0)
Alkaline Phosphatase: 50 U/L (ref 38–126)
Anion gap: 6 (ref 5–15)
BUN: 17 mg/dL (ref 6–20)
CO2: 25 mmol/L (ref 22–32)
Calcium: 9 mg/dL (ref 8.9–10.3)
Chloride: 103 mmol/L (ref 98–111)
Creatinine, Ser: 0.8 mg/dL (ref 0.44–1.00)
GFR, Estimated: >60 mL/min (ref >60)
Glucose, Bld: 122 mg/dL — ABNORMAL HIGH (ref 70–99)
Potassium: 4.5 mmol/L (ref 3.5–5.1)
Sodium: 134 mmol/L — ABNORMAL LOW (ref 135–145)
Total Bilirubin: 0.3 mg/dL (ref 0.3–1.2)
Total Protein: 6.6 g/dL (ref 6.5–8.1)

## 2021-04-27 LAB — CBC WITH DIFFERENTIAL/PLATELET
Abs Immature Granulocytes: 0.07 10*3/uL (ref 0.00–0.07)
Basophils Absolute: 0 10*3/uL (ref 0.0–0.1)
Basophils Relative: 0 %
Eosinophils Absolute: 0 10*3/uL (ref 0.0–0.5)
Eosinophils Relative: 0 %
HCT: 35.3 % — ABNORMAL LOW (ref 36.0–46.0)
Hemoglobin: 11.8 g/dL — ABNORMAL LOW (ref 12.0–15.0)
Immature Granulocytes: 1 %
Lymphocytes Relative: 26 %
Lymphs Abs: 1.8 10*3/uL (ref 0.7–4.0)
MCH: 29.3 pg (ref 26.0–34.0)
MCHC: 33.4 g/dL (ref 30.0–36.0)
MCV: 87.6 fL (ref 80.0–100.0)
Monocytes Absolute: 0.7 10*3/uL (ref 0.1–1.0)
Monocytes Relative: 10 %
Neutro Abs: 4.2 10*3/uL (ref 1.7–7.7)
Neutrophils Relative %: 63 %
Platelets: 235 10*3/uL (ref 150–400)
RBC: 4.03 MIL/uL (ref 3.87–5.11)
RDW: 12.5 % (ref 11.5–15.5)
WBC: 6.7 10*3/uL (ref 4.0–10.5)
nRBC: 0 % (ref 0.0–0.2)

## 2021-04-27 LAB — GLUCOSE, CAPILLARY
Glucose-Capillary: 220 mg/dL — ABNORMAL HIGH (ref 70–99)
Glucose-Capillary: 170 mg/dL — ABNORMAL HIGH (ref 70–99)
Glucose-Capillary: 127 mg/dL — ABNORMAL HIGH (ref 70–99)
Glucose-Capillary: 151 mg/dL — ABNORMAL HIGH (ref 70–99)

## 2021-04-27 LAB — BRAIN NATRIURETIC PEPTIDE: B Natriuretic Peptide: 46.6 pg/mL (ref 0.0–100.0)

## 2021-04-27 LAB — MAGNESIUM: Magnesium: 2.2 mg/dL (ref 1.7–2.4)

## 2021-04-27 MED ORDER — POLYETHYLENE GLYCOL 3350 17 G PO PACK
17.0000 g | PACK | Freq: Two times a day (BID) | ORAL | Status: AC
  Administered 2021-04-27: 17 g via ORAL
  Filled 2021-04-27 (×3): qty 1

## 2021-04-27 MED ORDER — SENNOSIDES-DOCUSATE SODIUM 8.6-50 MG PO TABS
1.0000 | ORAL_TABLET | Freq: Two times a day (BID) | ORAL | Status: DC
  Administered 2021-04-27 – 2021-04-30 (×5): 1 via ORAL
  Filled 2021-04-27 (×7): qty 1

## 2021-04-27 NOTE — Progress Notes (Signed)
   NAME:  Abigail Golden, MRN:  [id number], DOB:  10/09/1965, LOS: 3 ADMISSION DATE:  04/23/2021, CONSULTATION DATE:  04/24/21 REFERRING MD:  Candiss Norse, CHIEF COMPLAINT:  headaches   History of Present Illness:  55 year old never smoker w/ hx of seasonal allergies, allergic bronchitis, childhood migraines presenting with 3-4 weeks of recurrent dizziness episodes associated with neuralgia-type pain across back of head.  Workup has revealed two brain metastases as well as a large LUL mass for which PCCM consulted.  No family hx of early cancers, no unusual exposures. Has hx of hysterectomy for fibroids.  Denies SOB, hemoptysis.  Has postnasal drip and dry cough which is chronic and associated with her seasonal allergies.  She has no focal weakness, trouble speaking, shaking or seizure-like events.  There is a question of if she is losing consciousness with these dizzy spells.  Dizzy spells are worse with exertion.  Works for school system.  Pertinent  Medical History   Past Medical History:  Diagnosis Date   Allergy    Anxiety    Chronic bronchitis ([place])    Depression    Headache    Hypoglycemia    Lower back pain    since fall ~ 2012 (05/25/2015)   Migraine    05/25/2015 "probably once/month"   Migraine headache    Pain in left hip    since fall ~ 2012 (05/25/2015)   Pneumonia "several times"     Significant Hospital Events: Including procedures, antibiotic start and stop dates in addition to other pertinent events   11/1-2 admitted, bronch, PTX, chest tube 04/26/2021 right chest tube still with airleak on waterseal  Interim History / Subjective:  Air leak improved, on suction, lung up on CXR Patient in much better spirits this am  Objective   Blood pressure (!) 152/70, pulse 62, temperature 97.6 F (36.4 C), resp. rate 20, height 5' (1.524 m), weight 127 kg, SpO2 97 %.        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

## 2021-04-27 NOTE — Progress Notes (Signed)
PROGRESS NOTE                                                                                                                                                                                                             Patient Demographics:    Abigail Golden, is a 55 y.o. female, DOB - September 16, 1965, DGL:[id number]  Outpatient Primary MD for the patient is Aletha Halim., PA-C    LOS - 3  Admit date - 04/23/2021    No chief complaint on file.      Brief Narrative (HPI from H&P)    Abigail Golden is a 55 y.o. female with history of migraine and allergies presents to the ER after outpatient CT scan showing a mass, MRI brain here confirmed brain mass along further work-up also suggests primary lung cancer with mets to the brain.  She was admitted for further work-up.  She had a biopsy of left upper lobe mass via bronchoscopy 04/24/2021, she has right lung Pneumothorax after procedure, where she required chest tube.   Subjective:    Brailey Buescher today reports bowel movement yesterday, reports chest pain is controlled at insertion site, no nausea, no vomiting.  He does report some insomnia.      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 -work-up suspicious for primary metastatic lung cancer, initial biopsy results none small cell carcinoma (likely adenocarcinoma as she is non-smoker) . -Continue with IV steroids for brain mets. -Neurosurgery consulted, most likely will need radiation. -Oncology input greatly appreciated.   -Radiation oncology appreciated, he likely will need stereotactic radiosurgery    Iatrogenic right pneumothorax -Management per PCCM -Chest tube care per PCCM. -Pain is controlled on current regimen.   Morbid obesity with BMI of 50.  Follow with PCP.  History of  allergies and migraines.  Supportive care.      Condition - Extremely Guarded  Family Communication  :  none at bedside  Code Status :  Full  Consults  : PCCM, Oncology,neurosurgery   Procedures:  -Chest tube/bronchoscopy by [facility] 04/24/2021    Procedures  :     CT Chest - Abd and Pelvis - 4.5 cm spiculated left upper lobe mass most compatible with  primary lung cancer. This abuts the medial pleural surface. A few small adjacent pre-vascular mediastinal lymph nodes, none pathologically enlarged. Recommend cardiothoracic surgical and oncologic consultation. No acute findings or evidence of metastatic disease in the abdomen or pelvis  MRI Brain  - 1. Metastatic disease to the brain. Two enhancing brain masses with moderate associated vasogenic edema: solid 16 mm left parietal lobe mass, and a larger 29 mm rim enhancing cystic or necrotic mass in the left inferior frontal gyrus. Two other small 2-3 mm metastases in the right inferior frontal gyrus, right posterior temporal lobe. And questionable two additional punctate metastases in both superior frontal gyri abutting the falx (series 18, image 46). 2. No significant intracranial mass effect. No other intracranial abnormality      Disposition Plan  :    Status is: inp  DVT Prophylaxis  :    heparin injection 5,000 Units Start: 04/25/21 1400 Place and maintain sequential compression device Start: 04/24/21 0755  Lab Results  Component Value Date   PLT 235 04/27/2021    Diet :  Diet Order             Diet heart healthy/carb modified Room service appropriate? Yes; Fluid consistency: Thin  Diet effective now                    Inpatient Medications  Scheduled Meds:  citalopram  10 mg Oral QHS   darifenacin  7.5 mg Oral QHS   dexamethasone (DECADRON) injection  4 mg Intravenous Q6H   diphenhydrAMINE  25 mg Oral QHS   heparin injection (subcutaneous)  5,000 Units Subcutaneous Q8H   hydrOXYzine  25 mg Oral QHS   insulin  aspart  0-5 Units Subcutaneous QHS   insulin aspart  0-9 Units Subcutaneous TID WC   loratadine  10 mg Oral Daily   montelukast  10 mg Oral QHS   sodium chloride flush  10 mL Other Q8H   Continuous Infusions:  lactated ringers 10 mL/hr at 04/24/21 1357   PRN Meds:.acetaminophen **OR** acetaminophen, albuterol, ALPRAZolam, diphenhydrAMINE, hydrOXYzine, morphine injection, oxyCODONE-acetaminophen  Antibiotics  :    Anti-infectives (From admission, onward)    None         Phillips Climes M.D on 04/27/2021 at 1:05 PM  To page go to [email or web address]   [facility] -  Office  [phone]  See all Orders from today for further details    Objective:   Vitals:   04/27/21 0652 04/27/21 1000 04/27/21 1033 04/27/21 1215  BP: (!) 152/70   128/63  Pulse: 62   67  Resp: 20   17  Temp: 97.6 F (36.4 C)   97.9 F (36.6 C)  TempSrc:      SpO2: 100% 96% 97% 99%  Weight:      Height:        Wt Readings from Last 3 Encounters:  04/24/21 127 kg  09/08/18 120.2 kg  02/27/17 122.5 kg     Intake/Output Summary (Last 24 hours) at 04/27/2021 1305 Last data filed at 04/26/2021 1716 Gross per 24 hour  Intake 140 ml  Output 10 ml  Net 130 ml     Physical Exam Awake Alert, Oriented X 3, No new F.N deficits, Normal affect Symmetrical Chest wall movement, Good air movement bilaterally, CTAB RRR,No Gallops,Rubs or new Murmurs, No Parasternal Heave +ve B.Sounds, Abd Soft, No tenderness, No rebound - guarding or rigidity. No Cyanosis, Clubbing or edema, No new Rash or bruise  Data Review:    CBC Recent Labs  Lab 04/22/21 2042 04/24/21 0520 04/25/21 0316 04/26/21 0306 04/27/21 0426  WBC 5.5 4.6 10.7* 7.5 6.7  HGB 12.7 13.0 12.7 11.3* 11.8*  HCT 38.5 37.8 37.4 33.7* 35.3*  PLT 236 229 227 210 235  MCV 87.5 86.9 86.0 87.1 87.6  MCH 28.9 29.9 29.2 29.2 29.3  MCHC 33.0 34.4 34.0 33.5 33.4  RDW 12.6 12.6 12.7 12.8 12.5  LYMPHSABS 2.8 1.2 1.1 1.0 1.8  MONOABS 0.5  0.1 0.9 0.5 0.7  EOSABS 0.0 0.0 0.0 0.0 0.0  BASOSABS 0.0 0.0 0.0 0.0 0.0    Recent Labs  Lab 04/22/21 2042 04/24/21 0520 04/24/21 0802 04/24/21 2212 04/25/21 0316 04/26/21 0306 04/27/21 0426  NA 137 133*  --   --  135 135 134*  K 3.7 4.3  --   --  4.2 4.4 4.5  CL 104 104  --   --  104 104 103  CO2 26 18*  --   --  23 24 25   GLUCOSE 104* 166*  --   --  173* 161* 122*  BUN 11 10  --   --  15 16 17   CREATININE 0.94 0.77  --   --  1.19* 0.90 0.80  CALCIUM 9.0 8.9  --   --  8.9 8.8* 9.0  AST 16 17  --   --  19 15 13*  ALT 13 15  --   --  16 14 14   ALKPHOS 55 56  --   --  56 50 50  BILITOT 0.7 0.7  --   --  0.4 0.7 0.3  ALBUMIN 3.6 3.5  --   --  3.8 3.2* 3.3*  MG  --   --   --   --  1.9 2.2 2.2  INR  --   --  1.1  --   --   --   --   HGBA1C  --   --   --  5.2  --   --   --   BNP  --   --   --   --  38.5 57.5 46.6    ------------------------------------------------------------------------------------------------------------------ No results for input(s): CHOL, HDL, LDLCALC, TRIG, CHOLHDL, LDLDIRECT in the last 72 hours.  Lab Results  Component Value Date   HGBA1C 5.2 04/24/2021   ------------------------------------------------------------------------------------------------------------------ No results for input(s): TSH, T4TOTAL, T3FREE, THYROIDAB in the last 72 hours.  Invalid input(s): FREET3  Cardiac Enzymes No results for input(s): CKMB, TROPONINI, MYOGLOBIN in the last 168 hours.  Invalid input(s): CK ------------------------------------------------------------------------------------------------------------------    Component Value Date/Time   BNP 46.6 04/27/2021 0426     Radiology Reports DG Chest 1 View  Result Date: 04/24/2021 CLINICAL DATA:  Post bronchoscopy and right chest tube placement. Pneumothorax. EXAM: CHEST  1 VIEW COMPARISON:  Chest CT 04/23/2021 FINDINGS: Left upper lobe mass noted as seen on prior CT. Right chest tube in place. Lucency noted  over the right lateral chest and right apex concerning for pneumothorax, but the pleural edge is difficult to visualize on this portable study. Right basilar opacity, likely atelectasis. No visible effusions. IMPRESSION: Right chest tube in place. Lucency over the lateral and upper right hemithorax likely reflects pneumothorax, but pleural edge is difficult to visualize. Left upper lobe mass again noted. Electronically Signed   By: Rolm Baptise M.D.   On: 04/24/2021 18:06   CT HEAD WO CONTRAST (5MM)  Result Date: 04/22/2021 CLINICAL DATA:  Worsening headaches.  Dizziness and lightheaded. EXAM: CT HEAD WITHOUT CONTRAST TECHNIQUE: Contiguous axial images were obtained from the base of the skull through the vertex without intravenous contrast. COMPARISON:  Brain MRI 07/15/2018 FINDINGS: Brain: No abnormality seen affecting the brainstem or cerebellum. There are 2 separate areas of vasogenic edema, 1 within the inferior left frontal region in the other at the left parietal vertex. There are presumed underlying masses in both of those areas, though they are not clearly depicted. No evidence of hemorrhage. No left-to-right shift. No abnormality seen in the right hemisphere. No extra-axial fluid collection. The differential diagnosis includes venous infarctions and cerebritis. MRI with contrast strongly recommended. Vascular: No abnormal vascular finding. Skull: Normal Sinuses/Orbits: Clear/normal Other: None IMPRESSION: Two separate areas of vasogenic edema within the left hemisphere, 1 within the inferior frontal region in the other at the left parietal vertex. Metastatic disease would be the most likely cause of this appearance. Other possibilities include venous infarctions and cerebritis. MRI with contrast recommended. Call report in progress. Electronically Signed   By: Nelson Chimes M.D.   On: 04/22/2021 16:07   CT Chest W Contrast  Result Date: 04/23/2021 CLINICAL DATA:  Metastatic disease evaluation. Brain  metastases on MRI. EXAM: CT CHEST, ABDOMEN, AND PELVIS WITH CONTRAST TECHNIQUE: Multidetector CT imaging of the chest, abdomen and pelvis was performed following the standard protocol during bolus administration of intravenous contrast. CONTRAST:  44mL OMNIPAQUE IOHEXOL 300 MG/ML  SOLN COMPARISON:  05/25/2015 FINDINGS: CT CHEST FINDINGS Cardiovascular: Heart is normal size. Aorta is normal caliber. Mediastinum/Nodes: No mediastinal, hilar, or axillary adenopathy. Trachea and esophagus are unremarkable. Thyroid unremarkable. Lungs/Pleura: 4.5 cm left upper lobe spiculated mass compatible with primary lung cancer. No pleural effusions. This abuts the medial pleural surface and mediastinum. Few small adjacent pre-vascular lymph nodes, not pathologically enlarged based on CT size criteria. Musculoskeletal: Chest wall soft tissues are unremarkable. No acute bony abnormality. CT ABDOMEN PELVIS FINDINGS Hepatobiliary: Insert paddle biliary Pancreas: No focal abnormality or ductal dilatation. Spleen: No focal abnormality.  Normal size. Adrenals/Urinary Tract: No adrenal abnormality. No focal renal abnormality. No stones or hydronephrosis. Urinary bladder is unremarkable. Stomach/Bowel: Stomach, large and small bowel grossly unremarkable. Vascular/Lymphatic: No evidence of aneurysm or adenopathy. Reproductive: Prior hysterectomy.  No adnexal masses. Other: No free fluid or free air. Musculoskeletal: No acute bony abnormality. IMPRESSION: 4.5 cm spiculated left upper lobe mass most compatible with primary lung cancer. This abuts the medial pleural surface. A few small adjacent pre-vascular mediastinal lymph nodes, none pathologically enlarged. Recommend cardiothoracic surgical and oncologic consultation. No acute findings or evidence of metastatic disease in the abdomen or pelvis. Electronically Signed   By: Rolm Baptise M.D.   On: 04/23/2021 22:38   MR BRAIN W WO CONTRAST  Result Date: 04/26/2021 CLINICAL DATA:   Metastatic disease to brain.  Lung mass. EXAM: MRI HEAD WITHOUT AND WITH CONTRAST TECHNIQUE: Multiplanar, multiecho pulse sequences of the brain and surrounding structures were obtained without and with intravenous contrast. CONTRAST:  20mL GADAVIST GADOBUTROL 1 MMOL/ML IV SOLN COMPARISON:  MRI head 04/23/2021 FINDINGS: Brain: Multiple enhancing lesions the brain compatible with metastatic disease. Cystic ring-enhancing mass in the left inferior frontal lobe measures 25 x 18 mm with moderate surrounding vasogenic edema. 12.5 mm near solid enhancing mass lesion in the left posterior parietal lobe with moderate surrounding edema. 4 mm enhancing lesion right posterior temporal lobe without significant edema. 4 mm right medial frontal lesion over the convexity. Series 1100 image 248 3 mm enhancing lesion left  medial frontal cortex. Series 1100, image 248 Question small 2-3 mm enhancing lesion right inferior frontal lobe. This is more convincing on the prior study. This is adjacent to the orbit but is likely a metastatic deposit. Ventricle size normal. Mild mass-effect on the left frontal horn due to edema. Negative for acute or chronic cerebral ischemia. Negative for intracranial hemorrhage. Vascular: Normal arterial flow voids at the skull base. Skull and upper cervical spine: Negative Sinuses/Orbits: Negative Other: None IMPRESSION: Metastatic disease to the brain as described above and unchanged from the recent study of 04/23/2021. There is edema in the left frontal lobe and left parietal lobe. No areas of hemorrhage. Electronically Signed   By: Franchot Gallo M.D.   On: 04/26/2021 15:12   MR Brain W and Wo Contrast  Result Date: 04/23/2021 CLINICAL DATA:  55 year old female with increasing headaches. Multifocal vasogenic edema in the brain on plain head CT yesterday. EXAM: MRI HEAD WITHOUT AND WITH CONTRAST TECHNIQUE: Multiplanar, multiecho pulse sequences of the brain and surrounding structures were obtained  without and with intravenous contrast. CONTRAST:  42mL GADAVIST GADOBUTROL 1 MMOL/ML IV SOLN COMPARISON:  Head CT 04/22/2021. Brain MRI without contrast 07/15/2018. FINDINGS: Brain: Solid enhancing 16 mm mass in the posterosuperior left parietal lobe with moderate regional vasogenic edema (series 18, image 41 and series 20, image 18). Only mild regional mass effect. Large up to 29 mm rim enhancing cystic or necrotic mass in the left hemisphere at the inferior frontal gyrus junction with the temporal lobe (series 18, image 22 and series 19, image 21. Moderate vasogenic edema in the region but only mild regional mass effect - including on the anterior left temporal tip. Punctate 2-3 mm abnormal enhancement in the posterior right temporal lobe seen on both series 20, image 6 and series 19, image 14. Subtle similar right inferior frontal gyrus lesion best seen on series 18, image 24 and series 20, image 13. No associated edema. And suspicion of 2 similar small nodular areas of enhancement in the superior frontal gyri abutting the midline. See series 18, image 46 and series 19, images 19 and 17. Less likely these could be dural based. No edema. No dural thickening or enhancement elsewhere. No superimposed restricted diffusion to suggest acute infarction. No midline shift, ventriculomegaly, extra-axial collection or acute intracranial hemorrhage. Cervicomedullary junction and pituitary are within normal limits. Normal background gray and white matter signal. No cortical encephalomalacia or chronic cerebral blood products. Vascular: Major intracranial vascular flow voids in the are stable since 2020. Dominant left vertebral artery. The major dural venous sinuses are enhancing and appear to be patent. Skull and upper cervical spine: Negative. Visualized bone marrow signal is within normal limits. Sinuses/Orbits: Negative. Other: Trace right mastoid fluid. Negative nasopharynx. Negative visible scalp and face. IMPRESSION: 1.  Metastatic disease to the brain. Two enhancing brain masses with moderate associated vasogenic edema: solid 16 mm left parietal lobe mass, and a larger 29 mm rim enhancing cystic or necrotic mass in the left inferior frontal gyrus. Two other small 2-3 mm metastases in the right inferior frontal gyrus, right posterior temporal lobe. And questionable two additional punctate metastases in both superior frontal gyri abutting the falx (series 18, image 46). 2. No significant intracranial mass effect. No other intracranial abnormality. Electronically Signed   By: Genevie Ann M.D.   On: 04/23/2021 06:41   CT Abdomen Pelvis W Contrast  Result Date: 04/23/2021 CLINICAL DATA:  Metastatic disease evaluation. Brain metastases on MRI. EXAM: CT CHEST, ABDOMEN,  AND PELVIS WITH CONTRAST TECHNIQUE: Multidetector CT imaging of the chest, abdomen and pelvis was performed following the standard protocol during bolus administration of intravenous contrast. CONTRAST:  61mL OMNIPAQUE IOHEXOL 300 MG/ML  SOLN COMPARISON:  05/25/2015 FINDINGS: CT CHEST FINDINGS Cardiovascular: Heart is normal size. Aorta is normal caliber. Mediastinum/Nodes: No mediastinal, hilar, or axillary adenopathy. Trachea and esophagus are unremarkable. Thyroid unremarkable. Lungs/Pleura: 4.5 cm left upper lobe spiculated mass compatible with primary lung cancer. No pleural effusions. This abuts the medial pleural surface and mediastinum. Few small adjacent pre-vascular lymph nodes, not pathologically enlarged based on CT size criteria. Musculoskeletal: Chest wall soft tissues are unremarkable. No acute bony abnormality. CT ABDOMEN PELVIS FINDINGS Hepatobiliary: Insert paddle biliary Pancreas: No focal abnormality or ductal dilatation. Spleen: No focal abnormality.  Normal size. Adrenals/Urinary Tract: No adrenal abnormality. No focal renal abnormality. No stones or hydronephrosis. Urinary bladder is unremarkable. Stomach/Bowel: Stomach, large and small bowel grossly  unremarkable. Vascular/Lymphatic: No evidence of aneurysm or adenopathy. Reproductive: Prior hysterectomy.  No adnexal masses. Other: No free fluid or free air. Musculoskeletal: No acute bony abnormality. IMPRESSION: 4.5 cm spiculated left upper lobe mass most compatible with primary lung cancer. This abuts the medial pleural surface. A few small adjacent pre-vascular mediastinal lymph nodes, none pathologically enlarged. Recommend cardiothoracic surgical and oncologic consultation. No acute findings or evidence of metastatic disease in the abdomen or pelvis. Electronically Signed   By: Rolm Baptise M.D.   On: 04/23/2021 22:38   DG CHEST PORT 1 VIEW  Result Date: 04/27/2021 CLINICAL DATA:  Pneumothorax.  Chest tube EXAM: PORTABLE CHEST 1 VIEW COMPARISON:  04/26/2021 FINDINGS: Pigtail chest tube on the right unchanged. Minimal right apical pneumothorax has improved. Left upper lobe mass density unchanged. Lungs are well aerated and clear.  No infiltrate or effusion. IMPRESSION: Improvement in right apical pneumothorax which is now minimal. Left upper lobe mass lesion unchanged. Electronically Signed   By: Franchot Gallo M.D.   On: 04/27/2021 12:06   DG CHEST PORT 1 VIEW  Result Date: 04/26/2021 CLINICAL DATA:  Pneumothorax, chest pain EXAM: PORTABLE CHEST 1 VIEW COMPARISON:  Previous studies including the examination of 04/25/2021 FINDINGS: There is interval increase in size of right apical pneumothorax which measures approximately 2 cm in the current study. Tip of right chest tube is noted in the right mid lung fields with possible cephalad migration. Alveolar infiltrate/mass in the left upper lung fields has not changed. This may suggest pneumonia and/or neoplastic process. Costophrenic angles are clear. IMPRESSION: There is interval increase in size of right apical pneumothorax measuring 2 cm. Right lung remains well expanded. There is no shift of mediastinum. Alveolar density in the left upper lung fields  has not changed significantly Electronically Signed   By: Elmer Picker M.D.   On: 04/26/2021 10:00   DG Chest Port 1 View  Result Date: 04/25/2021 CLINICAL DATA:  Chest tube, pneumothorax EXAM: PORTABLE CHEST 1 VIEW COMPARISON:  Chest x-ray 04/24/2021 FINDINGS: Right-sided chest tube in place. Interval expansion of the right lung with persistent small pneumothorax measuring approximately 12 mm from the apex. Stable masslike opacity in the left upper lung zone. Cardiomediastinal silhouette is unchanged. No significant pleural effusion visualized. IMPRESSION: Small right pneumothorax, improved since previous study. Electronically Signed   By: Ofilia Neas M.D.   On: 04/25/2021 08:48   DG CHEST PORT 1 VIEW  Result Date: 04/24/2021 CLINICAL DATA:  Follow-up pneumothorax. EXAM: PORTABLE CHEST 1 VIEW COMPARISON:  1-1/2 hours ago. FINDINGS: Pigtail catheter  projects over the right mid lower lung zone. Unchanged size of moderate right pneumothorax. Pleural edge is at least 3 cm from the lung apex. There is increasing opacity in the medial right lung base. No mediastinal shift. Low lung volumes with masslike opacity in the left upper lobe unchanged. Otherwise unchanged exam. IMPRESSION: 1. Unchanged size of moderate right pneumothorax. Right chest tube remains in place. 2. Increasing opacity in the medial right lung base, likely atelectasis. 3. Unchanged masslike opacity in the left upper lobe. Electronically Signed   By: Keith Rake M.D.   On: 04/24/2021 19:23   EEG adult  Result Date: 04/24/2021 Derek Jack, MD     04/24/2021  2:05 PM Routine EEG Report XELA OREGEL is a 55 y.o. female with a history of presyncope who is undergoing an EEG to evaluate for seizures. Report: This EEG was acquired with electrodes placed according to the International 10-20 electrode system (including Fp1, Fp2, F3, F4, C3, C4, P3, P4, O1, O2, T3, T4, T5, T6, A1, A2, Fz, Cz, Pz). The following electrodes were  missing or displaced: none. The occipital dominant rhythm was 9 Hz. This activity is reactive to stimulation. Drowsiness was manifested by background fragmentation; deeper stages of sleep were not identified. There was intermittent focal slowing over the left posterior region. There were no interictal epileptiform discharges. There were no electrographic seizures identified. Photic stimulation and hyperventilation were not performed. Impression and clinical correlation: This EEG was obtained while awake and drowsy and is abnormal due to intermittent focal slowing over the left posterior region, indicative of focal cerebral dysfunction in that area. There were no electrographic seizures observed during this recording. Su Monks, MD [facility] [phone] If 7pm- 7am, please page neurology on call as listed in [place].   DG C-ARM BRONCHOSCOPY  Result Date: 04/24/2021 C-ARM BRONCHOSCOPY: Fluoroscopy was utilized by the requesting physician.  No radiographic interpretation.

## 2021-04-28 ENCOUNTER — Inpatient Hospital Stay (HOSPITAL_COMMUNITY): Payer: BC Managed Care – PPO

## 2021-04-28 ENCOUNTER — Encounter (HOSPITAL_COMMUNITY): Payer: Self-pay | Admitting: Pulmonary Disease

## 2021-04-28 DIAGNOSIS — G936 Cerebral edema: Secondary | ICD-10-CM | POA: Diagnosis not present

## 2021-04-28 DIAGNOSIS — C7931 Secondary malignant neoplasm of brain: Secondary | ICD-10-CM | POA: Diagnosis not present

## 2021-04-28 DIAGNOSIS — J939 Pneumothorax, unspecified: Secondary | ICD-10-CM | POA: Diagnosis not present

## 2021-04-28 LAB — CBC WITH DIFFERENTIAL/PLATELET
Abs Immature Granulocytes: 0.08 10*3/uL — ABNORMAL HIGH (ref 0.00–0.07)
Basophils Absolute: 0 10*3/uL (ref 0.0–0.1)
Basophils Relative: 0 %
Eosinophils Absolute: 0 10*3/uL (ref 0.0–0.5)
Eosinophils Relative: 0 %
HCT: 35.6 % — ABNORMAL LOW (ref 36.0–46.0)
Hemoglobin: 12 g/dL (ref 12.0–15.0)
Immature Granulocytes: 2 %
Lymphocytes Relative: 24 %
Lymphs Abs: 1.3 10*3/uL (ref 0.7–4.0)
MCH: 29.3 pg (ref 26.0–34.0)
MCHC: 33.7 g/dL (ref 30.0–36.0)
MCV: 86.8 fL (ref 80.0–100.0)
Monocytes Absolute: 0.3 10*3/uL (ref 0.1–1.0)
Monocytes Relative: 6 %
Neutro Abs: 3.8 10*3/uL (ref 1.7–7.7)
Neutrophils Relative %: 68 %
Platelets: 210 10*3/uL (ref 150–400)
RBC: 4.1 MIL/uL (ref 3.87–5.11)
RDW: 12.1 % (ref 11.5–15.5)
WBC: 5.5 10*3/uL (ref 4.0–10.5)
nRBC: 0 % (ref 0.0–0.2)

## 2021-04-28 LAB — COMPREHENSIVE METABOLIC PANEL
ALT: 15 U/L (ref 0–44)
AST: 14 U/L — ABNORMAL LOW (ref 15–41)
Albumin: 3 g/dL — ABNORMAL LOW (ref 3.5–5.0)
Alkaline Phosphatase: 51 U/L (ref 38–126)
Anion gap: 9 (ref 5–15)
BUN: 19 mg/dL (ref 6–20)
CO2: 21 mmol/L — ABNORMAL LOW (ref 22–32)
Calcium: 8.8 mg/dL — ABNORMAL LOW (ref 8.9–10.3)
Chloride: 103 mmol/L (ref 98–111)
Creatinine, Ser: 0.85 mg/dL (ref 0.44–1.00)
GFR, Estimated: >60 mL/min (ref >60)
Glucose, Bld: 160 mg/dL — ABNORMAL HIGH (ref 70–99)
Potassium: 4.3 mmol/L (ref 3.5–5.1)
Sodium: 133 mmol/L — ABNORMAL LOW (ref 135–145)
Total Bilirubin: 0.7 mg/dL (ref 0.3–1.2)
Total Protein: 6.1 g/dL — ABNORMAL LOW (ref 6.5–8.1)

## 2021-04-28 LAB — MAGNESIUM: Magnesium: 2 mg/dL (ref 1.7–2.4)

## 2021-04-28 LAB — GLUCOSE, CAPILLARY
Glucose-Capillary: 153 mg/dL — ABNORMAL HIGH (ref 70–99)
Glucose-Capillary: 139 mg/dL — ABNORMAL HIGH (ref 70–99)
Glucose-Capillary: 127 mg/dL — ABNORMAL HIGH (ref 70–99)
Glucose-Capillary: 123 mg/dL — ABNORMAL HIGH (ref 70–99)

## 2021-04-28 MED ORDER — DEXAMETHASONE SODIUM PHOSPHATE 4 MG/ML IJ SOLN
4.0000 mg | Freq: Two times a day (BID) | INTRAMUSCULAR | Status: DC
  Administered 2021-04-28 – 2021-04-30 (×4): 4 mg via INTRAVENOUS
  Filled 2021-04-28 (×5): qty 1

## 2021-04-28 MED ORDER — SULFAMETHOXAZOLE-TRIMETHOPRIM 800-160 MG PO TABS
1.0000 | ORAL_TABLET | ORAL | Status: DC
  Administered 2021-04-29: 1 via ORAL
  Filled 2021-04-28: qty 1

## 2021-04-28 NOTE — Progress Notes (Signed)
PROGRESS NOTE                                                                                                                                                                                                             Patient Demographics:    Abigail Golden, is a 55 y.o. female, DOB - 1966/03/10, IRJ:[id number]  Outpatient Primary MD for the patient is Aletha Halim., PA-C    LOS - 4  Admit date - 04/23/2021    No chief complaint on file.      Brief Narrative (HPI from H&P)    Abigail Golden is a 55 y.o. female with history of migraine and allergies presents to the ER after outpatient CT scan showing a mass, MRI brain here confirmed brain mass along further work-up also suggests primary lung cancer with mets to the brain.  She was admitted for further work-up.  She had a biopsy of left upper lobe mass via bronchoscopy 04/24/2021, she has right lung Pneumothorax after procedure, where she required chest tube.   Subjective:    Abigail Golden today reports good appetite, reports bowel movement, reports pain at chest tube insertion site is controlled.      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 -work-up suspicious for primary metastatic lung cancer, initial biopsy results none small cell carcinoma (likely adenocarcinoma as she is non-smoker) . -Continue with IV steroids for brain mets.  Is been decreased to twice daily. -Neurosurgery consulted. -Oncology input greatly appreciated.   -Radiation oncology appreciated, he likely will need stereotactic radiosurgery -PET scheduled for Tuesday    Iatrogenic right pneumothorax -Management per PCCM -Chest tube care per PCCM. -Plan to clamp chest tube today, and repeat chest x-ray in the afternoon, no recurrent pneumothorax, likely will DC chest  tube later today.   Morbid obesity with BMI of 50.  Follow with PCP.  History of allergies and migraines.  Supportive care.      Condition - Extremely Guarded  Family Communication  :  none at bedside  Code Status :  Full  Consults  : PCCM, Oncology,neurosurgery   Procedures:  -Chest tube/bronchoscopy by [facility] 04/24/2021    Procedures  :     CT  Chest - Abd and Pelvis - 4.5 cm spiculated left upper lobe mass most compatible with primary lung cancer. This abuts the medial pleural surface. A few small adjacent pre-vascular mediastinal lymph nodes, none pathologically enlarged. Recommend cardiothoracic surgical and oncologic consultation. No acute findings or evidence of metastatic disease in the abdomen or pelvis  MRI Brain  - 1. Metastatic disease to the brain. Two enhancing brain masses with moderate associated vasogenic edema: solid 16 mm left parietal lobe mass, and a larger 29 mm rim enhancing cystic or necrotic mass in the left inferior frontal gyrus. Two other small 2-3 mm metastases in the right inferior frontal gyrus, right posterior temporal lobe. And questionable two additional punctate metastases in both superior frontal gyri abutting the falx (series 18, image 46). 2. No significant intracranial mass effect. No other intracranial abnormality      Disposition Plan  :    Status is: inp  DVT Prophylaxis  :    heparin injection 5,000 Units Start: 04/25/21 1400 Place and maintain sequential compression device Start: 04/24/21 0755  Lab Results  Component Value Date   PLT 210 04/28/2021    Diet :  Diet Order             Diet heart healthy/carb modified Room service appropriate? Yes; Fluid consistency: Thin  Diet effective now                    Inpatient Medications  Scheduled Meds:  citalopram  10 mg Oral QHS   darifenacin  7.5 mg Oral QHS   dexamethasone (DECADRON) injection  4 mg Intravenous Q12H   diphenhydrAMINE  25 mg Oral QHS   heparin injection  (subcutaneous)  5,000 Units Subcutaneous Q8H   hydrOXYzine  25 mg Oral QHS   insulin aspart  0-5 Units Subcutaneous QHS   insulin aspart  0-9 Units Subcutaneous TID WC   loratadine  10 mg Oral Daily   montelukast  10 mg Oral QHS   polyethylene glycol  17 g Oral BID   senna-docusate  1 tablet Oral BID   sodium chloride flush  10 mL Other Q8H   [START ON 04/29/2021] sulfamethoxazole-trimethoprim  1 tablet Oral Once per day on Mon Wed Fri   Continuous Infusions:  lactated ringers 10 mL/hr at 04/24/21 1357   PRN Meds:.acetaminophen **OR** acetaminophen, albuterol, ALPRAZolam, diphenhydrAMINE, hydrOXYzine, morphine injection, oxyCODONE-acetaminophen  Antibiotics  :    Anti-infectives (From admission, onward)    Start     Dose/Rate Route Frequency Ordered Stop   04/29/21 0900  sulfamethoxazole-trimethoprim (BACTRIM DS) 800-160 MG per tablet 1 tablet        1 tablet Oral Once per day on Mon Wed Fri 04/28/21 1000           Abigail Golden M.D on 04/28/2021 at 1:09 PM  To page go to [email or web address]   [facility] -  Office  [phone]  See all Orders from today for further details    Objective:   Vitals:   04/27/21 1215 04/27/21 2147 04/28/21 0500 04/28/21 0543  BP: 128/63 136/62 129/70 (!) 167/91  Pulse: 67 66 (!) 52 89  Resp: 17     Temp: 97.9 F (36.6 C) 97.6 F (36.4 C) 97.8 F (36.6 C)   TempSrc:  Oral Oral   SpO2: 99% 97% 97% 90%  Weight:      Height:        Wt Readings from Last 3 Encounters:  04/24/21 127 kg  09/08/18 120.2  kg  02/27/17 122.5 kg    No intake or output data in the 24 hours ending 04/28/21 1309    Physical Exam  Awake Alert, Oriented X 3, No new F.N deficits, Normal affect Symmetrical Chest wall movement, Good air movement bilaterally, CTAB, pigtail at right upper chest RRR,No Gallops,Rubs or new Murmurs, No Parasternal Heave +ve B.Sounds, Abd Soft, No tenderness, No rebound - guarding or rigidity. No Cyanosis, Clubbing or  edema, No new Rash or bruise      Data Review:    CBC Recent Labs  Lab 04/24/21 0520 04/25/21 0316 04/26/21 0306 04/27/21 0426 04/28/21 0327  WBC 4.6 10.7* 7.5 6.7 5.5  HGB 13.0 12.7 11.3* 11.8* 12.0  HCT 37.8 37.4 33.7* 35.3* 35.6*  PLT 229 227 210 235 210  MCV 86.9 86.0 87.1 87.6 86.8  MCH 29.9 29.2 29.2 29.3 29.3  MCHC 34.4 34.0 33.5 33.4 33.7  RDW 12.6 12.7 12.8 12.5 12.1  LYMPHSABS 1.2 1.1 1.0 1.8 1.3  MONOABS 0.1 0.9 0.5 0.7 0.3  EOSABS 0.0 0.0 0.0 0.0 0.0  BASOSABS 0.0 0.0 0.0 0.0 0.0    Recent Labs  Lab 04/24/21 0520 04/24/21 0802 04/24/21 2212 04/25/21 0316 04/26/21 0306 04/27/21 0426 04/28/21 0327  NA 133*  --   --  135 135 134* 133*  K 4.3  --   --  4.2 4.4 4.5 4.3  CL 104  --   --  104 104 103 103  CO2 18*  --   --  23 24 25  21*  GLUCOSE 166*  --   --  173* 161* 122* 160*  BUN 10  --   --  15 16 17 19   CREATININE 0.77  --   --  1.19* 0.90 0.80 0.85  CALCIUM 8.9  --   --  8.9 8.8* 9.0 8.8*  AST 17  --   --  19 15 13* 14*  ALT 15  --   --  16 14 14 15   ALKPHOS 56  --   --  56 50 50 51  BILITOT 0.7  --   --  0.4 0.7 0.3 0.7  ALBUMIN 3.5  --   --  3.8 3.2* 3.3* 3.0*  MG  --   --   --  1.9 2.2 2.2 2.0  INR  --  1.1  --   --   --   --   --   HGBA1C  --   --  5.2  --   --   --   --   BNP  --   --   --  38.5 57.5 46.6  --     ------------------------------------------------------------------------------------------------------------------ No results for input(s): CHOL, HDL, LDLCALC, TRIG, CHOLHDL, LDLDIRECT in the last 72 hours.  Lab Results  Component Value Date   HGBA1C 5.2 04/24/2021   ------------------------------------------------------------------------------------------------------------------ No results for input(s): TSH, T4TOTAL, T3FREE, THYROIDAB in the last 72 hours.  Invalid input(s): FREET3  Cardiac Enzymes No results for input(s): CKMB, TROPONINI, MYOGLOBIN in the last 168 hours.  Invalid input(s):  CK ------------------------------------------------------------------------------------------------------------------    Component Value Date/Time   BNP 46.6 04/27/2021 0426     Radiology Reports DG Chest 1 View  Result Date: 04/24/2021 CLINICAL DATA:  Post bronchoscopy and right chest tube placement. Pneumothorax. EXAM: CHEST  1 VIEW COMPARISON:  Chest CT 04/23/2021 FINDINGS: Left upper lobe mass noted as seen on prior CT. Right chest tube in place. Lucency noted over the right lateral chest and right apex  concerning for pneumothorax, but the pleural edge is difficult to visualize on this portable study. Right basilar opacity, likely atelectasis. No visible effusions. IMPRESSION: Right chest tube in place. Lucency over the lateral and upper right hemithorax likely reflects pneumothorax, but pleural edge is difficult to visualize. Left upper lobe mass again noted. Electronically Signed   By: Rolm Baptise M.D.   On: 04/24/2021 18:06   CT HEAD WO CONTRAST (5MM)  Result Date: 04/22/2021 CLINICAL DATA:  Worsening headaches.  Dizziness and lightheaded. EXAM: CT HEAD WITHOUT CONTRAST TECHNIQUE: Contiguous axial images were obtained from the base of the skull through the vertex without intravenous contrast. COMPARISON:  Brain MRI 07/15/2018 FINDINGS: Brain: No abnormality seen affecting the brainstem or cerebellum. There are 2 separate areas of vasogenic edema, 1 within the inferior left frontal region in the other at the left parietal vertex. There are presumed underlying masses in both of those areas, though they are not clearly depicted. No evidence of hemorrhage. No left-to-right shift. No abnormality seen in the right hemisphere. No extra-axial fluid collection. The differential diagnosis includes venous infarctions and cerebritis. MRI with contrast strongly recommended. Vascular: No abnormal vascular finding. Skull: Normal Sinuses/Orbits: Clear/normal Other: None IMPRESSION: Two separate areas of  vasogenic edema within the left hemisphere, 1 within the inferior frontal region in the other at the left parietal vertex. Metastatic disease would be the most likely cause of this appearance. Other possibilities include venous infarctions and cerebritis. MRI with contrast recommended. Call report in progress. Electronically Signed   By: Nelson Chimes M.D.   On: 04/22/2021 16:07   CT Chest W Contrast  Result Date: 04/23/2021 CLINICAL DATA:  Metastatic disease evaluation. Brain metastases on MRI. EXAM: CT CHEST, ABDOMEN, AND PELVIS WITH CONTRAST TECHNIQUE: Multidetector CT imaging of the chest, abdomen and pelvis was performed following the standard protocol during bolus administration of intravenous contrast. CONTRAST:  29mL OMNIPAQUE IOHEXOL 300 MG/ML  SOLN COMPARISON:  05/25/2015 FINDINGS: CT CHEST FINDINGS Cardiovascular: Heart is normal size. Aorta is normal caliber. Mediastinum/Nodes: No mediastinal, hilar, or axillary adenopathy. Trachea and esophagus are unremarkable. Thyroid unremarkable. Lungs/Pleura: 4.5 cm left upper lobe spiculated mass compatible with primary lung cancer. No pleural effusions. This abuts the medial pleural surface and mediastinum. Few small adjacent pre-vascular lymph nodes, not pathologically enlarged based on CT size criteria. Musculoskeletal: Chest wall soft tissues are unremarkable. No acute bony abnormality. CT ABDOMEN PELVIS FINDINGS Hepatobiliary: Insert paddle biliary Pancreas: No focal abnormality or ductal dilatation. Spleen: No focal abnormality.  Normal size. Adrenals/Urinary Tract: No adrenal abnormality. No focal renal abnormality. No stones or hydronephrosis. Urinary bladder is unremarkable. Stomach/Bowel: Stomach, large and small bowel grossly unremarkable. Vascular/Lymphatic: No evidence of aneurysm or adenopathy. Reproductive: Prior hysterectomy.  No adnexal masses. Other: No free fluid or free air. Musculoskeletal: No acute bony abnormality. IMPRESSION: 4.5 cm  spiculated left upper lobe mass most compatible with primary lung cancer. This abuts the medial pleural surface. A few small adjacent pre-vascular mediastinal lymph nodes, none pathologically enlarged. Recommend cardiothoracic surgical and oncologic consultation. No acute findings or evidence of metastatic disease in the abdomen or pelvis. Electronically Signed   By: Rolm Baptise M.D.   On: 04/23/2021 22:38   MR BRAIN W WO CONTRAST  Result Date: 04/26/2021 CLINICAL DATA:  Metastatic disease to brain.  Lung mass. EXAM: MRI HEAD WITHOUT AND WITH CONTRAST TECHNIQUE: Multiplanar, multiecho pulse sequences of the brain and surrounding structures were obtained without and with intravenous contrast. CONTRAST:  37mL GADAVIST GADOBUTROL 1  MMOL/ML IV SOLN COMPARISON:  MRI head 04/23/2021 FINDINGS: Brain: Multiple enhancing lesions the brain compatible with metastatic disease. Cystic ring-enhancing mass in the left inferior frontal lobe measures 25 x 18 mm with moderate surrounding vasogenic edema. 12.5 mm near solid enhancing mass lesion in the left posterior parietal lobe with moderate surrounding edema. 4 mm enhancing lesion right posterior temporal lobe without significant edema. 4 mm right medial frontal lesion over the convexity. Series 1100 image 248 3 mm enhancing lesion left medial frontal cortex. Series 1100, image 248 Question small 2-3 mm enhancing lesion right inferior frontal lobe. This is more convincing on the prior study. This is adjacent to the orbit but is likely a metastatic deposit. Ventricle size normal. Mild mass-effect on the left frontal horn due to edema. Negative for acute or chronic cerebral ischemia. Negative for intracranial hemorrhage. Vascular: Normal arterial flow voids at the skull base. Skull and upper cervical spine: Negative Sinuses/Orbits: Negative Other: None IMPRESSION: Metastatic disease to the brain as described above and unchanged from the recent study of 04/23/2021. There is  edema in the left frontal lobe and left parietal lobe. No areas of hemorrhage. Electronically Signed   By: Franchot Gallo M.D.   On: 04/26/2021 15:12   MR Brain W and Wo Contrast  Result Date: 04/23/2021 CLINICAL DATA:  55 year old female with increasing headaches. Multifocal vasogenic edema in the brain on plain head CT yesterday. EXAM: MRI HEAD WITHOUT AND WITH CONTRAST TECHNIQUE: Multiplanar, multiecho pulse sequences of the brain and surrounding structures were obtained without and with intravenous contrast. CONTRAST:  20mL GADAVIST GADOBUTROL 1 MMOL/ML IV SOLN COMPARISON:  Head CT 04/22/2021. Brain MRI without contrast 07/15/2018. FINDINGS: Brain: Solid enhancing 16 mm mass in the posterosuperior left parietal lobe with moderate regional vasogenic edema (series 18, image 41 and series 20, image 18). Only mild regional mass effect. Large up to 29 mm rim enhancing cystic or necrotic mass in the left hemisphere at the inferior frontal gyrus junction with the temporal lobe (series 18, image 22 and series 19, image 21. Moderate vasogenic edema in the region but only mild regional mass effect - including on the anterior left temporal tip. Punctate 2-3 mm abnormal enhancement in the posterior right temporal lobe seen on both series 20, image 6 and series 19, image 14. Subtle similar right inferior frontal gyrus lesion best seen on series 18, image 24 and series 20, image 13. No associated edema. And suspicion of 2 similar small nodular areas of enhancement in the superior frontal gyri abutting the midline. See series 18, image 46 and series 19, images 19 and 17. Less likely these could be dural based. No edema. No dural thickening or enhancement elsewhere. No superimposed restricted diffusion to suggest acute infarction. No midline shift, ventriculomegaly, extra-axial collection or acute intracranial hemorrhage. Cervicomedullary junction and pituitary are within normal limits. Normal background gray and white  matter signal. No cortical encephalomalacia or chronic cerebral blood products. Vascular: Major intracranial vascular flow voids in the are stable since 2020. Dominant left vertebral artery. The major dural venous sinuses are enhancing and appear to be patent. Skull and upper cervical spine: Negative. Visualized bone marrow signal is within normal limits. Sinuses/Orbits: Negative. Other: Trace right mastoid fluid. Negative nasopharynx. Negative visible scalp and face. IMPRESSION: 1. Metastatic disease to the brain. Two enhancing brain masses with moderate associated vasogenic edema: solid 16 mm left parietal lobe mass, and a larger 29 mm rim enhancing cystic or necrotic mass in the left inferior frontal  gyrus. Two other small 2-3 mm metastases in the right inferior frontal gyrus, right posterior temporal lobe. And questionable two additional punctate metastases in both superior frontal gyri abutting the falx (series 18, image 46). 2. No significant intracranial mass effect. No other intracranial abnormality. Electronically Signed   By: Genevie Ann M.D.   On: 04/23/2021 06:41   CT Abdomen Pelvis W Contrast  Result Date: 04/23/2021 CLINICAL DATA:  Metastatic disease evaluation. Brain metastases on MRI. EXAM: CT CHEST, ABDOMEN, AND PELVIS WITH CONTRAST TECHNIQUE: Multidetector CT imaging of the chest, abdomen and pelvis was performed following the standard protocol during bolus administration of intravenous contrast. CONTRAST:  21mL OMNIPAQUE IOHEXOL 300 MG/ML  SOLN COMPARISON:  05/25/2015 FINDINGS: CT CHEST FINDINGS Cardiovascular: Heart is normal size. Aorta is normal caliber. Mediastinum/Nodes: No mediastinal, hilar, or axillary adenopathy. Trachea and esophagus are unremarkable. Thyroid unremarkable. Lungs/Pleura: 4.5 cm left upper lobe spiculated mass compatible with primary lung cancer. No pleural effusions. This abuts the medial pleural surface and mediastinum. Few small adjacent pre-vascular lymph nodes, not  pathologically enlarged based on CT size criteria. Musculoskeletal: Chest wall soft tissues are unremarkable. No acute bony abnormality. CT ABDOMEN PELVIS FINDINGS Hepatobiliary: Insert paddle biliary Pancreas: No focal abnormality or ductal dilatation. Spleen: No focal abnormality.  Normal size. Adrenals/Urinary Tract: No adrenal abnormality. No focal renal abnormality. No stones or hydronephrosis. Urinary bladder is unremarkable. Stomach/Bowel: Stomach, large and small bowel grossly unremarkable. Vascular/Lymphatic: No evidence of aneurysm or adenopathy. Reproductive: Prior hysterectomy.  No adnexal masses. Other: No free fluid or free air. Musculoskeletal: No acute bony abnormality. IMPRESSION: 4.5 cm spiculated left upper lobe mass most compatible with primary lung cancer. This abuts the medial pleural surface. A few small adjacent pre-vascular mediastinal lymph nodes, none pathologically enlarged. Recommend cardiothoracic surgical and oncologic consultation. No acute findings or evidence of metastatic disease in the abdomen or pelvis. Electronically Signed   By: Rolm Baptise M.D.   On: 04/23/2021 22:38   DG CHEST PORT 1 VIEW  Result Date: 04/27/2021 CLINICAL DATA:  Pneumothorax.  Chest tube EXAM: PORTABLE CHEST 1 VIEW COMPARISON:  04/26/2021 FINDINGS: Pigtail chest tube on the right unchanged. Minimal right apical pneumothorax has improved. Left upper lobe mass density unchanged. Lungs are well aerated and clear.  No infiltrate or effusion. IMPRESSION: Improvement in right apical pneumothorax which is now minimal. Left upper lobe mass lesion unchanged. Electronically Signed   By: Franchot Gallo M.D.   On: 04/27/2021 12:06   DG CHEST PORT 1 VIEW  Result Date: 04/26/2021 CLINICAL DATA:  Pneumothorax, chest pain EXAM: PORTABLE CHEST 1 VIEW COMPARISON:  Previous studies including the examination of 04/25/2021 FINDINGS: There is interval increase in size of right apical pneumothorax which measures  approximately 2 cm in the current study. Tip of right chest tube is noted in the right mid lung fields with possible cephalad migration. Alveolar infiltrate/mass in the left upper lung fields has not changed. This may suggest pneumonia and/or neoplastic process. Costophrenic angles are clear. IMPRESSION: There is interval increase in size of right apical pneumothorax measuring 2 cm. Right lung remains well expanded. There is no shift of mediastinum. Alveolar density in the left upper lung fields has not changed significantly Electronically Signed   By: Elmer Picker M.D.   On: 04/26/2021 10:00   DG Chest Port 1 View  Result Date: 04/25/2021 CLINICAL DATA:  Chest tube, pneumothorax EXAM: PORTABLE CHEST 1 VIEW COMPARISON:  Chest x-ray 04/24/2021 FINDINGS: Right-sided chest tube in place. Interval expansion  of the right lung with persistent small pneumothorax measuring approximately 12 mm from the apex. Stable masslike opacity in the left upper lung zone. Cardiomediastinal silhouette is unchanged. No significant pleural effusion visualized. IMPRESSION: Small right pneumothorax, improved since previous study. Electronically Signed   By: Ofilia Neas M.D.   On: 04/25/2021 08:48   DG CHEST PORT 1 VIEW  Result Date: 04/24/2021 CLINICAL DATA:  Follow-up pneumothorax. EXAM: PORTABLE CHEST 1 VIEW COMPARISON:  1-1/2 hours ago. FINDINGS: Pigtail catheter projects over the right mid lower lung zone. Unchanged size of moderate right pneumothorax. Pleural edge is at least 3 cm from the lung apex. There is increasing opacity in the medial right lung base. No mediastinal shift. Low lung volumes with masslike opacity in the left upper lobe unchanged. Otherwise unchanged exam. IMPRESSION: 1. Unchanged size of moderate right pneumothorax. Right chest tube remains in place. 2. Increasing opacity in the medial right lung base, likely atelectasis. 3. Unchanged masslike opacity in the left upper lobe. Electronically  Signed   By: Keith Rake M.D.   On: 04/24/2021 19:23   EEG adult  Result Date: 04/24/2021 Derek Jack, MD     04/24/2021  2:05 PM Routine EEG Report ZYAIRA VEJAR is a 55 y.o. female with a history of presyncope who is undergoing an EEG to evaluate for seizures. Report: This EEG was acquired with electrodes placed according to the International 10-20 electrode system (including Fp1, Fp2, F3, F4, C3, C4, P3, P4, O1, O2, T3, T4, T5, T6, A1, A2, Fz, Cz, Pz). The following electrodes were missing or displaced: none. The occipital dominant rhythm was 9 Hz. This activity is reactive to stimulation. Drowsiness was manifested by background fragmentation; deeper stages of sleep were not identified. There was intermittent focal slowing over the left posterior region. There were no interictal epileptiform discharges. There were no electrographic seizures identified. Photic stimulation and hyperventilation were not performed. Impression and clinical correlation: This EEG was obtained while awake and drowsy and is abnormal due to intermittent focal slowing over the left posterior region, indicative of focal cerebral dysfunction in that area. There were no electrographic seizures observed during this recording. Su Monks, MD [facility] [phone] If 7pm- 7am, please page neurology on call as listed in [facility].   DG C-ARM BRONCHOSCOPY  Result Date: 04/24/2021 C-ARM BRONCHOSCOPY: Fluoroscopy was utilized by the requesting physician.  No radiographic interpretation.

## 2021-04-28 NOTE — Progress Notes (Signed)
CXR with pretty large PTX after clamping; on further review the CXR I thought was waterseal was actually yesterday's on suction so she may have been dropping even with waterseal.  Will hook back up to suction and see if expands again overnight.  If up tomorrow, try waterseal again.  She will need transport to [place] for PET on Tuesday.  She can do this on waterseal.  Will put in PET as inpatient order.  Patient updated.  Erskine Emery MD PCCM

## 2021-04-28 NOTE — Progress Notes (Addendum)
   NAME:  MARLAYSIA LENIG, MRN:  [id number], DOB:  March 18, 1966, LOS: 4 ADMISSION DATE:  04/23/2021, CONSULTATION DATE:  04/24/21 REFERRING MD:  Candiss Norse, CHIEF COMPLAINT:  headaches   History of Present Illness:  55 year old never smoker w/ hx of seasonal allergies, allergic bronchitis, childhood migraines presenting with 3-4 weeks of recurrent dizziness episodes associated with neuralgia-type pain across back of head.  Workup has revealed two brain metastases as well as a large LUL mass for which PCCM consulted.  No family hx of early cancers, no unusual exposures. Has hx of hysterectomy for fibroids.  Denies SOB, hemoptysis.  Has postnasal drip and dry cough which is chronic and associated with her seasonal allergies.  She has no focal weakness, trouble speaking, shaking or seizure-like events.  There is a question of if she is losing consciousness with these dizzy spells.  Dizzy spells are worse with exertion.  Works for school system.  Pertinent  Medical History   Past Medical History:  Diagnosis Date   Allergy    Anxiety    Chronic bronchitis ([place])    Depression    Headache    Hypoglycemia    Lower back pain    since fall ~ 2012 (05/25/2015)   Migraine    05/25/2015 "probably once/month"   Migraine headache    Pain in left hip    since fall ~ 2012 (05/25/2015)   Pneumonia "several times"     Significant Hospital Events: Including procedures, antibiotic start and stop dates in addition to other pertinent events   11/1-2 admitted, bronch, PTX, chest tube 04/26/2021 right chest tube still with airleak on waterseal  Interim History / Subjective:  No events, slept well.  Objective   Blood pressure (!) 167/91, pulse 89, temperature 97.8 F (36.6 C), temperature source Oral, resp. rate 17, height 5' (1.524 m), weight 127 kg, SpO2 90 %.       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

## 2021-04-29 ENCOUNTER — Inpatient Hospital Stay: Payer: BC Managed Care – PPO | Attending: Hematology

## 2021-04-29 ENCOUNTER — Inpatient Hospital Stay (HOSPITAL_COMMUNITY): Payer: BC Managed Care – PPO

## 2021-04-29 DIAGNOSIS — J939 Pneumothorax, unspecified: Secondary | ICD-10-CM | POA: Diagnosis not present

## 2021-04-29 DIAGNOSIS — J42 Unspecified chronic bronchitis: Secondary | ICD-10-CM | POA: Insufficient documentation

## 2021-04-29 DIAGNOSIS — C7931 Secondary malignant neoplasm of brain: Secondary | ICD-10-CM | POA: Diagnosis not present

## 2021-04-29 DIAGNOSIS — Z7952 Long term (current) use of systemic steroids: Secondary | ICD-10-CM | POA: Insufficient documentation

## 2021-04-29 DIAGNOSIS — Z79899 Other long term (current) drug therapy: Secondary | ICD-10-CM | POA: Insufficient documentation

## 2021-04-29 DIAGNOSIS — Z23 Encounter for immunization: Secondary | ICD-10-CM | POA: Insufficient documentation

## 2021-04-29 DIAGNOSIS — Z7982 Long term (current) use of aspirin: Secondary | ICD-10-CM | POA: Insufficient documentation

## 2021-04-29 DIAGNOSIS — C3412 Malignant neoplasm of upper lobe, left bronchus or lung: Secondary | ICD-10-CM | POA: Insufficient documentation

## 2021-04-29 DIAGNOSIS — F32A Depression, unspecified: Secondary | ICD-10-CM | POA: Insufficient documentation

## 2021-04-29 LAB — GLUCOSE, CAPILLARY
Glucose-Capillary: 122 mg/dL — ABNORMAL HIGH (ref 70–99)
Glucose-Capillary: 135 mg/dL — ABNORMAL HIGH (ref 70–99)
Glucose-Capillary: 116 mg/dL — ABNORMAL HIGH (ref 70–99)
Glucose-Capillary: 115 mg/dL — ABNORMAL HIGH (ref 70–99)

## 2021-04-29 NOTE — Progress Notes (Signed)
PROGRESS NOTE                                                                                                                                                                                                             Patient Demographics:    Abigail Golden, is a 55 y.o. female, DOB - 1965/09/30, XIH:[id number]  Outpatient Primary MD for the patient is Aletha Halim., PA-C    LOS - 5  Admit date - 04/23/2021    No chief complaint on file.      Brief Narrative (HPI from H&P)    Abigail Golden is a 55 y.o. female with history of migraine and allergies presents to the ER after outpatient CT scan showing a mass, MRI brain here confirmed brain mass along further work-up also suggests primary lung cancer with mets to the brain.  She was admitted for further work-up.  She had a biopsy of left upper lobe mass via bronchoscopy 04/24/2021, she has right lung Pneumothorax after procedure, where she required chest tube.  She was seen by  oncology, radiation oncology as well, plan for CRS on 11/8, so she will be transferred to [facility] overnight.   Subjective:    Abigail Golden today denies any dyspnea, reports good appetite, reports bowel movements after stool softener, report pain at the insertion site is controlled.     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 -work-up significant for primary metastatic lung cancer, initial biopsy results none small cell carcinoma (likely adenocarcinoma as she is non-smoker) . -Continue with IV steroids for brain mets.  Is been decreased to twice daily. -Neurosurgery consulted. -Oncology input greatly appreciated.   -Radiation oncology appreciated, she will need stereotactic radiosurgery, plan  to proceed with [facility] planning session tomorrow and treatment start Friday,  planning total of 3 brain treatments, Start Friday, finish next week. -PET scheduled for Tuesday, have discussed with oncology, this can be done outpatient as well if cannot be done during hospital stay.  Iatrogenic right pneumothorax -Management per PCCM -Chest tube care per PCCM. -Keep on waterseal today, PCCM will continue following at [facility] long hospital chest tube care.     Morbid obesity with BMI of 50.  Follow with PCP.  History of allergies and migraines.  Supportive care.      Condition - Extremely Guarded  Family Communication  :  none at bedside, but discussed with her friend Manuela Schwartz by the phone upon her request.  Code Status :  Full  Consults  : PCCM, Oncology,neurosurgery, radiation oncology  Procedures:  -Chest tube/bronchoscopy by [facility] 04/24/2021    Procedures  :     CT Chest - Abd and Pelvis - 4.5 cm spiculated left upper lobe mass most compatible with primary lung cancer. This abuts the medial pleural surface. A few small adjacent pre-vascular mediastinal lymph nodes, none pathologically enlarged. Recommend cardiothoracic surgical and oncologic consultation. No acute findings or evidence of metastatic disease in the abdomen or pelvis  MRI Brain  - 1. Metastatic disease to the brain. Two enhancing brain masses with moderate associated vasogenic edema: solid 16 mm left parietal lobe mass, and a larger 29 mm rim enhancing cystic or necrotic mass in the left inferior frontal gyrus. Two other small 2-3 mm metastases in the right inferior frontal gyrus, right posterior temporal lobe. And questionable two additional punctate metastases in both superior frontal gyri abutting the falx (series 18, image 46). 2. No significant intracranial mass effect. No other intracranial abnormality      Disposition Plan  :    Status is: inp  DVT Prophylaxis  :    heparin injection 5,000 Units Start: 04/25/21 1400 Place and maintain sequential compression device Start: 04/24/21  0755  Lab Results  Component Value Date   PLT 210 04/28/2021    Diet :  Diet Order             Diet heart healthy/carb modified Room service appropriate? Yes; Fluid consistency: Thin  Diet effective now                    Inpatient Medications  Scheduled Meds:  citalopram  10 mg Oral QHS   darifenacin  7.5 mg Oral QHS   dexamethasone (DECADRON) injection  4 mg Intravenous Q12H   diphenhydrAMINE  25 mg Oral QHS   heparin injection (subcutaneous)  5,000 Units Subcutaneous Q8H   hydrOXYzine  25 mg Oral QHS   insulin aspart  0-5 Units Subcutaneous QHS   insulin aspart  0-9 Units Subcutaneous TID WC   loratadine  10 mg Oral Daily   montelukast  10 mg Oral QHS   senna-docusate  1 tablet Oral BID   sodium chloride flush  10 mL Other Q8H   sulfamethoxazole-trimethoprim  1 tablet Oral Once per day on Mon Wed Fri   Continuous Infusions:  lactated ringers 10 mL/hr at 04/24/21 1357   PRN Meds:.acetaminophen **OR** acetaminophen, albuterol, ALPRAZolam, diphenhydrAMINE, hydrOXYzine, morphine injection, oxyCODONE-acetaminophen  Antibiotics  :    Anti-infectives (From admission, onward)    Start     Dose/Rate Route Frequency Ordered Stop   04/29/21 0900  sulfamethoxazole-trimethoprim (BACTRIM DS) 800-160 MG per tablet 1 tablet        1 tablet Oral Once per day on Mon Wed Fri 04/28/21 1000           Phillips Climes M.D on 04/29/2021 at 1:24 PM  To page go to [email or web address]   [facility] -  Office  [phone]  See all Orders from today for further details    Objective:   Vitals:   04/28/21 0543 04/28/21 2143 04/28/21 2144 04/29/21 0741  BP: (!) 167/91 127/70 127/70 (!) 155/80  Pulse: 89 70  66 (!) 58  Resp:  20  17  Temp:  98.1 F (36.7 C)  97.9 F (36.6 C)  TempSrc:  Oral    SpO2: 90% 100% 100% 100%  Weight:      Height:        Wt Readings from Last 3 Encounters:  04/24/21 127 kg  09/08/18 120.2 kg  02/27/17 122.5 kg     Intake/Output  Summary (Last 24 hours) at 04/29/2021 1324 Last data filed at 04/28/2021 2143 Gross per 24 hour  Intake 120 ml  Output --  Net 120 ml      Physical Exam  Awake Alert, Oriented X 3, No new F.N deficits, Normal affect Symmetrical Chest wall movement, Good air movement bilaterally, right chest tube RRR,No Gallops,Rubs or new Murmurs, No Parasternal Heave +ve B.Sounds, Abd Soft, No tenderness, No rebound - guarding or rigidity. No Cyanosis, Clubbing or edema, No new Rash or bruise      Data Review:    CBC Recent Labs  Lab 04/24/21 0520 04/25/21 0316 04/26/21 0306 04/27/21 0426 04/28/21 0327  WBC 4.6 10.7* 7.5 6.7 5.5  HGB 13.0 12.7 11.3* 11.8* 12.0  HCT 37.8 37.4 33.7* 35.3* 35.6*  PLT 229 227 210 235 210  MCV 86.9 86.0 87.1 87.6 86.8  MCH 29.9 29.2 29.2 29.3 29.3  MCHC 34.4 34.0 33.5 33.4 33.7  RDW 12.6 12.7 12.8 12.5 12.1  LYMPHSABS 1.2 1.1 1.0 1.8 1.3  MONOABS 0.1 0.9 0.5 0.7 0.3  EOSABS 0.0 0.0 0.0 0.0 0.0  BASOSABS 0.0 0.0 0.0 0.0 0.0    Recent Labs  Lab 04/24/21 0520 04/24/21 0802 04/24/21 2212 04/25/21 0316 04/26/21 0306 04/27/21 0426 04/28/21 0327  NA 133*  --   --  135 135 134* 133*  K 4.3  --   --  4.2 4.4 4.5 4.3  CL 104  --   --  104 104 103 103  CO2 18*  --   --  23 24 25  21*  GLUCOSE 166*  --   --  173* 161* 122* 160*  BUN 10  --   --  15 16 17 19   CREATININE 0.77  --   --  1.19* 0.90 0.80 0.85  CALCIUM 8.9  --   --  8.9 8.8* 9.0 8.8*  AST 17  --   --  19 15 13* 14*  ALT 15  --   --  16 14 14 15   ALKPHOS 56  --   --  56 50 50 51  BILITOT 0.7  --   --  0.4 0.7 0.3 0.7  ALBUMIN 3.5  --   --  3.8 3.2* 3.3* 3.0*  MG  --   --   --  1.9 2.2 2.2 2.0  INR  --  1.1  --   --   --   --   --   HGBA1C  --   --  5.2  --   --   --   --   BNP  --   --   --  38.5 57.5 46.6  --     ------------------------------------------------------------------------------------------------------------------ No results for input(s): CHOL, HDL, LDLCALC, TRIG, CHOLHDL,  LDLDIRECT in the last 72 hours.  Lab Results  Component Value Date   HGBA1C 5.2 04/24/2021   ------------------------------------------------------------------------------------------------------------------ No results for input(s): TSH, T4TOTAL, T3FREE, THYROIDAB in the last 72 hours.  Invalid input(s): FREET3  Cardiac Enzymes No results for input(s): CKMB, TROPONINI, MYOGLOBIN in the last 168 hours.  Invalid input(s): CK ------------------------------------------------------------------------------------------------------------------    Component Value Date/Time   BNP 46.6 04/27/2021 0426     Radiology Reports DG Chest 1 View  Result Date: 04/28/2021 CLINICAL DATA:  Pneumothorax. EXAM: CHEST  1 VIEW COMPARISON:  Radiograph 04/27/2021 FINDINGS: Significant increase size of right pneumothorax, now moderate in size. A right pigtail catheter remains in place, however the pigtail is less coiled than on prior exam. Slight increase in right basilar opacity. Left upper lobe mass is again seen. The exam is otherwise unchanged. IMPRESSION: 1. Definite increase in size of right pneumothorax, now moderate in size. A right pigtail catheter remains in place, however the pigtail catheter is less coiled than on prior exam. 2. Slight increase in right basilar opacity likely atelectasis. These results will be called to the ordering clinician or representative by the Radiologist Assistant, and communication documented in the PACS or [employer]. Electronically Signed   By: Keith Rake M.D.   On: 04/28/2021 15:53   DG Chest 1 View  Result Date: 04/24/2021 CLINICAL DATA:  Post bronchoscopy and right chest tube placement. Pneumothorax. EXAM: CHEST  1 VIEW COMPARISON:  Chest CT 04/23/2021 FINDINGS: Left upper lobe mass noted as seen on prior CT. Right chest tube in place. Lucency noted over the right lateral chest and right apex concerning for pneumothorax, but the pleural edge is difficult to  visualize on this portable study. Right basilar opacity, likely atelectasis. No visible effusions. IMPRESSION: Right chest tube in place. Lucency over the lateral and upper right hemithorax likely reflects pneumothorax, but pleural edge is difficult to visualize. Left upper lobe mass again noted. Electronically Signed   By: Rolm Baptise M.D.   On: 04/24/2021 18:06   CT HEAD WO CONTRAST (5MM)  Result Date: 04/22/2021 CLINICAL DATA:  Worsening headaches.  Dizziness and lightheaded. EXAM: CT HEAD WITHOUT CONTRAST TECHNIQUE: Contiguous axial images were obtained from the base of the skull through the vertex without intravenous contrast. COMPARISON:  Brain MRI 07/15/2018 FINDINGS: Brain: No abnormality seen affecting the brainstem or cerebellum. There are 2 separate areas of vasogenic edema, 1 within the inferior left frontal region in the other at the left parietal vertex. There are presumed underlying masses in both of those areas, though they are not clearly depicted. No evidence of hemorrhage. No left-to-right shift. No abnormality seen in the right hemisphere. No extra-axial fluid collection. The differential diagnosis includes venous infarctions and cerebritis. MRI with contrast strongly recommended. Vascular: No abnormal vascular finding. Skull: Normal Sinuses/Orbits: Clear/normal Other: None IMPRESSION: Two separate areas of vasogenic edema within the left hemisphere, 1 within the inferior frontal region in the other at the left parietal vertex. Metastatic disease would be the most likely cause of this appearance. Other possibilities include venous infarctions and cerebritis. MRI with contrast recommended. Call report in progress. Electronically Signed   By: Nelson Chimes M.D.   On: 04/22/2021 16:07   CT Chest W Contrast  Result Date: 04/23/2021 CLINICAL DATA:  Metastatic disease evaluation. Brain metastases on MRI. EXAM: CT CHEST, ABDOMEN, AND PELVIS WITH CONTRAST TECHNIQUE: Multidetector CT imaging of  the chest, abdomen and pelvis was performed following the standard protocol during bolus administration of intravenous contrast. CONTRAST:  81mL OMNIPAQUE IOHEXOL 300 MG/ML  SOLN COMPARISON:  05/25/2015 FINDINGS: CT CHEST FINDINGS Cardiovascular: Heart is normal size. Aorta is normal caliber. Mediastinum/Nodes: No mediastinal, hilar, or axillary adenopathy. Trachea and esophagus are unremarkable. Thyroid unremarkable. Lungs/Pleura: 4.5 cm left upper lobe spiculated mass compatible with primary lung cancer. No  pleural effusions. This abuts the medial pleural surface and mediastinum. Few small adjacent pre-vascular lymph nodes, not pathologically enlarged based on CT size criteria. Musculoskeletal: Chest wall soft tissues are unremarkable. No acute bony abnormality. CT ABDOMEN PELVIS FINDINGS Hepatobiliary: Insert paddle biliary Pancreas: No focal abnormality or ductal dilatation. Spleen: No focal abnormality.  Normal size. Adrenals/Urinary Tract: No adrenal abnormality. No focal renal abnormality. No stones or hydronephrosis. Urinary bladder is unremarkable. Stomach/Bowel: Stomach, large and small bowel grossly unremarkable. Vascular/Lymphatic: No evidence of aneurysm or adenopathy. Reproductive: Prior hysterectomy.  No adnexal masses. Other: No free fluid or free air. Musculoskeletal: No acute bony abnormality. IMPRESSION: 4.5 cm spiculated left upper lobe mass most compatible with primary lung cancer. This abuts the medial pleural surface. A few small adjacent pre-vascular mediastinal lymph nodes, none pathologically enlarged. Recommend cardiothoracic surgical and oncologic consultation. No acute findings or evidence of metastatic disease in the abdomen or pelvis. Electronically Signed   By: Rolm Baptise M.D.   On: 04/23/2021 22:38   MR BRAIN W WO CONTRAST  Result Date: 04/26/2021 CLINICAL DATA:  Metastatic disease to brain.  Lung mass. EXAM: MRI HEAD WITHOUT AND WITH CONTRAST TECHNIQUE: Multiplanar, multiecho  pulse sequences of the brain and surrounding structures were obtained without and with intravenous contrast. CONTRAST:  79mL GADAVIST GADOBUTROL 1 MMOL/ML IV SOLN COMPARISON:  MRI head 04/23/2021 FINDINGS: Brain: Multiple enhancing lesions the brain compatible with metastatic disease. Cystic ring-enhancing mass in the left inferior frontal lobe measures 25 x 18 mm with moderate surrounding vasogenic edema. 12.5 mm near solid enhancing mass lesion in the left posterior parietal lobe with moderate surrounding edema. 4 mm enhancing lesion right posterior temporal lobe without significant edema. 4 mm right medial frontal lesion over the convexity. Series 1100 image 248 3 mm enhancing lesion left medial frontal cortex. Series 1100, image 248 Question small 2-3 mm enhancing lesion right inferior frontal lobe. This is more convincing on the prior study. This is adjacent to the orbit but is likely a metastatic deposit. Ventricle size normal. Mild mass-effect on the left frontal horn due to edema. Negative for acute or chronic cerebral ischemia. Negative for intracranial hemorrhage. Vascular: Normal arterial flow voids at the skull base. Skull and upper cervical spine: Negative Sinuses/Orbits: Negative Other: None IMPRESSION: Metastatic disease to the brain as described above and unchanged from the recent study of 04/23/2021. There is edema in the left frontal lobe and left parietal lobe. No areas of hemorrhage. Electronically Signed   By: Franchot Gallo M.D.   On: 04/26/2021 15:12   MR Brain W and Wo Contrast  Result Date: 04/23/2021 CLINICAL DATA:  55 year old female with increasing headaches. Multifocal vasogenic edema in the brain on plain head CT yesterday. EXAM: MRI HEAD WITHOUT AND WITH CONTRAST TECHNIQUE: Multiplanar, multiecho pulse sequences of the brain and surrounding structures were obtained without and with intravenous contrast. CONTRAST:  58mL GADAVIST GADOBUTROL 1 MMOL/ML IV SOLN COMPARISON:  Head CT  04/22/2021. Brain MRI without contrast 07/15/2018. FINDINGS: Brain: Solid enhancing 16 mm mass in the posterosuperior left parietal lobe with moderate regional vasogenic edema (series 18, image 41 and series 20, image 18). Only mild regional mass effect. Large up to 29 mm rim enhancing cystic or necrotic mass in the left hemisphere at the inferior frontal gyrus junction with the temporal lobe (series 18, image 22 and series 19, image 21. Moderate vasogenic edema in the region but only mild regional mass effect - including on the anterior left temporal tip. Punctate 2-3  mm abnormal enhancement in the posterior right temporal lobe seen on both series 20, image 6 and series 19, image 14. Subtle similar right inferior frontal gyrus lesion best seen on series 18, image 24 and series 20, image 13. No associated edema. And suspicion of 2 similar small nodular areas of enhancement in the superior frontal gyri abutting the midline. See series 18, image 46 and series 19, images 19 and 17. Less likely these could be dural based. No edema. No dural thickening or enhancement elsewhere. No superimposed restricted diffusion to suggest acute infarction. No midline shift, ventriculomegaly, extra-axial collection or acute intracranial hemorrhage. Cervicomedullary junction and pituitary are within normal limits. Normal background gray and white matter signal. No cortical encephalomalacia or chronic cerebral blood products. Vascular: Major intracranial vascular flow voids in the are stable since 2020. Dominant left vertebral artery. The major dural venous sinuses are enhancing and appear to be patent. Skull and upper cervical spine: Negative. Visualized bone marrow signal is within normal limits. Sinuses/Orbits: Negative. Other: Trace right mastoid fluid. Negative nasopharynx. Negative visible scalp and face. IMPRESSION: 1. Metastatic disease to the brain. Two enhancing brain masses with moderate associated vasogenic edema: solid 16 mm  left parietal lobe mass, and a larger 29 mm rim enhancing cystic or necrotic mass in the left inferior frontal gyrus. Two other small 2-3 mm metastases in the right inferior frontal gyrus, right posterior temporal lobe. And questionable two additional punctate metastases in both superior frontal gyri abutting the falx (series 18, image 46). 2. No significant intracranial mass effect. No other intracranial abnormality. Electronically Signed   By: Genevie Ann M.D.   On: 04/23/2021 06:41   CT Abdomen Pelvis W Contrast  Result Date: 04/23/2021 CLINICAL DATA:  Metastatic disease evaluation. Brain metastases on MRI. EXAM: CT CHEST, ABDOMEN, AND PELVIS WITH CONTRAST TECHNIQUE: Multidetector CT imaging of the chest, abdomen and pelvis was performed following the standard protocol during bolus administration of intravenous contrast. CONTRAST:  44mL OMNIPAQUE IOHEXOL 300 MG/ML  SOLN COMPARISON:  05/25/2015 FINDINGS: CT CHEST FINDINGS Cardiovascular: Heart is normal size. Aorta is normal caliber. Mediastinum/Nodes: No mediastinal, hilar, or axillary adenopathy. Trachea and esophagus are unremarkable. Thyroid unremarkable. Lungs/Pleura: 4.5 cm left upper lobe spiculated mass compatible with primary lung cancer. No pleural effusions. This abuts the medial pleural surface and mediastinum. Few small adjacent pre-vascular lymph nodes, not pathologically enlarged based on CT size criteria. Musculoskeletal: Chest wall soft tissues are unremarkable. No acute bony abnormality. CT ABDOMEN PELVIS FINDINGS Hepatobiliary: Insert paddle biliary Pancreas: No focal abnormality or ductal dilatation. Spleen: No focal abnormality.  Normal size. Adrenals/Urinary Tract: No adrenal abnormality. No focal renal abnormality. No stones or hydronephrosis. Urinary bladder is unremarkable. Stomach/Bowel: Stomach, large and small bowel grossly unremarkable. Vascular/Lymphatic: No evidence of aneurysm or adenopathy. Reproductive: Prior hysterectomy.  No  adnexal masses. Other: No free fluid or free air. Musculoskeletal: No acute bony abnormality. IMPRESSION: 4.5 cm spiculated left upper lobe mass most compatible with primary lung cancer. This abuts the medial pleural surface. A few small adjacent pre-vascular mediastinal lymph nodes, none pathologically enlarged. Recommend cardiothoracic surgical and oncologic consultation. No acute findings or evidence of metastatic disease in the abdomen or pelvis. Electronically Signed   By: Rolm Baptise M.D.   On: 04/23/2021 22:38   DG CHEST PORT 1 VIEW  Result Date: 04/29/2021 CLINICAL DATA:  Pneumothorax follow-up EXAM: PORTABLE CHEST 1 VIEW COMPARISON:  Chest x-ray 04/28/2021 FINDINGS: Right-sided chest tube in place. Interval re-expansion of the right lung with  no residual pneumothorax visualized. Stable appearance of a left upper lobe mass. No pleural effusion. IMPRESSION: No pneumothorax visualized.  Left upper lobe mass. Electronically Signed   By: Ofilia Neas M.D.   On: 04/29/2021 08:22   DG CHEST PORT 1 VIEW  Result Date: 04/27/2021 CLINICAL DATA:  Pneumothorax.  Chest tube EXAM: PORTABLE CHEST 1 VIEW COMPARISON:  04/26/2021 FINDINGS: Pigtail chest tube on the right unchanged. Minimal right apical pneumothorax has improved. Left upper lobe mass density unchanged. Lungs are well aerated and clear.  No infiltrate or effusion. IMPRESSION: Improvement in right apical pneumothorax which is now minimal. Left upper lobe mass lesion unchanged. Electronically Signed   By: Franchot Gallo M.D.   On: 04/27/2021 12:06   DG CHEST PORT 1 VIEW  Result Date: 04/26/2021 CLINICAL DATA:  Pneumothorax, chest pain EXAM: PORTABLE CHEST 1 VIEW COMPARISON:  Previous studies including the examination of 04/25/2021 FINDINGS: There is interval increase in size of right apical pneumothorax which measures approximately 2 cm in the current study. Tip of right chest tube is noted in the right mid lung fields with possible cephalad  migration. Alveolar infiltrate/mass in the left upper lung fields has not changed. This may suggest pneumonia and/or neoplastic process. Costophrenic angles are clear. IMPRESSION: There is interval increase in size of right apical pneumothorax measuring 2 cm. Right lung remains well expanded. There is no shift of mediastinum. Alveolar density in the left upper lung fields has not changed significantly Electronically Signed   By: Elmer Picker M.D.   On: 04/26/2021 10:00   DG Chest Port 1 View  Result Date: 04/25/2021 CLINICAL DATA:  Chest tube, pneumothorax EXAM: PORTABLE CHEST 1 VIEW COMPARISON:  Chest x-ray 04/24/2021 FINDINGS: Right-sided chest tube in place. Interval expansion of the right lung with persistent small pneumothorax measuring approximately 12 mm from the apex. Stable masslike opacity in the left upper lung zone. Cardiomediastinal silhouette is unchanged. No significant pleural effusion visualized. IMPRESSION: Small right pneumothorax, improved since previous study. Electronically Signed   By: Ofilia Neas M.D.   On: 04/25/2021 08:48   DG CHEST PORT 1 VIEW  Result Date: 04/24/2021 CLINICAL DATA:  Follow-up pneumothorax. EXAM: PORTABLE CHEST 1 VIEW COMPARISON:  1-1/2 hours ago. FINDINGS: Pigtail catheter projects over the right mid lower lung zone. Unchanged size of moderate right pneumothorax. Pleural edge is at least 3 cm from the lung apex. There is increasing opacity in the medial right lung base. No mediastinal shift. Low lung volumes with masslike opacity in the left upper lobe unchanged. Otherwise unchanged exam. IMPRESSION: 1. Unchanged size of moderate right pneumothorax. Right chest tube remains in place. 2. Increasing opacity in the medial right lung base, likely atelectasis. 3. Unchanged masslike opacity in the left upper lobe. Electronically Signed   By: Keith Rake M.D.   On: 04/24/2021 19:23   EEG adult  Result Date: 04/24/2021 Derek Jack, MD      04/24/2021  2:05 PM Routine EEG Report MAGDALYN ARENIVAS is a 55 y.o. female with a history of presyncope who is undergoing an EEG to evaluate for seizures. Report: This EEG was acquired with electrodes placed according to the International 10-20 electrode system (including Fp1, Fp2, F3, F4, C3, C4, P3, P4, O1, O2, T3, T4, T5, T6, A1, A2, Fz, Cz, Pz). The following electrodes were missing or displaced: none. The occipital dominant rhythm was 9 Hz. This activity is reactive to stimulation. Drowsiness was manifested by background fragmentation; deeper stages of sleep were  not identified. There was intermittent focal slowing over the left posterior region. There were no interictal epileptiform discharges. There were no electrographic seizures identified. Photic stimulation and hyperventilation were not performed. Impression and clinical correlation: This EEG was obtained while awake and drowsy and is abnormal due to intermittent focal slowing over the left posterior region, indicative of focal cerebral dysfunction in that area. There were no electrographic seizures observed during this recording. Su Monks, MD [facility] [phone] If 7pm- 7am, please page neurology on call as listed in [place].   DG C-ARM BRONCHOSCOPY  Result Date: 04/24/2021 C-ARM BRONCHOSCOPY: Fluoroscopy was utilized by the requesting physician.  No radiographic interpretation.

## 2021-04-29 NOTE — Progress Notes (Addendum)
   NAME:  Abigail Golden, MRN:  [id number], DOB:  1966/06/06, LOS: 5 ADMISSION DATE:  04/23/2021, CONSULTATION DATE:  04/24/21 REFERRING MD:  Candiss Norse, CHIEF COMPLAINT:  headaches   History of Present Illness:  55 year old never smoker w/ hx of seasonal allergies, allergic bronchitis, childhood migraines presenting with 3-4 weeks of recurrent dizziness episodes associated with neuralgia-type pain across back of head.  Workup has revealed two brain metastases as well as a large LUL mass for which PCCM consulted.  No family hx of early cancers, no unusual exposures. Has hx of hysterectomy for fibroids.  Denies SOB, hemoptysis.  Has postnasal drip and dry cough which is chronic and associated with her seasonal allergies.  She has no focal weakness, trouble speaking, shaking or seizure-like events.  There is a question of if she is losing consciousness with these dizzy spells.  Dizzy spells are worse with exertion.  Works for school system.  Pertinent  Medical History   Past Medical History:  Diagnosis Date   Allergy    Anxiety    Chronic bronchitis ([place])    Depression    Headache    Hypoglycemia    Lower back pain    since fall ~ 2012 (05/25/2015)   Migraine    05/25/2015 "probably once/month"   Migraine headache    Pain in left hip    since fall ~ 2012 (05/25/2015)   Pneumonia "several times"     Significant Hospital Events: Including procedures, antibiotic start and stop dates in addition to other pertinent events   11/1-2 admitted, bronch, PTX, chest tube 04/26/2021 right chest tube still with airleak on waterseal 11/6 - Chest tube clamped with recurrence. Returned to suction 11/7 - Chest tube placed on waterseal  Interim History / Subjective:  Denies respiratory symptoms. Wearing [place]  Objective   Blood pressure (!) 155/80, pulse (!) 58, temperature 97.9 F (36.6 C), resp. rate 17, height 5' (1.524 m), weight 127 kg, SpO2 100 %.        Intake/Output Summary (Last 24 hours) at  04/29/2021 0902 Last data filed at 04/28/2021 2143 Gross per 24 hour  Intake 120 ml  Output --  Net 120 ml   Filed Weights   04/24/21 1351  Weight: 127 kg   Physical Exam: General: Well-appearing, no acute distress HENT: [place], AT, OP clear, MMM Eyes: EOMI, no scleral icterus Respiratory: Clear to auscultation bilaterally.  No crackles, wheezing or rales. Right chest tube in place. Tidaling. 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 If stable consider chest tube removal today or tomorrow --Rad Onc and MedOnc following: steroids and stereotactic radiosurgery --F/u path from biopsy --Has PET scheduled for Tuesday  Discussed above plan with patient and RN  Best Practice (right click and "Reselect all SmartList Selections" daily)   Per primary  Care Time: 35 min  Rodman Pickle, M.D. [facility] Pulmonary/Critical Care Medicine 04/29/2021 9:02 AM   See Amion for personal pager For hours between 7 PM to 7 AM, please call [place] for urgent questions

## 2021-04-30 ENCOUNTER — Ambulatory Visit: Payer: BC Managed Care – PPO

## 2021-04-30 ENCOUNTER — Encounter: Payer: Self-pay | Admitting: *Deleted

## 2021-04-30 ENCOUNTER — Other Ambulatory Visit (HOSPITAL_COMMUNITY): Payer: Self-pay

## 2021-04-30 ENCOUNTER — Inpatient Hospital Stay (HOSPITAL_COMMUNITY): Payer: BC Managed Care – PPO

## 2021-04-30 ENCOUNTER — Ambulatory Visit: Payer: BC Managed Care – PPO | Admitting: Radiation Oncology

## 2021-04-30 ENCOUNTER — Telehealth: Payer: Self-pay | Admitting: Physician Assistant

## 2021-04-30 ENCOUNTER — Other Ambulatory Visit: Payer: Self-pay | Admitting: Oncology

## 2021-04-30 ENCOUNTER — Encounter: Payer: Self-pay | Admitting: Radiation Therapy

## 2021-04-30 DIAGNOSIS — R918 Other nonspecific abnormal finding of lung field: Secondary | ICD-10-CM

## 2021-04-30 DIAGNOSIS — J95811 Postprocedural pneumothorax: Secondary | ICD-10-CM | POA: Diagnosis not present

## 2021-04-30 DIAGNOSIS — C7931 Secondary malignant neoplasm of brain: Secondary | ICD-10-CM

## 2021-04-30 LAB — GLUCOSE, CAPILLARY
Glucose-Capillary: 102 mg/dL — ABNORMAL HIGH (ref 70–99)
Glucose-Capillary: 110 mg/dL — ABNORMAL HIGH (ref 70–99)

## 2021-04-30 MED ORDER — DEXAMETHASONE 2 MG PO TABS
4.0000 mg | ORAL_TABLET | Freq: Two times a day (BID) | ORAL | 0 refills | Status: DC
  Filled 2021-04-30: qty 60, 15d supply, fill #0

## 2021-04-30 MED ORDER — SULFAMETHOXAZOLE-TRIMETHOPRIM 800-160 MG PO TABS
1.0000 | ORAL_TABLET | ORAL | 0 refills | Status: AC
Start: 2021-05-01 — End: 2021-05-31
  Filled 2021-04-30: qty 12, 28d supply, fill #0

## 2021-04-30 MED ORDER — PANTOPRAZOLE SODIUM 40 MG PO TBEC
40.0000 mg | DELAYED_RELEASE_TABLET | Freq: Every day | ORAL | 0 refills | Status: DC
  Filled 2021-04-30: qty 30, 30d supply, fill #0

## 2021-04-30 NOTE — Progress Notes (Incomplete)
°  Radiation Oncology         ([phone]) [phone] ________________________________  Name: Abigail Golden MRN: [id number]  Date: 05/01/2021  DOB: 09/30/1965  SIMULATION AND TREATMENT PLANNING NOTE    ICD-10-CM   1. Brain metastases ([place])  C79.31       DIAGNOSIS:  55 yo woman with six brain metastases from left upper lung cancer  NARRATIVE:  The patient was brought to the [place].  Identity was confirmed.  All relevant records and images related to the planned course of therapy were reviewed.  The patient freely provided informed written consent to proceed with treatment after reviewing the details related to the planned course of therapy. The consent form was witnessed and verified by the simulation staff. Intravenous access was established for contrast administration. Then, the patient was set-up in a stable reproducible supine position for radiation therapy.  A relocatable thermoplastic stereotactic head frame was fabricated for precise immobilization.  CT images were obtained.  Surface markings were placed.  The CT images were loaded into the planning software and fused with the patient's targeting MRI scan.  Then the target and avoidance structures were contoured.  Treatment planning then occurred.  The radiation prescription was entered and confirmed.  I have requested 3D planning  I have requested a DVH of the following structures: Brain stem, brain, left eye, right eye, lenses, optic chiasm, target volumes, uninvolved brain, and normal tissue.    SPECIAL TREATMENT PROCEDURE:  The planned course of therapy using radiation constitutes a special treatment procedure. Special care is required in the management of this patient for the following reasons. This treatment constitutes a Special Treatment Procedure for the following reason: High dose per fraction requiring special monitoring for increased toxicities of treatment including daily imaging.  The special nature of the planned  course of radiotherapy will require increased physician supervision and oversight to ensure patient's safety with optimal treatment outcomes.  This requires extended time and effort.  PLAN:  The patient will receive 27 Gy in 3 fractions to the following 6 targets.  Left Frontal 2.5 cm  Left Parietal 1.3 cm Right Temporal 4 mm Right Frontal 4 mm  Left Frontal 3 mm  Right Frontal 2 mm  ________________________________  Sheral Apley Tammi Klippel, M.D.

## 2021-04-30 NOTE — Plan of Care (Signed)

## 2021-04-30 NOTE — Procedures (Signed)
                             Pulmonary procedure note   Procedure:Right anterior chest tube removal Date.  November 8th 2022 Performed by Richardson Landry Qiana Landgrebe, ACNP  Right anterior chest to removed without problem.  Chest x-ray was reviewed prior to the removal.  There was no air leak at time of removal.  She tolerated the procedure well she does have large component of anxiety.  She was given 2 mg IV morphine for fever.  Procedure went well.  Chest is has been removed follow-up chest x-ray has been ordered.   Richardson Landry Aliayah Tyer ACNP Acute Care Nurse Practitioner [place] Please consult Amion 04/30/2021, 8:33 AM

## 2021-04-30 NOTE — Progress Notes (Signed)
Radiation planning schedule has been updated. Ms. Ramaker will come in as an OP Thursday morning, 11/10 for simulation and we will prepare for her first treatment fraction on Wed 11/16. She will receive a total of three treatments, completing on Monday 11/21. I will call pt again after discharge to review her printed appointments on her AVS with her and make sure she has no questions or concerns.   Mont Dutton R.T.(R)(T) Radiation Special Procedures Navigator  [phone]

## 2021-04-30 NOTE — Telephone Encounter (Signed)
Scheduled appt per 11/8 referral. Called pt, no answer. Left msg with appt date and time. Also requested pt call me back to confirm the appt. Per secure chat with Mikey Bussing, okay to schedule with Cassie at 9am.

## 2021-04-30 NOTE — Progress Notes (Signed)
Per Dr. Burr Medico, I contacted pathology to have them analyze recent bx to see if there is enough for molecular and PDL 1 testing. Bx on 04/24/21 is not completed yet but they will check with pathologist for an update.

## 2021-04-30 NOTE — Discharge Instructions (Signed)
Follow with Primary MD Aletha Halim., PA-C in 7 days   Get CBC, CMP, 2hecked  by Primary MD next visit.    Activity: As tolerated with Full fall precautions use walker/cane & assistance as needed   Disposition Home    Diet: Regular diet   On your next visit with your primary care physician please Get Medicines reviewed and adjusted.   Please request your Prim.MD to go over all Hospital Tests and Procedure/Radiological results at the follow up, please get all Hospital records sent to your Prim MD by signing hospital release before you go home.   If you experience worsening of your admission symptoms, develop shortness of breath, life threatening emergency, suicidal or homicidal thoughts you must seek medical attention immediately by calling 911 or calling your MD immediately  if symptoms less severe.  You Must read complete instructions/literature along with all the possible adverse reactions/side effects for all the Medicines you take and that have been prescribed to you. Take any new Medicines after you have completely understood and accpet all the possible adverse reactions/side effects.   Do not drive, operating heavy machinery, perform activities at heights, swimming or participation in water activities or provide baby sitting services if your were admitted for syncope or siezures until you have seen by Primary MD or a Neurologist and advised to do so again.  Do not drive when taking Pain medications.    Do not take more than prescribed Pain, Sleep and Anxiety Medications  Special Instructions: If you have smoked or chewed Tobacco  in the last 2 yrs please stop smoking, stop any regular Alcohol  and or any Recreational drug use.  Wear Seat belts while driving.   Please note  You were cared for by a hospitalist during your hospital stay. If you have any questions about your discharge medications or the care you received while you were in the hospital after you are  discharged, you can call the unit and asked to speak with the hospitalist on call if the hospitalist that took care of you is not available. Once you are discharged, your primary care physician will handle any further medical issues. Please note that NO REFILLS for any discharge medications will be authorized once you are discharged, as it is imperative that you return to your primary care physician (or establish a relationship with a primary care physician if you do not have one) for your aftercare needs so that they can reassess your need for medications and monitor your lab values.

## 2021-04-30 NOTE — Discharge Summary (Signed)
Physician Discharge Summary  Abigail Golden IRC:[id number] DOB: June 06, 1966 DOA: 04/23/2021  PCP: Aletha Halim., PA-C  Admit date: 04/23/2021 Discharge date: 04/30/2021  Admitted From: Home Disposition:  Home  Recommendations for Outpatient Follow-up:  Follow up with PCP in 1-2 weeks Patient instructed to follow with radiation oncology this Thursday for simulation To follow with oncology as an outpatient, she will be called with appointment.  Home Health:NO  Discharge Condition:Stable CODE STATUS:FULL Diet recommendation: Heart Healthy   Brief/Interim Summary:  Abigail Golden is a 55 y.o. female with history of migraine and allergies presents to the ER after outpatient CT scan showing a mass, MRI brain here confirmed brain mass along further work-up also suggests primary lung cancer with mets to the brain.  She was admitted for further work-up.  She had a biopsy of left upper lobe mass via bronchoscopy 04/24/2021, she has right lung Pneumothorax after procedure, where she required chest tube.  She was seen by  oncology, radiation oncology as well.   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 -work-up significant for primary metastatic lung cancer, initial biopsy results none small cell carcinoma (likely adenocarcinoma as she is non-smoker) . -Patient was treated with IV Decadron during hospital stay, this has been changed to p.o. Decadron, she was seen by oncology, radiation oncology, as well neurosurgery, recommendation for stereotactic radiosurgery by radiation oncology, simulation is planned for this Thursday, and she is to start treatment next week, with planned total of 3 sessions, she will follow with oncology as an outpatient once pathology results are finalized, she will be discharged on Decadron 4 mg oral twice daily, and Bactrim prophylaxis 3 days weekly, and Protonix for  GI prophylaxis.    Iatrogenic right pneumothorax -Was sustained during bronchoscopy for left upper lung mass biopsy, managed by PCCM during hospital stay, this was discontinued today, with chest x-ray confirming no recurrence of pneumothorax.      Morbid obesity with BMI of 50.  Follow with PCP.   History of allergies and migraines.  Supportive care.  Discharge Diagnoses:  Principal Problem:   Brain metastases (Abigail Golden) Active Problems:   Chronic migraine without aura without status migrainosus, not intractable   Lung mass    Discharge Instructions  Discharge Instructions     Diet - low sodium heart healthy   Complete by: As directed    Diet - low sodium heart healthy   Complete by: As directed    Increase activity slowly   Complete by: As directed    Increase activity slowly   Complete by: As directed    No wound care   Complete by: As directed    No wound care   Complete by: As directed       Allergies as of 04/30/2021       Reactions   Celebrex [celecoxib] Rash   On face itching   Minocycline Rash   On face Itching        Medication List     STOP taking these medications    Cambia 50 MG Pack Generic drug: Diclofenac Potassium(Migraine)   estradiol 2 MG tablet Commonly known as: ESTRACE       TAKE these medications    acetaminophen 500 MG tablet Commonly known as: TYLENOL Take 500 mg by mouth every 6 (six) hours as needed for moderate pain or headache.   albuterol 108 (90 Base) MCG/ACT inhaler  Commonly known as: VENTOLIN HFA Inhale 2 puffs into the lungs every 4 (four) hours as needed.   ALPRAZolam 0.25 MG tablet Commonly known as: XANAX Take 0.25 mg by mouth every 4 (four) hours as needed for anxiety. for anxiety   aspirin EC 81 MG tablet Take 81 mg by mouth daily. Swallow whole.   citalopram 10 MG tablet Commonly known as: CELEXA Take 10 mg by mouth at bedtime.   dexamethasone 2 MG tablet Commonly known as: DECADRON Take 2  tablets (4 mg total) by mouth 2 (two) times daily for 15 days.   diclofenac sodium 1 % Gel Commonly known as: VOLTAREN Apply topically as needed. To affected area   diphenhydrAMINE 25 MG tablet Commonly known as: BENADRYL Take 25 mg by mouth See admin instructions. 25 mg at bedtime 25 mg as needed for itching   eletriptan 20 MG tablet Commonly known as: RELPAX Take 20 mg by mouth as needed for headache (at onset of migraine.). May repeat in 2 hours if needed.   fluticasone 50 MCG/ACT nasal spray Commonly known as: FLONASE Place 2 sprays into both nostrils daily as needed for allergies.   HYDROcodone-acetaminophen 5-325 MG tablet Commonly known as: NORCO/VICODIN Take 1 tablet by mouth every 4 (four) hours as needed.   hydrOXYzine 25 MG tablet Commonly known as: ATARAX/VISTARIL Take 25 mg by mouth See admin instructions. 25 mg at bedtime 25 mg during the day as needed for itching/anxiety   loratadine 10 MG tablet Commonly known as: CLARITIN Take 10 mg by mouth daily.   montelukast 10 MG tablet Commonly known as: SINGULAIR Take 10 mg by mouth at bedtime.   ondansetron 4 MG disintegrating tablet Commonly known as: ZOFRAN-ODT Take 4 mg by mouth every 8 (eight) hours as needed for nausea or vomiting.   pantoprazole 40 MG tablet Commonly known as: Protonix Take 1 tablet (40 mg total) by mouth daily.   solifenacin 10 MG tablet Commonly known as: VESICARE Take 10 mg by mouth every evening.   sulfamethoxazole-trimethoprim 800-160 MG tablet Commonly known as: BACTRIM DS Take 1 tablet by mouth 3 (three) times a week. Start taking on: May 01, 2021        Follow-up Information     Tyler Pita, MD Follow up on 05/02/2021.   Specialty: Radiation Oncology Why: please be there at 8:15 am on 05/02/2021  Radiation planning schedule has been updated. Ms. Mothershead will come in as an OP Thursday morning, 11/10 for simulation and we will prepare for her first treatment  fraction on Wed 11/16. She will receive a total of three treatments, completing on Monday 11/21 Contact information: [place] [id number] [phone]         Truitt Merle, MD Follow up.   Specialties: Hematology, Oncology Why: You will be called with an appointment Contact information: [place] [address] [phone]                Allergies  Allergen Reactions   Celebrex [Celecoxib] Rash    On face itching   Minocycline Rash    On face Itching    Consultations: Oncology Dr. Annamaria Boots Radiation oncology Dr. Tammi Klippel Neurosurgery Dr. Reatha Armour   Procedures/Studies: DG Chest 1 View  Result Date: 04/28/2021 CLINICAL DATA:  Pneumothorax. EXAM: CHEST  1 VIEW COMPARISON:  Radiograph 04/27/2021 FINDINGS: Significant increase size of right pneumothorax, now moderate in size. A right pigtail catheter remains in place, however the pigtail is less coiled than  on prior exam. Slight increase in right basilar opacity. Left upper lobe mass is again seen. The exam is otherwise unchanged. IMPRESSION: 1. Definite increase in size of right pneumothorax, now moderate in size. A right pigtail catheter remains in place, however the pigtail catheter is less coiled than on prior exam. 2. Slight increase in right basilar opacity likely atelectasis. These results will be called to the ordering clinician or representative by the Radiologist Assistant, and communication documented in the PACS or [employer]. Electronically Signed   By: Keith Rake M.D.   On: 04/28/2021 15:53   DG Chest 1 View  Result Date: 04/24/2021 CLINICAL DATA:  Post bronchoscopy and right chest tube placement. Pneumothorax. EXAM: CHEST  1 VIEW COMPARISON:  Chest CT 04/23/2021 FINDINGS: Left upper lobe mass noted as seen on prior CT. Right chest tube in place. Lucency noted over the right lateral chest and right apex concerning for pneumothorax, but the pleural edge is  difficult to visualize on this portable study. Right basilar opacity, likely atelectasis. No visible effusions. IMPRESSION: Right chest tube in place. Lucency over the lateral and upper right hemithorax likely reflects pneumothorax, but pleural edge is difficult to visualize. Left upper lobe mass again noted. Electronically Signed   By: Rolm Baptise M.D.   On: 04/24/2021 18:06   CT HEAD WO CONTRAST (5MM)  Result Date: 04/22/2021 CLINICAL DATA:  Worsening headaches.  Dizziness and lightheaded. EXAM: CT HEAD WITHOUT CONTRAST TECHNIQUE: Contiguous axial images were obtained from the base of the skull through the vertex without intravenous contrast. COMPARISON:  Brain MRI 07/15/2018 FINDINGS: Brain: No abnormality seen affecting the brainstem or cerebellum. There are 2 separate areas of vasogenic edema, 1 within the inferior left frontal region in the other at the left parietal vertex. There are presumed underlying masses in both of those areas, though they are not clearly depicted. No evidence of hemorrhage. No left-to-right shift. No abnormality seen in the right hemisphere. No extra-axial fluid collection. The differential diagnosis includes venous infarctions and cerebritis. MRI with contrast strongly recommended. Vascular: No abnormal vascular finding. Skull: Normal Sinuses/Orbits: Clear/normal Other: None IMPRESSION: Two separate areas of vasogenic edema within the left hemisphere, 1 within the inferior frontal region in the other at the left parietal vertex. Metastatic disease would be the most likely cause of this appearance. Other possibilities include venous infarctions and cerebritis. MRI with contrast recommended. Call report in progress. Electronically Signed   By: Nelson Chimes M.D.   On: 04/22/2021 16:07   CT Chest W Contrast  Result Date: 04/23/2021 CLINICAL DATA:  Metastatic disease evaluation. Brain metastases on MRI. EXAM: CT CHEST, ABDOMEN, AND PELVIS WITH CONTRAST TECHNIQUE: Multidetector CT  imaging of the chest, abdomen and pelvis was performed following the standard protocol during bolus administration of intravenous contrast. CONTRAST:  5mL OMNIPAQUE IOHEXOL 300 MG/ML  SOLN COMPARISON:  05/25/2015 FINDINGS: CT CHEST FINDINGS Cardiovascular: Heart is normal size. Aorta is normal caliber. Mediastinum/Nodes: No mediastinal, hilar, or axillary adenopathy. Trachea and esophagus are unremarkable. Thyroid unremarkable. Lungs/Pleura: 4.5 cm left upper lobe spiculated mass compatible with primary lung cancer. No pleural effusions. This abuts the medial pleural surface and mediastinum. Few small adjacent pre-vascular lymph nodes, not pathologically enlarged based on CT size criteria. Musculoskeletal: Chest wall soft tissues are unremarkable. No acute bony abnormality. CT ABDOMEN PELVIS FINDINGS Hepatobiliary: Insert paddle biliary Pancreas: No focal abnormality or ductal dilatation. Spleen: No focal abnormality.  Normal size. Adrenals/Urinary Tract: No adrenal abnormality. No focal renal abnormality. No stones  or hydronephrosis. Urinary bladder is unremarkable. Stomach/Bowel: Stomach, large and small bowel grossly unremarkable. Vascular/Lymphatic: No evidence of aneurysm or adenopathy. Reproductive: Prior hysterectomy.  No adnexal masses. Other: No free fluid or free air. Musculoskeletal: No acute bony abnormality. IMPRESSION: 4.5 cm spiculated left upper lobe mass most compatible with primary lung cancer. This abuts the medial pleural surface. A few small adjacent pre-vascular mediastinal lymph nodes, none pathologically enlarged. Recommend cardiothoracic surgical and oncologic consultation. No acute findings or evidence of metastatic disease in the abdomen or pelvis. Electronically Signed   By: Rolm Baptise M.D.   On: 04/23/2021 22:38   MR BRAIN W WO CONTRAST  Result Date: 04/26/2021 CLINICAL DATA:  Metastatic disease to brain.  Lung mass. EXAM: MRI HEAD WITHOUT AND WITH CONTRAST TECHNIQUE: Multiplanar,  multiecho pulse sequences of the brain and surrounding structures were obtained without and with intravenous contrast. CONTRAST:  71mL GADAVIST GADOBUTROL 1 MMOL/ML IV SOLN COMPARISON:  MRI head 04/23/2021 FINDINGS: Brain: Multiple enhancing lesions the brain compatible with metastatic disease. Cystic ring-enhancing mass in the left inferior frontal lobe measures 25 x 18 mm with moderate surrounding vasogenic edema. 12.5 mm near solid enhancing mass lesion in the left posterior parietal lobe with moderate surrounding edema. 4 mm enhancing lesion right posterior temporal lobe without significant edema. 4 mm right medial frontal lesion over the convexity. Series 1100 image 248 3 mm enhancing lesion left medial frontal cortex. Series 1100, image 248 Question small 2-3 mm enhancing lesion right inferior frontal lobe. This is more convincing on the prior study. This is adjacent to the orbit but is likely a metastatic deposit. Ventricle size normal. Mild mass-effect on the left frontal horn due to edema. Negative for acute or chronic cerebral ischemia. Negative for intracranial hemorrhage. Vascular: Normal arterial flow voids at the skull base. Skull and upper cervical spine: Negative Sinuses/Orbits: Negative Other: None IMPRESSION: Metastatic disease to the brain as described above and unchanged from the recent study of 04/23/2021. There is edema in the left frontal lobe and left parietal lobe. No areas of hemorrhage. Electronically Signed   By: Franchot Gallo M.D.   On: 04/26/2021 15:12   MR Brain W and Wo Contrast  Result Date: 04/23/2021 CLINICAL DATA:  55 year old female with increasing headaches. Multifocal vasogenic edema in the brain on plain head CT yesterday. EXAM: MRI HEAD WITHOUT AND WITH CONTRAST TECHNIQUE: Multiplanar, multiecho pulse sequences of the brain and surrounding structures were obtained without and with intravenous contrast. CONTRAST:  14mL GADAVIST GADOBUTROL 1 MMOL/ML IV SOLN COMPARISON:   Head CT 04/22/2021. Brain MRI without contrast 07/15/2018. FINDINGS: Brain: Solid enhancing 16 mm mass in the posterosuperior left parietal lobe with moderate regional vasogenic edema (series 18, image 41 and series 20, image 18). Only mild regional mass effect. Large up to 29 mm rim enhancing cystic or necrotic mass in the left hemisphere at the inferior frontal gyrus junction with the temporal lobe (series 18, image 22 and series 19, image 21. Moderate vasogenic edema in the region but only mild regional mass effect - including on the anterior left temporal tip. Punctate 2-3 mm abnormal enhancement in the posterior right temporal lobe seen on both series 20, image 6 and series 19, image 14. Subtle similar right inferior frontal gyrus lesion best seen on series 18, image 24 and series 20, image 13. No associated edema. And suspicion of 2 similar small nodular areas of enhancement in the superior frontal gyri abutting the midline. See series 18, image 46 and series  19, images 19 and 17. Less likely these could be dural based. No edema. No dural thickening or enhancement elsewhere. No superimposed restricted diffusion to suggest acute infarction. No midline shift, ventriculomegaly, extra-axial collection or acute intracranial hemorrhage. Cervicomedullary junction and pituitary are within normal limits. Normal background gray and white matter signal. No cortical encephalomalacia or chronic cerebral blood products. Vascular: Major intracranial vascular flow voids in the are stable since 2020. Dominant left vertebral artery. The major dural venous sinuses are enhancing and appear to be patent. Skull and upper cervical spine: Negative. Visualized bone marrow signal is within normal limits. Sinuses/Orbits: Negative. Other: Trace right mastoid fluid. Negative nasopharynx. Negative visible scalp and face. IMPRESSION: 1. Metastatic disease to the brain. Two enhancing brain masses with moderate associated vasogenic edema:  solid 16 mm left parietal lobe mass, and a larger 29 mm rim enhancing cystic or necrotic mass in the left inferior frontal gyrus. Two other small 2-3 mm metastases in the right inferior frontal gyrus, right posterior temporal lobe. And questionable two additional punctate metastases in both superior frontal gyri abutting the falx (series 18, image 46). 2. No significant intracranial mass effect. No other intracranial abnormality. Electronically Signed   By: Genevie Ann M.D.   On: 04/23/2021 06:41   CT Abdomen Pelvis W Contrast  Result Date: 04/23/2021 CLINICAL DATA:  Metastatic disease evaluation. Brain metastases on MRI. EXAM: CT CHEST, ABDOMEN, AND PELVIS WITH CONTRAST TECHNIQUE: Multidetector CT imaging of the chest, abdomen and pelvis was performed following the standard protocol during bolus administration of intravenous contrast. CONTRAST:  41mL OMNIPAQUE IOHEXOL 300 MG/ML  SOLN COMPARISON:  05/25/2015 FINDINGS: CT CHEST FINDINGS Cardiovascular: Heart is normal size. Aorta is normal caliber. Mediastinum/Nodes: No mediastinal, hilar, or axillary adenopathy. Trachea and esophagus are unremarkable. Thyroid unremarkable. Lungs/Pleura: 4.5 cm left upper lobe spiculated mass compatible with primary lung cancer. No pleural effusions. This abuts the medial pleural surface and mediastinum. Few small adjacent pre-vascular lymph nodes, not pathologically enlarged based on CT size criteria. Musculoskeletal: Chest wall soft tissues are unremarkable. No acute bony abnormality. CT ABDOMEN PELVIS FINDINGS Hepatobiliary: Insert paddle biliary Pancreas: No focal abnormality or ductal dilatation. Spleen: No focal abnormality.  Normal size. Adrenals/Urinary Tract: No adrenal abnormality. No focal renal abnormality. No stones or hydronephrosis. Urinary bladder is unremarkable. Stomach/Bowel: Stomach, large and small bowel grossly unremarkable. Vascular/Lymphatic: No evidence of aneurysm or adenopathy. Reproductive: Prior  hysterectomy.  No adnexal masses. Other: No free fluid or free air. Musculoskeletal: No acute bony abnormality. IMPRESSION: 4.5 cm spiculated left upper lobe mass most compatible with primary lung cancer. This abuts the medial pleural surface. A few small adjacent pre-vascular mediastinal lymph nodes, none pathologically enlarged. Recommend cardiothoracic surgical and oncologic consultation. No acute findings or evidence of metastatic disease in the abdomen or pelvis. Electronically Signed   By: Rolm Baptise M.D.   On: 04/23/2021 22:38   DG CHEST PORT 1 VIEW  Result Date: 04/30/2021 CLINICAL DATA:  Chest tube removal. EXAM: PORTABLE CHEST 1 VIEW COMPARISON:  04/30/2021 earlier same day FINDINGS: Interval removal of right chest tube. No evidence for right-sided pneumothorax. No right pleural effusion. Suprahilar left lung lesion again noted. The cardiopericardial silhouette is within normal limits for size. The visualized bony structures of the thorax show no acute abnormality. Telemetry leads overlie the chest. IMPRESSION: No evidence for pneumothorax status post right chest tube removal. Electronically Signed   By: Misty Stanley M.D.   On: 04/30/2021 11:38   DG CHEST PORT 1 VIEW  Result Date: 04/30/2021 CLINICAL DATA:  Pneumothorax EXAM: PORTABLE CHEST 1 VIEW COMPARISON:  Previous studies including the examinations done on 04/29/2021 FINDINGS: Cardiac size is within normal limits. Mediastinum is unremarkable. There is homogeneous opacity in the medial left upper lung fields with no significant interval change. There are no new infiltrates or signs of pulmonary edema. Right chest tube is noted with its tip in the right mid lung fields. There is no evidence of pleural effusion or pneumothorax. IMPRESSION: There is no pneumothorax. There are no new infiltrates or signs of pulmonary edema. Electronically Signed   By: Elmer Picker M.D.   On: 04/30/2021 08:29   DG CHEST PORT 1 VIEW  Result Date:  04/29/2021 CLINICAL DATA:  Right pneumothorax and chest tube EXAM: PORTABLE CHEST 1 VIEW COMPARISON:  04/29/2021 FINDINGS: Artifact overlies the chest. Right chest tube remains in place in the mid to upper chest. No visible residual pleural air. Lungs are well aerated. Left upper lung mass as seen previously. IMPRESSION: No pneumothorax on the right. Lungs well aerated. Redemonstration of previously seen medial left upper lung mass. Electronically Signed   By: Nelson Chimes M.D.   On: 04/29/2021 15:43   DG CHEST PORT 1 VIEW  Result Date: 04/29/2021 CLINICAL DATA:  Pneumothorax follow-up EXAM: PORTABLE CHEST 1 VIEW COMPARISON:  Chest x-ray 04/28/2021 FINDINGS: Right-sided chest tube in place. Interval re-expansion of the right lung with no residual pneumothorax visualized. Stable appearance of a left upper lobe mass. No pleural effusion. IMPRESSION: No pneumothorax visualized.  Left upper lobe mass. Electronically Signed   By: Ofilia Neas M.D.   On: 04/29/2021 08:22   DG CHEST PORT 1 VIEW  Result Date: 04/27/2021 CLINICAL DATA:  Pneumothorax.  Chest tube EXAM: PORTABLE CHEST 1 VIEW COMPARISON:  04/26/2021 FINDINGS: Pigtail chest tube on the right unchanged. Minimal right apical pneumothorax has improved. Left upper lobe mass density unchanged. Lungs are well aerated and clear.  No infiltrate or effusion. IMPRESSION: Improvement in right apical pneumothorax which is now minimal. Left upper lobe mass lesion unchanged. Electronically Signed   By: Franchot Gallo M.D.   On: 04/27/2021 12:06   DG CHEST PORT 1 VIEW  Result Date: 04/26/2021 CLINICAL DATA:  Pneumothorax, chest pain EXAM: PORTABLE CHEST 1 VIEW COMPARISON:  Previous studies including the examination of 04/25/2021 FINDINGS: There is interval increase in size of right apical pneumothorax which measures approximately 2 cm in the current study. Tip of right chest tube is noted in the right mid lung fields with possible cephalad migration. Alveolar  infiltrate/mass in the left upper lung fields has not changed. This may suggest pneumonia and/or neoplastic process. Costophrenic angles are clear. IMPRESSION: There is interval increase in size of right apical pneumothorax measuring 2 cm. Right lung remains well expanded. There is no shift of mediastinum. Alveolar density in the left upper lung fields has not changed significantly Electronically Signed   By: Elmer Picker M.D.   On: 04/26/2021 10:00   DG Chest Port 1 View  Result Date: 04/25/2021 CLINICAL DATA:  Chest tube, pneumothorax EXAM: PORTABLE CHEST 1 VIEW COMPARISON:  Chest x-ray 04/24/2021 FINDINGS: Right-sided chest tube in place. Interval expansion of the right lung with persistent small pneumothorax measuring approximately 12 mm from the apex. Stable masslike opacity in the left upper lung zone. Cardiomediastinal silhouette is unchanged. No significant pleural effusion visualized. IMPRESSION: Small right pneumothorax, improved since previous study. Electronically Signed   By: Ofilia Neas M.D.   On: 04/25/2021 08:48  DG CHEST PORT 1 VIEW  Result Date: 04/24/2021 CLINICAL DATA:  Follow-up pneumothorax. EXAM: PORTABLE CHEST 1 VIEW COMPARISON:  1-1/2 hours ago. FINDINGS: Pigtail catheter projects over the right mid lower lung zone. Unchanged size of moderate right pneumothorax. Pleural edge is at least 3 cm from the lung apex. There is increasing opacity in the medial right lung base. No mediastinal shift. Low lung volumes with masslike opacity in the left upper lobe unchanged. Otherwise unchanged exam. IMPRESSION: 1. Unchanged size of moderate right pneumothorax. Right chest tube remains in place. 2. Increasing opacity in the medial right lung base, likely atelectasis. 3. Unchanged masslike opacity in the left upper lobe. Electronically Signed   By: Keith Rake M.D.   On: 04/24/2021 19:23   EEG adult  Result Date: 04/24/2021 Derek Jack, MD     04/24/2021  2:05 PM Routine  EEG Report ATALIA LITZINGER is a 55 y.o. female with a history of presyncope who is undergoing an EEG to evaluate for seizures. Report: This EEG was acquired with electrodes placed according to the International 10-20 electrode system (including Fp1, Fp2, F3, F4, C3, C4, P3, P4, O1, O2, T3, T4, T5, T6, A1, A2, Fz, Cz, Pz). The following electrodes were missing or displaced: none. The occipital dominant rhythm was 9 Hz. This activity is reactive to stimulation. Drowsiness was manifested by background fragmentation; deeper stages of sleep were not identified. There was intermittent focal slowing over the left posterior region. There were no interictal epileptiform discharges. There were no electrographic seizures identified. Photic stimulation and hyperventilation were not performed. Impression and clinical correlation: This EEG was obtained while awake and drowsy and is abnormal due to intermittent focal slowing over the left posterior region, indicative of focal cerebral dysfunction in that area. There were no electrographic seizures observed during this recording. Su Monks, MD [facility] [phone] If 7pm- 7am, please page neurology on call as listed in [place].   DG C-ARM BRONCHOSCOPY  Result Date: 04/24/2021 C-ARM BRONCHOSCOPY: Fluoroscopy was utilized by the requesting physician.  No radiographic interpretation.      Subjective: She denies any chest pain chest pain, no shortness of breath, no fever  Discharge Exam: Vitals:   04/30/21 0427 04/30/21 1222  BP: 140/72 (!) 142/67  Pulse: (!) 57 61  Resp: 14   Temp: 98.2 F (36.8 C) 97.7 F (36.5 C)  SpO2: 100% 98%   Vitals:   04/29/21 1637 04/29/21 2148 04/30/21 0427 04/30/21 1222  BP: (!) 142/80 137/66 140/72 (!) 142/67  Pulse: 68 75 (!) 57 61  Resp: 18 20 14    Temp: 98.6 F (37 C) 97.6 F (36.4 C) 98.2 F (36.8 C) 97.7 F (36.5 C)  TempSrc:  Oral Oral   SpO2: 99% 99% 100% 98%  Weight:      Height:         General: Pt is alert, awake, not in acute distress Cardiovascular: RRR, S1/S2 +, no rubs, no gallops Respiratory: CTA bilaterally, no wheezing, no rhonchi Abdominal: Soft, NT, ND, bowel sounds + Extremities: no edema, no cyanosis    The results of significant diagnostics from this hospitalization (including imaging, microbiology, ancillary and laboratory) are listed below for reference.     Microbiology: Recent Results (from the past 240 hour(s))  Resp Panel by RT-PCR (Flu A&B, Covid) Nasopharyngeal Swab     Status: None   Collection Time: 04/23/21  5:27 PM   Specimen: Nasopharyngeal Swab; Nasopharyngeal(NP) swabs in vial transport medium  Result Value Ref  Range Status   SARS Coronavirus 2 by RT PCR NEGATIVE NEGATIVE Final    Comment: (NOTE) SARS-CoV-2 target nucleic acids are NOT DETECTED.  The SARS-CoV-2 RNA is generally detectable in upper respiratory specimens during the acute phase of infection. The lowest concentration of SARS-CoV-2 viral copies this assay can detect is 138 copies/mL. A negative result does not preclude SARS-Cov-2 infection and should not be used as the sole basis for treatment or other patient management decisions. A negative result may occur with  improper specimen collection/handling, submission of specimen other than nasopharyngeal swab, presence of viral mutation(s) within the areas targeted by this assay, and inadequate number of viral copies(<138 copies/mL). A negative result must be combined with clinical observations, patient history, and epidemiological information. The expected result is Negative.  Fact Sheet for Patients:  EntrepreneurPulse.com.au  Fact Sheet for Healthcare Providers:  IncredibleEmployment.be  This test is no t yet approved or cleared by the [place] FDA and  has been authorized for detection and/or diagnosis of SARS-CoV-2 by FDA under an Emergency Use Authorization (EUA). This  EUA will remain  in effect (meaning this test can be used) for the duration of the COVID-19 declaration under Section 564(b)(1) of the Act, 21 U.S.C.section 360bbb-3(b)(1), unless the authorization is terminated  or revoked sooner.       Influenza A by PCR NEGATIVE NEGATIVE Final   Influenza B by PCR NEGATIVE NEGATIVE Final    Comment: (NOTE) The Xpert Xpress SARS-CoV-2/FLU/RSV plus assay is intended as an aid in the diagnosis of influenza from Nasopharyngeal swab specimens and should not be used as a sole basis for treatment. Nasal washings and aspirates are unacceptable for Xpert Xpress SARS-CoV-2/FLU/RSV testing.  Fact Sheet for Patients: EntrepreneurPulse.com.au  Fact Sheet for Healthcare Providers: IncredibleEmployment.be  This test is not yet approved or cleared by the [place] FDA and has been authorized for detection and/or diagnosis of SARS-CoV-2 by FDA under an Emergency Use Authorization (EUA). This EUA will remain in effect (meaning this test can be used) for the duration of the COVID-19 declaration under Section 564(b)(1) of the Act, 21 U.S.C. section 360bbb-3(b)(1), unless the authorization is terminated or revoked.  Performed at [facility], [place] [address], [place], [place] [address]      Labs: BNP (last 3 results) Recent Labs    04/25/21 0316 04/26/21 0306 04/27/21 0426  BNP 38.5 57.5 99.8   Basic Metabolic Panel: Recent Labs  Lab 04/24/21 0520 04/25/21 0316 04/26/21 0306 04/27/21 0426 04/28/21 0327  NA 133* 135 135 134* 133*  K 4.3 4.2 4.4 4.5 4.3  CL 104 104 104 103 103  CO2 18* 23 24 25  21*  GLUCOSE 166* 173* 161* 122* 160*  BUN 10 15 16 17 19   CREATININE 0.77 1.19* 0.90 0.80 0.85  CALCIUM 8.9 8.9 8.8* 9.0 8.8*  MG  --  1.9 2.2 2.2 2.0   Liver Function Tests: Recent Labs  Lab 04/24/21 0520 04/25/21 0316 04/26/21 0306 04/27/21 0426 04/28/21 0327  AST 17 19 15  13* 14*  ALT 15 16 14  14 15   ALKPHOS 56 56 50 50 51  BILITOT 0.7 0.4 0.7 0.3 0.7  PROT 6.9 7.1 6.4* 6.6 6.1*  ALBUMIN 3.5 3.8 3.2* 3.3* 3.0*   No results for input(s): LIPASE, AMYLASE in the last 168 hours. No results for input(s): AMMONIA in the last 168 hours. CBC: Recent Labs  Lab 04/24/21 0520 04/25/21 0316 04/26/21 0306 04/27/21 0426 04/28/21 0327  WBC 4.6 10.7*  7.5 6.7 5.5  NEUTROABS 3.3 8.6* 6.0 4.2 3.8  HGB 13.0 12.7 11.3* 11.8* 12.0  HCT 37.8 37.4 33.7* 35.3* 35.6*  MCV 86.9 86.0 87.1 87.6 86.8  PLT 229 227 210 235 210   Cardiac Enzymes: No results for input(s): CKTOTAL, CKMB, CKMBINDEX, TROPONINI in the last 168 hours. BNP: Invalid input(s): POCBNP CBG: Recent Labs  Lab 04/29/21 1215 04/29/21 1639 04/29/21 2143 04/30/21 0837 04/30/21 1217  GLUCAP 135* 116* 115* 102* 110*   D-Dimer No results for input(s): DDIMER in the last 72 hours. Hgb A1c No results for input(s): HGBA1C in the last 72 hours. Lipid Profile No results for input(s): CHOL, HDL, LDLCALC, TRIG, CHOLHDL, LDLDIRECT in the last 72 hours. Thyroid function studies No results for input(s): TSH, T4TOTAL, T3FREE, THYROIDAB in the last 72 hours.  Invalid input(s): FREET3 Anemia work up No results for input(s): VITAMINB12, FOLATE, FERRITIN, TIBC, IRON, RETICCTPCT in the last 72 hours. Urinalysis    Component Value Date/Time   COLORURINE YELLOW 05/24/2015 2158   APPEARANCEUR CLEAR 05/24/2015 2158   LABSPEC 1.016 05/24/2015 2158   PHURINE 5.5 05/24/2015 2158   GLUCOSEU NEGATIVE 05/24/2015 2158   HGBUR NEGATIVE 05/24/2015 2158   [place] NEGATIVE 05/24/2015 2158   [place] NEGATIVE 05/24/2015 2158   PROTEINUR NEGATIVE 05/24/2015 2158   NITRITE NEGATIVE 05/24/2015 2158   LEUKOCYTESUR NEGATIVE 05/24/2015 2158   Sepsis Labs Invalid input(s): PROCALCITONIN,  WBC,  LACTICIDVEN Microbiology Recent Results (from the past 240 hour(s))  Resp Panel by RT-PCR (Flu A&B, Covid) Nasopharyngeal Swab     Status: None    Collection Time: 04/23/21  5:27 PM   Specimen: Nasopharyngeal Swab; Nasopharyngeal(NP) swabs in vial transport medium  Result Value Ref Range Status   SARS Coronavirus 2 by RT PCR NEGATIVE NEGATIVE Final    Comment: (NOTE) SARS-CoV-2 target nucleic acids are NOT DETECTED.  The SARS-CoV-2 RNA is generally detectable in upper respiratory specimens during the acute phase of infection. The lowest concentration of SARS-CoV-2 viral copies this assay can detect is 138 copies/mL. A negative result does not preclude SARS-Cov-2 infection and should not be used as the sole basis for treatment or other patient management decisions. A negative result may occur with  improper specimen collection/handling, submission of specimen other than nasopharyngeal swab, presence of viral mutation(s) within the areas targeted by this assay, and inadequate number of viral copies(<138 copies/mL). A negative result must be combined with clinical observations, patient history, and epidemiological information. The expected result is Negative.  Fact Sheet for Patients:  EntrepreneurPulse.com.au  Fact Sheet for Healthcare Providers:  IncredibleEmployment.be  This test is no t yet approved or cleared by the [place] FDA and  has been authorized for detection and/or diagnosis of SARS-CoV-2 by FDA under an Emergency Use Authorization (EUA). This EUA will remain  in effect (meaning this test can be used) for the duration of the COVID-19 declaration under Section 564(b)(1) of the Act, 21 U.S.C.section 360bbb-3(b)(1), unless the authorization is terminated  or revoked sooner.       Influenza A by PCR NEGATIVE NEGATIVE Final   Influenza B by PCR NEGATIVE NEGATIVE Final    Comment: (NOTE) The Xpert Xpress SARS-CoV-2/FLU/RSV plus assay is intended as an aid in the diagnosis of influenza from Nasopharyngeal swab specimens and should not be used as a sole basis for treatment.  Nasal washings and aspirates are unacceptable for Xpert Xpress SARS-CoV-2/FLU/RSV testing.  Fact Sheet for Patients: EntrepreneurPulse.com.au  Fact Sheet for Healthcare Providers: IncredibleEmployment.be  This test  is not yet approved or cleared by the [place] and has been authorized for detection and/or diagnosis of SARS-CoV-2 by FDA under an Emergency Use Authorization (EUA). This EUA will remain in effect (meaning this test can be used) for the duration of the COVID-19 declaration under Section 564(b)(1) of the Act, 21 U.S.C. section 360bbb-3(b)(1), unless the authorization is terminated or revoked.  Performed at [facility], [place] [address] [place], [place] [address]      Time coordinating discharge: Over 30 minutes  SIGNED:   Phillips Climes, MD  [facility] 04/30/2021, 2:40 PM Pager   If 7PM-7AM, please contact night-coverage [email or web address] Password [email or web address]

## 2021-04-30 NOTE — Progress Notes (Addendum)
NAME:  Abigail Golden, MRN:  [id number], DOB:  [birth year], LOS: 6 ADMISSION DATE:  04/23/2021, CONSULTATION DATE:  04/24/21 REFERRING MD:  Candiss Norse, CHIEF COMPLAINT:  headaches   History of Present Illness:  55 year old never smoker w/ hx of seasonal allergies, allergic bronchitis, childhood migraines presenting with 3-4 weeks of recurrent dizziness episodes associated with neuralgia-type pain across back of head.  Workup has revealed two brain metastases as well as a large LUL mass for which PCCM consulted.  No family hx of early cancers, no unusual exposures. Has hx of hysterectomy for fibroids.  Denies SOB, hemoptysis.  Has postnasal drip and dry cough which is chronic and associated with her seasonal allergies.  She has no focal weakness, trouble speaking, shaking or seizure-like events.  There is a question of if she is losing consciousness with these dizzy spells.  Dizzy spells are worse with exertion.  Works for school system.  Pertinent  Medical History   Past Medical History:  Diagnosis Date   Allergy    Anxiety    Chronic bronchitis ([place])    Depression    Headache    Hypoglycemia    Lower back pain    since fall ~ 2012 (05/25/2015)   Migraine    05/25/2015 "probably once/month"   Migraine headache    Pain in left hip    since fall ~ 2012 (05/25/2015)   Pneumonia "several times"     Significant Hospital Events: Including procedures, antibiotic start and stop dates in addition to other pertinent events   11/1-2 admitted, bronch, PTX, chest tube 04/26/2021 right chest tube still with airleak on waterseal 11/6 - Chest tube clamped with recurrence. Returned to suction 11/7 - Chest tube placed on waterseal  Interim History / Subjective:  Tolerated chest tube admitted without problem  Objective   Blood pressure 140/72, pulse (!) 57, temperature 98.2 F (36.8 C), temperature source Oral, resp. rate 14, height 5' (1.524 m), weight 127 kg, SpO2 100 %.        Intake/Output  Summary (Last 24 hours) at 04/30/2021 0834 Last data filed at 04/30/2021 0500 Gross per 24 hour  Intake 204 ml  Output 2 ml  Net 202 ml   Filed Weights   04/24/21 1351  Weight: 127 kg   Physical Exam: General: Obese female has large component of anxiety HEENT: MM pink/moist no JVD lymphadenopathy is Neuro: grossly intact without focal defect CV: Heart sounds are regular PULM: Lungs are clear, right chest tube has been removed   GI: soft, bsx4 active  GU: Extr without urination emities: warm/dry,  edema  Skin: no rashes or lesions   Resolved Hospital Problem list   N/a  Assessment & Plan:  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 O2 as needed Follow-up pathology She has a PET scan ordered for Tuesday, 05/01/2019 Pulmonary critical care will be available as needed    R service 4 days following for chest tube remain christening 7 still have a bed [facility] (right click and "Reselect all SmartList Selections" daily)  Richardson Landry Minor ACNP Acute Care Nurse Practitioner [place] Please consult Amion 04/30/2021, 8:34 AM   Critical care attending attestation note:  Patient seen and examined and relevant ancillary tests reviewed.  I agree with the assessment and plan of  care as outlined by Minor, Grace Bushy, NP . The following reflects my independent critical care time.   Synopsis of assessment and plan: 45 y o F with metastatic lung CA to brain who developed R sided pneumothorax post bronchoscopic biopsy.   Chest tube was removed today, as there was no air leak.  Repeat CXR post removal showed no recurrence of PTX.   Physical exam: General: Middle aged F, lying on the bed HEENT: [place]/AT, eyes anicteric.  Moist mucus membranes Neuro: Alert, awake,  following commands Chest: Clear breath sounds, no wheezes or rhonchi Heart: Regular rate and rhythm, no murmurs or gallops Abdomen: Soft, nontender, nondistended, bowel sounds present Skin: No rash  Labs and images were reviewed  Assessment and plan: Metastatic lung Ca to brain  Iatrogenic R sided pneumothorax post bronchoscopic biopsy  No recurrence of PTX post chest tube removal Patient is on RA Has an appointment with oncology tomorrow at [facility] to discharge from PCCM stand point   [place] for pager If no response to pager, please call [phone] until 7pm After 7pm, Please call E-link [phone]  04/30/2021, 12:13 PM

## 2021-05-01 ENCOUNTER — Ambulatory Visit: Payer: BC Managed Care – PPO | Admitting: Radiation Oncology

## 2021-05-01 ENCOUNTER — Ambulatory Visit: Payer: BC Managed Care – PPO

## 2021-05-01 NOTE — Progress Notes (Signed)
[facility] OFFICE PROGRESS NOTE  Abigail Halim., Abigail Golden [address] [place] [address]  DIAGNOSIS: Stage IV (T2b, Nx, M1) non-small cell lung cancer, adenocarcinoma. She presented with a spiculated left upper lobe lung mass which abuts the medial pleural space metastatic disease to the brain.  Diagnosed in November 2022.  PD-L1 expression: Pending  Molecular studies: Pending  PRIOR THERAPY: None   CURRENT THERAPY: 1) SRS to the metastatic brain lesions under the care of Dr. Tammi Klippel.  Last treatment expected on 05/13/2021.  INTERVAL HISTORY: Abigail Golden 55 y.o. female returns to the clinic today for an outpatient follow-up visit accompanied by her friend from church, [place].  The patient is a never smoker and was recently diagnosed with metastatic lung cancer.  She presented to the emergency room on 04/23/21 after an outpatient CT scan of the head showed suspicious findings for a mass.  The patient had a CT scan performed by her primary care provider which showed 2 suspicious areas of vasogenic edema. The patient presented to the emergency room for a brain MRI and for further work-up.   The patient has a history of migraines which typically are located in the frontal region with associated nausea and photosensitivity.  10 days prior to her admission, she had an unusual headache located in the occipital reason with associated dizziness, nausea, and numbness and tingling around the neck.    While admitted to the hospital, the patient had a MRI which showed metastatic disease to the brain with 2 enhancing brain masses with moderate associated vasogenic edema in the left inferior frontal gyrus and 2 other small 2 to 3 mm metastases in the right inferior frontal gyrus and right posterior temporal lobe.  A CT scan of the chest, abdomen, and pelvis was performed to complete the staging work-up.  The CT scan showed a 4.5 cm mass in the left upper lobe which is suspicious for  primary lung cancer. She underwent a bronchoscopy and biopsy on 04/24/21 under the care of Dr. Valeta Harms.  The final pathology report is pending, but from discussion with pathology this morning, the final pathology is consistent with primary lung adenocarcinoma. We are waiting to see if their is enough tissue for PDL1 and foundation one testing. She is here today for evaluation and more detailed discussion about her current condition and recommended treatment options.  Overall, the patient's headaches have improved since she started decadron. She has not had any recurrent symptoms of headaches, visual changes, or blurry vision. She sometimes feels off balance/wobbly if she moves a certain way or too quickly.  She is currently taking Decadron 4 mg two tablets twice a day. She took her night time dose at 10:30 last night and reports insomnia. She recently got a prescription for Ambien. She previously used hydroxyzine at night and occasionally benadryl.  She is undergoing SRS treatment to the metastatic brain lesions under the care of Dr. Tammi Klippel and her first treatment is expected on 05/08/21 and her last treatment is expected for 05/13/2021.  Otherwise the patient denies any fever, chills, or unexplained weight loss.  She gets hot at night due to the steroids. She noticed her appetite is "not like it used to be" the last few months. She sometimes gets winded when she exerts herself such as walking up ramps or long hallways at work. 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 She submitted FMLA paperwork yesterday. She is interested in getting her flu shot  today to protect herself and students from infection. She received 2 COVID-19 initial series vaccines but was not interested in getting additional ones unless recommend by [place], in which she would be open to getting the remaining. Denies chest pain or hemoptysis. She has a cough which she believes may be related to her allergies. She takes Claritin during  the day and singulare. She nausea, vomiting, diarrhea, or constipation.   MEDICAL HISTORY: Past Medical History:  Diagnosis Date   Allergy    Anxiety    Chronic bronchitis ([place])    Depression    Headache    Hypoglycemia    Lower back pain    since fall ~ 2012 (05/25/2015)   Migraine    05/25/2015 "probably once/month"   Migraine headache    Pain in left hip    since fall ~ 2012 (05/25/2015)   Pneumonia "several times"    ALLERGIES:  is allergic to celebrex [celecoxib] and minocycline.  MEDICATIONS:  Current Outpatient Medications  Medication Sig Dispense Refill   acetaminophen (TYLENOL) 500 MG tablet Take 500 mg by mouth every 6 (six) hours as needed for moderate pain or headache.     albuterol (VENTOLIN HFA) 108 (90 Base) MCG/ACT inhaler Inhale 2 puffs into the lungs every 4 (four) hours as needed.     ALPRAZolam (XANAX) 0.25 MG tablet Take 0.25 mg by mouth every 4 (four) hours as needed for anxiety. for anxiety     aspirin EC 81 MG tablet Take 81 mg by mouth daily. Swallow whole.     citalopram (CELEXA) 10 MG tablet Take 10 mg by mouth at bedtime.     dexamethasone (DECADRON) 2 MG tablet Take 2 tablets (4 mg total) by mouth 2 (two) times daily for 15 days. 60 tablet 0   diclofenac sodium (VOLTAREN) 1 % GEL Apply topically as needed. To affected area     diphenhydrAMINE (BENADRYL) 25 MG tablet Take 25 mg by mouth See admin instructions. 25 mg at bedtime 25 mg as needed for itching     Docusate Sodium (STOOL SOFTENER LAXATIVE PO) Take 1 tablet by mouth as needed.     eletriptan (RELPAX) 20 MG tablet Take 20 mg by mouth as needed for headache (at onset of migraine.). May repeat in 2 hours if needed.     fluticasone (FLONASE) 50 MCG/ACT nasal spray Place 2 sprays into both nostrils daily as needed for allergies.     HYDROcodone-acetaminophen (NORCO/VICODIN) 5-325 MG tablet Take 1 tablet by mouth every 4 (four) hours as needed. 15 tablet 0   hydrOXYzine (ATARAX/VISTARIL) 25 MG tablet  Take 25 mg by mouth See admin instructions. 25 mg at bedtime 25 mg during the day as needed for itching/anxiety     loratadine (CLARITIN) 10 MG tablet Take 10 mg by mouth daily.     montelukast (SINGULAIR) 10 MG tablet Take 10 mg by mouth at bedtime.     ondansetron (ZOFRAN-ODT) 4 MG disintegrating tablet Take 4 mg by mouth every 8 (eight) hours as needed for nausea or vomiting.     pantoprazole (PROTONIX) 40 MG tablet Take 1 tablet (40 mg total) by mouth daily. 30 tablet 0   solifenacin (VESICARE) 10 MG tablet Take 10 mg by mouth every evening.     sulfamethoxazole-trimethoprim (BACTRIM DS) 800-160 MG tablet Take 1 tablet by mouth 3 (three) times a week. 12 tablet 0   zolpidem (AMBIEN) 5 MG tablet Take 1 tablet (5 mg total) by mouth at bedtime as needed  for sleep. 30 tablet 2   No current facility-administered medications for this visit.    SURGICAL HISTORY:  Past Surgical History:  Procedure Laterality Date   ABDOMINAL HYSTERECTOMY  11/2002   "found benign tumors"   BRONCHIAL BIOPSY  04/24/2021   Procedure: BRONCHIAL BIOPSIES;  Surgeon: Garner Nash, DO;  Location: [facility];  Service: Pulmonary;;   BRONCHIAL BRUSHINGS  04/24/2021   Procedure: BRONCHIAL BRUSHINGS;  Surgeon: Garner Nash, DO;  Location: [facility];  Service: Pulmonary;;   BRONCHIAL NEEDLE ASPIRATION BIOPSY  04/24/2021   Procedure: BRONCHIAL NEEDLE ASPIRATION BIOPSIES;  Surgeon: Garner Nash, DO;  Location: [facility];  Service: Pulmonary;;   CHEST TUBE INSERTION  04/24/2021   Procedure: CHEST TUBE INSERTION;  Surgeon: Garner Nash, DO;  Location: [facility];  Service: Pulmonary;;   CHOLECYSTECTOMY N/A 05/25/2015   Procedure: LAPAROSCOPIC CHOLECYSTECTOMY;  Surgeon: Ralene Ok, MD;  Location: [facility];  Service: General;  Laterality: N/A;   LAPAROSCOPIC CHOLECYSTECTOMY  05/25/2015   MENISCUS REPAIR Right 03/2017   VIDEO BRONCHOSCOPY WITH ENDOBRONCHIAL NAVIGATION N/A 04/24/2021   Procedure: VIDEO  BRONCHOSCOPY WITH ENDOBRONCHIAL NAVIGATION;  Surgeon: Garner Nash, DO;  Location: [facility];  Service: Pulmonary;  Laterality: N/A;   VIDEO BRONCHOSCOPY WITH RADIAL ENDOBRONCHIAL ULTRASOUND  04/24/2021   Procedure: VIDEO BRONCHOSCOPY WITH RADIAL ENDOBRONCHIAL ULTRASOUND;  Surgeon: Garner Nash, DO;  Location: [facility];  Service: Pulmonary;;    REVIEW OF SYSTEMS:   Review of Systems  Constitutional: Positive for insomnia. Negative for appetite change, chills, fatigue, fever and unexpected weight change.  HENT:  Negative for mouth sores, nosebleeds, sore throat and trouble swallowing.   Eyes: Negative for eye problems (improved) and icterus.  Respiratory: Positive for mild dyspnea on exertion and mild cough secondary to nasal drainage. Negative for hemoptysis and wheezing.   Cardiovascular: Negative for chest pain and leg swelling.  Gastrointestinal: Negative for abdominal pain, constipation, diarrhea, nausea and vomiting.  Genitourinary: Negative for bladder incontinence, difficulty urinating, dysuria, frequency and hematuria.   Musculoskeletal: Negative for back pain, gait problem, neck pain and neck stiffness.  Skin: Negative for itching and rash.  Neurological: Positive for mild dizziness with certain movements but greatly improved from prior. Negative for extremity weakness, gait problem, headaches (improved), light-headedness and seizures.  Hematological: Negative for adenopathy. Does not bruise/bleed easily.  Psychiatric/Behavioral: Negative for confusion, depression and sleep disturbance. The patient is not nervous/anxious.     PHYSICAL EXAMINATION:  Blood pressure (!) 159/75, pulse 78, temperature 98.2 F (36.8 C), temperature source Temporal, resp. rate 18, height 5' (1.524 m), weight 276 lb 6.4 oz (125.4 kg), SpO2 97 %.  ECOG PERFORMANCE STATUS: 1  Physical Exam  Constitutional: Oriented to person, place, and time and well-developed, well-nourished, and in no  distress.  HENT:  Head: Normocephalic and atraumatic.  Mouth/Throat: Oropharynx is clear and moist. No oropharyngeal exudate.  Eyes: Conjunctivae are normal. Right eye exhibits no discharge. Left eye exhibits no discharge. No scleral icterus.  Neck: Normal range of motion. Neck supple.  Cardiovascular: Normal rate, regular rhythm, normal heart sounds and intact distal pulses.   Pulmonary/Chest: Effort normal and breath sounds normal. No respiratory distress. No wheezes. No rales.  Abdominal: Soft. Bowel sounds are normal. Exhibits no distension and no mass. There is no tenderness.  Musculoskeletal: Normal range of motion. Exhibits no edema.  Lymphadenopathy:    No cervical adenopathy.  Neurological: Alert and oriented to person, place, and time. Exhibits normal muscle tone. Gait normal. Coordination  normal.  Skin: Multiple bruises on hands and arms bilaterally from prior attempted IV access yesterday. Patient is hard stick. Skin is warm and dry. No rash noted. Not diaphoretic. No erythema. No pallor.  Psychiatric: Mood, memory and judgment normal.  Vitals reviewed.  LABORATORY DATA: Lab Results  Component Value Date   WBC 5.5 04/28/2021   HGB 12.0 04/28/2021   HCT 35.6 (L) 04/28/2021   MCV 86.8 04/28/2021   PLT 210 04/28/2021      Chemistry      Component Value Date/Time   NA 133 (L) 04/28/2021 0327   K 4.3 04/28/2021 0327   CL 103 04/28/2021 0327   CO2 21 (L) 04/28/2021 0327   BUN 19 04/28/2021 0327   CREATININE 0.85 04/28/2021 0327      Component Value Date/Time   CALCIUM 8.8 (L) 04/28/2021 0327   ALKPHOS 51 04/28/2021 0327   AST 14 (L) 04/28/2021 0327   ALT 15 04/28/2021 0327   BILITOT 0.7 04/28/2021 0327       RADIOGRAPHIC STUDIES:  DG Chest 1 View  Result Date: 04/28/2021 CLINICAL DATA:  Pneumothorax. EXAM: CHEST  1 VIEW COMPARISON:  Radiograph 04/27/2021 FINDINGS: Significant increase size of right pneumothorax, now moderate in size. A right pigtail catheter  remains in place, however the pigtail is less coiled than on prior exam. Slight increase in right basilar opacity. Left upper lobe mass is again seen. The exam is otherwise unchanged. IMPRESSION: 1. Definite increase in size of right pneumothorax, now moderate in size. A right pigtail catheter remains in place, however the pigtail catheter is less coiled than on prior exam. 2. Slight increase in right basilar opacity likely atelectasis. These results will be called to the ordering clinician or representative by the Radiologist Assistant, and communication documented in the PACS or [employer]. Electronically Signed   By: Keith Rake M.D.   On: 04/28/2021 15:53   DG Chest 1 View  Result Date: 04/24/2021 CLINICAL DATA:  Post bronchoscopy and right chest tube placement. Pneumothorax. EXAM: CHEST  1 VIEW COMPARISON:  Chest CT 04/23/2021 FINDINGS: Left upper lobe mass noted as seen on prior CT. Right chest tube in place. Lucency noted over the right lateral chest and right apex concerning for pneumothorax, but the pleural edge is difficult to visualize on this portable study. Right basilar opacity, likely atelectasis. No visible effusions. IMPRESSION: Right chest tube in place. Lucency over the lateral and upper right hemithorax likely reflects pneumothorax, but pleural edge is difficult to visualize. Left upper lobe mass again noted. Electronically Signed   By: Rolm Baptise M.D.   On: 04/24/2021 18:06   CT HEAD WO CONTRAST (5MM)  Result Date: 04/22/2021 CLINICAL DATA:  Worsening headaches.  Dizziness and lightheaded. EXAM: CT HEAD WITHOUT CONTRAST TECHNIQUE: Contiguous axial images were obtained from the base of the skull through the vertex without intravenous contrast. COMPARISON:  Brain MRI 07/15/2018 FINDINGS: Brain: No abnormality seen affecting the brainstem or cerebellum. There are 2 separate areas of vasogenic edema, 1 within the inferior left frontal region in the other at the left parietal  vertex. There are presumed underlying masses in both of those areas, though they are not clearly depicted. No evidence of hemorrhage. No left-to-right shift. No abnormality seen in the right hemisphere. No extra-axial fluid collection. The differential diagnosis includes venous infarctions and cerebritis. MRI with contrast strongly recommended. Vascular: No abnormal vascular finding. Skull: Normal Sinuses/Orbits: Clear/normal Other: None IMPRESSION: Two separate areas of vasogenic edema within the left  hemisphere, 1 within the inferior frontal region in the other at the left parietal vertex. Metastatic disease would be the most likely cause of this appearance. Other possibilities include venous infarctions and cerebritis. MRI with contrast recommended. Call report in progress. Electronically Signed   By: Nelson Chimes M.D.   On: 04/22/2021 16:07   CT Chest W Contrast  Result Date: 04/23/2021 CLINICAL DATA:  Metastatic disease evaluation. Brain metastases on MRI. EXAM: CT CHEST, ABDOMEN, AND PELVIS WITH CONTRAST TECHNIQUE: Multidetector CT imaging of the chest, abdomen and pelvis was performed following the standard protocol during bolus administration of intravenous contrast. CONTRAST:  62m OMNIPAQUE IOHEXOL 300 MG/ML  SOLN COMPARISON:  05/25/2015 FINDINGS: CT CHEST FINDINGS Cardiovascular: Heart is normal size. Aorta is normal caliber. Mediastinum/Nodes: No mediastinal, hilar, or axillary adenopathy. Trachea and esophagus are unremarkable. Thyroid unremarkable. Lungs/Pleura: 4.5 cm left upper lobe spiculated mass compatible with primary lung cancer. No pleural effusions. This abuts the medial pleural surface and mediastinum. Few small adjacent pre-vascular lymph nodes, not pathologically enlarged based on CT size criteria. Musculoskeletal: Chest wall soft tissues are unremarkable. No acute bony abnormality. CT ABDOMEN PELVIS FINDINGS Hepatobiliary: Insert paddle biliary Pancreas: No focal abnormality or ductal  dilatation. Spleen: No focal abnormality.  Normal size. Adrenals/Urinary Tract: No adrenal abnormality. No focal renal abnormality. No stones or hydronephrosis. Urinary bladder is unremarkable. Stomach/Bowel: Stomach, large and small bowel grossly unremarkable. Vascular/Lymphatic: No evidence of aneurysm or adenopathy. Reproductive: Prior hysterectomy.  No adnexal masses. Other: No free fluid or free air. Musculoskeletal: No acute bony abnormality. IMPRESSION: 4.5 cm spiculated left upper lobe mass most compatible with primary lung cancer. This abuts the medial pleural surface. A few small adjacent pre-vascular mediastinal lymph nodes, none pathologically enlarged. Recommend cardiothoracic surgical and oncologic consultation. No acute findings or evidence of metastatic disease in the abdomen or pelvis. Electronically Signed   By: KRolm BaptiseM.D.   On: 04/23/2021 22:38   MR BRAIN W WO CONTRAST  Result Date: 04/26/2021 CLINICAL DATA:  Metastatic disease to brain.  Lung mass. EXAM: MRI HEAD WITHOUT AND WITH CONTRAST TECHNIQUE: Multiplanar, multiecho pulse sequences of the brain and surrounding structures were obtained without and with intravenous contrast. CONTRAST:  151mGADAVIST GADOBUTROL 1 MMOL/ML IV SOLN COMPARISON:  MRI head 04/23/2021 FINDINGS: Brain: Multiple enhancing lesions the brain compatible with metastatic disease. Cystic ring-enhancing mass in the left inferior frontal lobe measures 25 x 18 mm with moderate surrounding vasogenic edema. 12.5 mm near solid enhancing mass lesion in the left posterior parietal lobe with moderate surrounding edema. 4 mm enhancing lesion right posterior temporal lobe without significant edema. 4 mm right medial frontal lesion over the convexity. Series 1100 image 248 3 mm enhancing lesion left medial frontal cortex. Series 1100, image 248 Question small 2-3 mm enhancing lesion right inferior frontal lobe. This is more convincing on the prior study. This is adjacent to  the orbit but is likely a metastatic deposit. Ventricle size normal. Mild mass-effect on the left frontal horn due to edema. Negative for acute or chronic cerebral ischemia. Negative for intracranial hemorrhage. Vascular: Normal arterial flow voids at the skull base. Skull and upper cervical spine: Negative Sinuses/Orbits: Negative Other: None IMPRESSION: Metastatic disease to the brain as described above and unchanged from the recent study of 04/23/2021. There is edema in the left frontal lobe and left parietal lobe. No areas of hemorrhage. Electronically Signed   By: ChFranchot Gallo.D.   On: 04/26/2021 15:12   MR Brain W  and Wo Contrast  Result Date: 04/23/2021 CLINICAL DATA:  55 year old female with increasing headaches. Multifocal vasogenic edema in the brain on plain head CT yesterday. EXAM: MRI HEAD WITHOUT AND WITH CONTRAST TECHNIQUE: Multiplanar, multiecho pulse sequences of the brain and surrounding structures were obtained without and with intravenous contrast. CONTRAST:  34m GADAVIST GADOBUTROL 1 MMOL/ML IV SOLN COMPARISON:  Head CT 04/22/2021. Brain MRI without contrast 07/15/2018. FINDINGS: Brain: Solid enhancing 16 mm mass in the posterosuperior left parietal lobe with moderate regional vasogenic edema (series 18, image 41 and series 20, image 18). Only mild regional mass effect. Large up to 29 mm rim enhancing cystic or necrotic mass in the left hemisphere at the inferior frontal gyrus junction with the temporal lobe (series 18, image 22 and series 19, image 21. Moderate vasogenic edema in the region but only mild regional mass effect - including on the anterior left temporal tip. Punctate 2-3 mm abnormal enhancement in the posterior right temporal lobe seen on both series 20, image 6 and series 19, image 14. Subtle similar right inferior frontal gyrus lesion best seen on series 18, image 24 and series 20, image 13. No associated edema. And suspicion of 2 similar small nodular areas of  enhancement in the superior frontal gyri abutting the midline. See series 18, image 46 and series 19, images 19 and 17. Less likely these could be dural based. No edema. No dural thickening or enhancement elsewhere. No superimposed restricted diffusion to suggest acute infarction. No midline shift, ventriculomegaly, extra-axial collection or acute intracranial hemorrhage. Cervicomedullary junction and pituitary are within normal limits. Normal background gray and white matter signal. No cortical encephalomalacia or chronic cerebral blood products. Vascular: Major intracranial vascular flow voids in the are stable since 2020. Dominant left vertebral artery. The major dural venous sinuses are enhancing and appear to be patent. Skull and upper cervical spine: Negative. Visualized bone marrow signal is within normal limits. Sinuses/Orbits: Negative. Other: Trace right mastoid fluid. Negative nasopharynx. Negative visible scalp and face. IMPRESSION: 1. Metastatic disease to the brain. Two enhancing brain masses with moderate associated vasogenic edema: solid 16 mm left parietal lobe mass, and a larger 29 mm rim enhancing cystic or necrotic mass in the left inferior frontal gyrus. Two other small 2-3 mm metastases in the right inferior frontal gyrus, right posterior temporal lobe. And questionable two additional punctate metastases in both superior frontal gyri abutting the falx (series 18, image 46). 2. No significant intracranial mass effect. No other intracranial abnormality. Electronically Signed   By: HGenevie AnnM.D.   On: 04/23/2021 06:41   CT Abdomen Pelvis W Contrast  Result Date: 04/23/2021 CLINICAL DATA:  Metastatic disease evaluation. Brain metastases on MRI. EXAM: CT CHEST, ABDOMEN, AND PELVIS WITH CONTRAST TECHNIQUE: Multidetector CT imaging of the chest, abdomen and pelvis was performed following the standard protocol during bolus administration of intravenous contrast. CONTRAST:  854mOMNIPAQUE IOHEXOL 300  MG/ML  SOLN COMPARISON:  05/25/2015 FINDINGS: CT CHEST FINDINGS Cardiovascular: Heart is normal size. Aorta is normal caliber. Mediastinum/Nodes: No mediastinal, hilar, or axillary adenopathy. Trachea and esophagus are unremarkable. Thyroid unremarkable. Lungs/Pleura: 4.5 cm left upper lobe spiculated mass compatible with primary lung cancer. No pleural effusions. This abuts the medial pleural surface and mediastinum. Few small adjacent pre-vascular lymph nodes, not pathologically enlarged based on CT size criteria. Musculoskeletal: Chest wall soft tissues are unremarkable. No acute bony abnormality. CT ABDOMEN PELVIS FINDINGS Hepatobiliary: Insert paddle biliary Pancreas: No focal abnormality or ductal dilatation. Spleen: No focal  abnormality.  Normal size. Adrenals/Urinary Tract: No adrenal abnormality. No focal renal abnormality. No stones or hydronephrosis. Urinary bladder is unremarkable. Stomach/Bowel: Stomach, large and small bowel grossly unremarkable. Vascular/Lymphatic: No evidence of aneurysm or adenopathy. Reproductive: Prior hysterectomy.  No adnexal masses. Other: No free fluid or free air. Musculoskeletal: No acute bony abnormality. IMPRESSION: 4.5 cm spiculated left upper lobe mass most compatible with primary lung cancer. This abuts the medial pleural surface. A few small adjacent pre-vascular mediastinal lymph nodes, none pathologically enlarged. Recommend cardiothoracic surgical and oncologic consultation. No acute findings or evidence of metastatic disease in the abdomen or pelvis. Electronically Signed   By: Rolm Baptise M.D.   On: 04/23/2021 22:38   DG CHEST PORT 1 VIEW  Result Date: 04/30/2021 CLINICAL DATA:  Chest tube removal. EXAM: PORTABLE CHEST 1 VIEW COMPARISON:  04/30/2021 earlier same day FINDINGS: Interval removal of right chest tube. No evidence for right-sided pneumothorax. No right pleural effusion. Suprahilar left lung lesion again noted. The cardiopericardial silhouette is  within normal limits for size. The visualized bony structures of the thorax show no acute abnormality. Telemetry leads overlie the chest. IMPRESSION: No evidence for pneumothorax status post right chest tube removal. Electronically Signed   By: Misty Stanley M.D.   On: 04/30/2021 11:38   DG CHEST PORT 1 VIEW  Result Date: 04/30/2021 CLINICAL DATA:  Pneumothorax EXAM: PORTABLE CHEST 1 VIEW COMPARISON:  Previous studies including the examinations done on 04/29/2021 FINDINGS: Cardiac size is within normal limits. Mediastinum is unremarkable. There is homogeneous opacity in the medial left upper lung fields with no significant interval change. There are no new infiltrates or signs of pulmonary edema. Right chest tube is noted with its tip in the right mid lung fields. There is no evidence of pleural effusion or pneumothorax. IMPRESSION: There is no pneumothorax. There are no new infiltrates or signs of pulmonary edema. Electronically Signed   By: Elmer Picker M.D.   On: 04/30/2021 08:29   DG CHEST PORT 1 VIEW  Result Date: 04/29/2021 CLINICAL DATA:  Right pneumothorax and chest tube EXAM: PORTABLE CHEST 1 VIEW COMPARISON:  04/29/2021 FINDINGS: Artifact overlies the chest. Right chest tube remains in place in the mid to upper chest. No visible residual pleural air. Lungs are well aerated. Left upper lung mass as seen previously. IMPRESSION: No pneumothorax on the right. Lungs well aerated. Redemonstration of previously seen medial left upper lung mass. Electronically Signed   By: Nelson Chimes M.D.   On: 04/29/2021 15:43   DG CHEST PORT 1 VIEW  Result Date: 04/29/2021 CLINICAL DATA:  Pneumothorax follow-up EXAM: PORTABLE CHEST 1 VIEW COMPARISON:  Chest x-ray 04/28/2021 FINDINGS: Right-sided chest tube in place. Interval re-expansion of the right lung with no residual pneumothorax visualized. Stable appearance of a left upper lobe mass. No pleural effusion. IMPRESSION: No pneumothorax visualized.  Left  upper lobe mass. Electronically Signed   By: Ofilia Neas M.D.   On: 04/29/2021 08:22   DG CHEST PORT 1 VIEW  Result Date: 04/27/2021 CLINICAL DATA:  Pneumothorax.  Chest tube EXAM: PORTABLE CHEST 1 VIEW COMPARISON:  04/26/2021 FINDINGS: Pigtail chest tube on the right unchanged. Minimal right apical pneumothorax has improved. Left upper lobe mass density unchanged. Lungs are well aerated and clear.  No infiltrate or effusion. IMPRESSION: Improvement in right apical pneumothorax which is now minimal. Left upper lobe mass lesion unchanged. Electronically Signed   By: Franchot Gallo M.D.   On: 04/27/2021 12:06   DG CHEST PORT  1 VIEW  Result Date: 04/26/2021 CLINICAL DATA:  Pneumothorax, chest pain EXAM: PORTABLE CHEST 1 VIEW COMPARISON:  Previous studies including the examination of 04/25/2021 FINDINGS: There is interval increase in size of right apical pneumothorax which measures approximately 2 cm in the current study. Tip of right chest tube is noted in the right mid lung fields with possible cephalad migration. Alveolar infiltrate/mass in the left upper lung fields has not changed. This may suggest pneumonia and/or neoplastic process. Costophrenic angles are clear. IMPRESSION: There is interval increase in size of right apical pneumothorax measuring 2 cm. Right lung remains well expanded. There is no shift of mediastinum. Alveolar density in the left upper lung fields has not changed significantly Electronically Signed   By: Elmer Picker M.D.   On: 04/26/2021 10:00   DG Chest Port 1 View  Result Date: 04/25/2021 CLINICAL DATA:  Chest tube, pneumothorax EXAM: PORTABLE CHEST 1 VIEW COMPARISON:  Chest x-ray 04/24/2021 FINDINGS: Right-sided chest tube in place. Interval expansion of the right lung with persistent small pneumothorax measuring approximately 12 mm from the apex. Stable masslike opacity in the left upper lung zone. Cardiomediastinal silhouette is unchanged. No significant pleural  effusion visualized. IMPRESSION: Small right pneumothorax, improved since previous study. Electronically Signed   By: Ofilia Neas M.D.   On: 04/25/2021 08:48   DG CHEST PORT 1 VIEW  Result Date: 04/24/2021 CLINICAL DATA:  Follow-up pneumothorax. EXAM: PORTABLE CHEST 1 VIEW COMPARISON:  1-1/2 hours ago. FINDINGS: Pigtail catheter projects over the right mid lower lung zone. Unchanged size of moderate right pneumothorax. Pleural edge is at least 3 cm from the lung apex. There is increasing opacity in the medial right lung base. No mediastinal shift. Low lung volumes with masslike opacity in the left upper lobe unchanged. Otherwise unchanged exam. IMPRESSION: 1. Unchanged size of moderate right pneumothorax. Right chest tube remains in place. 2. Increasing opacity in the medial right lung base, likely atelectasis. 3. Unchanged masslike opacity in the left upper lobe. Electronically Signed   By: Keith Rake M.D.   On: 04/24/2021 19:23   EEG adult  Result Date: 04/24/2021 Derek Jack, MD     04/24/2021  2:05 PM Routine EEG Report KAMEO BAINS is a 55 y.o. female with a history of presyncope who is undergoing an EEG to evaluate for seizures. Report: This EEG was acquired with electrodes placed according to the International 10-20 electrode system (including Fp1, Fp2, F3, F4, C3, C4, P3, P4, O1, O2, T3, T4, T5, T6, A1, A2, Fz, Cz, Pz). The following electrodes were missing or displaced: none. The occipital dominant rhythm was 9 Hz. This activity is reactive to stimulation. Drowsiness was manifested by background fragmentation; deeper stages of sleep were not identified. There was intermittent focal slowing over the left posterior region. There were no interictal epileptiform discharges. There were no electrographic seizures identified. Photic stimulation and hyperventilation were not performed. Impression and clinical correlation: This EEG was obtained while awake and drowsy and is abnormal due to  intermittent focal slowing over the left posterior region, indicative of focal cerebral dysfunction in that area. There were no electrographic seizures observed during this recording. Su Monks, MD [facility] [phone] If 7pm- 7am, please page neurology on call as listed in [place].   DG C-ARM BRONCHOSCOPY  Result Date: 04/24/2021 C-ARM BRONCHOSCOPY: Fluoroscopy was utilized by the requesting physician.  No radiographic interpretation.     ASSESSMENT/PLAN:  This is a very pleasant 55 year old female with:  Stage IV  Non-small cell lung cancer, adenocarcinoma, (T2b, Nx, M1) never smoker.  Diagnosed in November 2022 -The patient presented with new onset worsening occipital headaches which are  different from her baseline migraines. -Head CT scan performed outpatient on 04/22/21 showed 2 separate areas of vasogenic edema within the left hemisphere, consistent with metastatic disease -MRI of the brain showed 2 brain masses with moderate vasogenic edema left parietal lobe and left inferior frontal gyrus.  There were 2 other small metastases in the right frontal gyrus and right posterior temporal lobe and questionable 2 additional punctate metastases in the superior frontal gyrus. -The patient is planning to undergo SRS to the metastatic brain lesions under the care of Dr. Tammi Klippel.  Expected to start on 05/08/21. Last treatment expected for 05/13/2021 -The final pathology report is not finalized, but I spoke to Dr. Vic Ripper in pathology this morning who informed me that the pathology is consistent with primary lung adenocarcinoma. He stated he will write on the final report if there is enough tissue for PDL1 and foundation one testing.  -The patient was seen with Dr. Burr Medico today.  Dr. Burr Medico had a lengthy discussion with the patient today about her current condition and recommended treatment options.  I discussed that the patient's condition is treatable but not curable. -Given that the  patient is a never smoker, we want to ensure that we evaluate if the patient has any targetable mutations to see if she is a candidate for targeted treatment -We will arrange for the patient to have guardant 360 molecular testing performed today as there likely is not enough tissue for foundation 1 testing.  I expect these results in 10-14 days. -Briefly discussed that if she is not a candidate for targeted systemic treatment, that treatment would consist likely of chemotherapy/immunotherapy and/or immunotherapy alone if her PDL1 expression is >50%. I provider her with information on her AVS regarding systemic chemotherapy/immunotherapy, but she understands the treatment plan depends on her molecular studies. If she needs IV treatment, I discussed with her she likely will need a port-a-cath as she is an extremely challenging stick. We will discuss in more detail at a later date if needed.  -We will order a PET scan to complete the staging workup.  -In the meantime, the patient will complete the [place] treatment to the metastatic brain lesions which is expected to be completed on 05/13/2021 -Dr. Burr Medico will see the patient back for a follow-up visit on 04/2221 for a virtual visit for a more detailed discussion about her current condition and recommended treatment based on the molecular studies.   2. Brain Metastases -She has metastatic disease to the brain and is undergoing SRS under the care of Dr. Tammi Klippel. First dose on 05/08/21 -Currently on 4 mg decadron two tablets twice a day. Significant improvement in her neurological complaints but having insomnia.  -Had been taking her second dose of decadron at 10:30 PM with associated insomnia.  -Discussed she can take this earlier in the day to avoid insomnia.  -She is prescribed Ambien. Advised not to take Ambien and hydroxyzine together and pick one or the other. Also knows not to take hydroxyzine and benadryl together.    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 -Bethann Berkshire is now 31 and the patient lives with her and she helps the patient due to her recent diagnosis. Carly has training in CNA work.  -The patient has strong social support at church and has several people  that can drive her to appointments. -Discussed with the patient that we have a Education officer, museum onsite if she ever has any concerns with transportation -The patient submitted FMLA paperwork yesterday.  The patient works as a Careers information officer. -She interested in getting a flu shot today to protect herself as well as her students from infection.  Plan:  -Guardant 360 testing done today -Final pathology report shows insufficient tissue for foundation one testing. I have spoken to pathology today 05/03/21 and have requested PDL1 testing.  -Flu shot given today -Ordered PET scan  -Continue Decadron taper as recommended by radiation oncology -Continue with radiation oncology for [facility] treatment for metastatic disease to the brain -F/U on 11/22 for more detailed discussion about treatment pending results of molecular studies.      Orders Placed This Encounter  Procedures   NM PET Image Initial (PI) Skull Base To Thigh    Standing Status:   Future    Standing Expiration Date:   05/03/2022    Order Specific Question:   If indicated for the ordered procedure, I authorize the administration of a radiopharmaceutical per Radiology protocol    Answer:   Yes    Order Specific Question:   Is the patient pregnant?    Answer:   No    Order Specific Question:   Preferred imaging location?    Answer:   [place], Abigail Golden 05/03/21  Addendum  I have seen the patient, examined her. I agree with the assessment and and plan and have edited the notes.   I reviewed her surgical pathology findings from the bronchoscopy biopsy which showed adenocarcinoma.  The biopsy material was very limited, we requested PD-L1 to be  tested, but not sufficient tissue for next generation sequencing, such as Foundation One. We we will obtain liquid biopsy guardant 360 today, to see if she has targetable mutation, given her relatively young age and no smoking history. She will proceed SBRT radiation to brain mets in the next 2 weeks. I will see her back after Guardant 360 result is back.  I plan to offer targeted therapy if she does have FDA approved mutations.  If not, plan to offer chemo and immunotherapy.  We briefly reviewed above treatment options.  All questions were answered. Steroids tapering per rad/onc.   Truitt Merle  05/03/2021

## 2021-05-02 ENCOUNTER — Other Ambulatory Visit: Payer: Self-pay

## 2021-05-02 ENCOUNTER — Ambulatory Visit
Admission: RE | Admit: 2021-05-02 | Discharge: 2021-05-02 | Disposition: A | Discharge status: Home / Self Care | Payer: BC Managed Care – PPO | Source: Ambulatory Visit | Attending: Radiation Oncology | Admitting: Radiation Oncology

## 2021-05-02 ENCOUNTER — Other Ambulatory Visit: Payer: Self-pay | Admitting: Urology

## 2021-05-02 ENCOUNTER — Ambulatory Visit
Admit: 2021-05-02 | Discharge: 2021-05-02 | Disposition: A | Discharge status: Home / Self Care | Payer: BC Managed Care – PPO | Attending: Radiation Oncology | Admitting: Radiation Oncology

## 2021-05-02 VITALS — BP 125/74 | HR 83 | Temp 97.7°F | Resp 20 | Ht 60.0 in | Wt 274.8 lb

## 2021-05-02 DIAGNOSIS — C3412 Malignant neoplasm of upper lobe, left bronchus or lung: Secondary | ICD-10-CM | POA: Diagnosis not present

## 2021-05-02 DIAGNOSIS — Z510 Encounter for antineoplastic radiation therapy: Secondary | ICD-10-CM | POA: Diagnosis present

## 2021-05-02 DIAGNOSIS — C7931 Secondary malignant neoplasm of brain: Secondary | ICD-10-CM | POA: Diagnosis present

## 2021-05-02 MED ORDER — ZOLPIDEM TARTRATE 5 MG PO TABS: 5.0000 mg | ORAL_TABLET | Freq: Every evening | ORAL | 2 refills | Status: DC | PRN

## 2021-05-02 NOTE — Progress Notes (Signed)
Has armband been applied?  Yes.    Does patient have an allergy to IV contrast dye?: No.   Has patient ever received premedication for IV contrast dye?: No.   Does patient take metformin?: No.  If patient does take metformin when was the last dose: n/a  Date of lab work: April 28, 2021 BUN: 19 CR: 0.85 eGFR: >60  IV site: doesn't need IV per Dr Tammi Klippel.    Vitals:   05/02/21 0848  BP: 125/74  Pulse: 83  Resp: 20  Temp: 97.7 F (36.5 C)  SpO2: 98%  Weight: 274 lb 12.8 oz (124.6 kg)  Height: 5' (1.524 m)

## 2021-05-02 NOTE — Progress Notes (Signed)
  Radiation Oncology         [phone] ________________________________  Name: Abigail Golden MRN: [id number]  Date: 05/02/2021  DOB: [birth year]  SIMULATION AND TREATMENT PLANNING NOTE    ICD-10-CM   1. Brain metastases ([place])  C79.31       DIAGNOSIS:  55 yo woman with six brain metastases from left upper lung cancer  NARRATIVE:  The patient was brought to the [facility].  Identity was confirmed.  All relevant records and images related to the planned course of therapy were reviewed.  The patient freely provided informed written consent to proceed with treatment after reviewing the details related to the planned course of therapy. The consent form was witnessed and verified by the simulation staff. Intravenous access was established for contrast administration. Then, the patient was set-up in a stable reproducible supine position for radiation therapy.  A relocatable thermoplastic stereotactic head frame was fabricated for precise immobilization.  CT images were obtained.  Surface markings were placed.  The CT images were loaded into the planning software and fused with the patient's targeting MRI scan.  Then the target and avoidance structures were contoured.  Treatment planning then occurred.  The radiation prescription was entered and confirmed.  I have requested 3D planning  I have requested a DVH of the following structures: Brain stem, brain, left eye, right eye, lenses, optic chiasm, target volumes, uninvolved brain, and normal tissue.    SPECIAL TREATMENT PROCEDURE:  The planned course of therapy using radiation constitutes a special treatment procedure. Special care is required in the management of this patient for the following reasons. This treatment constitutes a Special Treatment Procedure for the following reason: High dose per fraction requiring special monitoring for increased toxicities of treatment including daily imaging.  The special nature of the planned  course of radiotherapy will require increased physician supervision and oversight to ensure patient's safety with optimal treatment outcomes.  This requires extended time and effort.  PLAN:  The patient will receive 27 Gy in 3 fractions to the following 6 targets.  Left Frontal 2.5 cm  Left Parietal 1.3 cm Right Temporal 4 mm Right Frontal 4 mm  Left Frontal 3 mm  Right Frontal 2 mm  ________________________________  Sheral Apley Tammi Klippel, M.D.

## 2021-05-03 ENCOUNTER — Encounter: Payer: Self-pay | Admitting: Physician Assistant

## 2021-05-03 ENCOUNTER — Inpatient Hospital Stay: Payer: BC Managed Care – PPO

## 2021-05-03 ENCOUNTER — Other Ambulatory Visit: Payer: Self-pay | Admitting: Physician Assistant

## 2021-05-03 ENCOUNTER — Inpatient Hospital Stay: Payer: BC Managed Care – PPO | Admitting: Physician Assistant

## 2021-05-03 ENCOUNTER — Ambulatory Visit: Payer: BC Managed Care – PPO | Admitting: Radiation Oncology

## 2021-05-03 VITALS — BP 159/75 | HR 78 | Temp 98.2°F | Resp 18 | Ht 60.0 in | Wt 276.4 lb

## 2021-05-03 DIAGNOSIS — F32A Depression, unspecified: Secondary | ICD-10-CM | POA: Diagnosis not present

## 2021-05-03 DIAGNOSIS — Z7982 Long term (current) use of aspirin: Secondary | ICD-10-CM | POA: Diagnosis not present

## 2021-05-03 DIAGNOSIS — C3490 Malignant neoplasm of unspecified part of unspecified bronchus or lung: Secondary | ICD-10-CM | POA: Diagnosis not present

## 2021-05-03 DIAGNOSIS — Z23 Encounter for immunization: Secondary | ICD-10-CM | POA: Diagnosis not present

## 2021-05-03 DIAGNOSIS — Z7952 Long term (current) use of systemic steroids: Secondary | ICD-10-CM | POA: Diagnosis not present

## 2021-05-03 DIAGNOSIS — C3412 Malignant neoplasm of upper lobe, left bronchus or lung: Secondary | ICD-10-CM | POA: Insufficient documentation

## 2021-05-03 DIAGNOSIS — Z79899 Other long term (current) drug therapy: Secondary | ICD-10-CM | POA: Diagnosis not present

## 2021-05-03 DIAGNOSIS — C7931 Secondary malignant neoplasm of brain: Secondary | ICD-10-CM | POA: Diagnosis not present

## 2021-05-03 DIAGNOSIS — J42 Unspecified chronic bronchitis: Secondary | ICD-10-CM | POA: Diagnosis not present

## 2021-05-03 LAB — CYTOLOGY - NON PAP

## 2021-05-03 MED ORDER — INFLUENZA VAC SPLIT QUAD 0.5 ML IM SUSY
0.5000 mL | PREFILLED_SYRINGE | Freq: Once | INTRAMUSCULAR | Status: AC
  Administered 2021-05-03: 0.5 mL via INTRAMUSCULAR
  Filled 2021-05-03: qty 0.5

## 2021-05-03 NOTE — Patient Instructions (Addendum)
Summary:  -There are two main categories of lung cancer, they are named based on the size of the cancer cell. One is called Non-Small cell lung cancer. The other type is Small Cell Lung Cancer -The sample (biopsy) that they took of your tumor was consistent with a subtype of Non-small cell lung cancer called Adenocarcinoma. This is the most common type of lung cancer, especially in women, asian decent, and never/light smokers.  -We covered a lot of important information at your appointment today regarding what the treatment plan is moving forward. Here are the the main points that were discussed at your office visit with [place] today:  -The most important thing that we need to do before determining the best treatment for you, is to run special genetic tests. There are two ways to do this. We can run the test on the sample that Dr. Valeta Harms got from the bronchoscopy while you were in the hospital and/or do a blood test that looks for tumor DNA in the blood to test for certain mutations. We are trying to figure out if we have a good enough sample from the tissue that Dr. Valeta Harms got to run these special tests (I am waiting to hear from the pathologist). If not, we are going to go ahead and run the blood test today so we don't delay anything. This test takes about 10-14 days to come back.  -This blood test helps [place] see if you have a marker/mutation in the tumor DNA that we have treatment for in pill form. If you have a marker that we have a pill for, then that would be the best treatment for you.  -While we wait for the tests to come back, you will be busy with radiation oncology  If you do not have a marker, then treatment would be chemo/immunotherapy. Information Below: -The treatment would be two chemotherapy drugs, called Carboplatin and Alimta (also called Pemetrexed) and one immunotherapy drug called Keytruda (pembrolizumab). This is intravenous (IV) treatment.  -Before starting any IV treatment, we would have  you attend a Chemotherapy Education Class. This involves having you sit down with one of our nurse educators. She will discuss with your one-on-one more details about your treatment as well as general information about resources here at the cancer center.  -This treatment would be given once every 3 weeks. We would check your labs once a week for the first ~5 treatments just to make sure that important components of your blood are in an acceptable range -We will get a CT scan after 3 treatments to check on the progress of treatment  Medications: (If proceeding with IV chemotheapy), I would need to send these to the pharmacy but I will not send now. -Compazine is for nausea. You may take this every 6 hours as needed if you feel nauseous.  -You would need to take 1 mg of folic acid to your pharmacy. 1 tablet every day.  -We would administer B12 injection every 9 weeks.   Referrals or Imaging: -You do need a PET scan to complete the staging workup. This is a special scan that scans you head to knees and helps [place] make sure no other areas are involved with cancer. This is important for [place] for staging purposes. Radiology should call you. Their number is [phone] if you ever need their number  Follow up:  -We will see you back once we have the results of the molecular studies and for a more detailed discussion about your  recommended treatment option.   -If you need to reach [place] at any time, the main office number to the cancer center is [phone].

## 2021-05-03 NOTE — Progress Notes (Signed)
I tried calling patient today.  Final path report was called to me today by pathology.  Positive for adenocarcinoma.  She has appointments already set up to see Dr. Lisbeth Renshaw and Dr. Tammi Klippel next week.  Thanks,  BLI  Garner Nash, DO [facility] 05/03/2021 4:51 PM

## 2021-05-06 ENCOUNTER — Ambulatory Visit: Payer: BC Managed Care – PPO | Admitting: Radiation Oncology

## 2021-05-06 ENCOUNTER — Other Ambulatory Visit: Payer: Self-pay | Admitting: Certified Nurse Midwife

## 2021-05-06 DIAGNOSIS — Z510 Encounter for antineoplastic radiation therapy: Secondary | ICD-10-CM | POA: Diagnosis not present

## 2021-05-06 DIAGNOSIS — Z1231 Encounter for screening mammogram for malignant neoplasm of breast: Secondary | ICD-10-CM

## 2021-05-07 ENCOUNTER — Ambulatory Visit: Payer: BC Managed Care – PPO | Admitting: Radiation Oncology

## 2021-05-08 ENCOUNTER — Other Ambulatory Visit: Payer: Self-pay

## 2021-05-08 ENCOUNTER — Ambulatory Visit
Admission: RE | Admit: 2021-05-08 | Discharge: 2021-05-08 | Disposition: A | Discharge status: Home / Self Care | Payer: BC Managed Care – PPO | Source: Ambulatory Visit | Attending: Radiation Oncology | Admitting: Radiation Oncology

## 2021-05-08 ENCOUNTER — Other Ambulatory Visit: Payer: Self-pay | Admitting: Urology

## 2021-05-08 DIAGNOSIS — Z510 Encounter for antineoplastic radiation therapy: Secondary | ICD-10-CM | POA: Diagnosis not present

## 2021-05-08 MED ORDER — FLUCONAZOLE 100 MG PO TABS
100.0000 mg | ORAL_TABLET | Freq: Every day | ORAL | 1 refills | Status: DC
Start: 2021-05-08 — End: 2021-06-20

## 2021-05-08 NOTE — Progress Notes (Signed)
Patient treated and was brought to nursing for observation 30 minutes.  No complaints or concerns from treatment today.  Reports mouth irritation and what looks like thrush.  No issues swallowing.  Notified PA Ashlyn Bruning, she prescribed antifungal medication.  Patient encouraged increase fluids and to use OTC Biotine mouth rinse for dry mouth.  Nothing else follows.

## 2021-05-09 ENCOUNTER — Ambulatory Visit: Payer: BC Managed Care – PPO | Admitting: Radiation Oncology

## 2021-05-09 ENCOUNTER — Other Ambulatory Visit: Payer: Self-pay | Admitting: Urology

## 2021-05-09 ENCOUNTER — Encounter: Payer: Self-pay | Admitting: Urology

## 2021-05-09 MED ORDER — LORAZEPAM 2 MG PO TABS: 2.0000 mg | ORAL_TABLET | Freq: Every evening | ORAL | 0 refills | Status: DC | PRN

## 2021-05-09 NOTE — Progress Notes (Signed)
Abigail Golden just completed her first fraction of SRS and continues to complain of insomnia despite the Ambien that was prescribed. She reports that it is helping her to fall asleep, but not to stay asleep and questioning whether the dosage needs to be increased to better help with this. Abigail Golden understands that this is caused by the steroids, but wants to know if there is anythting else that might help since she has to remain on steroids until after her radiation treatment is completed on 05/13/21 at which time we will begin a steroid taper but will likely need to go slow with taper given the fairly large size of one of the treated lesions.   I am happy to have her try Ativan instead of the Ambien. I will send a Rx to the pharmacy with instructions to take 30 minutes prior to bedtime but will have our staff call her to make sure that she understands that she should NOT take the Ambien and Ativan together. Steroid insomnia is difficult to treat so it may not be 100% effective but worth a try to see if the Ativan helps her stay asleep longer.  Nicholos Johns, MMS, PA-C [place] [place]: [phone]  Fax: [phone]

## 2021-05-10 ENCOUNTER — Other Ambulatory Visit: Payer: Self-pay | Admitting: Urology

## 2021-05-10 ENCOUNTER — Ambulatory Visit
Admission: RE | Admit: 2021-05-10 | Discharge: 2021-05-10 | Disposition: A | Discharge status: Home / Self Care | Payer: BC Managed Care – PPO | Source: Ambulatory Visit | Attending: Radiation Oncology | Admitting: Radiation Oncology

## 2021-05-10 ENCOUNTER — Other Ambulatory Visit: Payer: Self-pay

## 2021-05-10 VITALS — BP 144/88 | HR 79 | Temp 97.6°F | Resp 20

## 2021-05-10 DIAGNOSIS — Z510 Encounter for antineoplastic radiation therapy: Secondary | ICD-10-CM | POA: Diagnosis not present

## 2021-05-10 DIAGNOSIS — C7931 Secondary malignant neoplasm of brain: Secondary | ICD-10-CM

## 2021-05-10 MED ORDER — NYSTATIN 100000 UNIT/GM EX POWD: 1.0000 "application " | Freq: Three times a day (TID) | CUTANEOUS | 0 refills | Status: DC

## 2021-05-10 NOTE — Progress Notes (Signed)
Patient in for 30 minute observation after treatment SRS brain, no symptoms observed at this time.  Patient had concerns of what appear to be yeast to bilateral inner thighs.  Notified Ashlyn PA, order called in to patient pharmacy of choice.  Nothing else follows.

## 2021-05-10 NOTE — Op Note (Addendum)
Name: Abigail Golden    MRN: [id number]   Date: 05/08/2021    DOB: 11/15/1965   STEREOTACTIC RADIOSURGERY OPERATIVE NOTE  PRE-OPERATIVE DIAGNOSIS: Metastatic adenocarcinoma of the lung, to the brain, multiple lesions (6 lesions)  POST-OPERATIVE DIAGNOSIS:  Same  PROCEDURE:  Stereotactic Radiosurgery  SURGEON:  Elwin Sleight, DO  RADIATION ONCOLOGIST: Dr. Lisbeth Renshaw  TECHNIQUE:  The patient underwent a radiation treatment planning session in the radiation oncology simulation suite under the care of the radiation oncology physician and physicist.  I participated closely in the radiation treatment planning afterwards. The patient underwent planning CT which was fused to 3T high resolution MRI with 1 mm axial slices.  These images were fused on the planning system.  We contoured the gross target volumes and subsequently expanded this to yield the Planning Target Volumes. I actively participated in the planning process.  I helped to define and review the target contours and also the contours of the optic pathway, eyes, brainstem and selected nearby organs at risk.  All the dose constraints for critical structures were reviewed and compared to AAPM Task Group 101.  The prescription dose conformity was reviewed.  I approved the plan electronically.    Accordingly, Abigail Golden  was brought to the TrueBeam stereotactic radiation treatment linac and placed in the custom immobilization mask.  The patient was aligned according to the IR fiducial markers with BrainLab Exactrac, then orthogonal x-rays were used in ExacTrac with the 6DOF robotic table and the shifts were made to align the patient.  Abigail Golden received stereotactic radiosurgery as one of three fractions with a total of 27 Gy to a total of 6 lesions.  The detailed description of the procedure is recorded in the radiation oncology procedure note.  I was present for the duration of the procedure.  DISPOSITION:   Following delivery, the patient  was transported to nursing in stable condition and monitored for possible acute effects to be discharged to home in stable condition with follow-up in one month.  Elwin Sleight, [facility]

## 2021-05-11 NOTE — Addendum Note (Signed)
Encounter addended by: Tyler Pita, MD on: 05/11/2021 11:59 AM  Actions taken: Medication List reviewed, Problem List reviewed, Allergies reviewed

## 2021-05-13 ENCOUNTER — Encounter: Payer: Self-pay | Admitting: Urology

## 2021-05-13 ENCOUNTER — Ambulatory Visit
Admission: RE | Admit: 2021-05-13 | Discharge: 2021-05-13 | Disposition: A | Discharge status: Home / Self Care | Payer: BC Managed Care – PPO | Source: Ambulatory Visit | Attending: Radiation Oncology | Admitting: Radiation Oncology

## 2021-05-13 ENCOUNTER — Other Ambulatory Visit: Payer: Self-pay

## 2021-05-13 ENCOUNTER — Ambulatory Visit (HOSPITAL_COMMUNITY)
Admission: RE | Admit: 2021-05-13 | Discharge: 2021-05-13 | Disposition: A | Discharge status: Home / Self Care | Payer: BC Managed Care – PPO | Source: Ambulatory Visit | Attending: Physician Assistant | Admitting: Physician Assistant

## 2021-05-13 DIAGNOSIS — C3490 Malignant neoplasm of unspecified part of unspecified bronchus or lung: Secondary | ICD-10-CM | POA: Insufficient documentation

## 2021-05-13 DIAGNOSIS — Z510 Encounter for antineoplastic radiation therapy: Secondary | ICD-10-CM | POA: Diagnosis not present

## 2021-05-13 DIAGNOSIS — C7931 Secondary malignant neoplasm of brain: Secondary | ICD-10-CM

## 2021-05-13 LAB — GLUCOSE, CAPILLARY: Glucose-Capillary: 139 mg/dL — ABNORMAL HIGH (ref 70–99)

## 2021-05-13 MED ORDER — FLUDEOXYGLUCOSE F - 18 (FDG) INJECTION
15.0000 | Freq: Once | INTRAVENOUS | Status: AC | PRN
  Administered 2021-05-13: 14.03 via INTRAVENOUS

## 2021-05-13 NOTE — Progress Notes (Signed)
Finished SRS treatment was in nursing for observation 30 minutes no signs or symptoms of distress or pain.  Patient appears to be doing well looks like the Nystatin powder is helping the yeast to the inner thighs and under the abdomen.  Patient request refill on the Nystatin powder for it doesn't last long due to small container and area surface that requires the powder.  Mouth is healing the diflucan is helping along with the Biotine mouthrinse.  Patient and daughter just want to follow-up on order for Decadron taper packet to be ordered, she still has a few more days on current order.  Notified Ashlyn, PA she will follow-up.  Nothing else follows.

## 2021-05-14 ENCOUNTER — Encounter: Payer: Self-pay | Admitting: *Deleted

## 2021-05-14 ENCOUNTER — Inpatient Hospital Stay: Payer: BC Managed Care – PPO | Admitting: Hematology

## 2021-05-14 ENCOUNTER — Other Ambulatory Visit: Payer: Self-pay | Admitting: Urology

## 2021-05-14 ENCOUNTER — Other Ambulatory Visit (HOSPITAL_COMMUNITY): Payer: Self-pay

## 2021-05-14 ENCOUNTER — Telehealth: Payer: Self-pay | Admitting: *Deleted

## 2021-05-14 DIAGNOSIS — C7931 Secondary malignant neoplasm of brain: Secondary | ICD-10-CM

## 2021-05-14 DIAGNOSIS — C3412 Malignant neoplasm of upper lobe, left bronchus or lung: Secondary | ICD-10-CM | POA: Diagnosis not present

## 2021-05-14 MED ORDER — OSIMERTINIB MESYLATE 80 MG PO TABS
80.0000 mg | ORAL_TABLET | Freq: Every day | ORAL | 2 refills | Status: DC
  Filled 2021-05-14 – 2021-05-20 (×2): qty 30, 30d supply, fill #0
  Filled 2021-06-13: qty 30, 30d supply, fill #1
  Filled 2021-07-11: qty 30, 30d supply, fill #2

## 2021-05-14 MED ORDER — DEXAMETHASONE 2 MG PO TABS: 4.0000 mg | ORAL_TABLET | ORAL | 0 refills | Status: DC

## 2021-05-14 MED ORDER — NYSTATIN 100000 UNIT/GM EX POWD: 1.0000 "application " | Freq: Three times a day (TID) | CUTANEOUS | 1 refills | Status: DC

## 2021-05-14 NOTE — Progress Notes (Signed)
Oncology Nurse Navigator Documentation  Oncology Nurse Navigator Flowsheets 05/14/2021  Abnormal Finding Date 04/22/2021  Confirmed Diagnosis Date 05/24/2021  Diagnosis Status Confirmed Diagnosis Complete  Planned Course of Treatment Radiation;Targeted Therapy  Phase of Treatment Targeted Therapy  Radiation Actual Start Date: 05/02/2021  Radiation Actual End Date: 05/10/2021  Navigator Follow Up Date: 05/16/2021  Navigator Follow Up Reason: Appointment Review  Navigator Location [facility]  Navigator Encounter Type Pathology Review/per Dr. Burr Medico, I followed up with Guardant to get an update on test results. I printed and placed on Dr. Ernestina Penna desk.   Treatment Initiated Date 05/02/2021  Treatment Phase Other  Barriers/Navigation Needs Coordination of Care  Interventions Coordination of Care  Acuity Level 2-Minimal Needs (1-2 Barriers Identified)  Coordination of Care Pathology  Time Spent with Patient 30

## 2021-05-14 NOTE — Progress Notes (Signed)
[facility]   Telephone:([phone]) [phone] Fax:(336) [phone]   Clinic Follow up Note   Patient Care Team: Aletha Halim., PA-C as PCP - General (Family Medicine) Valrie Hart, RN as Oncology Nurse Navigator (Oncology)  Date of Service:  05/15/2021  I connected with Abigail Golden on 05/15/2021 at  2:40 PM EST by video enabled telemedicine visit and verified that I am speaking with the correct person using two identifiers.  I discussed the limitations, risks, security and privacy concerns of performing an evaluation and management service by telephone and the availability of in person appointments. I also discussed with the patient that there may be a patient responsible charge related to this service. The patient expressed understanding and agreed to proceed.   Other persons participating in the visit and their role in the encounter:  a friend (who is a CNA)  Patient's location:  a friend's house (where she will be staying for support) Provider's location:  my office  CHIEF COMPLAINT: f/u of metastatic lung cancer  CURRENT THERAPY:  To start [place]:  Abigail Golden is a 55 y.o. female with   1. 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 Brain MRI showed multifocal metastatic disease. -staging CT CAP 04/23/21 showed a 4.5 cm LUL mass that abuts pleural surface, with no pathologically enlarged lymph nodes or metastatic disease. -bronchoscopy on 04/24/21 under Dr. Valeta Harms showed malignant cells consistent with adenocarcinoma, insufficient tissue for molecular studies. Guardant 360 was obtained and showed EGFR L858R mutation, which predicts good response to EGFR inhibitor. -PET scan 05/13/21 showed LUL mass extending along superior mediastinal border from hilum towards  apex.  No lymph node or other distant metastasis. -I reviewed the molecular study results with them today. Accordingly, she is eligible for Tagrisso which also treat brain metastasis. I discussed the indications and side effects of this medication, specially fatigue, diarrhea, nausea, skin rash, cytopenias it etc.  She voiced good understanding and agrees to proceed. -The goal of therapy is palliative, to prolong her life. -At baseline EKG from September 2022 was normal -I also discussed possible radiation therapy to the primary lung mass if she has good response to [place].   2. Brain Metastases -brain MRI 04/23/21 showed 2 brain masses with moderate vasogenic edema left parietal lobe and left inferior frontal gyrus.  There were 2 other small metastases in the right frontal gyrus and right posterior temporal lobe and questionable 2 additional punctate metastases in the superior frontal gyrus. -she received three fractions of SRS under Dr. Tammi Klippel 11/16-11/21/22. -Currently on 4 mg decadron two tablets twice a day. Significant improvement in her neurological complaints   3. 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 -Bethann Berkshire is now 16 and the patient lives with her and she helps the patient due to her recent diagnosis. Carly has training in CNA work.  -The patient has strong social support at church and has several people that can drive her to appointments. -Discussed with the patient that we have a Education officer, museum onsite if she ever has any concerns with transportation -The patient submitted FMLA paperwork, as she works as a Careers information officer.    Plan:  -I prescribed Tagrisso 43m daily today, she was started as some as she receive it -Lab and follow-up  in 3 weeks   No problem-specific Assessment & Plan notes found for this encounter.    SUMMARY OF ONCOLOGIC HISTORY: Oncology History  Primary adenocarcinoma of upper lobe of left lung  ([facility])  04/22/2021 Imaging   CT HEAD WO CONTRAST   IMPRESSION: Two separate areas of vasogenic edema within the left hemisphere, 1 within the inferior frontal region in the other at the left parietal vertex. Metastatic disease would be the most likely cause of this appearance. Other possibilities include venous infarctions and cerebritis. MRI with contrast recommended.    04/23/2021 Imaging   MR Brain W and Wo Contrast  IMPRESSION: 1. Metastatic disease to the brain. Two enhancing brain masses with moderate associated vasogenic edema: solid 16 mm left parietal lobe mass, and a larger 29 mm rim enhancing cystic or necrotic mass in the left inferior frontal gyrus. Two other small 2-3 mm metastases in the right inferior frontal gyrus, right posterior temporal lobe. And questionable two additional punctate metastases in both superior frontal gyri abutting the falx (series 18, image 46).   2. No significant intracranial mass effect. No other intracranial abnormality.   04/23/2021 Imaging   CT Chest W Contrast  IMPRESSION: 4.5 cm spiculated left upper lobe mass most compatible with primary lung cancer. This abuts the medial pleural surface. A few small adjacent pre-vascular mediastinal lymph nodes, none pathologically enlarged. Recommend cardiothoracic surgical and oncologic consultation.   No acute findings or evidence of metastatic disease in the abdomen or pelvis.   04/24/2021 Cancer Staging   Staging form: Lung, AJCC 8th Edition - Clinical stage from 04/24/2021: Stage IVA (cT2, cN2, pM1b) - Signed by Truitt Merle, MD on 05/14/2021    05/03/2021 Initial Diagnosis   Lung cancer ([facility])   05/08/2021 - 05/13/2021 Radiation Therapy   SRS treatment to the metastatic brain lesions under the care of Dr. Tammi Klippel       INTERVAL HISTORY:  Abigail Golden was contacted for a follow up of metastatic lung cancer. She was last seen by me on 05/03/21 in consultation with PA Cassie.  She  reports she is doing well and tolerated radiation well.   All other systems were reviewed with the patient and are negative.  MEDICAL HISTORY:  Past Medical History:  Diagnosis Date   Allergy    Anxiety    Chronic bronchitis ([place])    Depression    Headache    Hypoglycemia    Lower back pain    since fall ~ 2012 (05/25/2015)   Migraine    05/25/2015 "probably once/month"   Migraine headache    Pain in left hip    since fall ~ 2012 (05/25/2015)   Pneumonia "several times"    SURGICAL HISTORY: Past Surgical History:  Procedure Laterality Date   ABDOMINAL HYSTERECTOMY  11/2002   "found benign tumors"   BRONCHIAL BIOPSY  04/24/2021   Procedure: BRONCHIAL BIOPSIES;  Surgeon: Garner Nash, DO;  Location: [facility];  Service: Pulmonary;;   BRONCHIAL BRUSHINGS  04/24/2021   Procedure: BRONCHIAL BRUSHINGS;  Surgeon: Garner Nash, DO;  Location: [facility];  Service: Pulmonary;;   BRONCHIAL NEEDLE ASPIRATION BIOPSY  04/24/2021   Procedure: BRONCHIAL NEEDLE ASPIRATION BIOPSIES;  Surgeon: Garner Nash, DO;  Location: [facility];  Service: Pulmonary;;   CHEST TUBE INSERTION  04/24/2021   Procedure: CHEST TUBE INSERTION;  Surgeon: Garner Nash, DO;  Location: [facility];  Service: Pulmonary;;   CHOLECYSTECTOMY N/A 05/25/2015   Procedure: LAPAROSCOPIC CHOLECYSTECTOMY;  Surgeon: Anne Hahn  Rosendo Gros, MD;  Location: [facility];  Service: General;  Laterality: N/A;   LAPAROSCOPIC CHOLECYSTECTOMY  05/25/2015   MENISCUS REPAIR Right 03/2017   VIDEO BRONCHOSCOPY WITH ENDOBRONCHIAL NAVIGATION N/A 04/24/2021   Procedure: VIDEO BRONCHOSCOPY WITH ENDOBRONCHIAL NAVIGATION;  Surgeon: Garner Nash, DO;  Location: [facility];  Service: Pulmonary;  Laterality: N/A;   VIDEO BRONCHOSCOPY WITH RADIAL ENDOBRONCHIAL ULTRASOUND  04/24/2021   Procedure: VIDEO BRONCHOSCOPY WITH RADIAL ENDOBRONCHIAL ULTRASOUND;  Surgeon: Garner Nash, DO;  Location: [facility];  Service: Pulmonary;;    I have  reviewed the social history and family history with the patient and they are unchanged from previous note.  ALLERGIES:  is allergic to celebrex [celecoxib] and minocycline.  MEDICATIONS:  Current Outpatient Medications  Medication Sig Dispense Refill   osimertinib mesylate (TAGRISSO) 80 MG tablet Take 1 tablet (80 mg total) by mouth daily. 30 tablet 2   acetaminophen (TYLENOL) 500 MG tablet Take 500 mg by mouth every 6 (six) hours as needed for moderate pain or headache.     albuterol (VENTOLIN HFA) 108 (90 Base) MCG/ACT inhaler Inhale 2 puffs into the lungs every 4 (four) hours as needed.     ALPRAZolam (XANAX) 0.25 MG tablet Take 0.25 mg by mouth every 4 (four) hours as needed for anxiety. for anxiety     aspirin EC 81 MG tablet Take 81 mg by mouth daily. Swallow whole.     citalopram (CELEXA) 10 MG tablet Take 10 mg by mouth at bedtime.     dexamethasone (DECADRON) 2 MG tablet Take 2 tablets (4 mg total) by mouth as directed. Take 2 tablets (5m) every morning and 1 tab (257m in the evening x7 days, then take 1 tab (17m21mtwice daily x10days, then 1 tab qam x7 days and then stop. 60 tablet 0   diclofenac sodium (VOLTAREN) 1 % GEL Apply topically as needed. To affected area     diphenhydrAMINE (BENADRYL) 25 MG tablet Take 25 mg by mouth See admin instructions. 25 mg at bedtime 25 mg as needed for itching     Docusate Sodium (STOOL SOFTENER LAXATIVE PO) Take 1 tablet by mouth as needed.     eletriptan (RELPAX) 20 MG tablet Take 20 mg by mouth as needed for headache (at onset of migraine.). May repeat in 2 hours if needed.     fluconazole (DIFLUCAN) 100 MG tablet Take 1 tablet (100 mg total) by mouth daily. Take two tablets on day 1 and then one tablet daily thereafter for a total of 7 days 8 tablet 1   fluticasone (FLONASE) 50 MCG/ACT nasal spray Place 2 sprays into both nostrils daily as needed for allergies.     HYDROcodone-acetaminophen (NORCO/VICODIN) 5-325 MG tablet Take 1 tablet by mouth  every 4 (four) hours as needed. 15 tablet 0   hydrOXYzine (ATARAX/VISTARIL) 25 MG tablet Take 25 mg by mouth See admin instructions. 25 mg at bedtime 25 mg during the day as needed for itching/anxiety     loratadine (CLARITIN) 10 MG tablet Take 10 mg by mouth daily.     LORazepam (ATIVAN) 2 MG tablet Take 1 tablet (2 mg total) by mouth at bedtime as needed for sleep (take 1 tablet 30 minutes prior to bedtime and may increase to 1.5 tablets 30 minutes prior to bed if needed). 30 tablet 0   montelukast (SINGULAIR) 10 MG tablet Take 10 mg by mouth at bedtime.     nystatin (MYCOSTATIN/NYSTOP) powder Apply 1 application topically 3 (three) times daily.  30 g 1   ondansetron (ZOFRAN-ODT) 4 MG disintegrating tablet Take 4 mg by mouth every 8 (eight) hours as needed for nausea or vomiting.     pantoprazole (PROTONIX) 40 MG tablet Take 1 tablet (40 mg total) by mouth daily. 30 tablet 0   solifenacin (VESICARE) 10 MG tablet Take 10 mg by mouth every evening.     sulfamethoxazole-trimethoprim (BACTRIM DS) 800-160 MG tablet Take 1 tablet by mouth 3 (three) times a week. 12 tablet 0   zolpidem (AMBIEN) 5 MG tablet Take 1 tablet (5 mg total) by mouth at bedtime as needed for sleep. 30 tablet 2   No current facility-administered medications for this visit.    PHYSICAL EXAMINATION: ECOG PERFORMANCE STATUS: 2 - Symptomatic, <50% confined to bed  There were no vitals filed for this visit. Wt Readings from Last 3 Encounters:  05/03/21 276 lb 6.4 oz (125.4 kg)  05/02/21 274 lb 12.8 oz (124.6 kg)  05/02/21 274 lb 12.8 oz (124.6 kg)    No vitals taken today. Exam not performed, only via video  GENERAL:alert, no distress and comfortable SKIN: skin color normal, no rashes or significant lesions EYES: normal, Conjunctiva are pink and non-injected, sclera clear  NEURO: alert & oriented x 3 with fluent speech  LABORATORY DATA:  I have reviewed the data as listed CBC Latest Ref Rng & Units 04/28/2021 04/27/2021  04/26/2021  WBC 4.0 - 10.5 K/uL 5.5 6.7 7.5  Hemoglobin 12.0 - 15.0 g/dL 12.0 11.8(L) 11.3(L)  Hematocrit 36.0 - 46.0 % 35.6(L) 35.3(L) 33.7(L)  Platelets 150 - 400 K/uL 210 235 210     CMP Latest Ref Rng & Units 04/28/2021 04/27/2021 04/26/2021  Glucose 70 - 99 mg/dL 160(H) 122(H) 161(H)  BUN 6 - 20 mg/dL _0 Creatinine 0.44 - 1.00 mg/dL 0.85 0.80 0.90  Sodium 135 - 145 mmol/L 133(L) 134(L) 135  Potassium 3.5 - 5.1 mmol/L 4.3 4.5 4.4  Chloride 98 - 111 mmol/L 103 103 104  CO2 22 - 32 mmol/L 21(L) 25 24  Calcium 8.9 - 10.3 mg/dL 8.8(L) 9.0 8.8(L)  Total Protein 6.5 - 8.1 g/dL 6.1(L) 6.6 6.4(L)  Total Bilirubin 0.3 - 1.2 mg/dL 0.7 0.3 0.7  Alkaline Phos 38 - 126 U/L 51 50 50  AST 15 - 41 U/L 14(L) 13(L) 15  ALT 0 - 44 U/L _1 RADIOGRAPHIC STUDIES: I have personally reviewed the radiological images as listed and agreed with the findings in the report. NM PET Image Initial (PI) Skull Base To Thigh  Result Date: 05/14/2021 CLINICAL DATA:  Initial treatment strategy for non-small cell lung cancer with known upper lobe mass and metastatic disease to the brain. EXAM: NUCLEAR MEDICINE PET SKULL BASE TO THIGH TECHNIQUE: 14.03 mCi F-18 FDG was injected intravenously. Full-ring PET imaging was performed from the skull base to thigh after the radiotracer. CT data was obtained and used for attenuation correction and anatomic localization. Fasting blood glucose: 139 mg/dl COMPARISON:  Comparison made with CT of the chest, abdomen and pelvis of April 23, 2021. FINDINGS: Mediastinal blood pool activity: SUV max 1.97 Liver activity: SUV max NA NECK: Intracranial metastases better demonstrated on recent MRI. Signs of vasogenic edema in the LEFT frontal and temporal lobes not well assessed Incidental CT findings: Signs of vasogenic edema with limited assessment in the LEFT frontal and temporal lobes. CHEST: Known LEFT upper lobe mass with spiculated morphology abutting mediastinal fat along  the LEFT mediastinal border (  image 51/4) maximum SUV of 17.7. Spiculation extending towards the LEFT lung apex abutting pleural surface. Inseparable from the superior aspect of the LEFT hilum. No adenopathy in the chest. Incidental CT findings: Noncontrast appearance of the heart great vessels is unremarkable. No effusion. No consolidative changes. Airways are patent. ABDOMEN/PELVIS: No abnormal hypermetabolic activity within the liver, pancreas, adrenal glands, or spleen. No hypermetabolic lymph nodes in the abdomen or pelvis. Incidental CT findings: Post cholecystectomy. No acute findings relative to liver, pancreas, spleen, adrenal glands, kidneys, stomach, small or large bowel. No adenopathy in the abdomen. Post hysterectomy without adnexal mass. SKELETON: No focal hypermetabolic activity to suggest skeletal metastasis. Incidental CT findings: none IMPRESSION: LEFT upper lobe mass compatible with bronchogenic neoplasm in this patient with known intracranial metastases. Intracranial findings better displayed on recent MRI of the brain. LEFT upper lobe mass again extends along the superior mediastinal border from the superior aspect of the LEFT hilum with spiculation extending towards the LEFT lung apex. No additional signs of metastatic disease by FDG PET. Electronically Signed   By: Zetta Bills M.D.   On: 05/14/2021 13:15      No orders of the defined types were placed in this encounter.  All questions were answered. The patient knows to call the clinic with any problems, questions or concerns. No barriers to learning was detected. The total time spent in the appointment was 40 minutes.     Truitt Merle, MD 05/15/2021   I, Wilburn Mylar, am acting as scribe for Truitt Merle, MD.   I have reviewed the above documentation for accuracy and completeness, and I agree with the above.

## 2021-05-14 NOTE — Telephone Encounter (Signed)
05/08/2021 several phone calls received checking status of [employer] Critical ILLness Claim and [id number] FMLA forms received 05/06/2021 by forms nurse.   Spoke with [name], provided employee sections to complete and sign along with [facility] authorization and release.   Received copy of Advanced Directives; forwarding to H.I.M.    "The school form is due 05/10/2021."  Provided [phone] and [email or web address] with advice to return completed employee information tomorrow.    05/10/2021 Employer FMLA completed and returned awaiting patient return of [facility] form.    05/10/2021 Met with patient in radiation, reviewed [employer] form.  Completed form faxed to [facility].  Original completed forms returned to [name].

## 2021-05-15 ENCOUNTER — Encounter: Payer: Self-pay | Admitting: Hematology

## 2021-05-15 ENCOUNTER — Telehealth: Payer: Self-pay | Admitting: Pharmacist

## 2021-05-15 ENCOUNTER — Other Ambulatory Visit (HOSPITAL_COMMUNITY): Payer: Self-pay

## 2021-05-15 ENCOUNTER — Telehealth: Payer: Self-pay | Admitting: Hematology

## 2021-05-15 ENCOUNTER — Telehealth: Payer: Self-pay | Admitting: Pharmacy Technician

## 2021-05-15 NOTE — Telephone Encounter (Signed)
Oral Oncology Pharmacist Encounter   Received notification from [place] that prior authorization for Tagrisso is required.   PA submitted on CoverMyMeds Key: [id number] Status is pending   Oral Oncology Clinic will continue to follow.   Leron Croak, PharmD, BCPS Hematology/Oncology Clinical Pharmacist [facility] [phone] 05/15/2021 8:16 AM

## 2021-05-15 NOTE — Telephone Encounter (Signed)
Oral Oncology Patient Advocate Encounter   Was successful in obtaining a copay card for [place].  This copay card will make the patients copay $0.00.  The billing information is as follows and has been shared with [place].   RxBin: [id number] PCN: [id number] Member ID: [id number] Group ID: [id number]  Expires 06/22/21  [facility] Patient [place] Phone [phone] Fax [phone] 05/15/2021 11:40 AM

## 2021-05-15 NOTE — Telephone Encounter (Signed)
Oral Oncology Pharmacist Encounter  Prior Authorization for Newman Nip has been approved.    PA# [id number]  Effective dates: 05/15/2021 through 05/15/2022  Patients co-pay is $250  Oral Oncology Clinic will continue to follow.   Leron Croak, PharmD, BCPS Hematology/Oncology Clinical Pharmacist [facility] [phone] 05/15/2021 10:44 AM

## 2021-05-15 NOTE — Telephone Encounter (Addendum)
Oral Oncology Pharmacist Encounter  Received new prescription for Tagrisso (osimertinib) for the treatment of metastatic non-small cell lung cancer, EGFR L858R mutation positive, planned duration until disease progression or unacceptable drug toxicity.  CMP and CBC w/ Diff from 04/28/21 assessed, no relevant lab abnormalities noted. Baseline cardiac strip available from 04/27/21. Prescription dose and frequency assessed for appropriateness. Appropriate for therapy initiation.   Current medication list in Epic reviewed, DDIs with Tagrisso identified: Category C DDI Tagrisso with citalopram, fluconazole as well as ondansetron for risk of Qtc prolongation. Noted patient prescribed fluconazole for 7 day duration, thus should not be currently taking this anymore. Patient only uses ondansetron PRN. Based on medication dispense history patient does still take citalopram.   Evaluated chart and no patient barriers to medication adherence noted.   Patient agreement for treatment documented in MD note on 05/14/21.  Prescription has been e-scribed to the [facility] for benefits analysis and approval.  Oral Oncology Clinic will continue to follow for insurance authorization, copayment issues, initial counseling and start date.  Leron Croak, PharmD, BCPS Hematology/Oncology Clinical Pharmacist [facility] [phone] 05/15/2021 8:15 AM

## 2021-05-15 NOTE — Telephone Encounter (Signed)
Scheduled follow-up appointments per 11/22 los. Patient is aware. 

## 2021-05-17 NOTE — Telephone Encounter (Signed)
Oral Chemotherapy Pharmacist Encounter  I spoke with patient for overview of: Tagrisso for the treatment of metastatic, EGFR mutation-positive (L858R) NSCLC, planned duration until disease progression or unacceptable toxicity.   Counseled patient on administration, dosing, side effects, monitoring, drug-food interactions, safe handling, storage, and disposal.  Patient will take Tagrisso 80 tablets, 1 tablet by mouth once daily, without regard to food.  Tagrisso start date: 05/22/21  Adverse effects include but are not limited to: diarrhea, mouth sores, decreased appetitie, fatigue, dry skin, rash, nail changes, altered cardiac conduction, and decreased blood counts or electrolytes.   Patient will obtain anti diarrheal and alert the office of 4 or more loose stools above baseline.   Reviewed with patient importance of keeping a medication schedule and plan for any missed doses. No barriers to medication adherence identified.  Medication reconciliation performed and medication/allergy list updated.  Insurance authorization for Newman Nip has been obtained. Test claim at the pharmacy revealed copayment $250 for 1st fill of Tagrisso. Copay card obtained for patient bringing copay to $0. This will ship from the [facility] on 05/20/21 to deliver to patient's home on 05/21/21.  Patient informed the pharmacy will reach out 5-7 days prior to needing next fill of Tagrisso to coordinate continued medication acquisition to prevent break in therapy.  All questions answered.  Ms. Scovell voiced understanding and appreciation.   Medication education handout placed in mail for patient. Patient knows to call the office with questions or concerns. Oral Chemotherapy Clinic phone number provided to patient.   Leron Croak, PharmD, BCPS Hematology/Oncology Clinical Pharmacist [facility] [place] [phone] 05/17/2021 10:32 AM

## 2021-05-20 ENCOUNTER — Other Ambulatory Visit: Payer: Self-pay

## 2021-05-20 ENCOUNTER — Other Ambulatory Visit (HOSPITAL_COMMUNITY): Payer: Self-pay

## 2021-05-20 MED ORDER — PROCHLORPERAZINE MALEATE 10 MG PO TABS: 10.0000 mg | ORAL_TABLET | Freq: Four times a day (QID) | ORAL | 0 refills | Status: DC | PRN

## 2021-05-20 NOTE — Progress Notes (Signed)
Per verbal order from Dr. Burr Medico, placed order for Compazine 10mg  Q6hrs PRN #[id number] refills.

## 2021-05-29 ENCOUNTER — Other Ambulatory Visit (HOSPITAL_COMMUNITY): Payer: Self-pay

## 2021-06-03 ENCOUNTER — Other Ambulatory Visit (HOSPITAL_COMMUNITY): Payer: Self-pay

## 2021-06-03 ENCOUNTER — Telehealth: Payer: Self-pay

## 2021-06-03 ENCOUNTER — Other Ambulatory Visit: Payer: Self-pay

## 2021-06-03 MED ORDER — CLINDAMYCIN PHOS-BENZOYL PEROX 1-5 % EX GEL
Freq: Two times a day (BID) | CUTANEOUS | 1 refills | Status: DC
  Filled 2021-06-03: qty 25, 30d supply, fill #0

## 2021-06-03 MED ORDER — MAGIC MOUTHWASH W/LIDOCAINE
5.0000 mL | Freq: Four times a day (QID) | ORAL | 1 refills | Status: DC | PRN
  Filled 2021-06-03: qty 250, 13d supply, fill #0

## 2021-06-03 MED ORDER — NYSTATIN 100000 UNIT/ML MT SUSP
OROMUCOSAL | 1 refills | Status: DC
  Filled 2021-06-03: qty 250, 12d supply, fill #0

## 2021-06-03 NOTE — Progress Notes (Signed)
Send verbal order from Dr. Burr Medico for Magic Mouthwash and Clindomycin gel for face rash.  Called prescription into [facility] to [place].  Prescription will be ready for pickup today or tomorrow.  Called pt to inform her of Dr. Ernestina Penna orders.  Dr. Burr Medico said to continue to take OTC Tylenol for headaches and Hydrocortisone for face itchiness.  Pt has follow-up appt with Dr. Burr Medico on Friday 06/07/2021.

## 2021-06-03 NOTE — Telephone Encounter (Signed)
Pt called c/o side effects of Tagrisso.  Pt has headache x2day, redness & soreness of the tongue, and dry itchy facial rash.  Dr Burr Medico gave verbal order for pt to continue taking Tylenol for headache, called prescription in for Magic Mouthwash and Clindamycin gel for facial rash.  Dr. Burr Medico instructed pt to purchase OTC Hydrocortisone for face to control itching.

## 2021-06-04 ENCOUNTER — Other Ambulatory Visit (HOSPITAL_COMMUNITY): Payer: Self-pay

## 2021-06-06 ENCOUNTER — Ambulatory Visit: Payer: Self-pay | Admitting: Urology

## 2021-06-06 ENCOUNTER — Other Ambulatory Visit: Payer: Self-pay

## 2021-06-06 DIAGNOSIS — C3412 Malignant neoplasm of upper lobe, left bronchus or lung: Secondary | ICD-10-CM

## 2021-06-06 NOTE — Progress Notes (Signed)
Cbc w/diff and cmp ordered

## 2021-06-07 ENCOUNTER — Inpatient Hospital Stay: Payer: BC Managed Care – PPO

## 2021-06-07 ENCOUNTER — Encounter: Payer: Self-pay | Admitting: Hematology

## 2021-06-07 ENCOUNTER — Ambulatory Visit
Admission: RE | Admit: 2021-06-07 | Discharge: 2021-06-07 | Disposition: A | Discharge status: Home / Self Care | Payer: BC Managed Care – PPO | Source: Ambulatory Visit | Attending: Certified Nurse Midwife | Admitting: Certified Nurse Midwife

## 2021-06-07 ENCOUNTER — Ambulatory Visit: Payer: BC Managed Care – PPO

## 2021-06-07 ENCOUNTER — Other Ambulatory Visit: Payer: Self-pay

## 2021-06-07 ENCOUNTER — Inpatient Hospital Stay: Payer: BC Managed Care – PPO | Attending: Hematology | Admitting: Hematology

## 2021-06-07 VITALS — BP 130/117 | HR 93 | Temp 97.9°F | Resp 19 | Ht 60.0 in | Wt 292.4 lb

## 2021-06-07 DIAGNOSIS — C7931 Secondary malignant neoplasm of brain: Secondary | ICD-10-CM | POA: Insufficient documentation

## 2021-06-07 DIAGNOSIS — K1230 Oral mucositis (ulcerative), unspecified: Secondary | ICD-10-CM | POA: Diagnosis not present

## 2021-06-07 DIAGNOSIS — Z79899 Other long term (current) drug therapy: Secondary | ICD-10-CM | POA: Insufficient documentation

## 2021-06-07 DIAGNOSIS — F32A Depression, unspecified: Secondary | ICD-10-CM | POA: Diagnosis not present

## 2021-06-07 DIAGNOSIS — C3412 Malignant neoplasm of upper lobe, left bronchus or lung: Secondary | ICD-10-CM

## 2021-06-07 DIAGNOSIS — Z923 Personal history of irradiation: Secondary | ICD-10-CM | POA: Diagnosis not present

## 2021-06-07 DIAGNOSIS — F419 Anxiety disorder, unspecified: Secondary | ICD-10-CM | POA: Diagnosis not present

## 2021-06-07 DIAGNOSIS — Z1231 Encounter for screening mammogram for malignant neoplasm of breast: Secondary | ICD-10-CM

## 2021-06-07 LAB — CBC WITH DIFFERENTIAL (CANCER CENTER ONLY)
Abs Immature Granulocytes: 0.07 10*3/uL (ref 0.00–0.07)
Basophils Absolute: 0 10*3/uL (ref 0.0–0.1)
Basophils Relative: 0 %
Eosinophils Absolute: 0 10*3/uL (ref 0.0–0.5)
Eosinophils Relative: 0 %
HCT: 32.7 % — ABNORMAL LOW (ref 36.0–46.0)
Hemoglobin: 11 g/dL — ABNORMAL LOW (ref 12.0–15.0)
Immature Granulocytes: 2 %
Lymphocytes Relative: 48 %
Lymphs Abs: 2.2 10*3/uL (ref 0.7–4.0)
MCH: 29.3 pg (ref 26.0–34.0)
MCHC: 33.6 g/dL (ref 30.0–36.0)
MCV: 87.2 fL (ref 80.0–100.0)
Monocytes Absolute: 0.3 10*3/uL (ref 0.1–1.0)
Monocytes Relative: 7 %
Neutro Abs: 1.9 10*3/uL (ref 1.7–7.7)
Neutrophils Relative %: 43 %
Platelet Count: 129 10*3/uL — ABNORMAL LOW (ref 150–400)
RBC: 3.75 MIL/uL — ABNORMAL LOW (ref 3.87–5.11)
RDW: 14 % (ref 11.5–15.5)
WBC Count: 4.5 10*3/uL (ref 4.0–10.5)
nRBC: 0 % (ref 0.0–0.2)

## 2021-06-07 LAB — CMP (CANCER CENTER ONLY)
ALT: 24 U/L (ref 0–44)
AST: 15 U/L (ref 15–41)
Albumin: 3.7 g/dL (ref 3.5–5.0)
Alkaline Phosphatase: 63 U/L (ref 38–126)
Anion gap: 10 (ref 5–15)
BUN: 19 mg/dL (ref 6–20)
CO2: 24 mmol/L (ref 22–32)
Calcium: 9.1 mg/dL (ref 8.9–10.3)
Chloride: 104 mmol/L (ref 98–111)
Creatinine: 0.9 mg/dL (ref 0.44–1.00)
GFR, Estimated: >60 mL/min (ref >60)
Glucose, Bld: 103 mg/dL — ABNORMAL HIGH (ref 70–99)
Potassium: 3.6 mmol/L (ref 3.5–5.1)
Sodium: 138 mmol/L (ref 135–145)
Total Bilirubin: 0.6 mg/dL (ref 0.3–1.2)
Total Protein: 6.9 g/dL (ref 6.5–8.1)

## 2021-06-07 NOTE — Progress Notes (Signed)
[place]   [phone] [phone]   Clinic Follow up Note   Patient Care Team: Aletha Halim., PA-C as PCP - General (Family Medicine) Valrie Hart, RN as Oncology Nurse Navigator (Oncology)  Date of Service:  06/07/2021  CHIEF COMPLAINT: f/u of metastatic lung cancer  CURRENT THERAPY:  Tagrisso, 80 mg daily, started 05/22/21  ASSESSMENT & PLAN:  Abigail Golden is a 55 y.o. female with   1. 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 Brain MRI showed multifocal metastatic disease. -staging CT CAP 04/23/21 showed a 4.5 cm LUL mass that abuts pleural surface, with no pathologically enlarged lymph nodes or metastatic disease. -bronchoscopy on 04/24/21 under Dr. Valeta Harms showed malignant cells consistent with adenocarcinoma, insufficient tissue for molecular studies. Guardant 360 was obtained and showed EGFR L858R mutation, which predicts good response to EGFR inhibitor. -PET scan 05/13/21 showed LUL mass extending along superior mediastinal border from hilum towards apex.  No lymph node or other distant metastasis. -she started Tagrisso on 05/22/21. She tolerates moderately well with mucositis and skin toxicities.  2. Symptom Management: Mucositis, Headache, Facial Rash -secondary to Tagrisso -she uses tylenol for headache. -she was prescribed magic mouthwash for mucositis and clindamycin gel for facial rash on 06/03/21, which helped significantly  -she has now developed dryness and cracking to her hands. We reviewed management    3. Brain Metastases -brain MRI 04/23/21 showed 2 brain masses with moderate vasogenic edema left parietal lobe and left inferior frontal gyrus.  There were 2 other small metastases in the right frontal gyrus and right posterior temporal  lobe and questionable 2 additional punctate metastases in the superior frontal gyrus. -she received three fractions of SRS under Dr. Tammi Klippel 11/16-11/21/22. -she completed her course of decadron.   4. 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 [name] is now 42 and the patient lives with her and she helps the patient due to her recent diagnosis. 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  -she is in touch with her father, [name], but [name] describes him as being unable to fully comprehend her situation. -The patient has strong social support at church and has several people that can drive her to appointments. -Discussed with the patient that we have a Education officer, museum onsite if she ever has any concerns with transportation -The patient submitted FMLA paperwork, as she works as a Careers information officer.     Plan:  -continue Tagrisso 92m daily -Lab and follow-up in 4 weeks   No problem-specific Assessment & Plan notes found for this encounter.   SUMMARY OF ONCOLOGIC HISTORY: Oncology History  Primary adenocarcinoma of upper lobe of left lung [place]  04/22/2021 Imaging   CT HEAD WO CONTRAST   IMPRESSION: Two separate areas of vasogenic edema within the left hemisphere, 1 within the inferior frontal region in the other at the left parietal vertex. Metastatic disease would be the most likely cause of this appearance. Other possibilities include venous infarctions and cerebritis. MRI with contrast recommended.    04/23/2021 Imaging   MR Brain W and Wo Contrast  IMPRESSION: 1. Metastatic disease to the brain. Two enhancing brain masses with moderate associated vasogenic edema: solid 16 mm left parietal lobe mass, and a larger 29 mm rim enhancing cystic or necrotic mass in the left  inferior frontal gyrus. Two other small 2-3 mm metastases in the right inferior frontal gyrus, right posterior temporal lobe. And questionable two additional  punctate metastases in both superior frontal gyri abutting the falx (series 18, image 46).   2. No significant intracranial mass effect. No other intracranial abnormality.   04/23/2021 Imaging   CT Chest W Contrast  IMPRESSION: 4.5 cm spiculated left upper lobe mass most compatible with primary lung cancer. This abuts the medial pleural surface. A few small adjacent pre-vascular mediastinal lymph nodes, none pathologically enlarged. Recommend cardiothoracic surgical and oncologic consultation.   No acute findings or evidence of metastatic disease in the abdomen or pelvis.   04/24/2021 Cancer Staging   Staging form: Lung, AJCC 8th Edition - Clinical stage from 04/24/2021: Stage IVA (cT2, cN2, pM1b) - Signed by Truitt Merle, MD on 05/14/2021    05/03/2021 Initial Diagnosis   Lung cancer ([place])   05/08/2021 - 05/13/2021 Radiation Therapy   SRS treatment to the metastatic brain lesions under the care of Dr. Tammi Klippel       INTERVAL HISTORY:  Abigail Golden is here for a follow up of metastatic lung cancer. She was last seen by me on 04/25/21 in consultation, with phone visit in the interim. She presents to the clinic accompanied by her friend [name]. She experienced mucositis, headache, and facial rash. I prescribed magic mouthwash and clindamycin gel. She reports these have helped. She reports new dryness and cracking to her hands   All other systems were reviewed with the patient and are negative.  MEDICAL HISTORY:  Past Medical History:  Diagnosis Date   Allergy    Anxiety    Chronic bronchitis ([place])    Depression    Headache    Hypoglycemia    Lower back pain    since fall ~ 2012 (05/25/2015)   Migraine    05/25/2015 "probably once/month"   Migraine headache    Pain in left hip    since fall ~ 2012 (05/25/2015)   Pneumonia "several times"    SURGICAL HISTORY: Past Surgical History:  Procedure Laterality Date   ABDOMINAL HYSTERECTOMY  11/2002   "found benign tumors"    BRONCHIAL BIOPSY  04/24/2021   Procedure: BRONCHIAL BIOPSIES;  Surgeon: Garner Nash, DO;  Location: [facility];  Service: Pulmonary;;   BRONCHIAL BRUSHINGS  04/24/2021   Procedure: BRONCHIAL BRUSHINGS;  Surgeon: Garner Nash, DO;  Location: [facility];  Service: Pulmonary;;   BRONCHIAL NEEDLE ASPIRATION BIOPSY  04/24/2021   Procedure: BRONCHIAL NEEDLE ASPIRATION BIOPSIES;  Surgeon: Garner Nash, DO;  Location: [facility];  Service: Pulmonary;;   CHEST TUBE INSERTION  04/24/2021   Procedure: CHEST TUBE INSERTION;  Surgeon: Garner Nash, DO;  Location: [facility];  Service: Pulmonary;;   CHOLECYSTECTOMY N/A 05/25/2015   Procedure: LAPAROSCOPIC CHOLECYSTECTOMY;  Surgeon: Ralene Ok, MD;  Location: [facility];  Service: General;  Laterality: N/A;   LAPAROSCOPIC CHOLECYSTECTOMY  05/25/2015   MENISCUS REPAIR Right 03/2017   VIDEO BRONCHOSCOPY WITH ENDOBRONCHIAL NAVIGATION N/A 04/24/2021   Procedure: VIDEO BRONCHOSCOPY WITH ENDOBRONCHIAL NAVIGATION;  Surgeon: Garner Nash, DO;  Location: [facility];  Service: Pulmonary;  Laterality: N/A;   VIDEO BRONCHOSCOPY WITH RADIAL ENDOBRONCHIAL ULTRASOUND  04/24/2021   Procedure: VIDEO BRONCHOSCOPY WITH RADIAL ENDOBRONCHIAL ULTRASOUND;  Surgeon: Garner Nash, DO;  Location: [facility];  Service: Pulmonary;;    I have reviewed the social history and family history with the patient and they are unchanged from previous note.  ALLERGIES:  is allergic to celebrex [celecoxib] and minocycline.  MEDICATIONS:  Current Outpatient Medications  Medication Sig Dispense Refill   acetaminophen (TYLENOL) 500 MG tablet Take 500 mg by mouth every 6 (six) hours as needed for moderate pain or headache.     albuterol (VENTOLIN HFA) 108 (90 Base) MCG/ACT inhaler Inhale 2 puffs into the lungs every 4 (four) hours as needed.     ALPRAZolam (XANAX) 0.25 MG tablet Take 0.25 mg by mouth every 4 (four) hours as needed for anxiety. for anxiety     aspirin  EC 81 MG tablet Take 81 mg by mouth daily. Swallow whole.     citalopram (CELEXA) 10 MG tablet Take 10 mg by mouth at bedtime.     clindamycin-benzoyl peroxide (BENZACLIN) gel Apply to face 2 times a day 25 g 1   dexamethasone (DECADRON) 2 MG tablet Take 2 tablets (4 mg total) by mouth as directed. Take 2 tablets (57m) every morning and 1 tab (29m in the evening x7 days, then take 1 tab (82m16mtwice daily x10days, then 1 tab qam x7 days and then stop. 60 tablet 0   diclofenac sodium (VOLTAREN) 1 % GEL Apply topically as needed. To affected area     diphenhydrAMINE (BENADRYL) 25 MG tablet Take 25 mg by mouth See admin instructions. 25 mg at bedtime 25 mg as needed for itching     Docusate Sodium (STOOL SOFTENER LAXATIVE PO) Take 1 tablet by mouth as needed.     eletriptan (RELPAX) 20 MG tablet Take 20 mg by mouth as needed for headache (at onset of migraine.). May repeat in 2 hours if needed.     fluconazole (DIFLUCAN) 100 MG tablet Take 1 tablet (100 mg total) by mouth daily. Take two tablets on day 1 and then one tablet daily thereafter for a total of 7 days 8 tablet 1   fluticasone (FLONASE) 50 MCG/ACT nasal spray Place 2 sprays into both nostrils daily as needed for allergies.     HYDROcodone-acetaminophen (NORCO/VICODIN) 5-325 MG tablet Take 1 tablet by mouth every 4 (four) hours as needed. 15 tablet 0   hydrOXYzine (ATARAX/VISTARIL) 25 MG tablet Take 25 mg by mouth See admin instructions. 25 mg at bedtime 25 mg during the day as needed for itching/anxiety     loratadine (CLARITIN) 10 MG tablet Take 10 mg by mouth daily.     LORazepam (ATIVAN) 2 MG tablet Take 1 tablet (2 mg total) by mouth at bedtime as needed for sleep (take 1 tablet 30 minutes prior to bedtime and may increase to 1.5 tablets 30 minutes prior to bed if needed). 30 tablet 0   magic mouthwash (nystatin, lidocaine, diphenhydrAMINE, alum & mag hydroxide) suspension Swish and swallow 5 mls by mouth every 6 hours as needed 250 mL 1    magic mouthwash w/lidocaine SOLN Take 5 mLs by mouth every 6 hours as needed for mouth pain. Swish and Swallow 250 mL 1   montelukast (SINGULAIR) 10 MG tablet Take 10 mg by mouth at bedtime.     nystatin (MYCOSTATIN/NYSTOP) powder Apply 1 application topically 3 (three) times daily. 30 g 1   ondansetron (ZOFRAN-ODT) 4 MG disintegrating tablet Take 4 mg by mouth every 8 (eight) hours as needed for nausea or vomiting.     osimertinib mesylate (TAGRISSO) 80 MG tablet Take 1 tablet (80 mg total) by mouth daily. 30 tablet 2   pantoprazole (PROTONIX) 40 MG tablet Take 1 tablet (40 mg total) by mouth daily. 30 tablet  0   prochlorperazine (COMPAZINE) 10 MG tablet Take 1 tablet (10 mg total) by mouth every 6 (six) hours as needed for nausea or vomiting. 30 tablet 0   solifenacin (VESICARE) 10 MG tablet Take 10 mg by mouth every evening.     zolpidem (AMBIEN) 5 MG tablet Take 1 tablet (5 mg total) by mouth at bedtime as needed for sleep. 30 tablet 2   No current facility-administered medications for this visit.    PHYSICAL EXAMINATION: ECOG PERFORMANCE STATUS: 3 - Symptomatic, >50% confined to bed  Vitals:   06/07/21 1455  BP: (!) 130/117  Pulse: 93  Resp: 19  Temp: 97.9 F (36.6 C)  SpO2: 99%   Wt Readings from Last 3 Encounters:  06/07/21 292 lb 6.4 oz (132.6 kg)  05/03/21 276 lb 6.4 oz (125.4 kg)  05/02/21 274 lb 12.8 oz (124.6 kg)     GENERAL:alert, no distress and comfortable SKIN: skin color normal, no rashes or significant lesions EYES: normal, Conjunctiva are pink and non-injected, sclera clear  OROPHARYNX:no exudate, no erythema and lips, buccal mucosa, and tongue normal NEURO: alert & oriented x 3 with fluent speech  LABORATORY DATA:  I have reviewed the data as listed CBC Latest Ref Rng & Units 06/07/2021 04/28/2021 04/27/2021  WBC 4.0 - 10.5 K/uL 4.5 5.5 6.7  Hemoglobin 12.0 - 15.0 g/dL 11.0(L) 12.0 11.8(L)  Hematocrit 36.0 - 46.0 % 32.7(L) 35.6(L) 35.3(L)  Platelets 150  - 400 K/uL 129(L) 210 235     CMP Latest Ref Rng & Units 06/07/2021 04/28/2021 04/27/2021  Glucose 70 - 99 mg/dL 103(H) 160(H) 122(H)  BUN 6 - 20 mg/dL '19 19 17  ' Creatinine 0.44 - 1.00 mg/dL 0.90 0.85 0.80  Sodium 135 - 145 mmol/L 138 133(L) 134(L)  Potassium 3.5 - 5.1 mmol/L 3.6 4.3 4.5  Chloride 98 - 111 mmol/L 104 103 103  CO2 22 - 32 mmol/L 24 21(L) 25  Calcium 8.9 - 10.3 mg/dL 9.1 8.8(L) 9.0  Total Protein 6.5 - 8.1 g/dL 6.9 6.1(L) 6.6  Total Bilirubin 0.3 - 1.2 mg/dL 0.6 0.7 0.3  Alkaline Phos 38 - 126 U/L 63 51 50  AST 15 - 41 U/L 15 14(L) 13(L)  ALT 0 - 44 U/L '24 15 14      ' RADIOGRAPHIC STUDIES: I have personally reviewed the radiological images as listed and agreed with the findings in the report. MM 3D SCREEN BREAST BILATERAL  Result Date: 06/07/2021 CLINICAL DATA:  Screening. EXAM: DIGITAL SCREENING BILATERAL MAMMOGRAM WITH TOMOSYNTHESIS AND CAD TECHNIQUE: Bilateral screening digital craniocaudal and mediolateral oblique mammograms were obtained. Bilateral screening digital breast tomosynthesis was performed. The images were evaluated with computer-aided detection. COMPARISON:  Previous exam(s). ACR Breast Density Category b: There are scattered areas of fibroglandular density. FINDINGS: There are no findings suspicious for malignancy. IMPRESSION: No mammographic evidence of malignancy. A result letter of this screening mammogram will be mailed directly to the patient. RECOMMENDATION: Screening mammogram in one year. (Code:SM-B-01Y) BI-RADS CATEGORY  1: Negative. Electronically Signed   By: Lovey Newcomer M.D.   On: 06/07/2021 16:42      Orders Placed This Encounter  Procedures   Ambulatory referral to Physical Therapy    Referral Priority:   Routine    Referral Type:   Physical Medicine    Referral Reason:   Specialty Services Required    Requested Specialty:   Physical Therapy    Number of Visits Requested:   1   All questions were answered. The  patient knows to call  the clinic with any problems, questions or concerns. No barriers to learning was detected. The total time spent in the appointment was 30 minutes.     Truitt Merle, MD 06/07/2021   I, Wilburn Mylar, am acting as scribe for Truitt Merle, MD.   I have reviewed the above documentation for accuracy and completeness, and I agree with the above.

## 2021-06-13 ENCOUNTER — Other Ambulatory Visit (HOSPITAL_COMMUNITY): Payer: Self-pay

## 2021-06-14 ENCOUNTER — Other Ambulatory Visit (HOSPITAL_COMMUNITY): Payer: Self-pay

## 2021-06-19 ENCOUNTER — Encounter: Payer: Self-pay | Admitting: Urology

## 2021-06-19 NOTE — Progress Notes (Signed)
Patient identified using 2 identifiers and reports headaches, and fatigue. No other symptoms reported at this time.   Meaningful use complete.  Hysterectomy- NO chances of pregnancy.  Patient notified of her 4:30pm-06/20/21 telephone appointment w/ Ashlyn Bruning PA-C and verbalized understanding.  Patient preferred contact # [phone]

## 2021-06-20 ENCOUNTER — Other Ambulatory Visit: Payer: Self-pay | Admitting: Radiation Therapy

## 2021-06-20 ENCOUNTER — Ambulatory Visit: Payer: BC Managed Care – PPO | Attending: Hematology | Admitting: Rehabilitation

## 2021-06-20 ENCOUNTER — Other Ambulatory Visit: Payer: Self-pay

## 2021-06-20 ENCOUNTER — Ambulatory Visit
Admission: RE | Admit: 2021-06-20 | Discharge: 2021-06-20 | Disposition: A | Discharge status: Home / Self Care | Payer: BC Managed Care – PPO | Source: Ambulatory Visit | Attending: Radiation Oncology | Admitting: Radiation Oncology

## 2021-06-20 ENCOUNTER — Encounter: Payer: Self-pay | Admitting: Rehabilitation

## 2021-06-20 DIAGNOSIS — C7931 Secondary malignant neoplasm of brain: Secondary | ICD-10-CM | POA: Insufficient documentation

## 2021-06-20 DIAGNOSIS — C3412 Malignant neoplasm of upper lobe, left bronchus or lung: Secondary | ICD-10-CM | POA: Diagnosis present

## 2021-06-20 DIAGNOSIS — M6281 Muscle weakness (generalized): Secondary | ICD-10-CM | POA: Diagnosis present

## 2021-06-20 NOTE — Progress Notes (Signed)
Radiation Oncology         [phone] ________________________________  Name: Abigail Golden MRN: [id number]  Date: 06/20/2021  DOB: 09-12-1965  Post Treatment Note  CC: Aletha Halim., PA-C  Aletha Halim., PA-C  Diagnosis:   55 yo woman with six brain metastases secondary to NSCLC, adenocarcinoma of the left upper lobe lung.  Interval Since Last Radiation:  5.5 weeks  SRS Brain PTV1 - PTV6 05/08/21 - 05/13/21:   Narrative:  I spoke with the patient to conduct her routine scheduled 1 month follow up visit via telephone to spare the patient unnecessary potential exposure in the healthcare setting during the current COVID-19 pandemic.  The patient was notified in advance and gave permission to proceed with this visit format.  She tolerated radiation treatment relatively well without any ill side effects aside from insomnia related to the steroids.                              On review of systems, the patient states that she is doing well in general.  She continues with significant fatigue and generalized weakness but had a physical therapy assessment today and is scheduled to start some physical therapy in [place], close to her home, next week.  She is looking forward to gradually increasing her activities and getting some of her strength back.  She denies any headaches, nausea, vomiting, changes in visual or auditory acuity, tremors or seizure activity.  She does have some intermittent dizziness/imbalance which she associates with the [facility] therapy.  Otherwise, she is pleased with her progress to date.  ALLERGIES:  is allergic to celebrex [celecoxib] and minocycline.  Meds: Current Outpatient Medications  Medication Sig Dispense Refill   acetaminophen (TYLENOL) 500 MG tablet Take 500 mg by mouth every 6 (six) hours as needed for moderate pain or headache.     albuterol (VENTOLIN HFA) 108 (90 Base) MCG/ACT inhaler Inhale 2 puffs into the lungs every 4 (four) hours as  needed.     ALPRAZolam (XANAX) 0.25 MG tablet Take 0.25 mg by mouth every 4 (four) hours as needed for anxiety. for anxiety     aspirin EC 81 MG tablet Take 81 mg by mouth daily. Swallow whole.     citalopram (CELEXA) 10 MG tablet Take 10 mg by mouth at bedtime.     clindamycin-benzoyl peroxide (BENZACLIN) gel Apply to face 2 times a day 25 g 1   dexamethasone (DECADRON) 2 MG tablet Take 2 tablets (4 mg total) by mouth as directed. Take 2 tablets (4mg ) every morning and 1 tab (2mg ) in the evening x7 days, then take 1 tab (2mg ) twice daily x10days, then 1 tab qam x7 days and then stop. 60 tablet 0   diclofenac sodium (VOLTAREN) 1 % GEL Apply topically as needed. To affected area     diphenhydrAMINE (BENADRYL) 25 MG tablet Take 25 mg by mouth See admin instructions. 25 mg at bedtime 25 mg as needed for itching     Docusate Sodium (STOOL SOFTENER LAXATIVE PO) Take 1 tablet by mouth as needed.     eletriptan (RELPAX) 20 MG tablet Take 20 mg by mouth as needed for headache (at onset of migraine.). May repeat in 2 hours if needed.     fluconazole (DIFLUCAN) 100 MG tablet Take 1 tablet (100 mg total) by mouth daily. Take two tablets on day 1 and then one tablet daily thereafter for a total  of 7 days 8 tablet 1   fluticasone (FLONASE) 50 MCG/ACT nasal spray Place 2 sprays into both nostrils daily as needed for allergies.     HYDROcodone-acetaminophen (NORCO/VICODIN) 5-325 MG tablet Take 1 tablet by mouth every 4 (four) hours as needed. 15 tablet 0   hydrOXYzine (ATARAX/VISTARIL) 25 MG tablet Take 25 mg by mouth See admin instructions. 25 mg at bedtime 25 mg during the day as needed for itching/anxiety     loratadine (CLARITIN) 10 MG tablet Take 10 mg by mouth daily.     LORazepam (ATIVAN) 2 MG tablet Take 1 tablet (2 mg total) by mouth at bedtime as needed for sleep (take 1 tablet 30 minutes prior to bedtime and may increase to 1.5 tablets 30 minutes prior to bed if needed). 30 tablet 0   magic mouthwash  (nystatin, lidocaine, diphenhydrAMINE, alum & mag hydroxide) suspension Swish and swallow 5 mls by mouth every 6 hours as needed 250 mL 1   magic mouthwash w/lidocaine SOLN Take 5 mLs by mouth every 6 hours as needed for mouth pain. Swish and Swallow 250 mL 1   montelukast (SINGULAIR) 10 MG tablet Take 10 mg by mouth at bedtime.     nystatin (MYCOSTATIN/NYSTOP) powder Apply 1 application topically 3 (three) times daily. 30 g 1   ondansetron (ZOFRAN-ODT) 4 MG disintegrating tablet Take 4 mg by mouth every 8 (eight) hours as needed for nausea or vomiting.     osimertinib mesylate (TAGRISSO) 80 MG tablet Take 1 tablet (80 mg total) by mouth daily. 30 tablet 2   pantoprazole (PROTONIX) 40 MG tablet Take 1 tablet (40 mg total) by mouth daily. 30 tablet 0   prochlorperazine (COMPAZINE) 10 MG tablet Take 1 tablet (10 mg total) by mouth every 6 (six) hours as needed for nausea or vomiting. 30 tablet 0   solifenacin (VESICARE) 10 MG tablet Take 10 mg by mouth every evening.     zolpidem (AMBIEN) 5 MG tablet Take 1 tablet (5 mg total) by mouth at bedtime as needed for sleep. 30 tablet 2   No current facility-administered medications for this encounter.    Physical Findings:  vitals were not taken for this visit.  Pain Assessment Pain Score: 0-No pain/10 Unable to assess due to telephone follow-up visit format.  Lab Findings: Lab Results  Component Value Date   WBC 4.5 06/07/2021   HGB 11.0 (L) 06/07/2021   HCT 32.7 (L) 06/07/2021   MCV 87.2 06/07/2021   PLT 129 (L) 06/07/2021     Radiographic Findings: MM 3D SCREEN BREAST BILATERAL  Result Date: 06/07/2021 CLINICAL DATA:  Screening. EXAM: DIGITAL SCREENING BILATERAL MAMMOGRAM WITH TOMOSYNTHESIS AND CAD TECHNIQUE: Bilateral screening digital craniocaudal and mediolateral oblique mammograms were obtained. Bilateral screening digital breast tomosynthesis was performed. The images were evaluated with computer-aided detection. COMPARISON:   Previous exam(s). ACR Breast Density Category b: There are scattered areas of fibroglandular density. FINDINGS: There are no findings suspicious for malignancy. IMPRESSION: No mammographic evidence of malignancy. A result letter of this screening mammogram will be mailed directly to the patient. RECOMMENDATION: Screening mammogram in one year. (Code:SM-B-01Y) BI-RADS CATEGORY  1: Negative. Electronically Signed   By: Lovey Newcomer M.D.   On: 06/07/2021 16:42    Impression/Plan: 1. 55 yo woman with six brain metastases secondary to NSCLC, adenocarcinoma of the left upper lobe lung. She appears to have recovered well from the effects of her recent [facility] treatment and is currently without complaints.  We discussed the plan  to obtain a posttreatment MRI brain scan in approximately 2 months to assess her treatment response.  I will plan to follow-up with her by telephone to review the results and recommendations from multidisciplinary brain conference.  Pending this scan is stable, we will then move forward with serial MRI brain scans every 3 months with a telephone follow-up thereafter.  She appears to have a good understanding of these recommendations and is comfortable and in agreement with the stated plan.  She knows that she is welcome to call at anytime in the interim with any questions or concerns related to radiation.    Nicholos Johns, PA-C

## 2021-06-20 NOTE — Progress Notes (Addendum)
°  Radiation Oncology         [phone] ________________________________  Name: Abigail Golden MRN: [id number]  Date: 05/13/2021  DOB: [birth year]  End of Treatment Note  Diagnosis:   55 yo woman with six brain metastases secondary to NSCLC, adenocarcinoma of the left upper lobe lung.     Indication for treatment:  Palliation       Radiation treatment dates:   05/08/21 - 05/13/21  Site/dose/Beams/energy:     Narrative: The patient tolerated radiation treatment relatively well without any ill side effects aside from insomnia related to the steroids.  Plan: The patient has completed radiation treatment. The patient will return to radiation oncology clinic for routine followup in one month. I advised her to call or return sooner if she has any questions or concerns related to her recovery or treatment. ________________________________  Sheral Apley. Tammi Klippel, M.D.

## 2021-06-20 NOTE — Therapy (Signed)
[facility] @ [address], [place], [address] Phone: [phone]   Fax:  [phone]  Physical Therapy Evaluation  Patient Details  Name: Abigail LUMSDEN MRN: [id number] Date of Birth: Jan 25, 1966 Referring Provider (PT): Dr. Burr Medico   Encounter Date: 06/20/2021   PT End of Session - 06/20/21 1422     Visit Number 1    Number of Visits 17    Date for PT Re-Evaluation 08/15/21    PT Start Time 1330    PT Stop Time 1417    PT Time Calculation (min) 47 min    Activity Tolerance Patient tolerated treatment well;Patient limited by fatigue    Behavior During Therapy [facility] for tasks assessed/performed             Past Medical History:  Diagnosis Date   Allergy    Anxiety    Chronic bronchitis ([place])    Depression    Headache    Hypoglycemia    Lower back pain    since fall ~ 2012 (05/25/2015)   Migraine    05/25/2015 "probably once/month"   Migraine headache    Pain in left hip    since fall ~ 2012 (05/25/2015)   Pneumonia "several times"    Past Surgical History:  Procedure Laterality Date   ABDOMINAL HYSTERECTOMY  11/2002   "found benign tumors"   BRONCHIAL BIOPSY  04/24/2021   Procedure: BRONCHIAL BIOPSIES;  Surgeon: Garner Nash, DO;  Location: [place];  Service: Pulmonary;;   BRONCHIAL BRUSHINGS  04/24/2021   Procedure: BRONCHIAL BRUSHINGS;  Surgeon: Garner Nash, DO;  Location: [facility];  Service: Pulmonary;;   BRONCHIAL NEEDLE ASPIRATION BIOPSY  04/24/2021   Procedure: BRONCHIAL NEEDLE ASPIRATION BIOPSIES;  Surgeon: Garner Nash, DO;  Location: [place] [facility];  Service: Pulmonary;;   CHEST TUBE INSERTION  04/24/2021   Procedure: CHEST TUBE INSERTION;  Surgeon: Garner Nash, DO;  Location: [facility];  Service: Pulmonary;;   CHOLECYSTECTOMY N/A 05/25/2015   Procedure: LAPAROSCOPIC CHOLECYSTECTOMY;  Surgeon: Ralene Ok, MD;  Location: [facility];  Service: General;  Laterality: N/A;    LAPAROSCOPIC CHOLECYSTECTOMY  05/25/2015   MENISCUS REPAIR Right 03/2017   VIDEO BRONCHOSCOPY WITH ENDOBRONCHIAL NAVIGATION N/A 04/24/2021   Procedure: VIDEO BRONCHOSCOPY WITH ENDOBRONCHIAL NAVIGATION;  Surgeon: Garner Nash, DO;  Location: [place];  Service: Pulmonary;  Laterality: N/A;   VIDEO BRONCHOSCOPY WITH RADIAL ENDOBRONCHIAL ULTRASOUND  04/24/2021   Procedure: VIDEO BRONCHOSCOPY WITH RADIAL ENDOBRONCHIAL ULTRASOUND;  Surgeon: Garner Nash, DO;  Location: [facility];  Service: Pulmonary;;    There were no vitals filed for this visit.    Subjective Assessment - 06/20/21 1333     Subjective Maybe if I can get stronger    Pertinent History Stage IV NSCLC with brain mets. Radiation to the brain 3 treatments. chemo pill every day. Hx of knee debridement Rt    Limitations House hold activities;Walking;Sitting    How long can you walk comfortably? 67mn    Patient Stated Goals can I build some strength and maybe return to work    Currently in Pain? Yes    Pain Score 4     Pain Location Back    Pain Orientation Upper    Pain Descriptors / Indicators Aching;Burning    Pain Type Acute pain    Pain Onset More than a month ago    Pain Frequency Intermittent    Aggravating Factors  bending over and writing  Pain Relieving Factors good posture                [facility] PT Assessment - 06/20/21 0001       Assessment   Medical Diagnosis non small cell lung cancer with mets to brain    Referring Provider (PT) Dr. Burr Medico    Onset Date/Surgical Date 05/23/21    Hand Dominance Right    [facility] yes [facility]      Precautions   Precaution Comments cancer mets, falls      Restrictions   Weight Bearing Restrictions No      Balance Screen   Has the patient fallen in the past 6 months No    Has the patient had a decrease in activity level because of a fear of falling?  Yes    Is the patient reluctant to leave their home because of a fear of falling?   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  Someone would also have to drive her to work so this is another issue    Leisure not much activity currently      Cognition   Overall Cognitive Status Within Functional Limits for tasks assessed      Functional Tests   Functional tests Sit to Stand      Sit to Stand   Comments 25 seconds completing 3 times before feeling too tired - norm of 15 seconds      ROM / Strength   AROM / PROM / Strength Strength      Strength   Strength Assessment Site Hip;Knee;Ankle;Shoulder;Elbow;Forearm;Wrist;Hand    Right/Left Shoulder Right;Left    Right Shoulder Flexion 3+/5    Right Shoulder ABduction 3+/5    Left Shoulder Flexion 3+/5    Left Shoulder ABduction 3+/5    Right/Left Elbow Left;Right    Right Elbow Flexion 3+/5    Right Elbow Extension 4-/5    Left Elbow Flexion 3+/5    Left Elbow Extension 4-/5    Right/Left hand Right;Left    Right Hand Grip (lbs) 20    Left Hand Grip (lbs) 20    Right/Left Hip Right;Left    Right Hip ABduction 3+/5    Right Hip ADduction 4-/5    Left Hip ABduction 3+/5    Left Hip ADduction 4-/5    Right/Left Knee Right;Left    Right Knee Flexion 3+/5    Right Knee Extension 3+/5    Left Knee Flexion 3+/5    Left Knee Extension 3+/5    Right/Left Ankle Right;Left    Right Ankle Dorsiflexion 3+/5    Left Ankle Dorsiflexion 4+/5  Objective measurements completed on examination: See above findings.       [facility] Adult PT Treatment/Exercise - 06/20/21 0001       Self-Care   Self-Care Other Self-Care Comments    Other Self-Care Comments  discussion regarding the difference between fatigue and cancer related fatigue and how energy can be thought of in "spoons" and that you will only have a finite amout of energy throughout the day which does not increase with napping, etc.  Discussed small amounts of walking and seated TE throughout the day                          PT Long Term Goals - 06/20/21 1428       PT LONG TERM GOAL #1   Title Pt will be independent in her HEP and progression to work through cancer related fatigue    Time 8    Period Weeks    Status New    Target Date 08/15/21      PT LONG TERM GOAL #2   Title Pt will be able to perform sit to stand x 8 in 30 seconds to demonstrate improved mobility and function    Time 8    Period Weeks    Status New    Target Date 08/15/21      PT LONG TERM GOAL #3   Title Pt will be able to return to home independently with modifications as needed safely    Time 8    Period Weeks    Status New    Target Date 08/15/21                    Plan - 06/20/21 1422     Clinical Impression Statement Pt presents with cancer related fatigue and weakness following diagnosis of metastatic lung cancer with mets to the brain.  Pt was treated with radiation to the brain x 3 and is on a daily chemotherapy pill.  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  Currently pt is very weak, fatigues very easily, and is only able to perform sit to stand x 3 before getting too tired.  Pt was seen previously in [place] and will be able to return there  for general strength and conditioning.  Pt will have a low tolerance to TE to start and will need to slowly build up endurance monitoring severity of fatigue post treatment.    Personal Factors and Comorbidities Comorbidity 3+    Comorbidities met lung cancer, brain mets, radiation hx    Examination-Activity Limitations Bathing;Lift;Stand;Locomotion Level;Bend;Stairs    Examination-Participation Restrictions Cleaning;Meal Prep;Yard Work;Community Activity;Occupation;Driving    Stability/Clinical Decision Making Evolving/Moderate complexity    Clinical Decision Making Moderate    Rehab Potential Good    PT Frequency 2x / week    PT Duration 8 weeks    PT Treatment/Interventions ADLs/Self Care Home Management;Therapeutic exercise;Patient/family education;Gait training;Neuromuscular re-education;Balance training;DME Instruction;Stair training    PT Next Visit Plan start general UE/LE therex - low level to start, assess balance as needed    Consulted and Agree with Plan of Care Patient;Family member/caregiver             Patient will benefit from skilled therapeutic intervention in order to improve the following deficits  and impairments:  Difficulty walking, Decreased strength  Visit Diagnosis: 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)  Brain metastases (HCC)  Muscle weakness (generalized)     Problem List Patient Active Problem List   Diagnosis Date Noted   Primary adenocarcinoma of upper lobe of left lung ([place]) 05/03/2021   Lung mass 04/24/2021   Brain metastases ([place]) 04/23/2021   Chronic intractable headache 09/09/2018   Chronic migraine without aura without status migrainosus, not intractable 09/09/2018   Morbid obesity ([place]) 09/09/2018   Medication overuse headache 09/09/2018   Cholecystitis 05/25/2015    Stark Bray, PT 06/20/2021, 2:31 PM  [facility] @ [address] [place], [place],  [address] Phone: [phone]   Fax:  [phone]  Name: MISBAH HORNADAY MRN: [id number] Date of Birth: 22-Mar-1966

## 2021-06-26 ENCOUNTER — Ambulatory Visit: Payer: BC Managed Care – PPO | Attending: Hematology

## 2021-06-26 ENCOUNTER — Other Ambulatory Visit: Payer: Self-pay

## 2021-06-26 DIAGNOSIS — M6281 Muscle weakness (generalized): Secondary | ICD-10-CM | POA: Insufficient documentation

## 2021-06-26 DIAGNOSIS — C3412 Malignant neoplasm of upper lobe, left bronchus or lung: Secondary | ICD-10-CM | POA: Diagnosis present

## 2021-06-26 DIAGNOSIS — C7931 Secondary malignant neoplasm of brain: Secondary | ICD-10-CM | POA: Diagnosis present

## 2021-06-26 NOTE — Therapy (Signed)
[facility] [place], [place], [address] Phone: [phone]   Fax:  [phone]  Physical Therapy Treatment  Patient Details  Name: CYRA SPADER MRN: [id number] Date of Birth: [birth year] Referring Provider (PT): Dr. Burr Medico   Encounter Date: 06/26/2021   PT End of Session - 06/26/21 1349     Visit Number 2    Number of Visits 17    Date for PT Re-Evaluation 08/15/21    PT Start Time 1347    PT Stop Time 1430    PT Time Calculation (min) 43 min    Activity Tolerance Patient tolerated treatment well;Patient limited by fatigue    Behavior During Therapy [facility] for tasks assessed/performed             Past Medical History:  Diagnosis Date   Allergy    Anxiety    Chronic bronchitis ([place])    Depression    Headache    Hypoglycemia    Lower back pain    since fall ~ 2012 (05/25/2015)   Migraine    05/25/2015 "probably once/month"   Migraine headache    Pain in left hip    since fall ~ 2012 (05/25/2015)   Pneumonia "several times"    Past Surgical History:  Procedure Laterality Date   ABDOMINAL HYSTERECTOMY  11/2002   "found benign tumors"   BRONCHIAL BIOPSY  04/24/2021   Procedure: BRONCHIAL BIOPSIES;  Surgeon: Garner Nash, DO;  Location: [facility] [facility];  Service: Pulmonary;;   BRONCHIAL BRUSHINGS  04/24/2021   Procedure: BRONCHIAL BRUSHINGS;  Surgeon: Garner Nash, DO;  Location: [place] [facility];  Service: Pulmonary;;   BRONCHIAL NEEDLE ASPIRATION BIOPSY  04/24/2021   Procedure: BRONCHIAL NEEDLE ASPIRATION BIOPSIES;  Surgeon: Garner Nash, DO;  Location: [facility];  Service: Pulmonary;;   CHEST TUBE INSERTION  04/24/2021   Procedure: CHEST TUBE INSERTION;  Surgeon: Garner Nash, DO;  Location: [facility];  Service: Pulmonary;;   CHOLECYSTECTOMY N/A 05/25/2015   Procedure: LAPAROSCOPIC CHOLECYSTECTOMY;  Surgeon: Ralene Ok, MD;  Location: [facility];  Service: General;  Laterality: N/A;   LAPAROSCOPIC  CHOLECYSTECTOMY  05/25/2015   MENISCUS REPAIR Right 03/2017   VIDEO BRONCHOSCOPY WITH ENDOBRONCHIAL NAVIGATION N/A 04/24/2021   Procedure: VIDEO BRONCHOSCOPY WITH ENDOBRONCHIAL NAVIGATION;  Surgeon: Garner Nash, DO;  Location: [facility];  Service: Pulmonary;  Laterality: N/A;   VIDEO BRONCHOSCOPY WITH RADIAL ENDOBRONCHIAL ULTRASOUND  04/24/2021   Procedure: VIDEO BRONCHOSCOPY WITH RADIAL ENDOBRONCHIAL ULTRASOUND;  Surgeon: Garner Nash, DO;  Location: [facility];  Service: Pulmonary;;    There were no vitals filed for this visit.   Subjective Assessment - 06/26/21 1347     Subjective Patient reports that she is still feeling weak today. She notes that she is no longer doing radiation, but she is still taking a pill for chemotherapy.    Pertinent History Stage IV NSCLC with brain mets. Radiation to the brain 3 treatments. chemo pill every day. Hx of knee debridement Rt    Limitations House hold activities;Walking;Sitting    How long can you walk comfortably? 40mn    Patient Stated Goals can I build some strength and maybe return to work    Currently in Pain? No/denies    Pain Onset More than a month ago                [facility] - [facility]       Assessment   Next MD Visit 07/10/21  [facility] Adult PT Treatment/Exercise - 06/26/21 0001       Exercises   Exercises Knee/Hip;Shoulder      Knee/Hip Exercises: Standing   Other Standing Knee Exercises Sit to stand   3 reps   Other Standing Knee Exercises Walking   1 lap (150 feet)     Knee/Hip Exercises: Seated   Long Arc Quad AROM;Both;2 sets;10 reps    Other Seated Knee/Hip Exercises Heel/toe raises   3 minutes   Marching Both;Other (comment)   2 minutes     Shoulder Exercises: Seated   Retraction Both;Other (comment)   2 minutes                Balance Exercises - 06/26/21 0001       Balance Exercises: Standing   Standing Eyes Opened Wide  (BOA);Solid surface;2 reps;30 secs                PT Education - 06/26/21 1449     Education Details benefits of exercise and regular walking    Person(s) Educated Patient    Methods Explanation    Comprehension Verbalized understanding                 PT Long Term Goals - 06/20/21 1428       PT LONG TERM GOAL #1   Title Pt will be independent in her HEP and progression to work through cancer related fatigue    Time 8    Period Weeks    Status New    Target Date 08/15/21      PT LONG TERM GOAL #2   Title Pt will be able to perform sit to stand x 8 in 30 seconds to demonstrate improved mobility and function    Time 8    Period Weeks    Status New    Target Date 08/15/21      PT LONG TERM GOAL #3   Title Pt will be able to return to home independently with modifications as needed safely    Time 8    Period Weeks    Status New    Target Date 08/15/21                   Plan - 06/26/21 1349     Clinical Impression Statement Patient was introduced to multiple new interventions for improved muscular endurance needed for functional activities. She required minimal cuing for appropriate pacing and for rest breaks throughout treatment to prevent a significant increase in fatigue. Fatigue was her primary limiting factor with today's interventions as she required multiple extended rest breaks throughout treatment. She was educated on the benefits of exercise and how it can gradually improve her feeling of fatigue with her daily activities. She reported feeling tired upon the conclusion of treatment. She continues to require skilled physical therapy to address her remaining impairments to maximize her functional mobility.    Personal Factors and Comorbidities Comorbidity 3+    Comorbidities met lung cancer, brain mets, radiation hx    Examination-Activity Limitations Bathing;Lift;Stand;Locomotion Level;Bend;Stairs    Examination-Participation Restrictions  Cleaning;Meal Prep;Yard Work;Community Activity;Occupation;Driving    Stability/Clinical Decision Making Evolving/Moderate complexity    Rehab Potential Good    PT Frequency 2x / week    PT Duration 8 weeks    PT Treatment/Interventions ADLs/Self Care Home Management;Therapeutic exercise;Patient/family education;Gait training;Neuromuscular re-education;Balance training;DME Instruction;Stair training    PT Next Visit Plan start general UE/LE therex - low level to start, assess balance as  needed    Consulted and Agree with Plan of Care Patient             Patient will benefit from skilled therapeutic intervention in order to improve the following deficits and impairments:  Difficulty walking, Decreased strength  Visit Diagnosis: 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 [place], [place], [address] Phone: [phone]   Fax:  [phone]  Name: CONSTANZA MINCY MRN: [id number] Date of Birth: 1966/05/22

## 2021-06-28 ENCOUNTER — Ambulatory Visit: Payer: BC Managed Care – PPO | Admitting: Physical Therapy

## 2021-07-01 ENCOUNTER — Ambulatory Visit: Payer: BC Managed Care – PPO

## 2021-07-01 ENCOUNTER — Encounter (HOSPITAL_COMMUNITY): Payer: Self-pay

## 2021-07-01 ENCOUNTER — Other Ambulatory Visit: Payer: Self-pay

## 2021-07-01 DIAGNOSIS — C3412 Malignant neoplasm of upper lobe, left bronchus or lung: Secondary | ICD-10-CM | POA: Diagnosis not present

## 2021-07-01 DIAGNOSIS — M6281 Muscle weakness (generalized): Secondary | ICD-10-CM

## 2021-07-01 DIAGNOSIS — C7931 Secondary malignant neoplasm of brain: Secondary | ICD-10-CM

## 2021-07-01 NOTE — Therapy (Signed)
[facility] [place], [place], [address] Phone: [phone]   Fax:  [phone]  Physical Therapy Treatment  Patient Details  Name: Abigail Golden MRN: [id number] Date of Birth: 04/23/1966 Referring Provider (PT): Dr. Burr Medico   Encounter Date: 07/01/2021   PT End of Session - 07/01/21 1347     Visit Number 3    Number of Visits 17    Date for PT Re-Evaluation 08/15/21    PT Start Time 9233    PT Stop Time 1441    PT Time Calculation (min) 56 min    Activity Tolerance Patient tolerated treatment well;Patient limited by fatigue    Behavior During Therapy [facility] for tasks assessed/performed             Past Medical History:  Diagnosis Date   Allergy    Anxiety    Chronic bronchitis ([place])    Depression    Headache    Hypoglycemia    Lower back pain    since fall ~ 2012 (05/25/2015)   Migraine    05/25/2015 "probably once/month"   Migraine headache    Pain in left hip    since fall ~ 2012 (05/25/2015)   Pneumonia "several times"    Past Surgical History:  Procedure Laterality Date   ABDOMINAL HYSTERECTOMY  11/2002   "found benign tumors"   BRONCHIAL BIOPSY  04/24/2021   Procedure: BRONCHIAL BIOPSIES;  Surgeon: Garner Nash, DO;  Location: [place] [facility];  Service: Pulmonary;;   BRONCHIAL BRUSHINGS  04/24/2021   Procedure: BRONCHIAL BRUSHINGS;  Surgeon: Garner Nash, DO;  Location: [place] [facility];  Service: Pulmonary;;   BRONCHIAL NEEDLE ASPIRATION BIOPSY  04/24/2021   Procedure: BRONCHIAL NEEDLE ASPIRATION BIOPSIES;  Surgeon: Garner Nash, DO;  Location: [facility];  Service: Pulmonary;;   CHEST TUBE INSERTION  04/24/2021   Procedure: CHEST TUBE INSERTION;  Surgeon: Garner Nash, DO;  Location: [facility];  Service: Pulmonary;;   CHOLECYSTECTOMY N/A 05/25/2015   Procedure: LAPAROSCOPIC CHOLECYSTECTOMY;  Surgeon: Ralene Ok, MD;  Location: [place] [facility];  Service: General;  Laterality: N/A;   LAPAROSCOPIC  CHOLECYSTECTOMY  05/25/2015   MENISCUS REPAIR Right 03/2017   VIDEO BRONCHOSCOPY WITH ENDOBRONCHIAL NAVIGATION N/A 04/24/2021   Procedure: VIDEO BRONCHOSCOPY WITH ENDOBRONCHIAL NAVIGATION;  Surgeon: Garner Nash, DO;  Location: [facility];  Service: Pulmonary;  Laterality: N/A;   VIDEO BRONCHOSCOPY WITH RADIAL ENDOBRONCHIAL ULTRASOUND  04/24/2021   Procedure: VIDEO BRONCHOSCOPY WITH RADIAL ENDOBRONCHIAL ULTRASOUND;  Surgeon: Garner Nash, DO;  Location: [facility];  Service: Pulmonary;;    There were no vitals filed for this visit.   Subjective Assessment - 07/01/21 1346     Subjective Patient reports that she feels alright today. She notes that she had to take a nap after her last appointment, but she has been working on her HEP at home.    Pertinent History Stage IV NSCLC with brain mets. Radiation to the brain 3 treatments. chemo pill every day. Hx of knee debridement Rt    Limitations House hold activities;Walking;Sitting    How long can you walk comfortably? 59mn    Patient Stated Goals can I build some strength and maybe return to work    Pain Onset More than a month ago                               [facility] - [facility] 0001  Knee/Hip Exercises: Aerobic   Nustep L1 x 10 minutes; seat 8      Knee/Hip Exercises: Standing   Other Standing Knee Exercises Side stepping   3 minutes     Knee/Hip Exercises: Seated   Long Arc Quad Both;2 sets;10 reps    Other Seated Knee/Hip Exercises Heel/toe raises   20 reps each   Other Seated Knee/Hip Exercises Clam   2x10; green t-band   Sit to Sand with UE support;5 reps                          PT Long Term Goals - 06/20/21 1428       PT LONG TERM GOAL #1   Title Pt will be independent in her HEP and progression to work through cancer related fatigue    Time 8    Period Weeks    Status New    Target Date 08/15/21      PT LONG TERM GOAL #2   Title Pt will  be able to perform sit to stand x 8 in 30 seconds to demonstrate improved mobility and function    Time 8    Period Weeks    Status New    Target Date 08/15/21      PT LONG TERM GOAL #3   Title Pt will be able to return to home independently with modifications as needed safely    Time 8    Period Weeks    Status New    Target Date 08/15/21                   Plan - 07/01/21 1347     Clinical Impression Statement Patient was introduced to multiple new interventions for improved lower extremity endurance needed for functional activities. She required minimal cuing with today's new interventions for proper pacing to avoid a significant increase in fatigue. She required occasional rest breaks throughout treatment due to fatigue, but this did not inhibit any of the following activities. She reported feeling good upon the conclusion of treatment. She continues to require skilled physical therapy to address her remaining impairments to maximize her functional mobility.    Personal Factors and Comorbidities Comorbidity 3+    Comorbidities met lung cancer, brain mets, radiation hx    Examination-Activity Limitations Bathing;Lift;Stand;Locomotion Level;Bend;Stairs    Examination-Participation Restrictions Cleaning;Meal Prep;Yard Work;Community Activity;Occupation;Driving    Stability/Clinical Decision Making Evolving/Moderate complexity    Rehab Potential Good    PT Frequency 2x / week    PT Duration 8 weeks    PT Treatment/Interventions ADLs/Self Care Home Management;Therapeutic exercise;Patient/family education;Gait training;Neuromuscular re-education;Balance training;DME Instruction;Stair training    PT Next Visit Plan start general UE/LE therex - low level to start, assess balance as needed    Consulted and Agree with Plan of Care Patient             Patient will benefit from skilled therapeutic intervention in order to improve the following deficits and impairments:  Difficulty  walking, Decreased strength  Visit Diagnosis: 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)  Brain metastases (HCC)  Muscle weakness (generalized)     Problem List Patient Active Problem List   Diagnosis Date Noted   Primary adenocarcinoma of upper lobe of left lung ([place]) 05/03/2021   Lung mass 04/24/2021   Brain metastases ([place]) 04/23/2021   Chronic intractable headache 09/09/2018   Chronic migraine without aura without status migrainosus, not intractable 09/09/2018  Morbid obesity ([place]) 09/09/2018   Medication overuse headache 09/09/2018   Cholecystitis 05/25/2015    Darlin Coco, PT 07/01/2021, 2:55 PM  [facility] [place], [place], [address] Phone: [phone]   Fax:  [phone]  Name: Abigail Golden MRN: [id number] Date of Birth: December 11, 1965

## 2021-07-04 ENCOUNTER — Ambulatory Visit: Payer: BC Managed Care – PPO

## 2021-07-04 ENCOUNTER — Other Ambulatory Visit: Payer: Self-pay

## 2021-07-04 DIAGNOSIS — M6281 Muscle weakness (generalized): Secondary | ICD-10-CM

## 2021-07-04 DIAGNOSIS — C3412 Malignant neoplasm of upper lobe, left bronchus or lung: Secondary | ICD-10-CM

## 2021-07-04 DIAGNOSIS — C7931 Secondary malignant neoplasm of brain: Secondary | ICD-10-CM

## 2021-07-04 NOTE — Therapy (Signed)
[facility] [place], [place], [address] Phone: [phone]   Fax:  [phone]  Physical Therapy Treatment  Patient Details  Name: Abigail Golden MRN: [id number] Date of Birth: [birth year] Referring Provider (PT): Dr. Burr Medico   Encounter Date: 07/04/2021   PT End of Session - 07/04/21 1352     Visit Number 4    Number of Visits 17    Date for PT Re-Evaluation 08/15/21    PT Start Time 1346    PT Stop Time 0258    PT Time Calculation (min) 59 min    Activity Tolerance Patient tolerated treatment well;Patient limited by fatigue    Behavior During Therapy [facility] for tasks assessed/performed             Past Medical History:  Diagnosis Date   Allergy    Anxiety    Chronic bronchitis ([place])    Depression    Headache    Hypoglycemia    Lower back pain    since fall ~ 2012 (05/25/2015)   Migraine    05/25/2015 "probably once/month"   Migraine headache    Pain in left hip    since fall ~ 2012 (05/25/2015)   Pneumonia "several times"    Past Surgical History:  Procedure Laterality Date   ABDOMINAL HYSTERECTOMY  11/2002   "found benign tumors"   BRONCHIAL BIOPSY  04/24/2021   Procedure: BRONCHIAL BIOPSIES;  Surgeon: Garner Nash, DO;  Location: [facility];  Service: Pulmonary;;   BRONCHIAL BRUSHINGS  04/24/2021   Procedure: BRONCHIAL BRUSHINGS;  Surgeon: Garner Nash, DO;  Location: [facility];  Service: Pulmonary;;   BRONCHIAL NEEDLE ASPIRATION BIOPSY  04/24/2021   Procedure: BRONCHIAL NEEDLE ASPIRATION BIOPSIES;  Surgeon: Garner Nash, DO;  Location: [facility];  Service: Pulmonary;;   CHEST TUBE INSERTION  04/24/2021   Procedure: CHEST TUBE INSERTION;  Surgeon: Garner Nash, DO;  Location: [facility];  Service: Pulmonary;;   CHOLECYSTECTOMY N/A 05/25/2015   Procedure: LAPAROSCOPIC CHOLECYSTECTOMY;  Surgeon: Ralene Ok, MD;  Location: [facility];  Service: General;  Laterality: N/A;   LAPAROSCOPIC  CHOLECYSTECTOMY  05/25/2015   MENISCUS REPAIR Right 03/2017   VIDEO BRONCHOSCOPY WITH ENDOBRONCHIAL NAVIGATION N/A 04/24/2021   Procedure: VIDEO BRONCHOSCOPY WITH ENDOBRONCHIAL NAVIGATION;  Surgeon: Garner Nash, DO;  Location: [place];  Service: Pulmonary;  Laterality: N/A;   VIDEO BRONCHOSCOPY WITH RADIAL ENDOBRONCHIAL ULTRASOUND  04/24/2021   Procedure: VIDEO BRONCHOSCOPY WITH RADIAL ENDOBRONCHIAL ULTRASOUND;  Surgeon: Garner Nash, DO;  Location: [facility] [facility];  Service: Pulmonary;;    There were no vitals filed for this visit.   Subjective Assessment - 07/04/21 1347     Subjective Patient reports that she is tired today. She notes that she felt good initially, but she notes that she had her medication and did a few activities. However, this was very tiring and caused her to need a nap. She notes that she was nauseus yesterday.    Pertinent History Stage IV NSCLC with brain mets. Radiation to the brain 3 treatments. chemo pill every day. Hx of knee debridement Rt    Limitations House hold activities;Walking;Sitting    How long can you walk comfortably? 96mn    Patient Stated Goals can I build some strength and maybe return to work    Currently in Pain? No/denies    Pain Onset More than a month ago  [facility] Adult PT Treatment/Exercise - 07/04/21 0001       Knee/Hip Exercises: Stretches   Passive Hamstring Stretch Both;4 reps;30 seconds   seated     Knee/Hip Exercises: Aerobic   Nustep L1 x 11 minutes   multiple rest breaks throughout     Knee/Hip Exercises: Seated   Long Arc Quad Both;2 sets;15 reps    Other Seated Knee/Hip Exercises Clam   2 minutes   Abduction/Adduction  Both   3 minutes   Abd/Adduction Limitations red t-band around ankles    Sit to Sand with UE support;2 sets   3 reps                         PT Long Term Goals - 06/20/21 1428       PT LONG TERM GOAL #1   Title Pt will be  independent in her HEP and progression to work through cancer related fatigue    Time 8    Period Weeks    Status New    Target Date 08/15/21      PT LONG TERM GOAL #2   Title Pt will be able to perform sit to stand x 8 in 30 seconds to demonstrate improved mobility and function    Time 8    Period Weeks    Status New    Target Date 08/15/21      PT LONG TERM GOAL #3   Title Pt will be able to return to home independently with modifications as needed safely    Time 8    Period Weeks    Status New    Target Date 08/15/21                   Plan - 07/04/21 1352     Clinical Impression Statement Patient presented to treatment reporting and exhibiting increased fatigue as evidenced by her reduced gait speed and need for increased rest breaks. This resulted in today's treatment focusing on seated interventions for improved lower extremity strength and mobility. She required minimal cuing with resisted hip abduction to prevent dragging her foot to facilitate eccentric hip control. She reported feeling tired upon the conclusion of treatment. She continues to require skilled physical therapy to address her remaining impairments to maximize her functional mobility.    Personal Factors and Comorbidities Comorbidity 3+    Comorbidities met lung cancer, brain mets, radiation hx    Examination-Activity Limitations Bathing;Lift;Stand;Locomotion Level;Bend;Stairs    Examination-Participation Restrictions Cleaning;Meal Prep;Yard Work;Community Activity;Occupation;Driving    Stability/Clinical Decision Making Evolving/Moderate complexity    Rehab Potential Good    PT Frequency 2x / week    PT Duration 8 weeks    PT Treatment/Interventions ADLs/Self Care Home Management;Therapeutic exercise;Patient/family education;Gait training;Neuromuscular re-education;Balance training;DME Instruction;Stair training    PT Next Visit Plan start general UE/LE therex - low level to start, assess balance as  needed    Consulted and Agree with Plan of Care Patient             Patient will benefit from skilled therapeutic intervention in order to improve the following deficits and impairments:  Difficulty walking, Decreased strength  Visit Diagnosis: 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)  Brain metastases (HCC)  Muscle weakness (generalized)     Problem List Patient Active Problem List   Diagnosis Date Noted   Primary adenocarcinoma of upper lobe of left lung ([place]) 05/03/2021   Lung mass 04/24/2021  Brain metastases ([place]) 04/23/2021   Chronic intractable headache 09/09/2018   Chronic migraine without aura without status migrainosus, not intractable 09/09/2018   Morbid obesity ([place]) 09/09/2018   Medication overuse headache 09/09/2018   Cholecystitis 05/25/2015    Darlin Coco, PT 07/04/2021, 2:56 PM  [facility] [address] [place], [place], [address] Phone: [phone]   Fax:  [phone]  Name: Abigail Golden MRN: [id number] Date of Birth: 11/15/1965

## 2021-07-09 ENCOUNTER — Ambulatory Visit: Payer: BC Managed Care – PPO

## 2021-07-09 ENCOUNTER — Other Ambulatory Visit (HOSPITAL_COMMUNITY): Payer: Self-pay

## 2021-07-09 ENCOUNTER — Other Ambulatory Visit: Payer: Self-pay

## 2021-07-09 DIAGNOSIS — C3412 Malignant neoplasm of upper lobe, left bronchus or lung: Secondary | ICD-10-CM | POA: Diagnosis not present

## 2021-07-09 DIAGNOSIS — C7931 Secondary malignant neoplasm of brain: Secondary | ICD-10-CM

## 2021-07-09 DIAGNOSIS — M6281 Muscle weakness (generalized): Secondary | ICD-10-CM

## 2021-07-09 NOTE — Therapy (Signed)
[facility] [place], [place], [address] Phone: [phone]   Fax:  [phone]  Physical Therapy Treatment  Patient Details  Name: Abigail Golden MRN: [id number] Date of Birth: April 06, 1966 Referring Provider (PT): Dr. Burr Medico   Encounter Date: 07/09/2021   PT End of Session - 07/09/21 1346     Visit Number 5    Number of Visits 17    Date for PT Re-Evaluation 08/15/21    PT Start Time 1347    PT Stop Time 1435    PT Time Calculation (min) 48 min    Activity Tolerance Patient tolerated treatment well;Patient limited by fatigue    Behavior During Therapy [facility] for tasks assessed/performed             Past Medical History:  Diagnosis Date   Allergy    Anxiety    Chronic bronchitis ([place])    Depression    Headache    Hypoglycemia    Lower back pain    since fall ~ 2012 (05/25/2015)   Migraine    05/25/2015 "probably once/month"   Migraine headache    Pain in left hip    since fall ~ 2012 (05/25/2015)   Pneumonia "several times"    Past Surgical History:  Procedure Laterality Date   ABDOMINAL HYSTERECTOMY  11/2002   "found benign tumors"   BRONCHIAL BIOPSY  04/24/2021   Procedure: BRONCHIAL BIOPSIES;  Surgeon: Garner Nash, DO;  Location: [facility];  Service: Pulmonary;;   BRONCHIAL BRUSHINGS  04/24/2021   Procedure: BRONCHIAL BRUSHINGS;  Surgeon: Garner Nash, DO;  Location: [place];  Service: Pulmonary;;   BRONCHIAL NEEDLE ASPIRATION BIOPSY  04/24/2021   Procedure: BRONCHIAL NEEDLE ASPIRATION BIOPSIES;  Surgeon: Garner Nash, DO;  Location: [facility];  Service: Pulmonary;;   CHEST TUBE INSERTION  04/24/2021   Procedure: CHEST TUBE INSERTION;  Surgeon: Garner Nash, DO;  Location: [facility];  Service: Pulmonary;;   CHOLECYSTECTOMY N/A 05/25/2015   Procedure: LAPAROSCOPIC CHOLECYSTECTOMY;  Surgeon: Ralene Ok, MD;  Location: [place];  Service: General;  Laterality: N/A;   LAPAROSCOPIC  CHOLECYSTECTOMY  05/25/2015   MENISCUS REPAIR Right 03/2017   VIDEO BRONCHOSCOPY WITH ENDOBRONCHIAL NAVIGATION N/A 04/24/2021   Procedure: VIDEO BRONCHOSCOPY WITH ENDOBRONCHIAL NAVIGATION;  Surgeon: Garner Nash, DO;  Location: [place];  Service: Pulmonary;  Laterality: N/A;   VIDEO BRONCHOSCOPY WITH RADIAL ENDOBRONCHIAL ULTRASOUND  04/24/2021   Procedure: VIDEO BRONCHOSCOPY WITH RADIAL ENDOBRONCHIAL ULTRASOUND;  Surgeon: Garner Nash, DO;  Location: [facility];  Service: Pulmonary;;    There were no vitals filed for this visit.   Subjective Assessment - 07/09/21 1348     Subjective Patient reports that she feels alright today. She notes that she has to take her time and move slow. She notes that she sees her physician tomorrow.    Pertinent History Stage IV NSCLC with brain mets. Radiation to the brain 3 treatments. chemo pill every day. Hx of knee debridement Rt    Limitations House hold activities;Walking;Sitting    How long can you walk comfortably? 65mn    Patient Stated Goals can I build some strength and maybe return to work    Currently in Pain? No/denies    Pain Onset More than a month ago                               [facility]  [facility] 0001       Knee/Hip Exercises: Aerobic   Nustep L1 x 14 minutes   brief rest breaks throughout     Knee/Hip Exercises: Standing   Knee Flexion Both   1 minute total   Other Standing Knee Exercises Side stepping   2 minutes     Knee/Hip Exercises: Seated   Long Arc Quad Both   2 minutes   Other Seated Knee/Hip Exercises Heel/toe raises   2 minutes   Other Seated Knee/Hip Exercises Clam   2 minutes     Shoulder Exercises: Seated   Other Seated Exercises [place] bending   up and down; 3 minutes total; red   Other Seated Exercises [place] twisting   red; 3 minutes                    PT Education - 07/09/21 1633     Education Details fatigue and follow up with her  physician    Person(s) Educated Patient    Methods Explanation    Comprehension Verbalized understanding                 PT Long Term Goals - 06/20/21 1428       PT LONG TERM GOAL #1   Title Pt will be independent in her HEP and progression to work through cancer related fatigue    Time 8    Period Weeks    Status New    Target Date 08/15/21      PT LONG TERM GOAL #2   Title Pt will be able to perform sit to stand x 8 in 30 seconds to demonstrate improved mobility and function    Time 8    Period Weeks    Status New    Target Date 08/15/21      PT LONG TERM GOAL #3   Title Pt will be able to return to home independently with modifications as needed safely    Time 8    Period Weeks    Status New    Target Date 08/15/21                   Plan - 07/09/21 1347     Clinical Impression Statement Treatment focused on improved global endurance through the use of new and familiar interventions. Fatigue was her primary limitation with today's interventions as she required multiple seated rest breaks throughout treatment. Seated and standing interventions were alternated along with upper and lower extremity interventions to avoid a significant increase in fatigue. She reported feeling alright upon the conclusion of treatment. She continues to require skilled physical therapy to address her remaining impairments to maximize her functional mobility.    Personal Factors and Comorbidities Comorbidity 3+    Comorbidities met lung cancer, brain mets, radiation hx    Examination-Activity Limitations Bathing;Lift;Stand;Locomotion Level;Bend;Stairs    Examination-Participation Restrictions Cleaning;Meal Prep;Yard Work;Community Activity;Occupation;Driving    Stability/Clinical Decision Making Evolving/Moderate complexity    Rehab Potential Good    PT Frequency 2x / week    PT Duration 8 weeks    PT Treatment/Interventions ADLs/Self Care Home Management;Therapeutic  exercise;Patient/family education;Gait training;Neuromuscular re-education;Balance training;DME Instruction;Stair training    PT Next Visit Plan start general UE/LE therex - low level to start, assess balance as needed    Consulted and Agree with Plan of Care Patient             Patient will benefit from skilled therapeutic intervention in order to improve  the following deficits and impairments:  Difficulty walking, Decreased strength  Visit Diagnosis: 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)  Brain metastases (HCC)  Muscle weakness (generalized)     Problem List Patient Active Problem List   Diagnosis Date Noted   Primary adenocarcinoma of upper lobe of left lung ([place]) 05/03/2021   Lung mass 04/24/2021   Brain metastases ([place]) 04/23/2021   Chronic intractable headache 09/09/2018   Chronic migraine without aura without status migrainosus, not intractable 09/09/2018   Morbid obesity ([place]) 09/09/2018   Medication overuse headache 09/09/2018   Cholecystitis 05/25/2015    Darlin Coco, PT 07/09/2021, 4:33 PM  [facility] [address] [place], [place], [address] Phone: [phone]   Fax:  [phone]  Name: Abigail Golden MRN: [id number] Date of Birth: 1965/08/25

## 2021-07-10 ENCOUNTER — Encounter: Payer: Self-pay | Admitting: Hematology

## 2021-07-10 ENCOUNTER — Inpatient Hospital Stay: Payer: BC Managed Care – PPO | Attending: Hematology

## 2021-07-10 ENCOUNTER — Inpatient Hospital Stay (HOSPITAL_BASED_OUTPATIENT_CLINIC_OR_DEPARTMENT_OTHER): Payer: BC Managed Care – PPO | Admitting: Hematology

## 2021-07-10 VITALS — BP 143/53 | HR 100 | Temp 98.0°F | Resp 19

## 2021-07-10 DIAGNOSIS — Z7952 Long term (current) use of systemic steroids: Secondary | ICD-10-CM | POA: Diagnosis not present

## 2021-07-10 DIAGNOSIS — C3412 Malignant neoplasm of upper lobe, left bronchus or lung: Secondary | ICD-10-CM | POA: Insufficient documentation

## 2021-07-10 DIAGNOSIS — Z7982 Long term (current) use of aspirin: Secondary | ICD-10-CM | POA: Insufficient documentation

## 2021-07-10 DIAGNOSIS — C7931 Secondary malignant neoplasm of brain: Secondary | ICD-10-CM | POA: Diagnosis not present

## 2021-07-10 DIAGNOSIS — Z923 Personal history of irradiation: Secondary | ICD-10-CM | POA: Insufficient documentation

## 2021-07-10 DIAGNOSIS — Z79899 Other long term (current) drug therapy: Secondary | ICD-10-CM | POA: Insufficient documentation

## 2021-07-10 LAB — CBC WITH DIFFERENTIAL (CANCER CENTER ONLY)
Abs Immature Granulocytes: 0.01 10*3/uL (ref 0.00–0.07)
Basophils Absolute: 0 10*3/uL (ref 0.0–0.1)
Basophils Relative: 0 %
Eosinophils Absolute: 0 10*3/uL (ref 0.0–0.5)
Eosinophils Relative: 0 %
HCT: 32.9 % — ABNORMAL LOW (ref 36.0–46.0)
Hemoglobin: 10.7 g/dL — ABNORMAL LOW (ref 12.0–15.0)
Immature Granulocytes: 0 %
Lymphocytes Relative: 36 %
Lymphs Abs: 1.3 10*3/uL (ref 0.7–4.0)
MCH: 28.7 pg (ref 26.0–34.0)
MCHC: 32.5 g/dL (ref 30.0–36.0)
MCV: 88.2 fL (ref 80.0–100.0)
Monocytes Absolute: 0.4 10*3/uL (ref 0.1–1.0)
Monocytes Relative: 10 %
Neutro Abs: 2 10*3/uL (ref 1.7–7.7)
Neutrophils Relative %: 54 %
Platelet Count: 198 10*3/uL (ref 150–400)
RBC: 3.73 MIL/uL — ABNORMAL LOW (ref 3.87–5.11)
RDW: 14.5 % (ref 11.5–15.5)
WBC Count: 3.7 10*3/uL — ABNORMAL LOW (ref 4.0–10.5)
nRBC: 0 % (ref 0.0–0.2)

## 2021-07-10 LAB — CMP (CANCER CENTER ONLY)
ALT: 11 U/L (ref 0–44)
AST: 16 U/L (ref 15–41)
Albumin: 4.2 g/dL (ref 3.5–5.0)
Alkaline Phosphatase: 55 U/L (ref 38–126)
Anion gap: 7 (ref 5–15)
BUN: 10 mg/dL (ref 6–20)
CO2: 25 mmol/L (ref 22–32)
Calcium: 9.7 mg/dL (ref 8.9–10.3)
Chloride: 108 mmol/L (ref 98–111)
Creatinine: 1.1 mg/dL — ABNORMAL HIGH (ref 0.44–1.00)
GFR, Estimated: 60 mL/min — ABNORMAL LOW (ref >60)
Glucose, Bld: 115 mg/dL — ABNORMAL HIGH (ref 70–99)
Potassium: 3.8 mmol/L (ref 3.5–5.1)
Sodium: 140 mmol/L (ref 135–145)
Total Bilirubin: 0.5 mg/dL (ref 0.3–1.2)
Total Protein: 7.7 g/dL (ref 6.5–8.1)

## 2021-07-10 MED ORDER — CLINDAMYCIN PHOSPHATE 1 % EX SOLN: Freq: Two times a day (BID) | CUTANEOUS | 0 refills | Status: DC

## 2021-07-10 MED ORDER — PREDNISONE 5 MG PO TABS: 5.0000 mg | ORAL_TABLET | Freq: Every day | ORAL | 0 refills | Status: DC

## 2021-07-10 NOTE — Progress Notes (Signed)
Abigail Golden   [phone] Fax:([phone]   Clinic Follow up Note   Patient Care Team: Aletha Halim., PA-C as PCP - General (Family Medicine) Valrie Hart, RN as Oncology Nurse Navigator (Oncology)  Date of Service:  07/10/2021  CHIEF COMPLAINT: f/u of metastatic lung cancer  CURRENT THERAPY:  Tagrisso, 80 mg daily, started 05/22/21  ASSESSMENT & PLAN:  Abigail Golden is a 56 y.o. female with   1. 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 Brain MRI showed multifocal metastatic disease. -staging CT CAP 04/23/21 showed a 4.5 cm LUL mass that abuts pleural surface, with no pathologically enlarged lymph nodes or metastatic disease. -bronchoscopy on 04/24/21 under Dr. Valeta Harms showed malignant cells consistent with adenocarcinoma, insufficient tissue for molecular studies. Guardant 360 was obtained and showed EGFR L858R mutation, which predicts good response to EGFR inhibitor. -PET scan 05/13/21 showed LUL mass extending along superior mediastinal border from hilum towards apex.  No lymph node or other distant metastasis. -she started Tagrisso on 05/22/21. She tolerates moderately well with mucositis and skin toxicities. We again reviewed management of AEs -Restaging CT in a month    2. Symptom Management: Rash, Nausea, Diarrhea -secondary to Tagrisso -she was prescribed magic mouthwash for mucositis and clindamycin gel for facial rash on 06/03/21, which helped significantly  -I refilled her clindamycin gel today for rash to her lower legs. -she also reports worsening nausea and fatigue since completing decadron. I prescribed prednisone 10 mg for a week then 5 mg daily for her skin rash and fatigue. -she reports diarrhea, managed with imodium. I advised her to continue.    3. Brain Metastases -brain MRI 04/23/21 showed 2 brain masses with moderate vasogenic edema left parietal lobe and left inferior frontal gyrus.  There were 2 other small metastases in the right frontal gyrus and right posterior temporal lobe and questionable 2 additional punctate metastases in the superior frontal gyrus. -she received three fractions of SRS under Dr. Tammi Klippel 11/16-11/21/22. -she completed her course of decadron.   4. 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 Abigail Golden is now 68 and the patient lives with her and she helps the patient due to her recent diagnosis. 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  -she is in touch with her father, Abigail Golden, but Abigail Golden describes him as being unable to fully comprehend her situation. -The patient has strong social support at church and has several people that can drive her to appointments. -Discussed with the patient that we have a Education officer, museum onsite if she ever has any concerns with transportation -The patient submitted FMLA paperwork, as she works as a Careers information officer.     Plan:  -continue Tagrisso 28m daily -I refilled phenergan and prescribed prednisone 10 mg daily for 1 week, then 5 mg daily, for her profound fatigue, anorexia and skin rash. -follow-up in 4 weeks with lab and CT several days before   No problem-specific Assessment & Plan notes found for this encounter.   SUMMARY OF ONCOLOGIC HISTORY: Oncology History Overview Note   Cancer Staging  Primary adenocarcinoma of upper lobe of left lung ([facility] Staging form: Lung, AJCC 8th Edition - Clinical stage from 04/24/2021: Stage IVA (cT2, cN2, pM1b) - Signed by FTruitt Merle MD on 05/14/2021    Primary adenocarcinoma of  upper lobe of left lung ([place])  04/22/2021 Imaging   CT HEAD WO CONTRAST   IMPRESSION: Two separate areas of vasogenic edema within the left hemisphere, 1 within the inferior frontal region in the other at the  left parietal vertex. Metastatic disease would be the most likely cause of this appearance. Other possibilities include venous infarctions and cerebritis. MRI with contrast recommended.    04/23/2021 Imaging   MR Brain W and Wo Contrast  IMPRESSION: 1. Metastatic disease to the brain. Two enhancing brain masses with moderate associated vasogenic edema: solid 16 mm left parietal lobe mass, and a larger 29 mm rim enhancing cystic or necrotic mass in the left inferior frontal gyrus. Two other small 2-3 mm metastases in the right inferior frontal gyrus, right posterior temporal lobe. And questionable two additional punctate metastases in both superior frontal gyri abutting the falx (series 18, image 46).   2. No significant intracranial mass effect. No other intracranial abnormality.   04/23/2021 Imaging   CT Chest W Contrast  IMPRESSION: 4.5 cm spiculated left upper lobe mass most compatible with primary lung cancer. This abuts the medial pleural surface. A few small adjacent pre-vascular mediastinal lymph nodes, none pathologically enlarged. Recommend cardiothoracic surgical and oncologic consultation.   No acute findings or evidence of metastatic disease in the abdomen or pelvis.   04/24/2021 Cancer Staging   Staging form: Lung, AJCC 8th Edition - Clinical stage from 04/24/2021: Stage IVA (cT2, cN2, pM1b) - Signed by Truitt Merle, MD on 05/14/2021    04/24/2021 Initial Biopsy   FINAL MICROSCOPIC DIAGNOSIS:   A. LUNG, LUL, FINE NEEDLE ASPIRATION:  - Malignant cells present, consistent with adenocarcinoma   B. LUNG, LUL, BRUSHING:  - Malignant cells consistent with adenocarcinoma, see comment    COMMENT:  B.  Immunohistochemical stains show that the tumor cells are positive for TTF-1 while they are negative for p63 and CK5/6, consistent with above interpretation. The number of tumor cells appears to be insufficient for molecular studies.   05/03/2021 Initial Diagnosis   Lung  cancer ([place])   05/08/2021 - 05/13/2021 Radiation Therapy   SRS treatment to the metastatic brain lesions under the care of Dr. Tammi Klippel       INTERVAL HISTORY:  Abigail Golden is here for a follow up of metastatic lung cancer. She was last seen by me on 06/07/21. She presents to the clinic accompanied by [facility]. She experienced vasovagal episode in the lab today. Abigail Golden notes Abigail Golden is squeamish, and they report this happens a lot. She adds she has generalized weakness. She reports nausea, worsened since finishing steroids. She denies vomiting. Abigail Golden also reports she is fatigued and sleeping a lot. She also has a rash on her lower legs. She also reports diarrhea. She notes this was previously relieved with imodium. Abigail Golden notes Abigail Golden does not drink enough water, she drinks a lot of sugary drinks.   All other systems were reviewed with the patient and are negative.  MEDICAL HISTORY:  Past Medical History:  Diagnosis Date   Allergy    Anxiety    Chronic bronchitis ([place])    Depression    Headache    Hypoglycemia    Lower back pain    since fall ~ 2012 (05/25/2015)   Migraine    05/25/2015 "probably once/month"   Migraine headache    Pain in left hip    since fall ~ 2012 (05/25/2015)   Pneumonia "several times"    SURGICAL HISTORY: Past Surgical History:  Procedure Laterality Date   ABDOMINAL HYSTERECTOMY  11/2002   "found benign tumors"   BRONCHIAL BIOPSY  04/24/2021   Procedure: BRONCHIAL BIOPSIES;  Surgeon: Garner Nash, DO;  Location: [facility];  Service: Pulmonary;;   BRONCHIAL BRUSHINGS  04/24/2021   Procedure: BRONCHIAL BRUSHINGS;  Surgeon: Garner Nash, DO;  Location: [facility];  Service: Pulmonary;;   BRONCHIAL NEEDLE ASPIRATION BIOPSY  04/24/2021   Procedure: BRONCHIAL NEEDLE ASPIRATION BIOPSIES;  Surgeon: Garner Nash, DO;  Location: [facility];  Service: Pulmonary;;   CHEST TUBE INSERTION  04/24/2021   Procedure: CHEST TUBE INSERTION;  Surgeon:  Garner Nash, DO;  Location: [facility];  Service: Pulmonary;;   CHOLECYSTECTOMY N/A 05/25/2015   Procedure: LAPAROSCOPIC CHOLECYSTECTOMY;  Surgeon: Ralene Ok, MD;  Location: [facility];  Service: General;  Laterality: N/A;   LAPAROSCOPIC CHOLECYSTECTOMY  05/25/2015   MENISCUS REPAIR Right 03/2017   VIDEO BRONCHOSCOPY WITH ENDOBRONCHIAL NAVIGATION N/A 04/24/2021   Procedure: VIDEO BRONCHOSCOPY WITH ENDOBRONCHIAL NAVIGATION;  Surgeon: Garner Nash, DO;  Location: [facility];  Service: Pulmonary;  Laterality: N/A;   VIDEO BRONCHOSCOPY WITH RADIAL ENDOBRONCHIAL ULTRASOUND  04/24/2021   Procedure: VIDEO BRONCHOSCOPY WITH RADIAL ENDOBRONCHIAL ULTRASOUND;  Surgeon: Garner Nash, DO;  Location: [facility];  Service: Pulmonary;;    I have reviewed the social history and family history with the patient and they are unchanged from previous note.  ALLERGIES:  is allergic to celebrex [celecoxib] and minocycline.  MEDICATIONS:  Current Outpatient Medications  Medication Sig Dispense Refill   clindamycin (CLEOCIN T) 1 % external solution Apply topically 2 (two) times daily. 30 mL 0   predniSONE (DELTASONE) 5 MG tablet Take 1 tablet (5 mg total) by mouth daily with breakfast. Take 2 tabs daily for 7 days then 1 tab daily 40 tablet 0   acetaminophen (TYLENOL) 500 MG tablet Take 500 mg by mouth every 6 (six) hours as needed for moderate pain or headache.     albuterol (VENTOLIN HFA) 108 (90 Base) MCG/ACT inhaler Inhale 2 puffs into the lungs every 4 (four) hours as needed.     ALPRAZolam (XANAX) 0.25 MG tablet Take 0.25 mg by mouth every 4 (four) hours as needed for anxiety. for anxiety     aspirin EC 81 MG tablet Take 81 mg by mouth daily. Swallow whole.     citalopram (CELEXA) 10 MG tablet Take 10 mg by mouth at bedtime.     clindamycin-benzoyl peroxide (BENZACLIN) gel Apply to face 2 times a day 25 g 1   dexamethasone (DECADRON) 2 MG tablet Take 2 tablets (4 mg total) by mouth as directed.  Take 2 tablets (75m) every morning and 1 tab (258m in the evening x7 days, then take 1 tab (46m64mtwice daily x10days, then 1 tab qam x7 days and then stop. 60 tablet 0   diclofenac sodium (VOLTAREN) 1 % GEL Apply topically as needed. To affected area     diphenhydrAMINE (BENADRYL) 25 MG tablet Take 25 mg by mouth See admin instructions. 25 mg at bedtime 25 mg as needed for itching     Docusate Sodium (STOOL SOFTENER LAXATIVE PO) Take 1 tablet by mouth as needed.     eletriptan (RELPAX) 20 MG tablet Take 20 mg by mouth as needed for headache (at onset of migraine.). May repeat in 2 hours if needed.     fluticasone (FLONASE) 50 MCG/ACT nasal spray Place 2 sprays into both nostrils daily as needed for allergies.  HYDROcodone-acetaminophen (NORCO/VICODIN) 5-325 MG tablet Take 1 tablet by mouth every 4 (four) hours as needed. 15 tablet 0   hydrOXYzine (ATARAX/VISTARIL) 25 MG tablet Take 25 mg by mouth See admin instructions. 25 mg at bedtime 25 mg during the day as needed for itching/anxiety     loratadine (CLARITIN) 10 MG tablet Take 10 mg by mouth daily.     LORazepam (ATIVAN) 2 MG tablet Take 1 tablet (2 mg total) by mouth at bedtime as needed for sleep (take 1 tablet 30 minutes prior to bedtime and may increase to 1.5 tablets 30 minutes prior to bed if needed). 30 tablet 0   magic mouthwash (nystatin, lidocaine, diphenhydrAMINE, alum & mag hydroxide) suspension Swish and swallow 5 mls by mouth every 6 hours as needed 250 mL 1   magic mouthwash w/lidocaine SOLN Take 5 mLs by mouth every 6 hours as needed for mouth pain. Swish and Swallow 250 mL 1   montelukast (SINGULAIR) 10 MG tablet Take 10 mg by mouth at bedtime.     nystatin (MYCOSTATIN/NYSTOP) powder Apply 1 application topically 3 (three) times daily. 30 g 1   ondansetron (ZOFRAN-ODT) 4 MG disintegrating tablet Take 4 mg by mouth every 8 (eight) hours as needed for nausea or vomiting.     osimertinib mesylate (TAGRISSO) 80 MG tablet Take 1  tablet (80 mg total) by mouth daily. 30 tablet 2   prochlorperazine (COMPAZINE) 10 MG tablet Take 1 tablet (10 mg total) by mouth every 6 (six) hours as needed for nausea or vomiting. 30 tablet 0   solifenacin (VESICARE) 10 MG tablet Take 10 mg by mouth every evening.     No current facility-administered medications for this visit.    PHYSICAL EXAMINATION: ECOG PERFORMANCE STATUS: 3 - Symptomatic, >50% confined to bed  Vitals:   07/10/21 1442 07/10/21 1447  BP: (!) 128/57 (!) 143/53  Pulse: 82 100  Resp:    Temp:    SpO2: 98% 99%   Wt Readings from Last 3 Encounters:  06/07/21 292 lb 6.4 oz (132.6 kg)  05/03/21 276 lb 6.4 oz (125.4 kg)  05/02/21 274 lb 12.8 oz (124.6 kg)     GENERAL:alert, no distress and comfortable SKIN: skin color, texture, turgor are normal, no rashes or significant lesions EYES: normal, Conjunctiva are pink and non-injected, sclera clear LUNGS: clear to auscultation and percussion with normal breathing effort HEART: regular rate & rhythm and no murmurs and no lower extremity edema ABDOMEN:abdomen soft, non-tender and normal bowel sounds NEURO: alert & oriented x 3 with fluent speech, no focal motor/sensory deficits  LABORATORY DATA:  I have reviewed the data as listed CBC Latest Ref Rng & Units 07/10/2021 06/07/2021 04/28/2021  WBC 4.0 - 10.5 K/uL 3.7(L) 4.5 5.5  Hemoglobin 12.0 - 15.0 g/dL 10.7(L) 11.0(L) 12.0  Hematocrit 36.0 - 46.0 % 32.9(L) 32.7(L) 35.6(L)  Platelets 150 - 400 K/uL 198 129(L) 210     CMP Latest Ref Rng & Units 07/10/2021 06/07/2021 04/28/2021  Glucose 70 - 99 mg/dL 115(H) 103(H) 160(H)  BUN 6 - 20 mg/dL '10 19 19  ' Creatinine 0.44 - 1.00 mg/dL 1.10(H) 0.90 0.85  Sodium 135 - 145 mmol/L 140 138 133(L)  Potassium 3.5 - 5.1 mmol/L 3.8 3.6 4.3  Chloride 98 - 111 mmol/L 108 104 103  CO2 22 - 32 mmol/L 25 24 21(L)  Calcium 8.9 - 10.3 mg/dL 9.7 9.1 8.8(L)  Total Protein 6.5 - 8.1 g/dL 7.7 6.9 6.1(L)  Total Bilirubin 0.3 - 1.2 mg/dL  0.5  0.6 0.7  Alkaline Phos 38 - 126 U/L 55 63 51  AST 15 - 41 U/L 16 15 14(L)  ALT 0 - 44 U/L '11 24 15      ' RADIOGRAPHIC STUDIES: I have personally reviewed the radiological images as listed and agreed with the findings in the report. No results found.    Orders Placed This Encounter  Procedures   CT CHEST ABDOMEN PELVIS W CONTRAST    Standing Status:   Future    Standing Expiration Date:   07/10/2022    Order Specific Question:   Is patient pregnant?    Answer:   No    Order Specific Question:   Preferred imaging location?    Answer:   [facility]    Order Specific Question:   Is Oral Contrast requested for this exam?    Answer:   Yes, Per Radiology protocol   All questions were answered. The patient knows to call the clinic with any problems, questions or concerns. No barriers to learning was detected. The total time spent in the appointment was 30 minutes.     Truitt Merle, MD 07/10/2021   I, Wilburn Mylar, am acting as scribe for Truitt Merle, MD.   I have reviewed the above documentation for accuracy and completeness, and I agree with the above.

## 2021-07-11 ENCOUNTER — Other Ambulatory Visit (HOSPITAL_COMMUNITY): Payer: Self-pay

## 2021-07-11 ENCOUNTER — Other Ambulatory Visit: Payer: Self-pay

## 2021-07-11 MED ORDER — ONDANSETRON 4 MG PO TBDP: 4.0000 mg | ORAL_TABLET | Freq: Three times a day (TID) | ORAL | 0 refills | Status: DC | PRN

## 2021-07-12 ENCOUNTER — Telehealth: Payer: Self-pay | Admitting: Hematology

## 2021-07-12 NOTE — Telephone Encounter (Signed)
Scheduled follow-up appointments per 1/18 los. Patient is aware. °

## 2021-07-15 ENCOUNTER — Ambulatory Visit: Payer: BC Managed Care – PPO | Admitting: Physical Therapy

## 2021-07-15 ENCOUNTER — Other Ambulatory Visit (HOSPITAL_COMMUNITY): Payer: Self-pay

## 2021-07-15 ENCOUNTER — Other Ambulatory Visit: Payer: Self-pay

## 2021-07-15 ENCOUNTER — Encounter: Payer: Self-pay | Admitting: Physical Therapy

## 2021-07-15 DIAGNOSIS — C3412 Malignant neoplasm of upper lobe, left bronchus or lung: Secondary | ICD-10-CM

## 2021-07-15 DIAGNOSIS — C7931 Secondary malignant neoplasm of brain: Secondary | ICD-10-CM

## 2021-07-15 DIAGNOSIS — M6281 Muscle weakness (generalized): Secondary | ICD-10-CM

## 2021-07-15 NOTE — Therapy (Signed)
[facility] [place], [place], [address] Phone: [phone]   Fax:  [phone]  Physical Therapy Treatment  Patient Details  Name: Abigail Golden MRN: [id number] Date of Birth: 1965-10-28 Referring Provider (PT): Dr. Burr Medico   Encounter Date: 07/15/2021   PT End of Session - 07/15/21 1438     Visit Number 6    Number of Visits 17    Date for PT Re-Evaluation 08/15/21    PT Start Time 1436    PT Stop Time 1517    PT Time Calculation (min) 41 min    Activity Tolerance Patient tolerated treatment well;Patient limited by fatigue    Behavior During Therapy [facility] for tasks assessed/performed             Past Medical History:  Diagnosis Date   Allergy    Anxiety    Chronic bronchitis ([place])    Depression    Headache    Hypoglycemia    Lower back pain    since fall ~ 2012 (05/25/2015)   Migraine    05/25/2015 "probably once/month"   Migraine headache    Pain in left hip    since fall ~ 2012 (05/25/2015)   Pneumonia "several times"    Past Surgical History:  Procedure Laterality Date   ABDOMINAL HYSTERECTOMY  11/2002   "found benign tumors"   BRONCHIAL BIOPSY  04/24/2021   Procedure: BRONCHIAL BIOPSIES;  Surgeon: Garner Nash, DO;  Location: [facility];  Service: Pulmonary;;   BRONCHIAL BRUSHINGS  04/24/2021   Procedure: BRONCHIAL BRUSHINGS;  Surgeon: Garner Nash, DO;  Location: [facility];  Service: Pulmonary;;   BRONCHIAL NEEDLE ASPIRATION BIOPSY  04/24/2021   Procedure: BRONCHIAL NEEDLE ASPIRATION BIOPSIES;  Surgeon: Garner Nash, DO;  Location: [facility];  Service: Pulmonary;;   CHEST TUBE INSERTION  04/24/2021   Procedure: CHEST TUBE INSERTION;  Surgeon: Garner Nash, DO;  Location: [facility] [facility];  Service: Pulmonary;;   CHOLECYSTECTOMY N/A 05/25/2015   Procedure: LAPAROSCOPIC CHOLECYSTECTOMY;  Surgeon: Ralene Ok, MD;  Location: [facility];  Service: General;  Laterality: N/A;   LAPAROSCOPIC  CHOLECYSTECTOMY  05/25/2015   MENISCUS REPAIR Right 03/2017   VIDEO BRONCHOSCOPY WITH ENDOBRONCHIAL NAVIGATION N/A 04/24/2021   Procedure: VIDEO BRONCHOSCOPY WITH ENDOBRONCHIAL NAVIGATION;  Surgeon: Garner Nash, DO;  Location: [facility];  Service: Pulmonary;  Laterality: N/A;   VIDEO BRONCHOSCOPY WITH RADIAL ENDOBRONCHIAL ULTRASOUND  04/24/2021   Procedure: VIDEO BRONCHOSCOPY WITH RADIAL ENDOBRONCHIAL ULTRASOUND;  Surgeon: Garner Nash, DO;  Location: [facility];  Service: Pulmonary;;    There were no vitals filed for this visit.   Subjective Assessment - 07/15/21 1436     Subjective Reports that meds have been changed as they added a small amount of prednisone. Patient states that she has worked some at [employer] and has felt better and slept better since starting Prednisone. Does have Zofran for under her tongue for nausea.    Pertinent History Stage IV NSCLC with brain mets. Radiation to the brain 3 treatments. chemo pill every day. Hx of knee debridement Rt    Limitations House hold activities;Walking;Sitting    How long can you walk comfortably? 46mn    Patient Stated Goals can I build some strength and maybe return to work    Currently in Pain? No/denies                [facility] - 07[facility]15[facility] 0001       Assessment  Medical Diagnosis non small cell lung cancer with mets to brain   ° Referring Provider (PT) Dr. Feng   ° Onset Date/Surgical Date 05/23/21   ° Hand Dominance Right   ° Next MD Visit 08/07/2021   ° Prior Therapy yes at [facility] for the knee   °  ° Precautions  ° Precaution Comments cancer mets, falls   °  ° Restrictions  ° Weight Bearing Restrictions No   ° °  °  ° °  ° ° ° ° ° ° ° ° ° ° ° ° ° ° ° ° [facility] Adult PT Treatment/Exercise - 07/15/21 0001   ° °  ° Knee/Hip Exercises: Aerobic  ° Nustep L2 x15 min   no rest breaks  °  ° Knee/Hip Exercises: Standing  ° Heel Raises Both;20 reps   ° Heel Raises Limitations B toe raise x15 reps   ° Hip Abduction  AROM;Both;3 sets;5 reps;Knee straight   °  ° Knee/Hip Exercises: Seated  ° Long Arc Quad Strengthening;Both;20 reps;Weights   ° Long Arc Quad Weight 2 lbs.   ° Clamshell with TheraBand Yellow   x20 reps  ° Marching Strengthening;Both;2 sets;10 reps;Limitations   ° Marching Limitations yellow theraband   ° °  °  ° °  ° ° ° ° ° ° ° ° ° ° ° ° ° ° ° PT Long Term Goals - 06/20/21 1428   ° °  ° PT LONG TERM GOAL #1  ° Title Pt will be independent in her HEP and progression to work through cancer related fatigue   ° Time 8   ° Period Weeks   ° Status New   ° Target Date 08/15/21   °  ° PT LONG TERM GOAL #2  ° Title Pt will be able to perform sit to stand x 8 in 30 seconds to demonstrate improved mobility and function   ° Time 8   ° Period Weeks   ° Status New   ° Target Date 08/15/21   °  ° PT LONG TERM GOAL #3  ° Title Pt will be able to return to home independently with modifications as needed safely   ° Time 8   ° Period Weeks   ° Status New   ° Target Date 08/15/21   ° °  °  ° °  ° ° ° ° ° ° ° ° Plan - 07/15/21 1529   ° ° Clinical Impression Statement Patient presented in clinic with reports of feeling better after med changes. Patient no longer reported feeling fearful of falls. Patient able to tolerate all therex with some seated rest breaks for fatigue. Slow and controlled reps completed today. Patient did exhibit some SOB. Patient reported feeling good as of end of PT.   ° Personal Factors and Comorbidities Comorbidity 3+   ° Comorbidities met lung cancer, brain mets, radiation hx   ° Examination-Activity Limitations Bathing;Lift;Stand;Locomotion Level;Bend;Stairs   ° Examination-Participation Restrictions Cleaning;Meal Prep;Yard Work;Community Activity;Occupation;Driving   ° Stability/Clinical Decision Making Evolving/Moderate complexity   ° Rehab Potential Good   ° PT Frequency 2x / week   ° PT Duration 8 weeks   ° PT Treatment/Interventions ADLs/Self Care Home Management;Therapeutic exercise;Patient/family  education;Gait training;Neuromuscular re-education;Balance training;DME Instruction;Stair training   ° PT Next Visit Plan start general UE/LE therex - low level to start, assess balance as needed   ° Consulted and Agree with Plan of Care Patient   ° °  °  ° °  ° ° °  Patient will benefit from skilled therapeutic intervention in order to improve the following deficits and impairments:  Difficulty walking, Decreased strength  Visit Diagnosis: 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)  Brain metastases (HCC)  Muscle weakness (generalized)     Problem List Patient Active Problem List   Diagnosis Date Noted   Primary adenocarcinoma of upper lobe of left lung ([place]) 05/03/2021   Lung mass 04/24/2021   Brain metastases ([place]) 04/23/2021   Chronic intractable headache 09/09/2018   Chronic migraine without aura without status migrainosus, not intractable 09/09/2018   Morbid obesity ([place]) 09/09/2018   Medication overuse headache 09/09/2018   Cholecystitis 05/25/2015    Standley Brooking, PTA 07/15/2021, 3:36 PM  [facility] [address] [place], [place], [address] Phone: [phone]   Fax:  [phone]  Name: EILIANA DRONE MRN: [id number] Date of Birth: 1966-02-28

## 2021-07-18 ENCOUNTER — Other Ambulatory Visit: Payer: Self-pay

## 2021-07-18 ENCOUNTER — Encounter: Payer: Self-pay | Admitting: Physical Therapy

## 2021-07-18 ENCOUNTER — Ambulatory Visit: Payer: BC Managed Care – PPO | Admitting: Physical Therapy

## 2021-07-18 DIAGNOSIS — C3412 Malignant neoplasm of upper lobe, left bronchus or lung: Secondary | ICD-10-CM | POA: Diagnosis not present

## 2021-07-18 DIAGNOSIS — M6281 Muscle weakness (generalized): Secondary | ICD-10-CM

## 2021-07-18 DIAGNOSIS — C7931 Secondary malignant neoplasm of brain: Secondary | ICD-10-CM

## 2021-07-18 NOTE — Therapy (Signed)
[facility] [place], [place], [address] Phone: [phone]   Fax:  [phone]  Physical Therapy Treatment  Patient Details  Name: Abigail Golden MRN: [id number] Date of Birth: 03-08-1966 Referring Provider (PT): Dr. Burr Medico   Encounter Date: 07/18/2021   PT End of Session - 07/18/21 1349     Visit Number 7    Number of Visits 17    Date for PT Re-Evaluation 08/15/21    PT Start Time 1347    PT Stop Time 1428    PT Time Calculation (min) 41 min    Activity Tolerance Patient tolerated treatment well;Patient limited by fatigue    Behavior During Therapy [facility] for tasks assessed/performed             Past Medical History:  Diagnosis Date   Allergy    Anxiety    Chronic bronchitis ([place])    Depression    Headache    Hypoglycemia    Lower back pain    since fall ~ 2012 (05/25/2015)   Migraine    05/25/2015 "probably once/month"   Migraine headache    Pain in left hip    since fall ~ 2012 (05/25/2015)   Pneumonia "several times"    Past Surgical History:  Procedure Laterality Date   ABDOMINAL HYSTERECTOMY  11/2002   "found benign tumors"   BRONCHIAL BIOPSY  04/24/2021   Procedure: BRONCHIAL BIOPSIES;  Surgeon: Garner Nash, DO;  Location: [place] [facility];  Service: Pulmonary;;   BRONCHIAL BRUSHINGS  04/24/2021   Procedure: BRONCHIAL BRUSHINGS;  Surgeon: Garner Nash, DO;  Location: [place] [facility];  Service: Pulmonary;;   BRONCHIAL NEEDLE ASPIRATION BIOPSY  04/24/2021   Procedure: BRONCHIAL NEEDLE ASPIRATION BIOPSIES;  Surgeon: Garner Nash, DO;  Location: [place] [facility];  Service: Pulmonary;;   CHEST TUBE INSERTION  04/24/2021   Procedure: CHEST TUBE INSERTION;  Surgeon: Garner Nash, DO;  Location: [place] [facility];  Service: Pulmonary;;   CHOLECYSTECTOMY N/A 05/25/2015   Procedure: LAPAROSCOPIC CHOLECYSTECTOMY;  Surgeon: Ralene Ok, MD;  Location: [place];  Service: General;  Laterality: N/A;   LAPAROSCOPIC  CHOLECYSTECTOMY  05/25/2015   MENISCUS REPAIR Right 03/2017   VIDEO BRONCHOSCOPY WITH ENDOBRONCHIAL NAVIGATION N/A 04/24/2021   Procedure: VIDEO BRONCHOSCOPY WITH ENDOBRONCHIAL NAVIGATION;  Surgeon: Garner Nash, DO;  Location: [place];  Service: Pulmonary;  Laterality: N/A;   VIDEO BRONCHOSCOPY WITH RADIAL ENDOBRONCHIAL ULTRASOUND  04/24/2021   Procedure: VIDEO BRONCHOSCOPY WITH RADIAL ENDOBRONCHIAL ULTRASOUND;  Surgeon: Garner Nash, DO;  Location: [place] [facility];  Service: Pulmonary;;    Vitals:   07/18/21 1348  SpO2: 99%     Subjective Assessment - 07/18/21 1348     Subjective Reports feeling SOB today but has been up and doing more activity today.    Pertinent History Stage IV NSCLC with brain mets. Radiation to the brain 3 treatments. chemo pill every day. Hx of knee debridement Rt    Limitations House hold activities;Walking;Sitting    How long can you walk comfortably? 54mn    Patient Stated Goals can I build some strength and maybe return to work    Currently in Pain? No/denies                [facility] - [facility]       Assessment   Medical Diagnosis non small cell lung cancer with mets to brain    Referring Provider (PT) Dr. FBurr Medico   Onset Date/Surgical Date 05/23/21  Hand Dominance Right    Next MD Visit 08/07/2021    Prior Therapy yes at [facility] for the knee      Precautions   Precaution Comments cancer mets, falls      Restrictions   Weight Bearing Restrictions No                           [facility] Adult PT Treatment/Exercise - 07/18/21 0001       Knee/Hip Exercises: Aerobic   Nustep L3 x15 min   rest break only due to L knee catching sensation which has been rare since cx diagnosis     Knee/Hip Exercises: Seated   Long Arc Quad Strengthening;Both;20 reps;Weights    Long Arc Quad Weight 2 lbs.    Clamshell with TheraBand Red   3x10 reps   Hamstring Curl Strengthening;Both;3 sets;10 reps    Hamstring  Limitations red theraband                          PT Long Term Goals - 06/20/21 1428       PT LONG TERM GOAL #1   Title Pt will be independent in her HEP and progression to work through cancer related fatigue    Time 8    Period Weeks    Status New    Target Date 08/15/21      PT LONG TERM GOAL #2   Title Pt will be able to perform sit to stand x 8 in 30 seconds to demonstrate improved mobility and function    Time 8    Period Weeks    Status New    Target Date 08/15/21      PT LONG TERM GOAL #3   Title Pt will be able to return to home independently with modifications as needed safely    Time 8    Period Weeks    Status New    Target Date 08/15/21                   Plan - 07/18/21 1441     Clinical Impression Statement Patient presented in clinic with reports of SOB but O2 sat WNL. Patient did require intermittant rest break on Nustep due to sensation of L knee catching which she has experienced rarely since cancer diagnosis. Patient monitored throughout session for symptoms and fatigue. Patient able to tolerate therex fairly well but slow and controlled reps completed for all exercises.    Personal Factors and Comorbidities Comorbidity 3+    Comorbidities met lung cancer, brain mets, radiation hx    Examination-Activity Limitations Bathing;Lift;Stand;Locomotion Level;Bend;Stairs    Examination-Participation Restrictions Cleaning;Meal Prep;Yard Work;Community Activity;Occupation;Driving    Stability/Clinical Decision Making Evolving/Moderate complexity    Rehab Potential Good    PT Frequency 2x / week    PT Duration 8 weeks    PT Treatment/Interventions ADLs/Self Care Home Management;Therapeutic exercise;Patient/family education;Gait training;Neuromuscular re-education;Balance training;DME Instruction;Stair training    PT Next Visit Plan start general UE/LE therex - low level to start, assess balance as needed    Consulted and Agree with Plan of  Care Patient             Patient will benefit from skilled therapeutic intervention in order to improve the following deficits and impairments:  Difficulty walking, Decreased strength  Visit Diagnosis: 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 [place], [place], [address] Phone: [phone]   Fax:  [phone]  Name: Abigail Golden MRN: [id number] Date of Birth: 1965-09-20

## 2021-07-22 ENCOUNTER — Other Ambulatory Visit: Payer: Self-pay

## 2021-07-22 ENCOUNTER — Ambulatory Visit: Payer: BC Managed Care – PPO

## 2021-07-22 DIAGNOSIS — C3412 Malignant neoplasm of upper lobe, left bronchus or lung: Secondary | ICD-10-CM | POA: Diagnosis not present

## 2021-07-22 DIAGNOSIS — M6281 Muscle weakness (generalized): Secondary | ICD-10-CM

## 2021-07-22 DIAGNOSIS — C7931 Secondary malignant neoplasm of brain: Secondary | ICD-10-CM

## 2021-07-22 NOTE — Therapy (Signed)
[name], [place], [address] Phone: [phone]   Fax:  [phone]  Physical Therapy Treatment  Patient Details  Name: Abigail Golden MRN: [id number] Date of Birth: 1965-09-01 Referring Provider (PT): Dr. Burr Medico   Encounter Date: 07/22/2021   PT End of Session - 07/22/21 1350     Visit Number 8    Number of Visits 17    Date for PT Re-Evaluation 08/15/21    PT Start Time 3668    PT Stop Time 1430    PT Time Calculation (min) 41 min    Activity Tolerance Patient tolerated treatment well;Patient limited by fatigue    Behavior During Therapy [facility] for tasks assessed/performed             Past Medical History:  Diagnosis Date   Allergy    Anxiety    Chronic bronchitis ([place])    Depression    Headache    Hypoglycemia    Lower back pain    since fall ~ 2012 (05/25/2015)   Migraine    05/25/2015 "probably once/month"   Migraine headache    Pain in left hip    since fall ~ 2012 (05/25/2015)   Pneumonia "several times"    Past Surgical History:  Procedure Laterality Date   ABDOMINAL HYSTERECTOMY  11/2002   "found benign tumors"   BRONCHIAL BIOPSY  04/24/2021   Procedure: BRONCHIAL BIOPSIES;  Surgeon: Garner Nash, DO;  Location: [place] [facility];  Service: Pulmonary;;   BRONCHIAL BRUSHINGS  04/24/2021   Procedure: BRONCHIAL BRUSHINGS;  Surgeon: Garner Nash, DO;  Location: [place] [facility];  Service: Pulmonary;;   BRONCHIAL NEEDLE ASPIRATION BIOPSY  04/24/2021   Procedure: BRONCHIAL NEEDLE ASPIRATION BIOPSIES;  Surgeon: Garner Nash, DO;  Location: [place] [facility];  Service: Pulmonary;;   CHEST TUBE INSERTION  04/24/2021   Procedure: CHEST TUBE INSERTION;  Surgeon: Garner Nash, DO;  Location: [place] [facility];  Service: Pulmonary;;   CHOLECYSTECTOMY N/A 05/25/2015   Procedure: LAPAROSCOPIC CHOLECYSTECTOMY;  Surgeon: Ralene Ok, MD;  Location: [place];  Service: General;  Laterality: N/A;   LAPAROSCOPIC  CHOLECYSTECTOMY  05/25/2015   MENISCUS REPAIR Right 03/2017   VIDEO BRONCHOSCOPY WITH ENDOBRONCHIAL NAVIGATION N/A 04/24/2021   Procedure: VIDEO BRONCHOSCOPY WITH ENDOBRONCHIAL NAVIGATION;  Surgeon: Garner Nash, DO;  Location: [place];  Service: Pulmonary;  Laterality: N/A;   VIDEO BRONCHOSCOPY WITH RADIAL ENDOBRONCHIAL ULTRASOUND  04/24/2021   Procedure: VIDEO BRONCHOSCOPY WITH RADIAL ENDOBRONCHIAL ULTRASOUND;  Surgeon: Garner Nash, DO;  Location: [place] [facility];  Service: Pulmonary;;    There were no vitals filed for this visit.   Subjective Assessment - 07/22/21 1348     Subjective Patient reports that she is out of breath right now. She notes that she has been doing more at home, but her left knee has started bothering her more.    Pertinent History Stage IV NSCLC with brain mets. Radiation to the brain 3 treatments. chemo pill every day. Hx of knee debridement Rt    Limitations House hold activities;Walking;Sitting    How long can you walk comfortably? 61mn    Patient Stated Goals can I build some strength and maybe return to work    Currently in Pain? No/denies                               [facility] - 07/22[facility]21 0001  Knee/Hip Exercises: Aerobic   Nustep L3 x 15 minutes      Knee/Hip Exercises: Standing   Knee Flexion Both;Strengthening   3 minutes; 2 pound ankle weights     Knee/Hip Exercises: Seated   Long Arc Quad Both;Weights   with 3 second hold; 3 minutes   Long Arc Quad Weight 2 lbs.                 Balance Exercises - 07/22/21 0001       Balance Exercises: Standing   Standing Eyes Opened Foam/compliant surface;4 reps;30 secs;Other (comment)   Intermittent UE support   Sidestepping Upper extremity support   3 minutes   Marching Foam/compliant surface;Upper extremity assist 2;Static   2 minutes                    PT Long Term Goals - 06/20/21 1428       PT LONG TERM GOAL #1    Title Pt will be independent in her HEP and progression to work through cancer related fatigue    Time 8    Period Weeks    Status New    Target Date 08/15/21      PT LONG TERM GOAL #2   Title Pt will be able to perform sit to stand x 8 in 30 seconds to demonstrate improved mobility and function    Time 8    Period Weeks    Status New    Target Date 08/15/21      PT LONG TERM GOAL #3   Title Pt will be able to return to home independently with modifications as needed safely    Time 8    Period Weeks    Status New    Target Date 08/15/21                   Plan - 07/22/21 1353     Clinical Impression Statement Patient was introduced to multiple new interventions for improved lower extremity stability needed for improved safety with her household activities. She required minimal cuing with standing knee flexion for a slower pace to facilitate greater time in single leg stance. Fatigue was her primary limitation with today's interventions as she required multiple seated rest breaks throughout treatment. She reported feeling good upon the conclusion of treatment. She continues to require skilled physical therapy to address her remaining impairments to maximize her functional mobility.    Personal Factors and Comorbidities Comorbidity 3+    Comorbidities met lung cancer, brain mets, radiation hx    Examination-Activity Limitations Bathing;Lift;Stand;Locomotion Level;Bend;Stairs    Examination-Participation Restrictions Cleaning;Meal Prep;Yard Work;Community Activity;Occupation;Driving    Stability/Clinical Decision Making Evolving/Moderate complexity    Rehab Potential Good    PT Frequency 2x / week    PT Duration 8 weeks    PT Treatment/Interventions ADLs/Self Care Home Management;Therapeutic exercise;Patient/family education;Gait training;Neuromuscular re-education;Balance training;DME Instruction;Stair training    PT Next Visit Plan start general UE/LE therex - low level to  start, assess balance as needed    Consulted and Agree with Plan of Care Patient             Patient will benefit from skilled therapeutic intervention in order to improve the following deficits and impairments:  Difficulty walking, Decreased strength  Visit Diagnosis: 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)  Brain metastases (HCC)  Muscle weakness (generalized)     Problem List Patient Active Problem List   Diagnosis Date Noted  Primary adenocarcinoma of upper lobe of left lung ([place]) 05/03/2021   Lung mass 04/24/2021   Brain metastases ([place]) 04/23/2021   Chronic intractable headache 09/09/2018   Chronic migraine without aura without status migrainosus, not intractable 09/09/2018   Morbid obesity ([place]) 09/09/2018   Medication overuse headache 09/09/2018   Cholecystitis 05/25/2015    Darlin Coco, PT 07/22/2021, 2:42 PM  [facility] [place], [place], [address] Phone: [phone]   Fax:  [phone]  Name: YISELLE BABICH MRN: [id number] Date of Birth: [birth year]

## 2021-07-24 ENCOUNTER — Other Ambulatory Visit: Payer: Self-pay

## 2021-07-25 ENCOUNTER — Encounter: Payer: Self-pay | Admitting: Physical Therapy

## 2021-07-25 ENCOUNTER — Other Ambulatory Visit: Payer: Self-pay

## 2021-07-25 ENCOUNTER — Ambulatory Visit: Payer: BC Managed Care – PPO | Attending: Hematology | Admitting: Physical Therapy

## 2021-07-25 DIAGNOSIS — C7931 Secondary malignant neoplasm of brain: Secondary | ICD-10-CM | POA: Diagnosis present

## 2021-07-25 DIAGNOSIS — M6281 Muscle weakness (generalized): Secondary | ICD-10-CM | POA: Diagnosis present

## 2021-07-25 DIAGNOSIS — C3412 Malignant neoplasm of upper lobe, left bronchus or lung: Secondary | ICD-10-CM | POA: Diagnosis not present

## 2021-07-25 NOTE — Therapy (Signed)
[facility] [place], [place], [address] Phone: [phone]   Fax:  [phone]  Physical Therapy Treatment  Patient Details  Name: Abigail Golden MRN: [id number] Date of Birth: [birth year] Referring Provider (PT): Dr. Burr Medico   Encounter Date: 07/25/2021   PT End of Session - 07/25/21 1313     Visit Number 9    Number of Visits 17    Date for PT Re-Evaluation 08/15/21    PT Start Time 1300    PT Stop Time 1346    PT Time Calculation (min) 46 min    Activity Tolerance Patient tolerated treatment well;Patient limited by fatigue    Behavior During Therapy [facility] for tasks assessed/performed             Past Medical History:  Diagnosis Date   Allergy    Anxiety    Chronic bronchitis ([place])    Depression    Headache    Hypoglycemia    Lower back pain    since fall ~ 2012 (05/25/2015)   Migraine    05/25/2015 "probably once/month"   Migraine headache    Pain in left hip    since fall ~ 2012 (05/25/2015)   Pneumonia "several times"    Past Surgical History:  Procedure Laterality Date   ABDOMINAL HYSTERECTOMY  11/2002   "found benign tumors"   BRONCHIAL BIOPSY  04/24/2021   Procedure: BRONCHIAL BIOPSIES;  Surgeon: Garner Nash, DO;  Location: [place] [facility];  Service: Pulmonary;;   BRONCHIAL BRUSHINGS  04/24/2021   Procedure: BRONCHIAL BRUSHINGS;  Surgeon: Garner Nash, DO;  Location: [place] [facility];  Service: Pulmonary;;   BRONCHIAL NEEDLE ASPIRATION BIOPSY  04/24/2021   Procedure: BRONCHIAL NEEDLE ASPIRATION BIOPSIES;  Surgeon: Garner Nash, DO;  Location: [place] [facility];  Service: Pulmonary;;   CHEST TUBE INSERTION  04/24/2021   Procedure: CHEST TUBE INSERTION;  Surgeon: Garner Nash, DO;  Location: [place] [facility];  Service: Pulmonary;;   CHOLECYSTECTOMY N/A 05/25/2015   Procedure: LAPAROSCOPIC CHOLECYSTECTOMY;  Surgeon: Ralene Ok, MD;  Location: [place];  Service: General;  Laterality: N/A;   LAPAROSCOPIC  CHOLECYSTECTOMY  05/25/2015   MENISCUS REPAIR Right 03/2017   VIDEO BRONCHOSCOPY WITH ENDOBRONCHIAL NAVIGATION N/A 04/24/2021   Procedure: VIDEO BRONCHOSCOPY WITH ENDOBRONCHIAL NAVIGATION;  Surgeon: Garner Nash, DO;  Location: [place];  Service: Pulmonary;  Laterality: N/A;   VIDEO BRONCHOSCOPY WITH RADIAL ENDOBRONCHIAL ULTRASOUND  04/24/2021   Procedure: VIDEO BRONCHOSCOPY WITH RADIAL ENDOBRONCHIAL ULTRASOUND;  Surgeon: Garner Nash, DO;  Location: [place] [facility];  Service: Pulmonary;;    There were no vitals filed for this visit.   Subjective Assessment - 07/25/21 1305     Subjective Her left knee has started bothering her more.    Pertinent History Stage IV NSCLC with brain mets. Radiation to the brain 3 treatments. chemo pill every day. Hx of knee debridement Rt    Limitations House hold activities;Walking;Sitting    How long can you walk comfortably? 69mn    Patient Stated Goals can I build some strength and maybe return to work    Currently in Pain? No/denies                [facility] - 07[facility]25[facility] 0001       Assessment   Medical Diagnosis non small cell lung cancer with mets to brain    Referring Provider (PT) Dr. FBurr Medico   Onset Date/Surgical Date 05/23/21    Hand Dominance Right  Next MD Visit 08/07/2021    Prior Therapy yes at [facility] knee      Precautions   Precaution Comments cancer mets, falls      Restrictions   Weight Bearing Restrictions No                           [facility] Adult PT Treatment/Exercise - 07/25/21 0001       Knee/Hip Exercises: Aerobic   Nustep L3 x 15 minutes      Knee/Hip Exercises: Standing   Heel Raises Both;20 reps    Heel Raises Limitations B toe raise x20 reps    Knee Flexion AROM;Both;20 reps    Hip Flexion AROM;Both;20 reps;Knee bent    Hip Abduction AROM;Both;20 reps;Knee straight      Knee/Hip Exercises: Seated   Long Arc Quad Strengthening;Both;15 reps;Weights    Long Arc Quad  Weight 4 lbs.    Hamstring Curl Strengthening;Both;15 reps;Limitations    Hamstring Limitations red theraband                          PT Long Term Goals - 06/20/21 1428       PT LONG TERM GOAL #1   Title Pt will be independent in her HEP and progression to work through cancer related fatigue    Time 8    Period Weeks    Status New    Target Date 08/15/21      PT LONG TERM GOAL #2   Title Pt will be able to perform sit to stand x 8 in 30 seconds to demonstrate improved mobility and function    Time 8    Period Weeks    Status New    Target Date 08/15/21      PT LONG TERM GOAL #3   Title Pt will be able to return to home independently with modifications as needed safely    Time 8    Period Weeks    Status New    Target Date 08/15/21                   Plan - 07/25/21 1424     Clinical Impression Statement Patient presented in clinic with reports of feeling good but L knee discomfort intermittantly. Patient able to tolerate more standing activities today but continues to requires intermittant rest breaks due to fatigue. Patient felt "pretty good" at end of treatment.    Personal Factors and Comorbidities Comorbidity 3+    Comorbidities met lung cancer, brain mets, radiation hx    Examination-Activity Limitations Bathing;Lift;Stand;Locomotion Level;Bend;Stairs    Examination-Participation Restrictions Cleaning;Meal Prep;Yard Work;Community Activity;Occupation;Driving    Stability/Clinical Decision Making Evolving/Moderate complexity    Rehab Potential Good    PT Frequency 2x / week    PT Duration 8 weeks    PT Treatment/Interventions ADLs/Self Care Home Management;Therapeutic exercise;Patient/family education;Gait training;Neuromuscular re-education;Balance training;DME Instruction;Stair training    PT Next Visit Plan start general UE/LE therex - low level to start, assess balance as needed    Consulted and Agree with Plan of Care Patient              Patient will benefit from skilled therapeutic intervention in order to improve the following deficits and impairments:  Difficulty walking, Decreased strength  Visit Diagnosis: 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)  Brain metastases (HCC)  Muscle weakness (generalized)     Problem  List Patient Active Problem List   Diagnosis Date Noted   Primary adenocarcinoma of upper lobe of left lung ([place]) 05/03/2021   Lung mass 04/24/2021   Brain metastases ([place]) 04/23/2021   Chronic intractable headache 09/09/2018   Chronic migraine without aura without status migrainosus, not intractable 09/09/2018   Morbid obesity ([place]) 09/09/2018   Medication overuse headache 09/09/2018   Cholecystitis 05/25/2015    Standley Brooking, PTA 07/25/2021, 2:28 PM  [facility] [address] [place], [place], [address] Phone: [phone]   Fax:  [phone]  Name: Abigail Golden MRN: [id number] Date of Birth: 03/12/1966

## 2021-07-29 ENCOUNTER — Other Ambulatory Visit: Payer: Self-pay

## 2021-07-29 ENCOUNTER — Encounter: Payer: Self-pay | Admitting: Physical Therapy

## 2021-07-29 ENCOUNTER — Ambulatory Visit: Payer: BC Managed Care – PPO | Admitting: Physical Therapy

## 2021-07-29 DIAGNOSIS — M6281 Muscle weakness (generalized): Secondary | ICD-10-CM

## 2021-07-29 DIAGNOSIS — C7931 Secondary malignant neoplasm of brain: Secondary | ICD-10-CM

## 2021-07-29 DIAGNOSIS — C3412 Malignant neoplasm of upper lobe, left bronchus or lung: Secondary | ICD-10-CM | POA: Diagnosis not present

## 2021-07-29 NOTE — Therapy (Signed)
[facility] [place], [place], [address] Phone: [phone]   Fax:  [phone]  Physical Therapy Treatment  Patient Details  Name: Abigail Golden MRN: [id number] Date of Birth: Oct 05, 1965 Referring Provider (PT): Dr. Burr Medico   Encounter Date: 07/29/2021   PT End of Session - 07/29/21 1312     Visit Number 10    Number of Visits 17    Date for PT Re-Evaluation 08/15/21    PT Start Time 0569    PT Stop Time 7948    PT Time Calculation (min) 44 min    Activity Tolerance Patient tolerated treatment well;Patient limited by fatigue    Behavior During Therapy [facility] for tasks assessed/performed             Past Medical History:  Diagnosis Date   Allergy    Anxiety    Chronic bronchitis ([place])    Depression    Headache    Hypoglycemia    Lower back pain    since fall ~ 2012 (05/25/2015)   Migraine    05/25/2015 "probably once/month"   Migraine headache    Pain in left hip    since fall ~ 2012 (05/25/2015)   Pneumonia "several times"    Past Surgical History:  Procedure Laterality Date   ABDOMINAL HYSTERECTOMY  11/2002   "found benign tumors"   BRONCHIAL BIOPSY  04/24/2021   Procedure: BRONCHIAL BIOPSIES;  Surgeon: Garner Nash, DO;  Location: [place] [facility];  Service: Pulmonary;;   BRONCHIAL BRUSHINGS  04/24/2021   Procedure: BRONCHIAL BRUSHINGS;  Surgeon: Garner Nash, DO;  Location: [place] [facility];  Service: Pulmonary;;   BRONCHIAL NEEDLE ASPIRATION BIOPSY  04/24/2021   Procedure: BRONCHIAL NEEDLE ASPIRATION BIOPSIES;  Surgeon: Garner Nash, DO;  Location: [place] [facility];  Service: Pulmonary;;   CHEST TUBE INSERTION  04/24/2021   Procedure: CHEST TUBE INSERTION;  Surgeon: Garner Nash, DO;  Location: [place] [facility];  Service: Pulmonary;;   CHOLECYSTECTOMY N/A 05/25/2015   Procedure: LAPAROSCOPIC CHOLECYSTECTOMY;  Surgeon: Ralene Ok, MD;  Location: [place];  Service: General;  Laterality: N/A;   LAPAROSCOPIC  CHOLECYSTECTOMY  05/25/2015   MENISCUS REPAIR Right 03/2017   VIDEO BRONCHOSCOPY WITH ENDOBRONCHIAL NAVIGATION N/A 04/24/2021   Procedure: VIDEO BRONCHOSCOPY WITH ENDOBRONCHIAL NAVIGATION;  Surgeon: Garner Nash, DO;  Location: [place];  Service: Pulmonary;  Laterality: N/A;   VIDEO BRONCHOSCOPY WITH RADIAL ENDOBRONCHIAL ULTRASOUND  04/24/2021   Procedure: VIDEO BRONCHOSCOPY WITH RADIAL ENDOBRONCHIAL ULTRASOUND;  Surgeon: Garner Nash, DO;  Location: [place] [facility];  Service: Pulmonary;;    There were no vitals filed for this visit.   Subjective Assessment - 07/29/21 1308     Subjective Reports that since last week she has had sharp pains in the posterior L shoulder. Has tried guarding it.    Pertinent History Stage IV NSCLC with brain mets. Radiation to the brain 3 treatments. chemo pill every day. Hx of knee debridement Rt    Limitations House hold activities;Walking;Sitting    How long can you walk comfortably? 63mn    Patient Stated Goals can I build some strength and maybe return to work    Currently in Pain? Yes    Pain Score 6     Pain Location Shoulder    Pain Orientation Left;Posterior    Pain Descriptors / Indicators Sharp    Pain Type Acute pain    Pain Onset In the past 7 days    Pain Frequency Intermittent  [facility] PT Assessment - 07/29/21 0001       Assessment   Medical Diagnosis non small cell lung cancer with mets to brain    Referring Provider (PT) Dr. Burr Medico    Onset Date/Surgical Date 05/23/21    Hand Dominance Right    Next MD Visit 08/07/2021    Prior Therapy yes at [facility] for the knee      Precautions   Precaution Comments cancer mets, falls                           [facility] Adult PT Treatment/Exercise - 07/29/21 0001       Knee/Hip Exercises: Aerobic   Nustep L3 x 15 minutes   LEs only     Knee/Hip Exercises: Standing   Heel Raises Both;10 reps    Hip Abduction AROM;Both;20 reps;Knee straight       Knee/Hip Exercises: Seated   Long Arc Quad Strengthening;Both;15 reps;Weights    Long Arc Quad Weight 3 lbs.    Clamshell with TheraBand Green   x20 reps   Hamstring Curl Strengthening;Both;15 reps;Limitations    Hamstring Limitations red theraband    Sit to Sand 10 reps;with UE support                          PT Long Term Goals - 07/29/21 1343       PT LONG TERM GOAL #1   Title Pt will be independent in her HEP and progression to work through cancer related fatigue    Time 8    Period Weeks    Status Achieved    Target Date 08/15/21      PT LONG TERM GOAL #2   Title Pt will be able to perform sit to stand x 8 in 30 seconds to demonstrate improved mobility and function    Time 8    Period Weeks    Status On-going    Target Date 08/15/21      PT LONG TERM GOAL #3   Title Pt will be able to return to home independently with modifications as needed safely    Time 8    Period Weeks    Status On-going    Target Date 08/15/21                   Plan - 07/29/21 1405     Clinical Impression Statement Patient presented in clinic with reports of rash and scratching along her heel. Patient progressed through resisted exercises with intermittant rest breaks. L shoulder assessed by Jacqulynn Cadet, DPT. Patient observed with SOB during therex although not reported by patient.    Personal Factors and Comorbidities Comorbidity 3+    Comorbidities met lung cancer, brain mets, radiation hx    Examination-Activity Limitations Bathing;Lift;Stand;Locomotion Level;Bend;Stairs    Examination-Participation Restrictions Cleaning;Meal Prep;Yard Work;Community Activity;Occupation;Driving    Stability/Clinical Decision Making Evolving/Moderate complexity    Rehab Potential Good    PT Frequency 2x / week    PT Duration 8 weeks    PT Treatment/Interventions ADLs/Self Care Home Management;Therapeutic exercise;Patient/family education;Gait training;Neuromuscular  re-education;Balance training;DME Instruction;Stair training    PT Next Visit Plan start general UE/LE therex - low level to start, assess balance as needed    Consulted and Agree with Plan of Care Patient             Patient will benefit from skilled therapeutic intervention in order to improve  the following deficits and impairments:  Difficulty walking, Decreased strength  Visit Diagnosis: 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)  Brain metastases (HCC)  Muscle weakness (generalized)     Problem List Patient Active Problem List   Diagnosis Date Noted   Primary adenocarcinoma of upper lobe of left lung ([place]) 05/03/2021   Lung mass 04/24/2021   Brain metastases ([place]) 04/23/2021   Chronic intractable headache 09/09/2018   Chronic migraine without aura without status migrainosus, not intractable 09/09/2018   Morbid obesity ([place]) 09/09/2018   Medication overuse headache 09/09/2018   Cholecystitis 05/25/2015    Standley Brooking, PTA 07/29/2021, 3:48 PM  [facility] [address] [place], [place], [address] Phone: [phone]   Fax:  [phone]  Name: Abigail Golden MRN: [id number] Date of Birth: 1965-11-11

## 2021-07-30 ENCOUNTER — Telehealth: Payer: Self-pay | Admitting: *Deleted

## 2021-07-30 NOTE — Telephone Encounter (Signed)
Receptionist reports walk-in request of Abigail Golden to see this nurse in lobby.    Connected with [name] with a [employer] for Use / Disclosure of PHI (ROI) as request previously e-mailed to [email or web address] Friday,07/19/2021 at 1618 has not yet been received for form process.  Today [name] delivered and signed [facility] cover sheets and ROI addressed to [employer].   "I spoke with [place] representative [employer] to inform her I am bringing the forms and records to her today after I leave here.  [place] [employer], [employer]A forms and records are no longer to be faxed but hand delivered to [employer].  Due by February 10th is why I am here now; unable to get there on the 10th, I do not drive and need to deliver banking information with 703 and 7A to school today.  The [employer] form may be faxed."  Apologized for delay or any inconvenience as three forms are not yet completed.  Records are not processed by [facility] H.I.M.  Our H.I.M. staff forwards requests for medical records to (SW) Health Information Management Department offices for processing.   Asked for clarification of [employer] service change and obtained new release for [employer]. Schools to avoid further delays.  [employer] previously advised this nurse records are not to be sent to [facility] [employer] offices in [place], [place]. "And that's why I am here now to pick up my records.  This will cause a month delay.  I can't wait to complete another survey.  I should date everything the date I delivered the forms not today.  The [place] form is to be completed every month."   Advised this nurse will reach out to [employer] at this time.  Both patient and daughter state "She is hard to reach" waiting in lobby.   This nurse completed forms intending to avoid further delays and return today.  Continue awaiting return call from [employer].  Patient has left confirmed per  receptionist. Collaborative notified.  Forms awaiting provider review.

## 2021-08-01 ENCOUNTER — Ambulatory Visit: Payer: BC Managed Care – PPO

## 2021-08-01 ENCOUNTER — Other Ambulatory Visit: Payer: Self-pay

## 2021-08-01 DIAGNOSIS — M6281 Muscle weakness (generalized): Secondary | ICD-10-CM

## 2021-08-01 DIAGNOSIS — C7931 Secondary malignant neoplasm of brain: Secondary | ICD-10-CM

## 2021-08-01 DIAGNOSIS — C3412 Malignant neoplasm of upper lobe, left bronchus or lung: Secondary | ICD-10-CM | POA: Diagnosis not present

## 2021-08-01 NOTE — Therapy (Signed)
[facility] [place], [place], [address] Phone: [phone]   Fax:  [phone]  Physical Therapy Treatment  Patient Details  Name: Abigail Golden MRN: [id number] Date of Birth: 13-Sep-1965 Referring Provider (PT): Dr. Burr Medico   Encounter Date: 08/01/2021   PT End of Session - 08/01/21 1306     Visit Number 11    Number of Visits 17    Date for PT Re-Evaluation 08/15/21    PT Start Time 1301    PT Stop Time 1344    PT Time Calculation (min) 43 min    Activity Tolerance Patient tolerated treatment well;Patient limited by fatigue    Behavior During Therapy [facility] for tasks assessed/performed             Past Medical History:  Diagnosis Date   Allergy    Anxiety    Chronic bronchitis ([place])    Depression    Headache    Hypoglycemia    Lower back pain    since fall ~ 2012 (05/25/2015)   Migraine    05/25/2015 "probably once/month"   Migraine headache    Pain in left hip    since fall ~ 2012 (05/25/2015)   Pneumonia "several times"    Past Surgical History:  Procedure Laterality Date   ABDOMINAL HYSTERECTOMY  11/2002   "found benign tumors"   BRONCHIAL BIOPSY  04/24/2021   Procedure: BRONCHIAL BIOPSIES;  Surgeon: Garner Nash, DO;  Location: [place] [facility];  Service: Pulmonary;;   BRONCHIAL BRUSHINGS  04/24/2021   Procedure: BRONCHIAL BRUSHINGS;  Surgeon: Garner Nash, DO;  Location: [place] [facility];  Service: Pulmonary;;   BRONCHIAL NEEDLE ASPIRATION BIOPSY  04/24/2021   Procedure: BRONCHIAL NEEDLE ASPIRATION BIOPSIES;  Surgeon: Garner Nash, DO;  Location: [place] [facility];  Service: Pulmonary;;   CHEST TUBE INSERTION  04/24/2021   Procedure: CHEST TUBE INSERTION;  Surgeon: Garner Nash, DO;  Location: [place] [facility];  Service: Pulmonary;;   CHOLECYSTECTOMY N/A 05/25/2015   Procedure: LAPAROSCOPIC CHOLECYSTECTOMY;  Surgeon: Ralene Ok, MD;  Location: [place];  Service: General;  Laterality: N/A;   LAPAROSCOPIC  CHOLECYSTECTOMY  05/25/2015   MENISCUS REPAIR Right 03/2017   VIDEO BRONCHOSCOPY WITH ENDOBRONCHIAL NAVIGATION N/A 04/24/2021   Procedure: VIDEO BRONCHOSCOPY WITH ENDOBRONCHIAL NAVIGATION;  Surgeon: Garner Nash, DO;  Location: [place];  Service: Pulmonary;  Laterality: N/A;   VIDEO BRONCHOSCOPY WITH RADIAL ENDOBRONCHIAL ULTRASOUND  04/24/2021   Procedure: VIDEO BRONCHOSCOPY WITH RADIAL ENDOBRONCHIAL ULTRASOUND;  Surgeon: Garner Nash, DO;  Location: [place] [facility];  Service: Pulmonary;;    There were no vitals filed for this visit.   Subjective Assessment - 08/01/21 1303     Subjective Patient reports that her left shoulder is still hurting today. She notes that it hurt so bad that she was gonna cry yesterday. However, she feels that she is able to move more and better since beginning therapy.    Pertinent History Stage IV NSCLC with brain mets. Radiation to the brain 3 treatments. chemo pill every day. Hx of knee debridement Rt    Limitations House hold activities;Walking;Sitting    How long can you walk comfortably? 69mn    Patient Stated Goals can I build some strength and maybe return to work    Currently in Pain? Yes    Pain Score 6     Pain Location Shoulder    Pain Orientation Left    Pain Onset In the past 7 days  [facility] Adult PT Treatment/Exercise - 08/01/21 0001       Knee/Hip Exercises: Aerobic   Nustep L3 x 16 minutes      Knee/Hip Exercises: Standing   Hip Extension Both;Knee straight   2 minutes each   Extension Limitations Red t-band    Rocker Board 3 minutes      Knee/Hip Exercises: Seated   Long Arc Quad Both;Strengthening;Weights   2 minutes; 10 second hold   Long Arc Quad Weight 4 lbs.    Sit to Sand 15 reps;without UE support                 Balance Exercises - 08/01/21 0001       Balance Exercises: Standing   Marching Foam/compliant surface;Upper extremity assist 2;Static   2 minutes                     PT Long Term Goals - 07/29/21 1343       PT LONG TERM GOAL #1   Title Pt will be independent in her HEP and progression to work through cancer related fatigue    Time 8    Period Weeks    Status Achieved    Target Date 08/15/21      PT LONG TERM GOAL #2   Title Pt will be able to perform sit to stand x 8 in 30 seconds to demonstrate improved mobility and function    Time 8    Period Weeks    Status On-going    Target Date 08/15/21      PT LONG TERM GOAL #3   Title Pt will be able to return to home independently with modifications as needed safely    Time 8    Period Weeks    Status On-going    Target Date 08/15/21                   Plan - 08/01/21 1309     Clinical Impression Statement Patient was progressed with resisted hip extension in addition to familiar interventions with moderate difficulty and fatigue. She required minimal cuing with the rocker board today for slow and controlled mobility to facilitate gastroc engagement. Fatigue was her primary limitation with today's interventions as she required a seated rest break after today's standing interventions. She reported feeling a little tired upon the conclusion of treatment. She continues to require skilled physical therapy to address her remaining impairments to improve her functional mobility.    Personal Factors and Comorbidities Comorbidity 3+    Comorbidities met lung cancer, brain mets, radiation hx    Examination-Activity Limitations Bathing;Lift;Stand;Locomotion Level;Bend;Stairs    Examination-Participation Restrictions Cleaning;Meal Prep;Yard Work;Community Activity;Occupation;Driving    Stability/Clinical Decision Making Evolving/Moderate complexity    Rehab Potential Good    PT Frequency 2x / week    PT Duration 8 weeks    PT Treatment/Interventions ADLs/Self Care Home Management;Therapeutic exercise;Patient/family education;Gait training;Neuromuscular  re-education;Balance training;DME Instruction;Stair training    PT Next Visit Plan start general UE/LE therex - low level to start, assess balance as needed    Consulted and Agree with Plan of Care Patient             Patient will benefit from skilled therapeutic intervention in order to improve the following deficits and impairments:  Difficulty walking, Decreased strength  Visit Diagnosis: 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)  Brain metastases (HCC)  Muscle weakness (generalized)     Problem List Patient Active  Problem List   Diagnosis Date Noted   Primary adenocarcinoma of upper lobe of left lung ([place]) 05/03/2021   Lung mass 04/24/2021   Brain metastases ([place]) 04/23/2021   Chronic intractable headache 09/09/2018   Chronic migraine without aura without status migrainosus, not intractable 09/09/2018   Morbid obesity ([place]) 09/09/2018   Medication overuse headache 09/09/2018   Cholecystitis 05/25/2015    Darlin Coco, PT 08/01/2021, 2:39 PM  [facility] [address] [place], [place], [address] Phone: [phone]   Fax:  [phone]  Name: Abigail Golden MRN: [id number] Date of Birth: 1965/07/08  Progress Note Reporting Period 06/20/21 to 08/01/21  See note below for Objective Data and Assessment of Progress/Goals.    Patient is making good progress with skilled physical therapy as evidenced by her subjective reports, functional mobility, and progress toward her goals. Recommend that she continue with her plan of care to address her remaining impairments to maximize her functional mobility.

## 2021-08-05 ENCOUNTER — Telehealth: Payer: Self-pay | Admitting: *Deleted

## 2021-08-05 ENCOUNTER — Inpatient Hospital Stay: Payer: BC Managed Care – PPO | Attending: Hematology

## 2021-08-05 ENCOUNTER — Telehealth: Payer: Self-pay

## 2021-08-05 ENCOUNTER — Other Ambulatory Visit: Payer: Self-pay

## 2021-08-05 ENCOUNTER — Ambulatory Visit (HOSPITAL_COMMUNITY)
Admission: RE | Admit: 2021-08-05 | Discharge: 2021-08-05 | Disposition: A | Discharge status: Home / Self Care | Payer: BC Managed Care – PPO | Source: Ambulatory Visit | Attending: Hematology | Admitting: Hematology

## 2021-08-05 DIAGNOSIS — C3412 Malignant neoplasm of upper lobe, left bronchus or lung: Secondary | ICD-10-CM | POA: Insufficient documentation

## 2021-08-05 DIAGNOSIS — R110 Nausea: Secondary | ICD-10-CM | POA: Insufficient documentation

## 2021-08-05 DIAGNOSIS — C7931 Secondary malignant neoplasm of brain: Secondary | ICD-10-CM | POA: Insufficient documentation

## 2021-08-05 DIAGNOSIS — Z923 Personal history of irradiation: Secondary | ICD-10-CM | POA: Insufficient documentation

## 2021-08-05 DIAGNOSIS — Z7952 Long term (current) use of systemic steroids: Secondary | ICD-10-CM | POA: Insufficient documentation

## 2021-08-05 DIAGNOSIS — R21 Rash and other nonspecific skin eruption: Secondary | ICD-10-CM | POA: Insufficient documentation

## 2021-08-05 DIAGNOSIS — R197 Diarrhea, unspecified: Secondary | ICD-10-CM | POA: Insufficient documentation

## 2021-08-05 DIAGNOSIS — Z79899 Other long term (current) drug therapy: Secondary | ICD-10-CM | POA: Insufficient documentation

## 2021-08-05 LAB — CMP (CANCER CENTER ONLY)
ALT: 16 U/L (ref 0–44)
AST: 19 U/L (ref 15–41)
Albumin: 4.3 g/dL (ref 3.5–5.0)
Alkaline Phosphatase: 58 U/L (ref 38–126)
Anion gap: 7 (ref 5–15)
BUN: 16 mg/dL (ref 6–20)
CO2: 26 mmol/L (ref 22–32)
Calcium: 9.3 mg/dL (ref 8.9–10.3)
Chloride: 104 mmol/L (ref 98–111)
Creatinine: 1.06 mg/dL — ABNORMAL HIGH (ref 0.44–1.00)
GFR, Estimated: >60 mL/min (ref >60)
Glucose, Bld: 114 mg/dL — ABNORMAL HIGH (ref 70–99)
Potassium: 4.3 mmol/L (ref 3.5–5.1)
Sodium: 137 mmol/L (ref 135–145)
Total Bilirubin: 0.4 mg/dL (ref 0.3–1.2)
Total Protein: 7.4 g/dL (ref 6.5–8.1)

## 2021-08-05 LAB — CBC WITH DIFFERENTIAL (CANCER CENTER ONLY)
Abs Immature Granulocytes: 0.02 10*3/uL (ref 0.00–0.07)
Basophils Absolute: 0 10*3/uL (ref 0.0–0.1)
Basophils Relative: 0 %
Eosinophils Absolute: 0 10*3/uL (ref 0.0–0.5)
Eosinophils Relative: 0 %
HCT: 33.5 % — ABNORMAL LOW (ref 36.0–46.0)
Hemoglobin: 11.1 g/dL — ABNORMAL LOW (ref 12.0–15.0)
Immature Granulocytes: 0 %
Lymphocytes Relative: 24 %
Lymphs Abs: 1.2 10*3/uL (ref 0.7–4.0)
MCH: 28.6 pg (ref 26.0–34.0)
MCHC: 33.1 g/dL (ref 30.0–36.0)
MCV: 86.3 fL (ref 80.0–100.0)
Monocytes Absolute: 0.4 10*3/uL (ref 0.1–1.0)
Monocytes Relative: 8 %
Neutro Abs: 3.3 10*3/uL (ref 1.7–7.7)
Neutrophils Relative %: 68 %
Platelet Count: 188 10*3/uL (ref 150–400)
RBC: 3.88 MIL/uL (ref 3.87–5.11)
RDW: 13.2 % (ref 11.5–15.5)
WBC Count: 5 10*3/uL (ref 4.0–10.5)
nRBC: 0 % (ref 0.0–0.2)

## 2021-08-05 MED ORDER — SODIUM CHLORIDE (PF) 0.9 % IJ SOLN
INTRAMUSCULAR | Status: AC
  Filled 2021-08-05: qty 50

## 2021-08-05 MED ORDER — IOHEXOL 300 MG/ML  SOLN
100.0000 mL | Freq: Once | INTRAMUSCULAR | Status: AC | PRN
  Administered 2021-08-05: 100 mL via INTRAVENOUS

## 2021-08-05 NOTE — Telephone Encounter (Signed)
Returned call to Abigail Golden [phone]) regarding message left that "she wants to pick up all paperwork when she comes in for today's appointment."   Advised form mailed to [address] [place] [address].  After faxed to HR specialist Joaquin Music [phone]) and e-mailed to her.    Records request are not handled by [facility].  Contact (SW) H.I.M. for status of records.     "HR received forms yet sticky notes were covering important information.  Will check mail today as I have not checked since last Tuesday."      This nurse removed sticky notes from forms before return to [facility], unable to [employer] through [employer] [id number].  E-mailed sticky notes attached to "Successful Tx Notice" to patient only as notes were patient instructions to "Complete, date July 24, 2020 and fax to [place]."

## 2021-08-05 NOTE — Telephone Encounter (Signed)
Pt called regarding FMLA/Disability paperwork.  Pt stated she got an email our FMLA/Disability team on 07/30/2021 but did not get the attachment of her completed paperwork.  Pt wanted to know if she could pick up the paperwork since she is coming to [facility] today for other appts.  Spoke with Starla Link in FMLA/Disability team and she said the completed paperwork was faxed to the pt's HR Department on 07/30/2021 and mailed to the pt's preferred address on 07/30/2021 as well.  Pt stated she would go by her mailbox today to see if she received the paperwork.  Pt has no further questions or concerns at this time.

## 2021-08-06 ENCOUNTER — Encounter: Payer: Self-pay | Admitting: Physical Therapy

## 2021-08-06 ENCOUNTER — Ambulatory Visit: Payer: BC Managed Care – PPO | Admitting: Physical Therapy

## 2021-08-06 DIAGNOSIS — M6281 Muscle weakness (generalized): Secondary | ICD-10-CM

## 2021-08-06 DIAGNOSIS — C7931 Secondary malignant neoplasm of brain: Secondary | ICD-10-CM

## 2021-08-06 DIAGNOSIS — C3412 Malignant neoplasm of upper lobe, left bronchus or lung: Secondary | ICD-10-CM

## 2021-08-06 NOTE — Therapy (Signed)
[facility] [place], [place], [address] Phone: [phone]   Fax:  [phone]  Physical Therapy Treatment  Patient Details  Name: NATALY PACIFICO MRN: [id number] Date of Birth: 1966/06/08 Referring Provider (PT): Dr. Burr Medico   Encounter Date: 08/06/2021   PT End of Session - 08/06/21 1351     Visit Number 12    Number of Visits 17    Date for PT Re-Evaluation 08/15/21    PT Start Time 8372    PT Stop Time 1430    PT Time Calculation (min) 41 min    Activity Tolerance Patient tolerated treatment well;Patient limited by fatigue    Behavior During Therapy [facility] for tasks assessed/performed             Past Medical History:  Diagnosis Date   Allergy    Anxiety    Chronic bronchitis ([place])    Depression    Headache    Hypoglycemia    Lower back pain    since fall ~ 2012 (05/25/2015)   Migraine    05/25/2015 "probably once/month"   Migraine headache    Pain in left hip    since fall ~ 2012 (05/25/2015)   Pneumonia "several times"    Past Surgical History:  Procedure Laterality Date   ABDOMINAL HYSTERECTOMY  11/2002   "found benign tumors"   BRONCHIAL BIOPSY  04/24/2021   Procedure: BRONCHIAL BIOPSIES;  Surgeon: Garner Nash, DO;  Location: [facility];  Service: Pulmonary;;   BRONCHIAL BRUSHINGS  04/24/2021   Procedure: BRONCHIAL BRUSHINGS;  Surgeon: Garner Nash, DO;  Location: [facility];  Service: Pulmonary;;   BRONCHIAL NEEDLE ASPIRATION BIOPSY  04/24/2021   Procedure: BRONCHIAL NEEDLE ASPIRATION BIOPSIES;  Surgeon: Garner Nash, DO;  Location: [facility];  Service: Pulmonary;;   CHEST TUBE INSERTION  04/24/2021   Procedure: CHEST TUBE INSERTION;  Surgeon: Garner Nash, DO;  Location: [facility];  Service: Pulmonary;;   CHOLECYSTECTOMY N/A 05/25/2015   Procedure: LAPAROSCOPIC CHOLECYSTECTOMY;  Surgeon: Ralene Ok, MD;  Location: [facility];  Service: General;  Laterality: N/A;   LAPAROSCOPIC  CHOLECYSTECTOMY  05/25/2015   MENISCUS REPAIR Right 03/2017   VIDEO BRONCHOSCOPY WITH ENDOBRONCHIAL NAVIGATION N/A 04/24/2021   Procedure: VIDEO BRONCHOSCOPY WITH ENDOBRONCHIAL NAVIGATION;  Surgeon: Garner Nash, DO;  Location: [facility];  Service: Pulmonary;  Laterality: N/A;   VIDEO BRONCHOSCOPY WITH RADIAL ENDOBRONCHIAL ULTRASOUND  04/24/2021   Procedure: VIDEO BRONCHOSCOPY WITH RADIAL ENDOBRONCHIAL ULTRASOUND;  Surgeon: Garner Nash, DO;  Location: [facility];  Service: Pulmonary;;    There were no vitals filed for this visit.   Subjective Assessment - 08/06/21 1350     Subjective Reports that she has been using ice at home,    Pertinent History Stage IV NSCLC with brain mets. Radiation to the brain 3 treatments. chemo pill every day. Hx of knee debridement Rt    Limitations House hold activities;Walking;Sitting    How long can you walk comfortably? 46mn    Patient Stated Goals can I build some strength and maybe return to work    Currently in Pain? Other (Comment)   No pain assessment               [facility] PT Assessment - 08/06/21 0001       Assessment   Medical Diagnosis non small cell lung cancer with mets to brain    Referring Provider (PT) Dr. FBurr Medico   Onset Date/Surgical Date 05/23/21  Hand Dominance Right    Next MD Visit 08/07/2021    Prior Therapy yes at [facility] for the knee      Precautions   Precaution Comments cancer mets, falls                           [facility] Adult PT Treatment/Exercise - 08/06/21 0001       Knee/Hip Exercises: Aerobic   Nustep L3 x15 min      Knee/Hip Exercises: Standing   Heel Raises Both;15 reps    Heel Raises Limitations B toe raise x15 reps    Hip Flexion AROM;Both;15 reps;Knee bent    Hip Abduction AROM;Both;15 reps;Knee straight      Knee/Hip Exercises: Seated   Long Arc Quad Strengthening;Both;20 reps;Weights    Long Arc Quad Weight 2 lbs.    Hamstring Curl Strengthening;Both;15  reps;Limitations    Hamstring Limitations red theraband                          PT Long Term Goals - 07/29/21 1343       PT LONG TERM GOAL #1   Title Pt will be independent in her HEP and progression to work through cancer related fatigue    Time 8    Period Weeks    Status Achieved    Target Date 08/15/21      PT LONG TERM GOAL #2   Title Pt will be able to perform sit to stand x 8 in 30 seconds to demonstrate improved mobility and function    Time 8    Period Weeks    Status On-going    Target Date 08/15/21      PT LONG TERM GOAL #3   Title Pt will be able to return to home independently with modifications as needed safely    Time 8    Period Weeks    Status On-going    Target Date 08/15/21                   Plan - 08/06/21 1435     Clinical Impression Statement Patient presented in clinic with intermittant reports of SOB which subsided. A more slow, controlled reps and therex session to avoid continuous SOB episodes. More trunk leaning and UE support observed during standing exercise.    Personal Factors and Comorbidities Comorbidity 3+    Comorbidities met lung cancer, brain mets, radiation hx    Examination-Activity Limitations Bathing;Lift;Stand;Locomotion Level;Bend;Stairs    Examination-Participation Restrictions Cleaning;Meal Prep;Yard Work;Community Activity;Occupation;Driving    Stability/Clinical Decision Making Evolving/Moderate complexity    Rehab Potential Good    PT Frequency 2x / week    PT Duration 8 weeks    PT Treatment/Interventions ADLs/Self Care Home Management;Therapeutic exercise;Patient/family education;Gait training;Neuromuscular re-education;Balance training;DME Instruction;Stair training    PT Next Visit Plan start general UE/LE therex - low level to start, assess balance as needed    Consulted and Agree with Plan of Care Patient             Patient will benefit from skilled therapeutic intervention in order to  improve the following deficits and impairments:  Difficulty walking, Decreased strength  Visit Diagnosis: 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)  Brain metastases (HCC)  Muscle weakness (generalized)     Problem List Patient Active Problem List   Diagnosis Date Noted   Primary adenocarcinoma of upper lobe of left lung (  [place]) 05/03/2021   Lung mass 04/24/2021   Brain metastases ([place]) 04/23/2021   Chronic intractable headache 09/09/2018   Chronic migraine without aura without status migrainosus, not intractable 09/09/2018   Morbid obesity ([place]) 09/09/2018   Medication overuse headache 09/09/2018   Cholecystitis 05/25/2015    Standley Brooking, PTA 08/06/2021, 2:40 PM  [facility] [address] [place], [place], [address] Phone: [phone]   Fax:  [phone]  Name: TEQUILA ROTTMANN MRN: [id number] Date of Birth: 1966/02/03

## 2021-08-07 ENCOUNTER — Other Ambulatory Visit (HOSPITAL_COMMUNITY): Payer: Self-pay

## 2021-08-07 ENCOUNTER — Inpatient Hospital Stay: Payer: BC Managed Care – PPO | Admitting: Hematology

## 2021-08-07 ENCOUNTER — Encounter: Payer: Self-pay | Admitting: Hematology

## 2021-08-07 ENCOUNTER — Other Ambulatory Visit: Payer: Self-pay | Admitting: Hematology

## 2021-08-07 ENCOUNTER — Other Ambulatory Visit: Payer: Self-pay

## 2021-08-07 VITALS — BP 125/78 | HR 79 | Temp 97.8°F | Resp 19 | Ht 60.0 in | Wt 287.1 lb

## 2021-08-07 DIAGNOSIS — Z923 Personal history of irradiation: Secondary | ICD-10-CM | POA: Diagnosis not present

## 2021-08-07 DIAGNOSIS — Z79899 Other long term (current) drug therapy: Secondary | ICD-10-CM | POA: Diagnosis not present

## 2021-08-07 DIAGNOSIS — C3412 Malignant neoplasm of upper lobe, left bronchus or lung: Secondary | ICD-10-CM | POA: Diagnosis present

## 2021-08-07 DIAGNOSIS — C7931 Secondary malignant neoplasm of brain: Secondary | ICD-10-CM

## 2021-08-07 DIAGNOSIS — R21 Rash and other nonspecific skin eruption: Secondary | ICD-10-CM | POA: Diagnosis not present

## 2021-08-07 DIAGNOSIS — R197 Diarrhea, unspecified: Secondary | ICD-10-CM | POA: Diagnosis not present

## 2021-08-07 DIAGNOSIS — R110 Nausea: Secondary | ICD-10-CM | POA: Diagnosis not present

## 2021-08-07 DIAGNOSIS — Z7952 Long term (current) use of systemic steroids: Secondary | ICD-10-CM | POA: Diagnosis not present

## 2021-08-07 MED ORDER — NYSTATIN 100000 UNIT/GM EX POWD: 1.0000 "application " | Freq: Three times a day (TID) | CUTANEOUS | 1 refills | Status: DC

## 2021-08-07 NOTE — Progress Notes (Signed)
Abigail Golden   [phone] [phone]   Clinic Follow up Note   Patient Care Team: Aletha Halim., PA-C as PCP - General (Family Medicine) Valrie Hart, RN as Oncology Nurse Navigator (Oncology)  Date of Service:  08/07/2021  CHIEF COMPLAINT: f/u of metastatic lung cancer  CURRENT THERAPY:  Tagrisso, 80 mg daily, started 05/22/21  ASSESSMENT & PLAN:  Abigail Golden is a 56 y.o. female with   1. 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 Brain MRI showed multifocal metastatic disease. -staging CT CAP 04/23/21 showed: 4.5 cm LUL mass that abuts pleural surface; no pathologically enlarged lymph nodes or metastatic disease. -bronchoscopy on 04/24/21 under Dr. Valeta Harms showed malignant cells consistent with adenocarcinoma, insufficient tissue for molecular studies. Guardant 360 was obtained and showed EGFR L858R mutation, which predicts good response to EGFR inhibitor. -PET scan 05/13/21 showed LUL mass extending along superior mediastinal border from hilum towards apex.  No lymph node or other distant metastasis. -she started Tagrisso on 05/22/21. She tolerates moderately well with mucositis and skin toxicities.  -restaging CT CAP on 08/05/21 showing: no substantial change in LUL lesion; slightly enlarging  adjacent prevascular/AP window lymph nodes; development of upper normal bilateral common iliac and left external iliac lymph nodes; no evidence for metastatic disease in abdomen/pelvis. I reviewed the images and discussed the results with them today. I feel that the lymph nodes could be reactive and not definitive for cancer progression. We will repeat a CT in 2 months. If it remains indeterminate, I will order PET scan. -continue Tagrisso    2. Symptom Management: Rash,  Nausea, Diarrhea -secondary to Tagrisso -she no longer needs the magic mouthwash or pain medication. -I refilled her nystatin powder for her groin/abdomen yeast infection. -she continues on prednisone 5 mg daily for her skin rash and fatigue. She reports improved fatigue but continued rash. I recommend she try to come off the prednisone; she will cut her remaining 5 mg tablets in half for the next two weeks. -intermittent diarrhea managed with imodium (worsened after CT contrast).   3. Brain Metastases -brain MRI 04/23/21 showed 2 brain masses with moderate vasogenic edema left parietal lobe and left inferior frontal gyrus.  There were 2 other small metastases in the right frontal gyrus and right posterior temporal lobe and questionable 2 additional punctate metastases in the superior frontal gyrus. -she received three fractions of SRS under Dr. Tammi Klippel 11/16-11/21/22. -she is scheduled for surveillance brain MRI on 08/16/21.   4. 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 [name] is now 54 and the patient lives with her and she helps the patient due to her recent diagnosis. 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  -she is in touch with her father, [name], but [name] describes him as being unable to fully comprehend her situation. -The patient has strong social support at church and has several people that can drive her to appointments. -Discussed with the patient that we have a Education officer, museum onsite if she ever has any concerns with transportation -The patient submitted FMLA paperwork, as she works as a Careers information officer.     Plan:  -continue Tagrisso 10m daily -taper prednisone (cut 5 mg tablets in half, take once daily for one week then every other day for one week then  stop) -I refilled nystatin powder -lab and f/u in 4 weeks -will order restaging CT scan at next visit -she required IV placement by the IV team   No problem-specific  Assessment & Plan notes found for this encounter.   SUMMARY OF ONCOLOGIC HISTORY: Oncology History Overview Note   Cancer Staging  Primary adenocarcinoma of upper lobe of left lung ([place]) Staging form: Lung, AJCC 8th Edition - Clinical stage from 04/24/2021: Stage IVA (cT2, cN2, pM1b) - Signed by Truitt Merle, MD on 05/14/2021    Primary adenocarcinoma of upper lobe of left lung ([place])  04/22/2021 Imaging   CT HEAD WO CONTRAST   IMPRESSION: Two separate areas of vasogenic edema within the left hemisphere, 1 within the inferior frontal region in the other at the left parietal vertex. Metastatic disease would be the most likely cause of this appearance. Other possibilities include venous infarctions and cerebritis. MRI with contrast recommended.    04/23/2021 Imaging   MR Brain W and Wo Contrast  IMPRESSION: 1. Metastatic disease to the brain. Two enhancing brain masses with moderate associated vasogenic edema: solid 16 mm left parietal lobe mass, and a larger 29 mm rim enhancing cystic or necrotic mass in the left inferior frontal gyrus. Two other small 2-3 mm metastases in the right inferior frontal gyrus, right posterior temporal lobe. And questionable two additional punctate metastases in both superior frontal gyri abutting the falx (series 18, image 46). 2. No significant intracranial mass effect. No other intracranial abnormality.   04/23/2021 Imaging   CT Chest W Contrast  IMPRESSION: 4.5 cm spiculated left upper lobe mass most compatible with primary lung cancer. This abuts the medial pleural surface. A few small adjacent pre-vascular mediastinal lymph nodes, none pathologically enlarged. Recommend cardiothoracic surgical and oncologic consultation.   No acute findings or evidence of metastatic disease in the abdomen or pelvis.   04/24/2021 Cancer Staging   Staging form: Lung, AJCC 8th Edition - Clinical stage from 04/24/2021: Stage IVA (cT2, cN2, pM1b) - Signed by Truitt Merle, MD on  05/14/2021    04/24/2021 Initial Biopsy   FINAL MICROSCOPIC DIAGNOSIS:   A. LUNG, LUL, FINE NEEDLE ASPIRATION:  - Malignant cells present, consistent with adenocarcinoma   B. LUNG, LUL, BRUSHING:  - Malignant cells consistent with adenocarcinoma, see comment    COMMENT:  B.  Immunohistochemical stains show that the tumor cells are positive for TTF-1 while they are negative for p63 and CK5/6, consistent with above interpretation. The number of tumor cells appears to be insufficient for molecular studies.   05/03/2021 Initial Diagnosis   Lung cancer ([place])   05/08/2021 - 05/13/2021 Radiation Therapy   SRS treatment to the metastatic brain lesions under the care of Dr. Tammi Klippel    08/05/2021 Imaging   EXAM: CT CHEST, ABDOMEN, AND PELVIS WITH CONTRAST  IMPRESSION: 1. No substantial change in size of the left upper lobe pulmonary lesion. 2. Borderline to mildly enlarged adjacent prevascular/AP window lymph nodes have increased in size in the interval. This finding raises concern for disease progression. 3. Interval development of upper normal bilateral common iliac and left external iliac lymph nodes. Close attention on follow-up recommended. 4. No other evidence for metastatic disease in the abdomen or pelvis.      INTERVAL HISTORY:  Abigail Golden is here for a follow up of metastatic lung cancer. She was last seen by me on 07/10/21. She presents to the clinic accompanied by [facility]. She reports the prednisone helped her energy, but her rash  persists. She also reports some difficulty taking deep breaths.   All other systems were reviewed with the patient and are negative.  MEDICAL HISTORY:  Past Medical History:  Diagnosis Date   Allergy    Anxiety    Chronic bronchitis ([place])    Depression    Headache    Hypoglycemia    Lower back pain    since fall ~ 2012 (05/25/2015)   Migraine    05/25/2015 "probably once/month"   Migraine headache    Pain in left hip    since  fall ~ 2012 (05/25/2015)   Pneumonia "several times"    SURGICAL HISTORY: Past Surgical History:  Procedure Laterality Date   ABDOMINAL HYSTERECTOMY  11/2002   "found benign tumors"   BRONCHIAL BIOPSY  04/24/2021   Procedure: BRONCHIAL BIOPSIES;  Surgeon: Garner Nash, DO;  Location: [facility];  Service: Pulmonary;;   BRONCHIAL BRUSHINGS  04/24/2021   Procedure: BRONCHIAL BRUSHINGS;  Surgeon: Garner Nash, DO;  Location: [facility];  Service: Pulmonary;;   BRONCHIAL NEEDLE ASPIRATION BIOPSY  04/24/2021   Procedure: BRONCHIAL NEEDLE ASPIRATION BIOPSIES;  Surgeon: Garner Nash, DO;  Location: [facility];  Service: Pulmonary;;   CHEST TUBE INSERTION  04/24/2021   Procedure: CHEST TUBE INSERTION;  Surgeon: Garner Nash, DO;  Location: [facility];  Service: Pulmonary;;   CHOLECYSTECTOMY N/A 05/25/2015   Procedure: LAPAROSCOPIC CHOLECYSTECTOMY;  Surgeon: Ralene Ok, MD;  Location: [facility];  Service: General;  Laterality: N/A;   LAPAROSCOPIC CHOLECYSTECTOMY  05/25/2015   MENISCUS REPAIR Right 03/2017   VIDEO BRONCHOSCOPY WITH ENDOBRONCHIAL NAVIGATION N/A 04/24/2021   Procedure: VIDEO BRONCHOSCOPY WITH ENDOBRONCHIAL NAVIGATION;  Surgeon: Garner Nash, DO;  Location: [facility];  Service: Pulmonary;  Laterality: N/A;   VIDEO BRONCHOSCOPY WITH RADIAL ENDOBRONCHIAL ULTRASOUND  04/24/2021   Procedure: VIDEO BRONCHOSCOPY WITH RADIAL ENDOBRONCHIAL ULTRASOUND;  Surgeon: Garner Nash, DO;  Location: [facility];  Service: Pulmonary;;    I have reviewed the social history and family history with the patient and they are unchanged from previous note.  ALLERGIES:  is allergic to celebrex [celecoxib] and minocycline.  MEDICATIONS:  Current Outpatient Medications  Medication Sig Dispense Refill   acetaminophen (TYLENOL) 500 MG tablet Take 500 mg by mouth every 6 (six) hours as needed for moderate pain or headache.     albuterol (VENTOLIN HFA) 108 (90 Base) MCG/ACT inhaler  Inhale 2 puffs into the lungs every 4 (four) hours as needed.     ALPRAZolam (XANAX) 0.25 MG tablet Take 0.25 mg by mouth every 4 (four) hours as needed for anxiety. for anxiety     aspirin EC 81 MG tablet Take 81 mg by mouth daily. Swallow whole.     citalopram (CELEXA) 10 MG tablet Take 10 mg by mouth at bedtime.     clindamycin (CLEOCIN T) 1 % external solution Apply topically 2 (two) times daily. 30 mL 0   clindamycin-benzoyl peroxide (BENZACLIN) gel Apply to face 2 times a day 25 g 1   diclofenac sodium (VOLTAREN) 1 % GEL Apply topically as needed. To affected area     diphenhydrAMINE (BENADRYL) 25 MG tablet Take 25 mg by mouth See admin instructions. 25 mg at bedtime 25 mg as needed for itching     Docusate Sodium (STOOL SOFTENER LAXATIVE PO) Take 1 tablet by mouth as needed.     eletriptan (RELPAX) 20 MG tablet Take 20 mg by mouth as needed for headache (at onset of migraine.). May repeat in  2 hours if needed.     fluticasone (FLONASE) 50 MCG/ACT nasal spray Place 2 sprays into both nostrils daily as needed for allergies.     hydrOXYzine (ATARAX/VISTARIL) 25 MG tablet Take 25 mg by mouth See admin instructions. 25 mg at bedtime 25 mg during the day as needed for itching/anxiety     loratadine (CLARITIN) 10 MG tablet Take 10 mg by mouth daily.     magic mouthwash (nystatin, lidocaine, diphenhydrAMINE, alum & mag hydroxide) suspension Swish and swallow 5 mls by mouth every 6 hours as needed 250 mL 1   magic mouthwash w/lidocaine SOLN Take 5 mLs by mouth every 6 hours as needed for mouth pain. Swish and Swallow 250 mL 1   montelukast (SINGULAIR) 10 MG tablet Take 10 mg by mouth at bedtime.     nystatin (MYCOSTATIN/NYSTOP) powder Apply 1 application topically 3 (three) times daily. 60 g 1   ondansetron (ZOFRAN-ODT) 4 MG disintegrating tablet Take 1 tablet (4 mg total) by mouth every 8 (eight) hours as needed for nausea or vomiting. 20 tablet 0   osimertinib mesylate (TAGRISSO) 80 MG tablet  Take 1 tablet (80 mg total) by mouth daily. 30 tablet 2   predniSONE (DELTASONE) 5 MG tablet Take 1 tablet (5 mg total) by mouth daily with breakfast. Take 2 tabs daily for 7 days then 1 tab daily 40 tablet 0   prochlorperazine (COMPAZINE) 10 MG tablet Take 1 tablet (10 mg total) by mouth every 6 (six) hours as needed for nausea or vomiting. 30 tablet 0   solifenacin (VESICARE) 10 MG tablet Take 10 mg by mouth every evening.     No current facility-administered medications for this visit.    PHYSICAL EXAMINATION: ECOG PERFORMANCE STATUS: 2 - Symptomatic, <50% confined to bed  Vitals:   08/07/21 1325  BP: 125/78  Pulse: 79  Resp: 19  Temp: 97.8 F (36.6 C)  SpO2: 99%   Wt Readings from Last 3 Encounters:  08/07/21 287 lb 1.6 oz (130.2 kg)  06/07/21 292 lb 6.4 oz (132.6 kg)  05/03/21 276 lb 6.4 oz (125.4 kg)     GENERAL:alert, no distress and comfortable SKIN: skin color normal, no rashes or significant lesions except mild skin erythema at right arm  EYES: normal, Conjunctiva are pink and non-injected, sclera clear  NEURO: alert & oriented x 3 with fluent speech  LABORATORY DATA:  I have reviewed the data as listed CBC Latest Ref Rng & Units 08/05/2021 07/10/2021 06/07/2021  WBC 4.0 - 10.5 K/uL 5.0 3.7(L) 4.5  Hemoglobin 12.0 - 15.0 g/dL 11.1(L) 10.7(L) 11.0(L)  Hematocrit 36.0 - 46.0 % 33.5(L) 32.9(L) 32.7(L)  Platelets 150 - 400 K/uL 188 198 129(L)     CMP Latest Ref Rng & Units 08/05/2021 07/10/2021 06/07/2021  Glucose 70 - 99 mg/dL 114(H) 115(H) 103(H)  BUN 6 - 20 mg/dL _0 Creatinine 0.44 - 1.00 mg/dL 1.06(H) 1.10(H) 0.90  Sodium 135 - 145 mmol/L 137 140 138  Potassium 3.5 - 5.1 mmol/L 4.3 3.8 3.6  Chloride 98 - 111 mmol/L 104 108 104  CO2 22 - 32 mmol/L _1 Calcium 8.9 - 10.3 mg/dL 9.3 9.7 9.1  Total Protein 6.5 - 8.1 g/dL 7.4 7.7 6.9  Total Bilirubin 0.3 - 1.2 mg/dL 0.4 0.5 0.6  Alkaline Phos 38 - 126 U/L 58 55 63  AST 15 - 41 U/L _2 ALT 0 -  44 U/L 16 11 24  RADIOGRAPHIC STUDIES: I have personally reviewed the radiological images as listed and agreed with the findings in the report. No results found.    No orders of the defined types were placed in this encounter.  All questions were answered. The patient knows to call the clinic with any problems, questions or concerns. No barriers to learning was detected. The total time spent in the appointment was 30 minutes.     Truitt Merle, MD 08/07/2021   I, Wilburn Mylar, am acting as scribe for Truitt Merle, MD.   I have reviewed the above documentation for accuracy and completeness, and I agree with the above.

## 2021-08-08 ENCOUNTER — Ambulatory Visit: Payer: BC Managed Care – PPO | Admitting: Physical Therapy

## 2021-08-08 ENCOUNTER — Other Ambulatory Visit (HOSPITAL_COMMUNITY): Payer: Self-pay

## 2021-08-08 ENCOUNTER — Encounter: Payer: Self-pay | Admitting: Physical Therapy

## 2021-08-08 DIAGNOSIS — C7931 Secondary malignant neoplasm of brain: Secondary | ICD-10-CM

## 2021-08-08 DIAGNOSIS — C3412 Malignant neoplasm of upper lobe, left bronchus or lung: Secondary | ICD-10-CM | POA: Diagnosis not present

## 2021-08-08 DIAGNOSIS — M6281 Muscle weakness (generalized): Secondary | ICD-10-CM

## 2021-08-08 NOTE — Therapy (Signed)
[facility] [place], [place], [address] Phone: [phone]   Fax:  [phone]  Physical Therapy Treatment  Patient Details  Name: Abigail Golden MRN: [id number] Date of Birth: 12/05/1965 Referring Provider (PT): Dr. Burr Medico   Encounter Date: 08/08/2021   PT End of Session - 08/08/21 1354     Visit Number 13    Number of Visits 17    Date for PT Re-Evaluation 08/15/21    PT Start Time 8250    PT Stop Time 1433    PT Time Calculation (min) 44 min    Activity Tolerance Patient tolerated treatment well    Behavior During Therapy [facility] for tasks assessed/performed             Past Medical History:  Diagnosis Date   Allergy    Anxiety    Chronic bronchitis ([place])    Depression    Headache    Hypoglycemia    Lower back pain    since fall ~ 2012 (05/25/2015)   Migraine    05/25/2015 "probably once/month"   Migraine headache    Pain in left hip    since fall ~ 2012 (05/25/2015)   Pneumonia "several times"    Past Surgical History:  Procedure Laterality Date   ABDOMINAL HYSTERECTOMY  11/2002   "found benign tumors"   BRONCHIAL BIOPSY  04/24/2021   Procedure: BRONCHIAL BIOPSIES;  Surgeon: Garner Nash, DO;  Location: [facility];  Service: Pulmonary;;   BRONCHIAL BRUSHINGS  04/24/2021   Procedure: BRONCHIAL BRUSHINGS;  Surgeon: Garner Nash, DO;  Location: [facility] [facility];  Service: Pulmonary;;   BRONCHIAL NEEDLE ASPIRATION BIOPSY  04/24/2021   Procedure: BRONCHIAL NEEDLE ASPIRATION BIOPSIES;  Surgeon: Garner Nash, DO;  Location: [facility];  Service: Pulmonary;;   CHEST TUBE INSERTION  04/24/2021   Procedure: CHEST TUBE INSERTION;  Surgeon: Garner Nash, DO;  Location: [facility];  Service: Pulmonary;;   CHOLECYSTECTOMY N/A 05/25/2015   Procedure: LAPAROSCOPIC CHOLECYSTECTOMY;  Surgeon: Ralene Ok, MD;  Location: [place];  Service: General;  Laterality: N/A;   LAPAROSCOPIC CHOLECYSTECTOMY  05/25/2015    MENISCUS REPAIR Right 03/2017   VIDEO BRONCHOSCOPY WITH ENDOBRONCHIAL NAVIGATION N/A 04/24/2021   Procedure: VIDEO BRONCHOSCOPY WITH ENDOBRONCHIAL NAVIGATION;  Surgeon: Garner Nash, DO;  Location: [place];  Service: Pulmonary;  Laterality: N/A;   VIDEO BRONCHOSCOPY WITH RADIAL ENDOBRONCHIAL ULTRASOUND  04/24/2021   Procedure: VIDEO BRONCHOSCOPY WITH RADIAL ENDOBRONCHIAL ULTRASOUND;  Surgeon: Garner Nash, DO;  Location: [facility];  Service: Pulmonary;;    There were no vitals filed for this visit.   Subjective Assessment - 08/08/21 1347     Subjective Reports that has had minimal increase in lung mass but they are not too concerned with right now.    Pertinent History Stage IV NSCLC with brain mets. Radiation to the brain 3 treatments. chemo pill every day. Hx of knee debridement Rt    Limitations House hold activities;Walking;Sitting    How long can you walk comfortably? 26mn    Patient Stated Goals can I build some strength and maybe return to work    Currently in Pain? No/denies                [facility]       Assessment   Medical Diagnosis non small cell lung cancer with mets to brain    Referring Provider (PT) Dr. FBurr Medico   Onset Date/Surgical Date 05/23/21  Hand Dominance Right    Next MD Visit 08/07/2021    Prior Therapy yes at [facility] for the knee      Precautions   Precaution Comments cancer mets, falls                           [facility] Adult PT Treatment/Exercise - 08/08/21 0001       Knee/Hip Exercises: Aerobic   Nustep L3 x20 min      Knee/Hip Exercises: Standing   Hip Abduction AROM;Both;2 sets;10 reps;Knee straight    Forward Step Up 10 reps;Hand Hold: 2;Step Height: 6";Right   LLE attempted but unable due to pain     Knee/Hip Exercises: Seated   Long Arc Quad Strengthening;Both;20 reps;Weights    Long Arc Quad Weight 3 lbs.    Hamstring Curl Strengthening;Both;15 reps;Limitations     Hamstring Limitations red theraband    Sit to Sand 10 reps;with UE support   elevated surface                         PT Long Term Goals - 07/29/21 1343       PT LONG TERM GOAL #1   Title Pt will be independent in her HEP and progression to work through cancer related fatigue    Time 8    Period Weeks    Status Achieved    Target Date 08/15/21      PT LONG TERM GOAL #2   Title Pt will be able to perform sit to stand x 8 in 30 seconds to demonstrate improved mobility and function    Time 8    Period Weeks    Status On-going    Target Date 08/15/21      PT LONG TERM GOAL #3   Title Pt will be able to return to home independently with modifications as needed safely    Time 8    Period Weeks    Status On-going    Target Date 08/15/21                   Plan - 08/08/21 1603     Clinical Impression Statement Patient presented in clinic with new updates regarding scans. Patient able to tolerate longer duration of Nustep today. Patient denied any SOB or fatigue during or after therex session. More standing executed but unable to tolerate much L forward step up due to previous injury and surgery.    Personal Factors and Comorbidities Comorbidity 3+    Comorbidities met lung cancer, brain mets, radiation hx    Examination-Activity Limitations Bathing;Lift;Stand;Locomotion Level;Bend;Stairs    Examination-Participation Restrictions Cleaning;Meal Prep;Yard Work;Community Activity;Occupation;Driving    Stability/Clinical Decision Making Evolving/Moderate complexity    Rehab Potential Good    PT Frequency 2x / week    PT Duration 8 weeks    PT Treatment/Interventions ADLs/Self Care Home Management;Therapeutic exercise;Patient/family education;Gait training;Neuromuscular re-education;Balance training;DME Instruction;Stair training    PT Next Visit Plan start general UE/LE therex - low level to start, assess balance as needed    Consulted and Agree with Plan of  Care Patient             Patient will benefit from skilled therapeutic intervention in order to improve the following deficits and impairments:  Difficulty walking, Decreased strength  Visit Diagnosis: 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)  Brain metastases (HCC)  Muscle weakness (generalized)  Problem List Patient Active Problem List   Diagnosis Date Noted   Primary adenocarcinoma of upper lobe of left lung ([place]) 05/03/2021   Lung mass 04/24/2021   Brain metastases ([place]) 04/23/2021   Chronic intractable headache 09/09/2018   Chronic migraine without aura without status migrainosus, not intractable 09/09/2018   Morbid obesity ([place]) 09/09/2018   Medication overuse headache 09/09/2018   Cholecystitis 05/25/2015    Standley Brooking, PTA 08/08/2021, 4:09 PM  [facility] [address] [place], [place], [address] Phone: [phone]   Fax:  [phone]  Name: Abigail Golden MRN: [id number] Date of Birth: Nov 07, 1965

## 2021-08-09 ENCOUNTER — Other Ambulatory Visit (HOSPITAL_COMMUNITY): Payer: Self-pay

## 2021-08-09 ENCOUNTER — Other Ambulatory Visit: Payer: Self-pay

## 2021-08-09 ENCOUNTER — Other Ambulatory Visit: Payer: Self-pay | Admitting: Hematology

## 2021-08-09 MED ORDER — OSIMERTINIB MESYLATE 80 MG PO TABS
80.0000 mg | ORAL_TABLET | Freq: Every day | ORAL | 2 refills | Status: DC
  Filled 2021-08-09: qty 30, 30d supply, fill #0
  Filled 2021-09-02: qty 30, 30d supply, fill #1
  Filled 2021-10-10: qty 30, 30d supply, fill #2

## 2021-08-12 ENCOUNTER — Encounter: Payer: Self-pay | Admitting: Physical Therapy

## 2021-08-12 ENCOUNTER — Ambulatory Visit: Payer: BC Managed Care – PPO | Admitting: Physical Therapy

## 2021-08-12 ENCOUNTER — Other Ambulatory Visit: Payer: Self-pay

## 2021-08-12 DIAGNOSIS — C3412 Malignant neoplasm of upper lobe, left bronchus or lung: Secondary | ICD-10-CM

## 2021-08-12 DIAGNOSIS — M6281 Muscle weakness (generalized): Secondary | ICD-10-CM

## 2021-08-12 DIAGNOSIS — C7931 Secondary malignant neoplasm of brain: Secondary | ICD-10-CM

## 2021-08-12 NOTE — Therapy (Signed)
[facility] [place], [place], [address] Phone: [phone]   Fax:  [phone]  Physical Therapy Treatment  Patient Details  Name: Abigail Golden MRN: [id number] Date of Birth: 05-27-1966 Referring Provider (PT): Dr. Burr Medico   Encounter Date: 08/12/2021   PT End of Session - 08/12/21 1443     Visit Number 14    Number of Visits 17    Date for PT Re-Evaluation 08/15/21    PT Start Time 1433    PT Stop Time 1520    PT Time Calculation (min) 47 min    Activity Tolerance Patient tolerated treatment well    Behavior During Therapy [facility] for tasks assessed/performed             Past Medical History:  Diagnosis Date   Allergy    Anxiety    Chronic bronchitis ([place])    Depression    Headache    Hypoglycemia    Lower back pain    since fall ~ 2012 (05/25/2015)   Migraine    05/25/2015 "probably once/month"   Migraine headache    Pain in left hip    since fall ~ 2012 (05/25/2015)   Pneumonia "several times"    Past Surgical History:  Procedure Laterality Date   ABDOMINAL HYSTERECTOMY  11/2002   "found benign tumors"   BRONCHIAL BIOPSY  04/24/2021   Procedure: BRONCHIAL BIOPSIES;  Surgeon: Garner Nash, DO;  Location: [facility];  Service: Pulmonary;;   BRONCHIAL BRUSHINGS  04/24/2021   Procedure: BRONCHIAL BRUSHINGS;  Surgeon: Garner Nash, DO;  Location: [facility];  Service: Pulmonary;;   BRONCHIAL NEEDLE ASPIRATION BIOPSY  04/24/2021   Procedure: BRONCHIAL NEEDLE ASPIRATION BIOPSIES;  Surgeon: Garner Nash, DO;  Location: [facility];  Service: Pulmonary;;   CHEST TUBE INSERTION  04/24/2021   Procedure: CHEST TUBE INSERTION;  Surgeon: Garner Nash, DO;  Location: [facility];  Service: Pulmonary;;   CHOLECYSTECTOMY N/A 05/25/2015   Procedure: LAPAROSCOPIC CHOLECYSTECTOMY;  Surgeon: Ralene Ok, MD;  Location: [facility] [facility];  Service: General;  Laterality: N/A;   LAPAROSCOPIC CHOLECYSTECTOMY  05/25/2015    MENISCUS REPAIR Right 03/2017   VIDEO BRONCHOSCOPY WITH ENDOBRONCHIAL NAVIGATION N/A 04/24/2021   Procedure: VIDEO BRONCHOSCOPY WITH ENDOBRONCHIAL NAVIGATION;  Surgeon: Garner Nash, DO;  Location: [place];  Service: Pulmonary;  Laterality: N/A;   VIDEO BRONCHOSCOPY WITH RADIAL ENDOBRONCHIAL ULTRASOUND  04/24/2021   Procedure: VIDEO BRONCHOSCOPY WITH RADIAL ENDOBRONCHIAL ULTRASOUND;  Surgeon: Garner Nash, DO;  Location: [facility];  Service: Pulmonary;;    There were no vitals filed for this visit.   Subjective Assessment - 08/12/21 1442     Subjective Woke up with a headache this morning. Didn't eat or take chemo med until around noon.    Pertinent History Stage IV NSCLC with brain mets. Radiation to the brain 3 treatments. chemo pill every day. Hx of knee debridement Rt    Limitations House hold activities;Walking;Sitting    How long can you walk comfortably? 44mn    Patient Stated Goals can I build some strength and maybe return to work    Currently in Pain? No/denies                [facility] Assessment - 08/12/21 0001       Assessment   Medical Diagnosis non small cell lung cancer with mets to brain    Referring Provider (PT) Dr. FBurr Medico   Onset Date/Surgical Date 05/23/21  Hand Dominance Right    Next MD Visit 08/07/2021    Prior Therapy yes at [facility] for the knee      Precautions   Precaution Comments cancer mets, falls                           [facility] Adult PT Treatment/Exercise - 08/12/21 0001       Knee/Hip Exercises: Aerobic   Nustep L3 x20 min      Knee/Hip Exercises: Standing   Heel Raises Both;20 reps    Heel Raises Limitations B toe raise x20 reps    Hip Flexion AROM;Both;15 reps;Knee bent    Hip Abduction AROM;Both;2 sets;10 reps;Knee straight      Knee/Hip Exercises: Seated   Long Arc Quad Strengthening;Both;20 reps;Weights    Long Arc Quad Weight 3 lbs.    Clamshell with TheraBand Red   x20 reps   Hamstring Curl  Strengthening;Both;15 reps;Limitations    Hamstring Limitations red theraband                          PT Long Term Goals - 07/29/21 1343       PT LONG TERM GOAL #1   Title Pt will be independent in her HEP and progression to work through cancer related fatigue    Time 8    Period Weeks    Status Achieved    Target Date 08/15/21      PT LONG TERM GOAL #2   Title Pt will be able to perform sit to stand x 8 in 30 seconds to demonstrate improved mobility and function    Time 8    Period Weeks    Status On-going    Target Date 08/15/21      PT LONG TERM GOAL #3   Title Pt will be able to return to home independently with modifications as needed safely    Time 8    Period Weeks    Status On-going    Target Date 08/15/21                   Plan - 08/12/21 1544     Clinical Impression Statement Patient presented in clinic with report of polyp findings around lung and pelvic region that is suggested as infectious and not tumors. Patient reported waking with a headache that was better after meds and rest. More SOB with activity observed and reported by patient. Slower pace encouraged with exercises to limit fatigue and SOB.    Personal Factors and Comorbidities Comorbidity 3+    Comorbidities met lung cancer, brain mets, radiation hx    Examination-Activity Limitations Bathing;Lift;Stand;Locomotion Level;Bend;Stairs    Examination-Participation Restrictions Cleaning;Meal Prep;Yard Work;Community Activity;Occupation;Driving    Stability/Clinical Decision Making Evolving/Moderate complexity    Rehab Potential Good    PT Frequency 2x / week    PT Duration 8 weeks    PT Treatment/Interventions ADLs/Self Care Home Management;Therapeutic exercise;Patient/family education;Gait training;Neuromuscular re-education;Balance training;DME Instruction;Stair training    PT Next Visit Plan start general UE/LE therex - low level to start, assess balance as needed     Consulted and Agree with Plan of Care Patient             Patient will benefit from skilled therapeutic intervention in order to improve the following deficits and impairments:  Difficulty walking, Decreased strength  Visit Diagnosis: Primary adenocarcinoma of upper lobe of left lung ([place])  Brain  metastases ([place])  Muscle weakness (generalized)     Problem List Patient Active Problem List   Diagnosis Date Noted   Primary adenocarcinoma of upper lobe of left lung ([place]) 05/03/2021   Lung mass 04/24/2021   Brain metastases ([place]) 04/23/2021   Chronic intractable headache 09/09/2018   Chronic migraine without aura without status migrainosus, not intractable 09/09/2018   Morbid obesity ([place]) 09/09/2018   Medication overuse headache 09/09/2018   Cholecystitis 05/25/2015    Standley Brooking, PTA 08/12/2021, 3:49 PM  [facility] [place], [place], [address] Phone: [phone]   Fax:  [phone]  Name: KAELEY VINJE MRN: [id number] Date of Birth: Nov 16, 1965

## 2021-08-15 ENCOUNTER — Ambulatory Visit: Payer: BC Managed Care – PPO | Admitting: Physical Therapy

## 2021-08-15 ENCOUNTER — Other Ambulatory Visit: Payer: Self-pay

## 2021-08-15 ENCOUNTER — Encounter: Payer: Self-pay | Admitting: Physical Therapy

## 2021-08-15 DIAGNOSIS — M6281 Muscle weakness (generalized): Secondary | ICD-10-CM

## 2021-08-15 DIAGNOSIS — C3412 Malignant neoplasm of upper lobe, left bronchus or lung: Secondary | ICD-10-CM | POA: Diagnosis not present

## 2021-08-15 DIAGNOSIS — C7931 Secondary malignant neoplasm of brain: Secondary | ICD-10-CM

## 2021-08-15 NOTE — Addendum Note (Signed)
Addended by: Darlin Coco on: 08/15/2021 04:36 PM   Modules accepted: Orders

## 2021-08-15 NOTE — Therapy (Signed)
[facility] [place], [place], [address] Phone: [phone]   Fax:  [phone]  Physical Therapy Treatment  Patient Details  Name: Abigail Golden MRN: [id number] Date of Birth: [birth year] Referring Provider (PT): Dr. Burr Medico   Encounter Date: 08/15/2021   PT End of Session - 08/15/21 1320     Visit Number 15    Number of Visits 17    Date for PT Re-Evaluation 08/15/21    PT Start Time 1302    PT Stop Time 1345    PT Time Calculation (min) 43 min    Activity Tolerance Patient tolerated treatment well    Behavior During Therapy [facility] for tasks assessed/performed             Past Medical History:  Diagnosis Date   Allergy    Anxiety    Chronic bronchitis ([place])    Depression    Headache    Hypoglycemia    Lower back pain    since fall ~ 2012 (05/25/2015)   Migraine    05/25/2015 "probably once/month"   Migraine headache    Pain in left hip    since fall ~ 2012 (05/25/2015)   Pneumonia "several times"    Past Surgical History:  Procedure Laterality Date   ABDOMINAL HYSTERECTOMY  11/2002   "found benign tumors"   BRONCHIAL BIOPSY  04/24/2021   Procedure: BRONCHIAL BIOPSIES;  Surgeon: Garner Nash, DO;  Location: [facility] [place] [facility];  Service: Pulmonary;;   BRONCHIAL BRUSHINGS  04/24/2021   Procedure: BRONCHIAL BRUSHINGS;  Surgeon: Garner Nash, DO;  Location: [facility] [facility];  Service: Pulmonary;;   BRONCHIAL NEEDLE ASPIRATION BIOPSY  04/24/2021   Procedure: BRONCHIAL NEEDLE ASPIRATION BIOPSIES;  Surgeon: Garner Nash, DO;  Location: [facility];  Service: Pulmonary;;   CHEST TUBE INSERTION  04/24/2021   Procedure: CHEST TUBE INSERTION;  Surgeon: Garner Nash, DO;  Location: [place] [facility];  Service: Pulmonary;;   CHOLECYSTECTOMY N/A 05/25/2015   Procedure: LAPAROSCOPIC CHOLECYSTECTOMY;  Surgeon: Ralene Ok, MD;  Location: [place];  Service: General;  Laterality: N/A;   LAPAROSCOPIC CHOLECYSTECTOMY  05/25/2015    MENISCUS REPAIR Right 03/2017   VIDEO BRONCHOSCOPY WITH ENDOBRONCHIAL NAVIGATION N/A 04/24/2021   Procedure: VIDEO BRONCHOSCOPY WITH ENDOBRONCHIAL NAVIGATION;  Surgeon: Garner Nash, DO;  Location: [place];  Service: Pulmonary;  Laterality: N/A;   VIDEO BRONCHOSCOPY WITH RADIAL ENDOBRONCHIAL ULTRASOUND  04/24/2021   Procedure: VIDEO BRONCHOSCOPY WITH RADIAL ENDOBRONCHIAL ULTRASOUND;  Surgeon: Garner Nash, DO;  Location: [place] [facility];  Service: Pulmonary;;    There were no vitals filed for this visit.   Subjective Assessment - 08/15/21 1316     Subjective Has an infection so doesn't know how much she could do to avoid rubbing.    Pertinent History Stage IV NSCLC with brain mets. Radiation to the brain 3 treatments. chemo pill every day. Hx of knee debridement Rt    Limitations House hold activities;Walking;Sitting    How long can you walk comfortably? 60mn    Patient Stated Goals can I build some strength and maybe return to work    Currently in Pain? No/denies                [facility] - 08/15/21 0001       Assessment   Medical Diagnosis non small cell lung cancer with mets to brain    Referring Provider (PT) Dr. FBurr Medico   Onset Date/Surgical Date 05/23/21    Hand Dominance  Right    Next MD Visit 08/07/2021    Prior Therapy yes at [facility] for the knee      Precautions   Precaution Comments cancer mets, falls                           [facility] Adult PT Treatment/Exercise - 08/15/21 0001       Knee/Hip Exercises: Aerobic   Nustep L3 x15 min      Knee/Hip Exercises: Seated   Long Arc Quad Strengthening;Both;20 reps;Weights    Long Arc Quad Weight 4 lbs.    Clamshell with TheraBand Red   x30 reps   Hamstring Curl Strengthening;Both;20 reps    Hamstring Limitations red theraband    Sit to Sand 10 reps;with UE support                          PT Long Term Goals - 07/29/21 1343       PT LONG TERM GOAL #1   Title  Pt will be independent in her HEP and progression to work through cancer related fatigue    Time 8    Period Weeks    Status Achieved    Target Date 08/15/21      PT LONG TERM GOAL #2   Title Pt will be able to perform sit to stand x 8 in 30 seconds to demonstrate improved mobility and function    Time 8    Period Weeks    Status On-going    Target Date 08/15/21      PT LONG TERM GOAL #3   Title Pt will be able to return to home independently with modifications as needed safely    Time 8    Period Weeks    Status On-going    Target Date 08/15/21                   Plan - 08/15/21 1440     Clinical Impression Statement Patient presented in clinic with reports of an infection towards her groin. Slower and less impact treatment completed as patient was wanting to avoid rubbing. More slow and purposeful reps completed as well with resistance. Less fatigue and SOB reported by patient. Patient has scans tomorrow and being purposeful with hydration today.    Personal Factors and Comorbidities Comorbidity 3+    Comorbidities met lung cancer, brain mets, radiation hx    Examination-Activity Limitations Bathing;Lift;Stand;Locomotion Level;Bend;Stairs    Examination-Participation Restrictions Cleaning;Meal Prep;Yard Work;Community Activity;Occupation;Driving    Stability/Clinical Decision Making Evolving/Moderate complexity    Rehab Potential Good    PT Frequency 2x / week    PT Duration 8 weeks    PT Treatment/Interventions ADLs/Self Care Home Management;Therapeutic exercise;Patient/family education;Gait training;Neuromuscular re-education;Balance training;DME Instruction;Stair training    PT Next Visit Plan start general UE/LE therex - low level to start, assess balance as needed    Consulted and Agree with Plan of Care Patient             Patient will benefit from skilled therapeutic intervention in order to improve the following deficits and impairments:  Difficulty  walking, Decreased strength  Visit Diagnosis: 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)  Brain metastases (HCC)  Muscle weakness (generalized)     Problem List Patient Active Problem List   Diagnosis Date Noted   Primary adenocarcinoma of upper lobe of left lung ([place]) 05/03/2021   Lung  mass 04/24/2021   Brain metastases ([place]) 04/23/2021   Chronic intractable headache 09/09/2018   Chronic migraine without aura without status migrainosus, not intractable 09/09/2018   Morbid obesity ([place]) 09/09/2018   Medication overuse headache 09/09/2018   Cholecystitis 05/25/2015    Standley Brooking, PTA 08/15/2021, 2:47 PM  [facility] [place], [place], [address] Phone: [phone]   Fax:  [phone]  Name: Abigail Golden MRN: [id number] Date of Birth: [birth year]

## 2021-08-16 ENCOUNTER — Ambulatory Visit (HOSPITAL_COMMUNITY)
Admission: RE | Admit: 2021-08-16 | Discharge: 2021-08-16 | Disposition: A | Discharge status: Home / Self Care | Payer: BC Managed Care – PPO | Source: Ambulatory Visit | Attending: Radiation Oncology | Admitting: Radiation Oncology

## 2021-08-16 DIAGNOSIS — C7931 Secondary malignant neoplasm of brain: Secondary | ICD-10-CM | POA: Diagnosis present

## 2021-08-16 MED ORDER — GADOBUTROL 1 MMOL/ML IV SOLN
10.0000 mL | Freq: Once | INTRAVENOUS | Status: AC | PRN
  Administered 2021-08-16: 10 mL via INTRAVENOUS

## 2021-08-17 NOTE — Progress Notes (Signed)
Abigail Golden, please set-up [facility]

## 2021-08-19 ENCOUNTER — Ambulatory Visit: Payer: BC Managed Care – PPO | Admitting: Physical Therapy

## 2021-08-19 ENCOUNTER — Encounter: Payer: Self-pay | Admitting: Physical Therapy

## 2021-08-19 ENCOUNTER — Inpatient Hospital Stay: Payer: BC Managed Care – PPO

## 2021-08-19 ENCOUNTER — Encounter: Payer: Self-pay | Admitting: Urology

## 2021-08-19 ENCOUNTER — Telehealth: Payer: Self-pay | Admitting: Radiation Therapy

## 2021-08-19 ENCOUNTER — Other Ambulatory Visit: Payer: Self-pay

## 2021-08-19 DIAGNOSIS — C3412 Malignant neoplasm of upper lobe, left bronchus or lung: Secondary | ICD-10-CM

## 2021-08-19 DIAGNOSIS — C7931 Secondary malignant neoplasm of brain: Secondary | ICD-10-CM

## 2021-08-19 DIAGNOSIS — M6281 Muscle weakness (generalized): Secondary | ICD-10-CM

## 2021-08-19 NOTE — Progress Notes (Addendum)
Spoke w/ patient, verified identity, and began nursing interview. Patient reports an episode of mild headache (managed w/ ibuprofen), no chest pains and no shortness of breath. No other issues reported at this time. ? ?Meaningful use complete. ?Hysterectomy-No chances of pregnancy. ? ?Patient reminded of her 8:30am-08/21/21 telephone appointment w/ Ashlyn Bruning PA-C. I left my extension [phone] in case patient needs anything. Patient verbalized understanding of information. ? ?Patient states "She is only available post 12:00pm". I told her that we would try to accommodate her timeframe as best we could, schedule permitting. ? ?Patient contact [phone]  ?

## 2021-08-19 NOTE — Therapy (Signed)
[facility] [place], [place], [address] Phone: [phone]   Fax:  [phone]  Physical Therapy Treatment  Patient Details  Name: Abigail Golden MRN: [id number] Date of Birth: 1966-02-01 Referring Provider (PT): Dr. Burr Medico   Encounter Date: 08/19/2021   PT End of Session - 08/19/21 1346     Visit Number 16    Number of Visits 17    Date for PT Re-Evaluation 09/06/21    PT Start Time 1302    PT Stop Time 1343    PT Time Calculation (min) 41 min    Activity Tolerance Patient tolerated treatment well    Behavior During Therapy [facility] for tasks assessed/performed             Past Medical History:  Diagnosis Date   Allergy    Anxiety    Chronic bronchitis ([place])    Depression    Headache    Hypoglycemia    Lower back pain    since fall ~ 2012 (05/25/2015)   Migraine    05/25/2015 "probably once/month"   Migraine headache    Pain in left hip    since fall ~ 2012 (05/25/2015)   Pneumonia "several times"    Past Surgical History:  Procedure Laterality Date   ABDOMINAL HYSTERECTOMY  11/2002   "found benign tumors"   BRONCHIAL BIOPSY  04/24/2021   Procedure: BRONCHIAL BIOPSIES;  Surgeon: Garner Nash, DO;  Location: [place] [facility];  Service: Pulmonary;;   BRONCHIAL BRUSHINGS  04/24/2021   Procedure: BRONCHIAL BRUSHINGS;  Surgeon: Garner Nash, DO;  Location: [place] [facility];  Service: Pulmonary;;   BRONCHIAL NEEDLE ASPIRATION BIOPSY  04/24/2021   Procedure: BRONCHIAL NEEDLE ASPIRATION BIOPSIES;  Surgeon: Garner Nash, DO;  Location: [place] [facility];  Service: Pulmonary;;   CHEST TUBE INSERTION  04/24/2021   Procedure: CHEST TUBE INSERTION;  Surgeon: Garner Nash, DO;  Location: [place] ENDOSCOPY;  Service: Pulmonary;;   CHOLECYSTECTOMY N/A 05/25/2015   Procedure: LAPAROSCOPIC CHOLECYSTECTOMY;  Surgeon: Ralene Ok, MD;  Location: [place];  Service: General;  Laterality: N/A;   LAPAROSCOPIC CHOLECYSTECTOMY  05/25/2015    MENISCUS REPAIR Right 03/2017   VIDEO BRONCHOSCOPY WITH ENDOBRONCHIAL NAVIGATION N/A 04/24/2021   Procedure: VIDEO BRONCHOSCOPY WITH ENDOBRONCHIAL NAVIGATION;  Surgeon: Garner Nash, DO;  Location: [place];  Service: Pulmonary;  Laterality: N/A;   VIDEO BRONCHOSCOPY WITH RADIAL ENDOBRONCHIAL ULTRASOUND  04/24/2021   Procedure: VIDEO BRONCHOSCOPY WITH RADIAL ENDOBRONCHIAL ULTRASOUND;  Surgeon: Garner Nash, DO;  Location: [place] [facility]OSCOPY;  Service: Pulmonary;;    There were no vitals filed for this visit.   Subjective Assessment - 08/19/21 1304     Subjective Infection is healing and on last day of meds for it. Read on MyChart that she has new brain mets but sees MD 08/21/2021 for official results.    Pertinent History Stage IV NSCLC with brain mets. Radiation to the brain 3 treatments. chemo pill every day. Hx of knee debridement Rt    Limitations House hold activities;Walking;Sitting    How long can you walk comfortably? 91mn    Patient Stated Goals can I build some strength and maybe return to work    Currently in Pain? No/denies                [facility]       Assessment   Medical Diagnosis non small cell lung cancer with mets to brain    Referring Provider (PT) Dr.  Feng    Onset Date/Surgical Date 05/23/21    Hand Dominance Right    Next MD Visit 08/22/2021    Prior Therapy yes at [facility] for [facility]      Precautions   Precaution Comments cancer mets, falls                           [facility] Adult PT Treatment/Exercise - 08/19/21 0001       Knee/Hip Exercises: Aerobic   Nustep L3 x15 min      Knee/Hip Exercises: Standing   Heel Raises Both;20 reps    Heel Raises Limitations B toe raise x20 reps    Hip Abduction AROM;Both;2 sets;10 reps;Knee straight    Forward Step Up Right;10 reps;Hand Hold: 2;Step Height: 4"      Knee/Hip Exercises: Seated   Long Arc Quad Strengthening;Both;20 reps;Weights    Long Arc Quad  Weight 4 lbs.    Hamstring Curl Strengthening;Both;20 reps    Hamstring Limitations red theraband                          PT Long Term Goals - 07/29/21 1343       PT LONG TERM GOAL #1   Title Pt will be independent in her HEP and progression to work through cancer related fatigue    Time 8    Period Weeks    Status Achieved    Target Date 08/15/21      PT LONG TERM GOAL #2   Title Pt will be able to perform sit to stand x 8 in 30 seconds to demonstrate improved mobility and function    Time 8    Period Weeks    Status On-going    Target Date 08/15/21      PT LONG TERM GOAL #3   Title Pt will be able to return to home independently with modifications as needed safely    Time 8    Period Weeks    Status On-going    Target Date 08/15/21                   Plan - 08/19/21 1422     Clinical Impression Statement Patient presented in clinic with reports of new brain mets that she read on MyChart. Patient to see MD on 08/21/2021. Patient feels good but the notable SOB noted during standing. Infection is healing but still limited with some standing due to L knee pain and fatigue. Fatigue and SOB improves with sitting.    Personal Factors and Comorbidities Comorbidity 3+    Comorbidities met lung cancer, brain mets, radiation hx    Examination-Activity Limitations Bathing;Lift;Stand;Locomotion Level;Bend;Stairs    Examination-Participation Restrictions Cleaning;Meal Prep;Yard Work;Community Activity;Occupation;Driving    Stability/Clinical Decision Making Evolving/Moderate complexity    Rehab Potential Good    PT Frequency 2x / week    PT Duration 8 weeks    PT Treatment/Interventions ADLs/Self Care Home Management;Therapeutic exercise;Patient/family education;Gait training;Neuromuscular re-education;Balance training;DME Instruction;Stair training    PT Next Visit Plan start general UE/LE therex - low level to start, assess balance as needed    Consulted and  Agree with Plan of Care Patient             Patient will benefit from skilled therapeutic intervention in order to improve the following deficits and impairments:  Difficulty walking, Decreased strength  Visit Diagnosis: Primary adenocarcinoma of upper lobe of left  lung ([place])  Brain metastases ([place])  Muscle weakness (generalized)     Problem List Patient Active Problem List   Diagnosis Date Noted   Primary adenocarcinoma of upper lobe of left lung ([place]) 05/03/2021   Lung mass 04/24/2021   Brain metastases ([place]) 04/23/2021   Chronic intractable headache 09/09/2018   Chronic migraine without aura without status migrainosus, not intractable 09/09/2018   Morbid obesity ([place]) 09/09/2018   Medication overuse headache 09/09/2018   Cholecystitis 05/25/2015    Standley Brooking, PTA 08/19/2021, 2:28 PM  [facility] [address] [place], [place], [address] Phone: [phone]   Fax:  [phone]  Name: Abigail Golden MRN: [id number] Date of Birth: 02-09-1966

## 2021-08-19 NOTE — Telephone Encounter (Signed)
I called Ms. Marines to discuss the results of her recent brain MRI and the recommendations from the brain and spine conference held this morning. The patient is doing well, tolerating Tag[place]so well, and does not have any new or worsening symptoms since the completion of her first radiosurgery course on 05/13/2021.   The 08/16/21 brain MRI demonstrated two new lesions: 1. 3 mm enhancing lesion in the left frontal lobe along the superior frontal sulcus with minimal edema (series 1100, image 226). 2. 4 mm enhancing lesion in the left superior frontal gyrus with mild edema (series 1100, image 220).   Dr. Tammi Klippel would like to offer salvage SRS to treat these two new targets with a single fraction. Langley Gauss had already seen the results of her scan in MyChart and was hopeful that she would be able to receive targeted treatment to these areas. She was happy to hear that the lesions are small and would only require a single fraction, instead of the three, as her first course.   Ms. Szczesny is scheduled to come in Wed 3/1 for simulation and treatment will be set up for early next week. Langley Gauss reminded me that it is very difficult to get an IV started for her and that she prefers the IV team be called for her IV access. I have passed this along to Dr. Johny Shears nurse and included it in the appointment comments for reference.   Mont Dutton R.T.(R)(T) Radiation Special Procedures Navigator

## 2021-08-20 ENCOUNTER — Other Ambulatory Visit: Payer: Self-pay | Admitting: Hematology

## 2021-08-20 DIAGNOSIS — C3412 Malignant neoplasm of upper lobe, left bronchus or lung: Secondary | ICD-10-CM

## 2021-08-21 ENCOUNTER — Other Ambulatory Visit: Payer: Self-pay

## 2021-08-21 ENCOUNTER — Ambulatory Visit
Admission: RE | Admit: 2021-08-21 | Discharge: 2021-08-21 | Disposition: A | Discharge status: Home / Self Care | Payer: BC Managed Care – PPO | Source: Ambulatory Visit | Attending: Radiation Oncology | Admitting: Radiation Oncology

## 2021-08-21 ENCOUNTER — Ambulatory Visit: Payer: BC Managed Care – PPO

## 2021-08-21 VITALS — BP 142/78 | HR 84 | Temp 96.5°F | Resp 18

## 2021-08-21 DIAGNOSIS — C7931 Secondary malignant neoplasm of brain: Secondary | ICD-10-CM | POA: Diagnosis present

## 2021-08-21 DIAGNOSIS — C3412 Malignant neoplasm of upper lobe, left bronchus or lung: Secondary | ICD-10-CM | POA: Diagnosis not present

## 2021-08-21 NOTE — Progress Notes (Addendum)
Patient arrived for IV start per patient being difficult sick the IV team were called and we're awaiting their arrival.  Patient in room with daughter no s/s distress denies pain at this time.  Vitals charted.  Patient was a bit anxious and started to feel nauseous at the site of the needle.  Assisted to the bed to lay down and given a cool cloth while IV was placed.  Will monitor. ?

## 2021-08-21 NOTE — Progress Notes (Signed)
?  Radiation Oncology         [phone] ?________________________________ ? ?Name: METHA KOLASA MRN: [id number]  ?Date: 08/21/2021  DOB: 1966-01-19 ? ?SIMULATION AND TREATMENT PLANNING NOTE ? ?  ICD-10-CM   ?1. Brain metastases ([place])  C79.31   ?  ? ? ?DIAGNOSIS:  56 yo woman with two brain metastases from left upper lung cancer ? ?NARRATIVE:  The patient was brought to the [facility].  Identity was confirmed.  All relevant records and images related to the planned course of therapy were reviewed.  The patient freely provided informed written consent to proceed with treatment after reviewing the details related to the planned course of therapy. The consent form was witnessed and verified by the simulation staff. Intravenous access was established for contrast administration. Then, the patient was set-up in a stable reproducible supine position for radiation therapy.  A relocatable thermoplastic stereotactic head frame was fabricated for precise immobilization.  CT images were obtained.  Surface markings were placed.  The CT images were loaded into the planning software and fused with the patient's targeting MRI scan.  Then the target and avoidance structures were contoured.  Treatment planning then occurred.  The radiation prescription was entered and confirmed.  I have requested 3D planning  I have requested a DVH of the following structures: Brain stem, brain, left eye, right eye, lenses, optic chiasm, target volumes, uninvolved brain, and normal tissue.   ? ?SPECIAL TREATMENT PROCEDURE:  The planned course of therapy using radiation constitutes a special treatment procedure. Special care is required in the management of this patient for the following reasons. This treatment constitutes a Special Treatment Procedure for the following reason: High dose per fraction requiring special monitoring for increased toxicities of treatment including daily imaging.  The special nature of the planned  course of radiotherapy will require increased physician supervision and oversight to ensure patient's safety with optimal treatment outcomes.  This requires extended time and effort. ? ?PLAN:  The patient will receive 20 Gy in 1 fraction to each of these two targets seen on recent MRI: ?1. 3 mm enhancing lesion in the left frontal lobe along the superior frontal sulcus with minimal edema (series 1100, image 226). ?2. 4 mm enhancing lesion in the left superior frontal gyrus with mild edema (series 1100, image 220). ?________________________________ ? ?Sheral Apley Tammi Klippel, M.D. ? ?

## 2021-08-21 NOTE — Addendum Note (Signed)
Encounter addended by: Tyler Pita, MD on: 08/21/2021 1:10 PM ? Actions taken: Medication List reviewed, Problem List reviewed, Allergies reviewed, Clinical Note Signed

## 2021-08-22 ENCOUNTER — Ambulatory Visit: Payer: BC Managed Care – PPO | Attending: Hematology | Admitting: Physical Therapy

## 2021-08-22 ENCOUNTER — Encounter: Payer: Self-pay | Admitting: Physical Therapy

## 2021-08-22 DIAGNOSIS — M6281 Muscle weakness (generalized): Secondary | ICD-10-CM | POA: Insufficient documentation

## 2021-08-22 DIAGNOSIS — C3412 Malignant neoplasm of upper lobe, left bronchus or lung: Secondary | ICD-10-CM | POA: Diagnosis not present

## 2021-08-22 DIAGNOSIS — C7931 Secondary malignant neoplasm of brain: Secondary | ICD-10-CM | POA: Diagnosis present

## 2021-08-22 NOTE — Therapy (Signed)
[place] [facility] ?[address] ?[place], [place], [address] ?Phone: [phone]   Fax:  [phone] ? ?Physical Therapy Treatment ? ?Patient Details  ?Name: Abigail Golden ?MRN: [id number] ?Date of Birth: 1965-11-28 ?Referring Provider (PT): Dr. Burr Medico ? ? ?Encounter Date: 08/22/2021 ? ? PT End of Session - 08/22/21 1306   ? ? Visit Number 17   ? Number of Visits 17   ? Date for PT Re-Evaluation 09/06/21   ? PT Start Time 1302   ? PT Stop Time 5110   ? PT Time Calculation (min) 45 min   ? Activity Tolerance Patient tolerated treatment well   ? Behavior During Therapy [facility] for tasks assessed/performed   ? ?  ?  ? ?  ? ? ?Past Medical History:  ?Diagnosis Date  ? Allergy   ? Anxiety   ? Chronic bronchitis ([place])   ? Depression   ? Headache   ? Hypoglycemia   ? Lower back pain   ? since fall ~ 2012 (05/25/2015)  ? Migraine   ? 05/25/2015 "probably once/month"  ? Migraine headache   ? Pain in left hip   ? since fall ~ 2012 (05/25/2015)  ? Pneumonia "several times"  ? ? ?Past Surgical History:  ?Procedure Laterality Date  ? ABDOMINAL HYSTERECTOMY  11/2002  ? "found benign tumors"  ? BRONCHIAL BIOPSY  04/24/2021  ? Procedure: BRONCHIAL BIOPSIES;  Surgeon: Garner Nash, DO;  Location: [place] [facility];  Service: Pulmonary;;  ? BRONCHIAL BRUSHINGS  04/24/2021  ? Procedure: BRONCHIAL BRUSHINGS;  Surgeon: Garner Nash, DO;  Location: [place] [facility]OSCOPY;  Service: Pulmonary;;  ? BRONCHIAL NEEDLE ASPIRATION BIOPSY  04/24/2021  ? Procedure: BRONCHIAL NEEDLE ASPIRATION BIOPSIES;  Surgeon: Garner Nash, DO;  Location: [place] [facility];  Service: Pulmonary;;  ? CHEST TUBE INSERTION  04/24/2021  ? Procedure: CHEST TUBE INSERTION;  Surgeon: Garner Nash, DO;  Location: [place] [facility]COPY;  Service: Pulmonary;;  ? CHOLECYSTECTOMY N/A 05/25/2015  ? Procedure: LAPAROSCOPIC CHOLECYSTECTOMY;  Surgeon: Ralene Ok, MD;  Location: [place];  Service: General;  Laterality: N/A;  ? LAPAROSCOPIC CHOLECYSTECTOMY  05/25/2015   ? MENISCUS REPAIR Right 03/2017  ? VIDEO BRONCHOSCOPY WITH ENDOBRONCHIAL NAVIGATION N/A 04/24/2021  ? Procedure: VIDEO BRONCHOSCOPY WITH ENDOBRONCHIAL NAVIGATION;  Surgeon: Garner Nash, DO;  Location: [place];  Service: Pulmonary;  Laterality: N/A;  ? VIDEO BRONCHOSCOPY WITH RADIAL ENDOBRONCHIAL ULTRASOUND  04/24/2021  ? Procedure: VIDEO BRONCHOSCOPY WITH RADIAL ENDOBRONCHIAL ULTRASOUND;  Surgeon: Garner Nash, DO;  Location: [place] [facility];  Service: Pulmonary;;  ? ? ?There were no vitals filed for this visit. ? ? Subjective Assessment - 08/22/21 1305   ? ? Subjective Reports that she is hoarse after getting choked on a french fry.   ? Pertinent History Stage IV NSCLC with brain mets. Radiation to the brain 3 treatments. chemo pill every day. Hx of knee debridement Rt   ? Limitations House hold activities;Walking;Sitting   ? How long can you walk comfortably? 15mn   ? Patient Stated Goals can I build some strength and maybe return to work   ? Currently in Pain? No/denies   ? ?  ?  ? ?  ? ? ? ? ? [facility] PT Assessment - 08/22/21 0001   ? ?  ? Assessment  ? Medical Diagnosis non small cell lung cancer with mets to brain   ? Referring Provider (PT) Dr. FBurr Medico  ? Onset Date/Surgical Date 05/23/21   ? Hand Dominance Right   ?  Next MD Visit 08/31/2021   ? Prior Therapy yes at [facility] for the knee   ?  ? Precautions  ? Precaution Comments cancer mets, falls   ? ?  ?  ? ?  ? ? ? ? ? ? ? ? ? ? ? ? ? ? ? ? [place] Adult PT Treatment/Exercise - 08/22/21 0001   ? ?  ? Knee/Hip Exercises: Aerobic  ? Nustep L3 x19 min   ?  ? Knee/Hip Exercises: Standing  ? Heel Raises Both;20 reps   ? Heel Raises Limitations B toe raise x20 reps   ? Hip Flexion AROM;Both;15 reps;Knee bent   ? Hip Abduction AROM;Both;2 sets;10 reps;Knee straight   ?  ? Knee/Hip Exercises: Seated  ? Long [employer] Strengthening;Both;20 reps;Weights   ? Long Arc Quad Weight 4 lbs.   ? Hamstring Curl Strengthening;Both;20 reps   ? Hamstring Limitations blue  therababd   ? Sit to [facility] with UE support   8 reps; 38 sec  ? ?  ?  ? ?  ? ? ? ? ? ? ? ? ? ? ? ? ? ? ? PT Long Term Goals - 07/29/21 1343   ? ?  ? PT LONG TERM GOAL #1  ? Title Pt will be independent in her HEP and progression to work through cancer related fatigue   ? Time 8   ? Period Weeks   ? Status Achieved   ? Target Date 08/15/21   ?  ? PT LONG TERM GOAL #2  ? Title Pt will be able to perform sit to stand x 8 in 30 seconds to demonstrate improved mobility and function   ? Time 8   ? Period Weeks   ? Status On-going   ? Target Date 08/15/21   ?  ? PT LONG TERM GOAL #3  ? Title Pt will be able to return to home independently with modifications as needed safely   ? Time 8   ? Period Weeks   ? Status On-going   ? Target Date 08/15/21   ? ?  ?  ? ?  ? ? ? ? ? ? ? ? Plan - 08/22/21 1443   ? ? Clinical Impression Statement Patient presented in clinic with reports of hoarseness after choking last night. Patient has new cancer POC for brain mets that starts next week but no medication changes at this time. Patient completed all activity at more slow and controlled pace today. Limited goal achieval at this time as patient still lives with her caregiver and completed 8 sit <> stands in 38 seconds. Patient is compliant with HEP per patient report.   ? Personal Factors and Comorbidities Comorbidity 3+   ? Comorbidities met lung cancer, brain mets, radiation hx   ? Examination-Activity Limitations Bathing;Lift;Stand;Locomotion Level;Bend;Stairs   ? Examination-Participation Restrictions Cleaning;Meal Prep;Yard Work;Community Activity;Occupation;Driving   ? Stability/Clinical Decision Making Evolving/Moderate complexity   ? Rehab Potential Good   ? PT Frequency 2x / week   ? PT Duration 8 weeks   ? PT Treatment/Interventions ADLs/Self Care Home Management;Therapeutic exercise;Patient/family education;Gait training;Neuromuscular re-education;Balance training;DME Instruction;Stair training   ? PT Next Visit Plan start general  UE/LE therex - low level to start, assess balance as needed   ? Consulted and Agree with Plan of Care Patient   ? ?  ?  ? ?  ? ? ?Patient will benefit from skilled therapeutic intervention in order to improve the following deficits and impairments:  Difficulty walking,  Decreased strength ? ?Visit Diagnosis: ?Primary adenocarcinoma of upper lobe of left lung ([place]) ? ?Brain metastases ([place]) ? ?Muscle weakness (generalized) ? ? ? ? ?Problem List ?Patient Active Problem List  ? Diagnosis Date Noted  ? Primary adenocarcinoma of upper lobe of left lung ([place]) 05/03/2021  ? Lung mass 04/24/2021  ? Brain metastases ([place]) 04/23/2021  ? Chronic intractable headache 09/09/2018  ? Chronic migraine without aura without status migrainosus, not intractable 09/09/2018  ? Morbid obesity ([place]) 09/09/2018  ? Medication overuse headache 09/09/2018  ? Cholecystitis 05/25/2015  ? ? ?Standley Brooking, PTA ?08/22/2021, 3:02 PM ? ?[place] [facility] ?[address] ?[place], [place], [address] ?Phone: [phone]   Fax:  [phone] ? ?Name: Abigail Golden ?MRN: [id number] ?Date of Birth: 12-06-1965 ? ? ? ?

## 2021-08-22 NOTE — Addendum Note (Signed)
Addended by: Darlin Coco on: 08/22/2021 03:44 PM ? ? Modules accepted: Orders ? ?

## 2021-08-23 DIAGNOSIS — C3412 Malignant neoplasm of upper lobe, left bronchus or lung: Secondary | ICD-10-CM | POA: Diagnosis not present

## 2021-08-26 NOTE — Progress Notes (Signed)
?  Radiation Oncology         [phone] ?________________________________ ? ?Stereotactic Treatment Procedure Note ? ?Name: JAWANNA DYKMAN MRN: [id number]  ?Date: 08/27/2021  DOB: [birth year] ? ?SPECIAL TREATMENT PROCEDURE ? ?  ICD-10-CM   ?1. Brain metastases ([place])  C79.31   ?  ? ? ?3D TREATMENT PLANNING AND DOSIMETRY:  The patient's radiation plan was reviewed and approved by neurosurgery and radiation oncology prior to treatment.  It showed 3-dimensional radiation distributions overlaid onto the planning CT/MRI image set.  The [facility] for the target structures as well as the organs at risk were reviewed. The documentation of the 3D plan and dosimetry are filed in the radiation oncology EMR. ? ?NARRATIVE:  DEMYAH SMYRE was brought to the TrueBeam stereotactic radiation treatment machine and placed supine on the CT couch. The head frame was applied, and the patient was set up for stereotactic radiosurgery.  Neurosurgery was present for the set-up and delivery ? ?SIMULATION VERIFICATION:  In the couch zero-angle position, the patient underwent Exactrac imaging using the Brainlab system with orthogonal KV images.  These were carefully aligned and repeated to confirm treatment position for each of the isocenters.  The Exactrac snap film verification was repeated at each couch angle. ? ?PROCEDURE: [name] received stereotactic radiosurgery to the following targets: ?PTV7-Left Frontal 4 mm and PTV8-Left Frontal 3 mm targets were treated using a single isocenter using 7 Dynamic Conformal Arcs to a prescription dose of 20 Gy.  ExacTrac registration was performed for each couch angle.  The 100% isodose line was prescribed.  6 MV X-rays were delivered in the flattening filter free beam mode. ? ?STEREOTACTIC TREATMENT MANAGEMENT:  Following delivery, the patient was transported to nursing in stable condition and monitored for possible acute effects.  Vital signs were recorded BP 132/75 (BP Location: Right Arm, Patient  Position: Sitting, Cuff Size: Large)   Pulse 85   Temp 97.6 ?F (36.4 ?C)   Resp 20   SpO2 98% . The patient tolerated treatment without significant acute effects, and was discharged to home in stable condition.   ? ?PLAN: Follow-up in one month. ? ?________________________________ ? ?Sheral Apley Tammi Klippel, M.D. ? ? ?

## 2021-08-27 ENCOUNTER — Ambulatory Visit
Admission: RE | Admit: 2021-08-27 | Discharge: 2021-08-27 | Disposition: A | Discharge status: Home / Self Care | Payer: BC Managed Care – PPO | Source: Ambulatory Visit | Attending: Radiation Oncology | Admitting: Radiation Oncology

## 2021-08-27 ENCOUNTER — Encounter: Payer: Self-pay | Admitting: Radiation Oncology

## 2021-08-27 ENCOUNTER — Other Ambulatory Visit: Payer: Self-pay

## 2021-08-27 VITALS — BP 132/75 | HR 85 | Temp 97.6°F | Resp 20

## 2021-08-27 DIAGNOSIS — C7931 Secondary malignant neoplasm of brain: Secondary | ICD-10-CM

## 2021-08-27 DIAGNOSIS — C3412 Malignant neoplasm of upper lobe, left bronchus or lung: Secondary | ICD-10-CM | POA: Diagnosis not present

## 2021-08-27 NOTE — Progress Notes (Addendum)
Patient in for [facility] Brain over to nursing for 30 minutes observation.  Reports having diarrhea earlier and took Imodium which was effective at this time.  Mild abdominal pain 2/10 stated she feels ok doesn't want to see doctor.  Denies headache, nausea, blurred vision, gait is stable able to walk out of clinic without difficulty.  Daughter present at visit.   ?

## 2021-08-28 ENCOUNTER — Ambulatory Visit (HOSPITAL_COMMUNITY)
Admission: RE | Admit: 2021-08-28 | Discharge: 2021-08-28 | Disposition: A | Discharge status: Home / Self Care | Payer: BC Managed Care – PPO | Source: Ambulatory Visit | Attending: Hematology | Admitting: Hematology

## 2021-08-28 DIAGNOSIS — C3412 Malignant neoplasm of upper lobe, left bronchus or lung: Secondary | ICD-10-CM | POA: Insufficient documentation

## 2021-08-28 LAB — GLUCOSE, CAPILLARY: Glucose-Capillary: 126 mg/dL — ABNORMAL HIGH (ref 70–99)

## 2021-08-28 MED ORDER — FLUDEOXYGLUCOSE F - 18 (FDG) INJECTION
14.5100 | Freq: Once | INTRAVENOUS | Status: AC | PRN
  Administered 2021-08-28: 14.51 via INTRAVENOUS

## 2021-08-29 ENCOUNTER — Telehealth: Payer: Self-pay

## 2021-08-29 ENCOUNTER — Other Ambulatory Visit: Payer: Self-pay

## 2021-08-29 DIAGNOSIS — E860 Dehydration: Secondary | ICD-10-CM

## 2021-08-29 DIAGNOSIS — T451X5A Adverse effect of antineoplastic and immunosuppressive drugs, initial encounter: Secondary | ICD-10-CM | POA: Insufficient documentation

## 2021-08-29 NOTE — Telephone Encounter (Signed)
Pt's daughter called stating the pt is c/o of abdominal cramping since Monday 08/26/2021.  Pt is real fatigue and nauseous.  Pt's daughter stated that today the pt has diarrhea x10 episodes and have been vomiting x4 episodes.  Pt's daughter stated her mom is taking Zofran and Imodium AD which does not seem to be helping.  Pt is just drinking some water but not a lot.  Instructed pt's daughter to also stagger dose the Zofran and Compazine to help with the nausea.  Also instructed to drink Liquid IV, Pedialyte, Gatorade, and to incorporate some ginger into the pt's diet to help ease the nausea.  Pt's daughter verbalized understanding of instructions.  Informed pt's daughter that the pt maybe required to come into infusion for rehydration, IV nausea medication, and labs.  Pt's daughter stated they are available to come at anytime.  This RN will check with infusion to see if the pt can be added on today or tomorrow.  Notified Dr. Burr Medico of the pt's recent complaints.   ?

## 2021-08-30 ENCOUNTER — Telehealth: Payer: Self-pay

## 2021-08-30 ENCOUNTER — Ambulatory Visit: Payer: BC Managed Care – PPO

## 2021-08-30 ENCOUNTER — Inpatient Hospital Stay: Payer: BC Managed Care – PPO | Attending: Hematology

## 2021-08-30 DIAGNOSIS — R21 Rash and other nonspecific skin eruption: Secondary | ICD-10-CM | POA: Insufficient documentation

## 2021-08-30 DIAGNOSIS — F419 Anxiety disorder, unspecified: Secondary | ICD-10-CM | POA: Insufficient documentation

## 2021-08-30 DIAGNOSIS — Z79899 Other long term (current) drug therapy: Secondary | ICD-10-CM | POA: Insufficient documentation

## 2021-08-30 DIAGNOSIS — G4700 Insomnia, unspecified: Secondary | ICD-10-CM | POA: Insufficient documentation

## 2021-08-30 DIAGNOSIS — R197 Diarrhea, unspecified: Secondary | ICD-10-CM | POA: Insufficient documentation

## 2021-08-30 DIAGNOSIS — Z7982 Long term (current) use of aspirin: Secondary | ICD-10-CM | POA: Insufficient documentation

## 2021-08-30 DIAGNOSIS — B372 Candidiasis of skin and nail: Secondary | ICD-10-CM | POA: Insufficient documentation

## 2021-08-30 DIAGNOSIS — C3412 Malignant neoplasm of upper lobe, left bronchus or lung: Secondary | ICD-10-CM | POA: Insufficient documentation

## 2021-08-30 DIAGNOSIS — C7931 Secondary malignant neoplasm of brain: Secondary | ICD-10-CM | POA: Insufficient documentation

## 2021-08-30 DIAGNOSIS — F32A Depression, unspecified: Secondary | ICD-10-CM | POA: Insufficient documentation

## 2021-08-30 DIAGNOSIS — R110 Nausea: Secondary | ICD-10-CM | POA: Insufficient documentation

## 2021-08-30 NOTE — Telephone Encounter (Signed)
This nurse spoke with Abigail Golden who stated that she was supposed to call and cancel the appointment on yesterday.  She states the patients nausea and diarrhea has subsided.  She has been doing much better since last evening.  Patient is now able to keep fluids down.  This nurse advised to continue to encourage fluids and if symptoms return that she can go to ED for evaluation. She acknowledged understanding.  No further questions or concerns at this time.   ?

## 2021-08-30 NOTE — Telephone Encounter (Signed)
This nurse attempted to reach patient related to missed appointment for fluids and antiemetics today.  No answer.  This nurse will attempt to reach patient again.   ?

## 2021-09-02 ENCOUNTER — Other Ambulatory Visit (HOSPITAL_COMMUNITY): Payer: Self-pay

## 2021-09-04 ENCOUNTER — Other Ambulatory Visit: Payer: Self-pay

## 2021-09-04 ENCOUNTER — Inpatient Hospital Stay: Payer: BC Managed Care – PPO

## 2021-09-04 ENCOUNTER — Encounter: Payer: Self-pay | Admitting: Hematology

## 2021-09-04 ENCOUNTER — Inpatient Hospital Stay: Payer: BC Managed Care – PPO | Admitting: Hematology

## 2021-09-04 VITALS — BP 119/63 | HR 84 | Temp 98.0°F | Resp 20 | Ht 60.0 in | Wt 285.6 lb

## 2021-09-04 DIAGNOSIS — C3412 Malignant neoplasm of upper lobe, left bronchus or lung: Secondary | ICD-10-CM

## 2021-09-04 DIAGNOSIS — G4700 Insomnia, unspecified: Secondary | ICD-10-CM | POA: Diagnosis not present

## 2021-09-04 DIAGNOSIS — R112 Nausea with vomiting, unspecified: Secondary | ICD-10-CM

## 2021-09-04 DIAGNOSIS — F32A Depression, unspecified: Secondary | ICD-10-CM | POA: Diagnosis not present

## 2021-09-04 DIAGNOSIS — R197 Diarrhea, unspecified: Secondary | ICD-10-CM | POA: Diagnosis not present

## 2021-09-04 DIAGNOSIS — C7931 Secondary malignant neoplasm of brain: Secondary | ICD-10-CM | POA: Diagnosis not present

## 2021-09-04 DIAGNOSIS — Z79899 Other long term (current) drug therapy: Secondary | ICD-10-CM | POA: Diagnosis not present

## 2021-09-04 DIAGNOSIS — R21 Rash and other nonspecific skin eruption: Secondary | ICD-10-CM | POA: Diagnosis not present

## 2021-09-04 DIAGNOSIS — Z7982 Long term (current) use of aspirin: Secondary | ICD-10-CM | POA: Diagnosis not present

## 2021-09-04 DIAGNOSIS — R110 Nausea: Secondary | ICD-10-CM | POA: Diagnosis not present

## 2021-09-04 DIAGNOSIS — B372 Candidiasis of skin and nail: Secondary | ICD-10-CM | POA: Diagnosis not present

## 2021-09-04 DIAGNOSIS — E860 Dehydration: Secondary | ICD-10-CM

## 2021-09-04 DIAGNOSIS — F419 Anxiety disorder, unspecified: Secondary | ICD-10-CM | POA: Diagnosis not present

## 2021-09-04 LAB — SAMPLE TO BLOOD BANK

## 2021-09-04 LAB — CBC WITH DIFFERENTIAL (CANCER CENTER ONLY)
Abs Immature Granulocytes: 0.01 10*3/uL (ref 0.00–0.07)
Basophils Absolute: 0 10*3/uL (ref 0.0–0.1)
Basophils Relative: 0 %
Eosinophils Absolute: 0 10*3/uL (ref 0.0–0.5)
Eosinophils Relative: 0 %
HCT: 36.6 % (ref 36.0–46.0)
Hemoglobin: 11.9 g/dL — ABNORMAL LOW (ref 12.0–15.0)
Immature Granulocytes: 0 %
Lymphocytes Relative: 42 %
Lymphs Abs: 1.6 10*3/uL (ref 0.7–4.0)
MCH: 26.9 pg (ref 26.0–34.0)
MCHC: 32.5 g/dL (ref 30.0–36.0)
MCV: 82.6 fL (ref 80.0–100.0)
Monocytes Absolute: 0.4 10*3/uL (ref 0.1–1.0)
Monocytes Relative: 9 %
Neutro Abs: 1.8 10*3/uL (ref 1.7–7.7)
Neutrophils Relative %: 49 %
Platelet Count: 178 10*3/uL (ref 150–400)
RBC: 4.43 MIL/uL (ref 3.87–5.11)
RDW: 12.8 % (ref 11.5–15.5)
WBC Count: 3.7 10*3/uL — ABNORMAL LOW (ref 4.0–10.5)
nRBC: 0 % (ref 0.0–0.2)

## 2021-09-04 LAB — CMP (CANCER CENTER ONLY)
ALT: 16 U/L (ref 0–44)
AST: 20 U/L (ref 15–41)
Albumin: 4.2 g/dL (ref 3.5–5.0)
Alkaline Phosphatase: 70 U/L (ref 38–126)
Anion gap: 6 (ref 5–15)
BUN: 13 mg/dL (ref 6–20)
CO2: 28 mmol/L (ref 22–32)
Calcium: 9.7 mg/dL (ref 8.9–10.3)
Chloride: 105 mmol/L (ref 98–111)
Creatinine: 1.05 mg/dL — ABNORMAL HIGH (ref 0.44–1.00)
GFR, Estimated: >60 mL/min (ref >60)
Glucose, Bld: 112 mg/dL — ABNORMAL HIGH (ref 70–99)
Potassium: 4 mmol/L (ref 3.5–5.1)
Sodium: 139 mmol/L (ref 135–145)
Total Bilirubin: 0.3 mg/dL (ref 0.3–1.2)
Total Protein: 7.6 g/dL (ref 6.5–8.1)

## 2021-09-04 MED ORDER — ZOLPIDEM TARTRATE 10 MG PO TABS: 10.0000 mg | ORAL_TABLET | Freq: Every evening | ORAL | 0 refills | Status: DC | PRN

## 2021-09-04 NOTE — Progress Notes (Signed)
?[facility]   ?[phone] [phone]   ?Clinic Follow up Note  ? ?Patient Care Team: ?Amie Critchley as PCP - General (Family Medicine) ?Valrie Hart, RN as Oncology Nurse Navigator (Oncology) ? ?Date of Service:  09/04/2021 ? ?CHIEF COMPLAINT: f/u of metastatic lung cancer ? ?CURRENT THERAPY:  ?Tagrisso, 80 mg daily, started 05/22/21 ? ?ASSESSMENT & PLAN:  ?Abigail Golden is a 56 y.o. female with  ? ?1. 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 Brain MRI showed multifocal metastatic disease. ?-staging CT CAP 04/23/21 showed: 4.5 cm LUL mass that abuts pleural surface; no pathologically enlarged lymph nodes or metastatic disease. ?-bronchoscopy on 04/24/21 under Dr. Valeta Harms showed malignant cells consistent with adenocarcinoma, insufficient tissue for molecular studies. Guardant 360 was obtained and showed EGFR L858R mutation, which predicts good response to EGFR inhibitor. ?-PET scan 05/13/21 showed LUL mass extending along superior mediastinal border from hilum towards apex.  No lymph node or other distant metastasis. ?-she started Tagrisso on 05/22/21. She tolerates moderately well with mucositis and skin toxicities.  ?-restaging CT CAP on 08/05/21 showing: no substantial change in LUL lesion; slightly enlarging  adjacent prevascular/AP window lymph nodes; development of upper normal bilateral common iliac and left external iliac lymph nodes; no evidence for metastatic disease in abdomen/pelvis. ?-PET scan on 08/28/21 showed: similar size and metabolic activity to primary left lung mass; new hypermetabolic ipsilateral nodal metastasis to prevascular nodal space, left lower paratracheal nodal station, and left supraclavicular. I reviewed the results and images with them today. ?-we  discussed treatment options today. We discussed adding thoracic radiation to the tagrisso due to her limited disease progression, or changing to chemotherapy with carbo, Alimta and Beva or pembro every 3 weeks. They will take more time to consider their options. For now, she will continue tagrisso while making a decision. ?-I spoke with Dr. Tammi Klippel today and he agrees with thoracic radiation  ?  ?2. Symptom Management: Rash, Nausea, Diarrhea, Insomnia ?-secondary to Tagrisso ?-she no longer needs the magic mouthwash or pain medication. ?-she uses nystatin powder for her groin/abdomen yeast infection. ?-intermittent diarrhea managed with imodium (worsened after CT contrast). ?-she reports issues sleeping. I will call in [place] for her. ?  ?3. Brain Metastases ?-brain MRI 04/23/21 showed 2 brain masses with moderate vasogenic edema left parietal lobe and left inferior frontal gyrus.  There were 2 other small metastases in the right frontal gyrus and right posterior temporal lobe and questionable 2 additional punctate metastases in the superior frontal gyrus. ?-she received three fractions of SRS under Dr. Tammi Klippel 11/16-11/21/22. ?-surveillance brain MRI on 08/16/21 revealed two new left frontal lobe metastases measuring 3-4 mm and decreased size or resolution of treated metastases. ?-she received one fraction of SRS to the new metastases on 08/27/21. ?  ?4. 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 [name] is now 57 and the patient lives with her and she helps the patient due to her recent diagnosis. 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  ?-she is in touch with her father, [name], but [name] describes him as being unable to fully comprehend her situation. ?-The patient has strong social support at church and has several people that can drive her to appointments. ?-Discussed with the patient that we have a Education officer, museum onsite  if she ever has any concerns with  transportation ?-she is currently on disability ?  ?5. Goal of care discussion  ?-We again discussed the incurable nature of her cancer, and the overall poor prognosis, especially if she does not have good response to chemotherapy or progress on chemo ?-The patient understands the goal of care is palliative. ?-I recommend DNR/DNI, she has already filled out a living will and has named [name] her HCPOA. She verbally agrees again today. ?  ?  ?Plan:  ?-PET scan reviewed, cancer progression in thoracic nodes ?-continue Tagrisso 4m daily ?-I recommended adding chest radiation or change Tagrisso to chemo, she will think about it and call me back with her decision  ?-[place] ?-f/u pending treatment decision ? ? ?No problem-specific Assessment & Plan notes found for this encounter. ? ? ?SUMMARY OF ONCOLOGIC HISTORY: ?Oncology History Overview Note  ? Cancer Staging  ?Primary adenocarcinoma of upper lobe of left lung ([facility] ?Staging form: Lung, AJCC 8th Edition ?- Clinical stage from 04/24/2021: Stage IVA (cT2, cN2, pM1b) - Signed by FTruitt Merle MD on 05/14/2021 ? ?  ?Primary adenocarcinoma of [place] lobe of left lung ([facility]  ?04/22/2021 Imaging  ? CT HEAD WO CONTRAST  ? ?IMPRESSION: ?Two separate areas of vasogenic edema within the left hemisphere, 1 within the inferior frontal region in the other at the left parietal vertex. Metastatic disease would be the most likely cause of this appearance. Other possibilities include venous infarctions and cerebritis. MRI with contrast recommended. ? ?  ?04/23/2021 Imaging  ? MR Brain W and Wo Contrast  ?IMPRESSION: ?1. Metastatic disease to the brain. ?Two enhancing brain masses with moderate associated vasogenic edema: solid 16 mm left parietal lobe mass, and a larger 29 mm rim enhancing cystic or necrotic mass in the left inferior frontal ?gyrus. Two other small 2-3 mm metastases in the right inferior frontal gyrus, right posterior temporal lobe. And questionable two  additional punctate metastases in both superior frontal gyri abutting the falx (series 18, image 46). ?2. No significant intracranial mass effect. No other intracranial ?abnormality. ?  ?04/23/2021 Imaging  ? CT Chest W Contrast  ?IMPRESSION: ?4.5 cm spiculated left upper lobe mass most compatible with primary lung cancer. This abuts the medial pleural surface. A few small adjacent pre-vascular mediastinal lymph nodes, none pathologically enlarged. Recommend cardiothoracic surgical and oncologic consultation. ?  ?No acute findings or evidence of metastatic disease in the abdomen or pelvis. ?  ?04/24/2021 Cancer Staging  ? Staging form: Lung, AJCC 8th Edition ?- Clinical stage from 04/24/2021: Stage IVA (cT2, cN2, pM1b) - Signed by FTruitt Merle MD on 05/14/2021 ? ?  ?04/24/2021 Initial Biopsy  ? FINAL MICROSCOPIC DIAGNOSIS:  ? ?A. LUNG, LUL, FINE NEEDLE ASPIRATION:  ?- Malignant cells present, consistent with adenocarcinoma  ? ?B. LUNG, LUL, BRUSHING:  ?- Malignant cells consistent with adenocarcinoma, see comment  ? ? ?COMMENT:  ?B.  Immunohistochemical stains show that the tumor cells are positive for TTF-1 while they are negative for p63 and CK5/6, consistent with above interpretation. The number of tumor cells appears to be insufficient for molecular studies. ?  ?05/03/2021 Initial Diagnosis  ? [place] cancer ([facility] ?  ?05/08/2021 - 05/13/2021 Radiation Therapy  ? SRS treatment to the metastatic brain lesions under the care of Dr. MTammi Klippel ?  ?08/05/2021 Imaging  ? EXAM: ?CT CHEST, ABDOMEN, AND PELVIS WITH CONTRAST ? ?IMPRESSION: ?1. No substantial change in size of the left upper lobe pulmonary ?lesion. ?  2. Borderline to mildly enlarged adjacent prevascular/AP window ?lymph nodes have increased in size in the interval. This finding ?raises concern for disease progression. ?3. Interval development of upper normal bilateral common iliac and ?left external iliac lymph nodes. Close attention on follow-up ?recommended. ?4. No  other evidence for metastatic disease in the abdomen or ?pelvis. ?  ? ? ? ?INTERVAL HISTORY:  ?Abigail Golden is here for a follow up of metastatic lung cancer. She was last seen by me on 08/07/21. She presents to the clinic acco

## 2021-09-08 ENCOUNTER — Emergency Department (HOSPITAL_COMMUNITY): Payer: BC Managed Care – PPO

## 2021-09-08 ENCOUNTER — Other Ambulatory Visit: Payer: Self-pay

## 2021-09-08 ENCOUNTER — Emergency Department (HOSPITAL_COMMUNITY)
Admission: EM | Admit: 2021-09-08 | Discharge: 2021-09-08 | Disposition: A | Discharge status: Home / Self Care | Payer: BC Managed Care – PPO | Attending: Emergency Medicine | Admitting: Emergency Medicine

## 2021-09-08 ENCOUNTER — Encounter (HOSPITAL_COMMUNITY): Payer: Self-pay | Admitting: Emergency Medicine

## 2021-09-08 DIAGNOSIS — Z7982 Long term (current) use of aspirin: Secondary | ICD-10-CM | POA: Diagnosis not present

## 2021-09-08 DIAGNOSIS — K529 Noninfective gastroenteritis and colitis, unspecified: Secondary | ICD-10-CM | POA: Insufficient documentation

## 2021-09-08 DIAGNOSIS — D649 Anemia, unspecified: Secondary | ICD-10-CM | POA: Diagnosis not present

## 2021-09-08 DIAGNOSIS — R739 Hyperglycemia, unspecified: Secondary | ICD-10-CM | POA: Diagnosis not present

## 2021-09-08 DIAGNOSIS — R197 Diarrhea, unspecified: Secondary | ICD-10-CM | POA: Diagnosis present

## 2021-09-08 LAB — CBC WITH DIFFERENTIAL/PLATELET
Abs Immature Granulocytes: 0.01 10*3/uL (ref 0.00–0.07)
Basophils Absolute: 0 10*3/uL (ref 0.0–0.1)
Basophils Relative: 0 %
Eosinophils Absolute: 0 10*3/uL (ref 0.0–0.5)
Eosinophils Relative: 0 %
HCT: 36.5 % (ref 36.0–46.0)
Hemoglobin: 11.7 g/dL — ABNORMAL LOW (ref 12.0–15.0)
Immature Granulocytes: 0 %
Lymphocytes Relative: 34 %
Lymphs Abs: 1.1 10*3/uL (ref 0.7–4.0)
MCH: 27.2 pg (ref 26.0–34.0)
MCHC: 32.1 g/dL (ref 30.0–36.0)
MCV: 84.9 fL (ref 80.0–100.0)
Monocytes Absolute: 0.4 10*3/uL (ref 0.1–1.0)
Monocytes Relative: 11 %
Neutro Abs: 1.7 10*3/uL (ref 1.7–7.7)
Neutrophils Relative %: 55 %
Platelets: 160 10*3/uL (ref 150–400)
RBC: 4.3 MIL/uL (ref 3.87–5.11)
RDW: 12.8 % (ref 11.5–15.5)
WBC: 3.2 10*3/uL — ABNORMAL LOW (ref 4.0–10.5)
nRBC: 0 % (ref 0.0–0.2)

## 2021-09-08 LAB — URINALYSIS, ROUTINE W REFLEX MICROSCOPIC
Glucose, UA: NEGATIVE mg/dL
Hgb urine dipstick: NEGATIVE
Ketones, ur: NEGATIVE mg/dL
Leukocytes,Ua: NEGATIVE
Nitrite: NEGATIVE
Protein, ur: NEGATIVE mg/dL
Specific Gravity, Urine: >1.03 — ABNORMAL HIGH (ref 1.005–1.030)
pH: 5.5 (ref 5.0–8.0)

## 2021-09-08 LAB — COMPREHENSIVE METABOLIC PANEL
ALT: 21 U/L (ref 0–44)
AST: 23 U/L (ref 15–41)
Albumin: 4.1 g/dL (ref 3.5–5.0)
Alkaline Phosphatase: 60 U/L (ref 38–126)
Anion gap: 9 (ref 5–15)
BUN: 11 mg/dL (ref 6–20)
CO2: 23 mmol/L (ref 22–32)
Calcium: 9.4 mg/dL (ref 8.9–10.3)
Chloride: 106 mmol/L (ref 98–111)
Creatinine, Ser: 0.99 mg/dL (ref 0.44–1.00)
GFR, Estimated: >60 mL/min (ref >60)
Glucose, Bld: 131 mg/dL — ABNORMAL HIGH (ref 70–99)
Potassium: 3.8 mmol/L (ref 3.5–5.1)
Sodium: 138 mmol/L (ref 135–145)
Total Bilirubin: 0.4 mg/dL (ref 0.3–1.2)
Total Protein: 7.6 g/dL (ref 6.5–8.1)

## 2021-09-08 LAB — LIPASE, BLOOD: Lipase: 25 U/L (ref 11–51)

## 2021-09-08 MED ORDER — SODIUM CHLORIDE 0.9 % IV BOLUS
1000.0000 mL | Freq: Once | INTRAVENOUS | Status: AC
  Administered 2021-09-08: 1000 mL via INTRAVENOUS

## 2021-09-08 MED ORDER — ONDANSETRON 4 MG PO TBDP
4.0000 mg | ORAL_TABLET | Freq: Once | ORAL | Status: AC
  Administered 2021-09-08: 4 mg via ORAL
  Filled 2021-09-08: qty 1

## 2021-09-08 MED ORDER — HYDROCODONE-ACETAMINOPHEN 5-325 MG PO TABS: 1.0000 | ORAL_TABLET | Freq: Four times a day (QID) | ORAL | 0 refills | Status: DC | PRN

## 2021-09-08 MED ORDER — HYDROMORPHONE HCL 1 MG/ML IJ SOLN
1.0000 mg | Freq: Once | INTRAMUSCULAR | Status: AC
  Administered 2021-09-08: 1 mg via INTRAMUSCULAR
  Filled 2021-09-08: qty 1

## 2021-09-08 NOTE — ED Provider Notes (Signed)
?[facility] DEPT ?Provider Note ? ? ?CSN: [id number] ?Arrival date & time: 09/08/21  1803 ? ?  ? ?History ? ?Chief Complaint  ?Patient presents with  ? Emesis  ? ? ?Abigail Golden is a 56 y.o. female. ? ?Patient presents with vomiting and diarrhea.  Patient has a history of cancer that she is getting radiation ? ?The history is provided by the patient and medical records. No language interpreter was used.  ?Emesis ?Severity:  Moderate ?Timing:  Constant ?Quality:  Bilious material ?Able to tolerate:  Liquids ?Progression:  Unchanged ?Chronicity:  New ?Recent urination:  Normal ?Relieved by:  Nothing ?Worsened by:  Nothing ?Ineffective treatments:  None tried ?Associated symptoms: diarrhea   ?Associated symptoms: no abdominal pain, no cough and no headaches   ? ?  ? ?Home Medications ?Prior to Admission medications   ?Medication Sig Start Date End Date Taking? Authorizing Provider  ?HYDROcodone-acetaminophen (NORCO/VICODIN) 5-325 MG tablet Take 1 tablet by mouth every 6 (six) hours as needed for moderate pain. 09/08/21  Yes Milton Ferguson, MD  ?acetaminophen (TYLENOL) 500 MG tablet Take 500 mg by mouth every 6 (six) hours as needed for moderate pain or headache.    [provider]  ?albuterol (VENTOLIN HFA) 108 (90 Base) MCG/ACT inhaler Inhale 2 puffs into the lungs every 4 (four) hours as needed. 06/21/19   [provider]  ?ALPRAZolam Duanne Moron) 0.25 MG tablet Take 0.25 mg by mouth every 4 (four) hours as needed for anxiety. for anxiety 07/16/18   [provider]  ?aspirin EC 81 MG tablet Take 81 mg by mouth daily. Swallow whole.    [provider]  ?citalopram (CELEXA) 10 MG tablet Take 10 mg by mouth at bedtime. 08/16/18   [provider]  ?clindamycin (CLEOCIN T) 1 % external solution Apply topically 2 (two) times daily. 07/10/21   Truitt Merle, MD  ?clindamycin-benzoyl peroxide [facility]) gel Apply to face 2 times a day 06/03/21   Truitt Merle, MD   ?diclofenac sodium (VOLTAREN) 1 % GEL Apply topically as needed. To affected area    [provider]  ?diphenhydrAMINE (BENADRYL) 25 MG tablet Take 25 mg by mouth See admin instructions. 25 mg at bedtime ?25 mg as needed for itching    [provider]  ?Docusate Sodium (STOOL SOFTENER LAXATIVE PO) Take 1 tablet by mouth as needed.    [provider]  ?eletriptan (RELPAX) 20 MG tablet Take 20 mg by mouth as needed for headache (at onset of migraine.). May repeat in 2 hours if needed.    [provider]  ?fluticasone (FLONASE) 50 MCG/ACT nasal spray Place 2 sprays into both nostrils daily as needed for allergies. 02/12/17   [provider]  ?hydrOXYzine (ATARAX/VISTARIL) 25 MG tablet Take 25 mg by mouth See admin instructions. 25 mg at bedtime ?25 mg during the day as needed for itching/anxiety    [provider]  ?loratadine (CLARITIN) 10 MG tablet Take 10 mg by mouth daily.    [provider]  ?magic mouthwash (nystatin, lidocaine, diphenhydrAMINE, alum & mag hydroxide) suspension Swish and swallow 5 mls by mouth every 6 hours as needed 06/03/21   Truitt Merle, MD  ?magic mouthwash w/lidocaine SOLN Take 5 mLs by mouth every 6 hours as needed for mouth pain. Swish and Swallow ?Patient not taking: Reported on 08/19/2021 06/03/21   Truitt Merle, MD  ?montelukast (SINGULAIR) 10 MG tablet Take 10 mg by mouth at bedtime.    [provider]  ?nystatin (MYCOSTATIN/NYSTOP) powder Apply 1 application topically 3 (three) times daily. 08/07/21   Truitt Merle, MD  ?ondansetron (ZOFRAN-ODT) 4 MG disintegrating tablet Take 1 tablet (4 mg total) by mouth every 8 (eight) hours as needed for nausea or vomiting. 07/11/21   Truitt Merle, MD  ?osimertinib mesylate (TAGRISSO) 80 MG tablet Take 1 tablet (80 mg total) by mouth daily. 08/09/21   Truitt Merle, MD  ?predniSONE (DELTASONE) 5 MG tablet Take 1 tablet (5 mg total) by mouth daily with breakfast. Take 2 tabs daily for 7 days then  1 tab daily 07/10/21   Truitt Merle, MD  ?prochlorperazine (COMPAZINE) 10 MG tablet Take 1 tablet (10 mg total) by mouth every 6 (six) hours as needed for nausea or vomiting. 05/20/21   Truitt Merle, MD  ?solifenacin (VESICARE) 10 MG tablet Take 10 mg by mouth every evening.    [provider]  ?zolpidem (AMBIEN) 10 MG tablet Take 1 tablet (10 mg total) by mouth at bedtime as needed for sleep. 09/04/21 10/04/21  Truitt Merle, MD  ?   ? ?Allergies    ?Celebrex [celecoxib] and Minocycline   ? ?Review of Systems   ?Review of Systems  ?Constitutional:  Negative for appetite change and fatigue.  ?HENT:  Negative for congestion, ear discharge and sinus pressure.   ?Eyes:  Negative for discharge.  ?Respiratory:  Negative for cough.   ?Cardiovascular:  Negative for chest pain.  ?Gastrointestinal:  Positive for diarrhea and vomiting. Negative for abdominal pain.  ?Genitourinary:  Negative for frequency and hematuria.  ?Musculoskeletal:  Negative for back pain.  ?Skin:  Negative for rash.  ?Neurological:  Negative for seizures and headaches.  ?Psychiatric/Behavioral:  Negative for hallucinations.   ? ?Physical Exam ?Updated Vital Signs ?BP 136/72   Pulse 69   Temp 97.9 ?F (36.6 ?C) (Oral)   Resp 11   Ht 5' (1.524 m)   Wt 127.9 kg   SpO2 92%   BMI 55.07 kg/m?  ?Physical Exam ?Vitals and nursing note reviewed.  ?Constitutional:   ?   Appearance: She is well-developed.  ?HENT:  ?   Head: Normocephalic.  ?   Nose: Nose normal.  ?Eyes:  ?   General: No scleral icterus. ?   Conjunctiva/sclera: Conjunctivae normal.  ?Neck:  ?   Thyroid: No thyromegaly.  ?Cardiovascular:  ?   Rate and Rhythm: Normal rate and regular rhythm.  ?   Heart sounds: No murmur heard. ?  No friction rub. No gallop.  ?Pulmonary:  ?   Breath sounds: No stridor. No wheezing or rales.  ?Chest:  ?   Chest wall: No tenderness.  ?Abdominal:  ?   General: There is no distension.  ?   Tenderness: There is abdominal tenderness. There is no rebound.  ?   Comments:  Minimal tenderness left lower quadrant  ?Musculoskeletal:     ?   General: Normal range of motion.  ?   Cervical back: Neck supple.  ?Lymphadenopathy:  ?   Cervical: No cervical adenopathy.  ?Skin: ?   Findings: No erythema or rash.  ?Neurological:  ?   Mental Status: She is alert and oriented to person, place, and time.  ?   Motor: No abnormal muscle tone.  ?   Coordination: Coordination normal.  ?Psychiatric:     ?   Behavior: Behavior normal.  ? ? ?ED Results / Procedures / Treatments   ?Labs ?(all labs ordered are listed, but only abnormal results are displayed) ?Labs  Reviewed  ?COMPREHENSIVE METABOLIC PANEL - Abnormal; Notable for the following components:  ?    Result Value  ? Glucose, Bld 131 (*)   ? All other components within normal limits  ?CBC WITH DIFFERENTIAL/PLATELET - Abnormal; Notable for the following components:  ? WBC 3.2 (*)   ? Hemoglobin 11.7 (*)   ? All other components within normal limits  ?URINALYSIS, ROUTINE W REFLEX MICROSCOPIC - Abnormal; Notable for the following components:  ? Specific Gravity, Urine >1.030 (*)   ? Bilirubin Urine SMALL (*)   ? All other components within normal limits  ?LIPASE, BLOOD  ? ? ?EKG ?None ? ?Radiology ?CT ABDOMEN PELVIS WO CONTRAST ? ?Result Date: 09/08/2021 ?CLINICAL DATA:  Abdominal pain EXAM: CT ABDOMEN AND PELVIS WITHOUT CONTRAST TECHNIQUE: Multidetector CT imaging of the abdomen and pelvis was performed following the standard protocol without IV contrast. RADIATION DOSE REDUCTION: This exam was performed according to the departmental dose-optimization program which includes automated exposure control, adjustment of the mA and/or kV according to patient size and/or use of iterative reconstruction technique. COMPARISON:  CT chest abdomen pelvis, 08/05/2021, PET-CT, 08/28/2021 FINDINGS: Lower chest: No acute abnormality. Hepatobiliary: No focal liver abnormality is seen. Status post cholecystectomy. No biliary dilatation. Pancreas: Unremarkable. No  pancreatic ductal dilatation or surrounding inflammatory changes. Spleen: Splenomegaly, maximum coronal span 14.2 cm. Adrenals/Urinary Tract: Adrenal glands are unremarkable. Kidneys are normal, without renal calculi

## 2021-09-08 NOTE — ED Triage Notes (Signed)
Patient c/o N/V/D and lower abdominal cramping x2 weeks. Getting radiation to brain mets to lung. Oral chemo.  ?

## 2021-09-08 NOTE — ED Provider Triage Note (Addendum)
Emergency Medicine Provider Triage Evaluation Note ? ?Abigail Golden , a 56 y.o. female  was evaluated in triage. Currently treated for her brain cancer with oral chemo and radiation. Now mets to her lungs. Pt complains of nausea, vomiting, and lower abdominal cramping with some diarrhea for the past 2 weeks almost. She denies any melena or hematochezia. Denies any hematemesis or coffee ground emesis. She was seen by her cancer doctor on 09/04/21 and was told she probably had the stomach bug. Last night, she was having vomiting refractory to compazine and zofran and her cancer nurse advised she come to the ER. Denies any fevers. ? ?Review of Systems  ?Positive: Abdominal pain, nausea, vomiting, diarrhea ?Negative: See above ? ?Physical Exam  ?BP (!) 166/95 (BP Location: Left Arm)   Pulse 83   Temp 97.9 ?F (36.6 ?C) (Oral)   Resp 18   SpO2 95%  ?Gen:   Awake, no distress   ?Resp:  Normal effort  ?MSK:   Moves extremities without difficulty  ?Other:  Morbidly obese, exam limited. Abdomen is soft, suprapubic, periumbilical tenderness.  ? ?Medical Decision Making  ?Medically screening exam initiated at 6:16 PM.  Appropriate orders placed.  Abigail Golden was informed that the remainder of the evaluation will be completed by another provider, this initial triage assessment does not replace that evaluation, and the importance of remaining in the ED until their evaluation is complete. ? ?Labs and CT ordered. Recent labs on 09/04/21 shows a Cr of 1.05. Safe for CT.  ?  ?Sherrell Puller, PA-C ?09/08/21 1819 ? ?  ?Sherrell Puller, PA-C ?09/08/21 1821 ? ?

## 2021-09-08 NOTE — ED Notes (Signed)
Pt discharged. Instructions and prescription given. AAOX4. Pt in no apparent distress wild mild  pain. The opportunity to ask questions was provided.  ?

## 2021-09-08 NOTE — ED Notes (Signed)
Pt said she is a hard stick and rather have IV team get her blood so she is not stuck a lot. ?

## 2021-09-09 ENCOUNTER — Telehealth: Payer: Self-pay | Admitting: Hematology

## 2021-09-09 NOTE — Telephone Encounter (Signed)
Scheduled appointment per inbasket message. Patient aware.   ?

## 2021-09-10 ENCOUNTER — Telehealth: Payer: Self-pay | Admitting: Radiation Therapy

## 2021-09-10 NOTE — Telephone Encounter (Signed)
Received a call from [facility] reporting that she is still experiencing watery diarrhea. She is not in as much pain as she was over the weekend, but still not able to eat any solid food. The ED visit suggested she has a "stomach bug", so she is not sure if she needs to allow this to run it's course without any medical prevention, or if it is OK to take imodium or other antidiarrheal medication. She is requesting a call back with recommendations.  ? ?Mont Dutton R.T.(R)(T) ?Radiation Special Procedures Navigator  ?

## 2021-09-10 NOTE — Telephone Encounter (Signed)
Returned the call to Ms. Albino to share the recommendations from [place] to treat her diarrhea. Since she does not have a fever and she has had this issue for more than 48 hours, she should begin imodium to treat her symptoms. Langley Gauss was thankful for the return call and advise to begin medication.  ? ?Mont Dutton R.T.(R)(T) ?Radiation Special Procedures Navigator  ?

## 2021-09-11 ENCOUNTER — Encounter: Payer: Self-pay | Admitting: Urology

## 2021-09-11 NOTE — Progress Notes (Signed)
Telephone follow-up. I verified patient identity and began nursing interview. Patient reports LLQ abd pain 7/10. Treated at [facility] for gastroenteritis. Patient is recovering well. No other issues reported to me at this time. ? ?Meaningful use complete. ?Hysterectomy- No chances of pregnancy. ? ?Reminded patient of her 9:30am-09/12/21 telephone appointment w/ Ashlyn Bruning PA-C. I left my extension in case patient needs anything. ?

## 2021-09-12 ENCOUNTER — Other Ambulatory Visit: Payer: Self-pay

## 2021-09-12 ENCOUNTER — Ambulatory Visit
Admission: RE | Admit: 2021-09-12 | Discharge: 2021-09-12 | Disposition: A | Discharge status: Home / Self Care | Payer: BC Managed Care – PPO | Source: Ambulatory Visit | Attending: Urology | Admitting: Urology

## 2021-09-12 ENCOUNTER — Ambulatory Visit
Admission: RE | Admit: 2021-09-12 | Discharge: 2021-09-12 | Disposition: A | Discharge status: Home / Self Care | Payer: BC Managed Care – PPO | Source: Ambulatory Visit | Attending: Radiation Oncology | Admitting: Radiation Oncology

## 2021-09-12 DIAGNOSIS — C3412 Malignant neoplasm of upper lobe, left bronchus or lung: Secondary | ICD-10-CM

## 2021-09-12 NOTE — Progress Notes (Signed)
?Radiation Oncology         [phone] ?________________________________ ? ?Outpatient Re-Consultation ? ?Name: Abigail Golden MRN: [id number]  ?Date of Service: 09/12/2021 DOB: Dec 26, 1965 ? ?YO:MAYOKH, Baldemar Friday., PA-C  Abigail Halim., PA-C  ? ?REFERRING PHYSICIAN: Aletha Halim., PA-C ? 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  ? ?  ICD-10-CM   ?1. 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)  C34.12   ?  ? ? ?HISTORY OF PRESENT ILLNESS: Abigail Golden is a 56 y.o. female seen at the request of Dr. Burr Medico. She is well known to our service, having completed stereotactic radiosurgery to brain lesions on 2 separate occasions recently. ? ?In summary, she initially presented to her PCP with new onset episodes of occipital headache and dizziness persistently for approximately 10 days. She has a history of migraines and typical symptoms include frontal headache with associated nausea, photosensitivity but her baseline migraines typically resolve with use of diclofenac or eletriptan. Around 10 days prior to admission, she developed a posterior headache with associated tingling in the occipital and neck regions with associated lightheadedness, nausea, dizziness and some flashing sensation in her vision. This episode lasted approximately 5 minutes but she then continued having intermittent episodes, with at least 1 episode per day, generally occurring while at rest and even occasionally awakening her from her sleep, lasting 5 to 15 minutes. There was no associated weakness, facial droop, or slurred speech.  ? ?An outpatient CT head without contrast revealed 2 separate areas of vasogenic edema, 1 within the inferiorleft frontal region and the other at the left parietal vertex. There were presumed underlying masses in both of those areas, though not clearly depicted. She was instructed to proceed to the ED  where MRI brain revealed at least 5 brain metastases with a 16 mm mass in the posterosuperior left parietal lobe, a 29 mm rim mass in the left temporal lobe, a punctate 2-3 mm lesion right temporal lobe and suspicion of 2 similar small nodules in the superior frontal gyri abutting the midline. ? ?Since these lesions were suggestive of brain metastases, the patient underwent CT chest/abdomen/pelvis.  The Chest CT showed a 4.5 cm left upper lobe spiculated mass compatible with primary lung cancer.  ? ? ?She subsequently underwent bronchoscopy on 04/24/21 under the care of Dr. Valeta Harms.  Final surgical pathology showed malignant cells consistent with adenocarcinoma, but insufficient tissue for molecular studies. 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  We met with the patient on 04/26/21 to discuss stereotactic radiosurgery to the brain lesions which she was in agreement to proceed with. On 3T SRS planning MRI brain, she was noted to have one additional small lesion that was included in her 3 fraction SRS treatment that was completed 05/13/22 and tolerated very well. PET scan 05/13/21 showed LUL mass extending along superior mediastinal border from hilum towards apex but no lymph node or other distant metastasis. She was started on Tagrisso targeted systemic therapy on 05/22/22. ? ?She had a restaging CT C/A/P on 08/05/21 showing no substantial change in the LUL lesion and slightly enlarging adjacent prevascular/AP window lymph nodes as well as development of upper normal bilateral common iliac and left external iliac lymph nodes. Fortunately, no evidence for metastatic disease in the abdomen/pelvis. It was felt that the lymph nodes could be reactive and  not definitive for cancer progression so the plan was to repeat a CT in 2 months.  Unfortunately, her post-treatment MRI brain on 08/16/21 showed two new lesions with a 3 mm enhancing lesion in the left  frontal lobe along the superior frontal sulcus and a 4 mm enhancing lesion in the left superior frontal gyrus with mild edema. The recommendation was for a single fraction SRS treatment of the two new lesions and this was completed 08/27/21 and tolerated well. Given the progressive disease in the brain, she had a repeat PET scan on 08/28/21 and this showed a similar size and metabolic activity in the primary left lung mass but new, hypermetabolic ipsilateral nodal metastasis in the prevascular nodal space, left lower paratracheal nodal station, and the left supraclavicular space. She met with Dr. Burr Medico on 09/04/21 to discuss treatment options and the decision was to remain on Tagrisso, since she has limited disease progression, but also consider definitive radiotherapy to the lung mass and involved lymph nodes. ? ?She was referred back to [place] today for further discussion of potential radiotherapy in the management of the progressive systemic disease in the chest. ? ?PREVIOUS RADIATION THERAPY: No ? ?PAST MEDICAL HISTORY:  ?Past Medical History:  ?Diagnosis Date  ? Allergy   ? Anxiety   ? Chronic bronchitis ([place])   ? Depression   ? Headache   ? Hypoglycemia   ? Lower back pain   ? since fall ~ 2012 (05/25/2015)  ? Migraine   ? 05/25/2015 "probably once/month"  ? Migraine headache   ? Pain in left hip   ? since fall ~ 2012 (05/25/2015)  ? Pneumonia "several times"  ?   ? ?PAST SURGICAL HISTORY: ?Past Surgical History:  ?Procedure Laterality Date  ? ABDOMINAL HYSTERECTOMY  11/2002  ? "found benign tumors"  ? BRONCHIAL BIOPSY  04/24/2021  ? Procedure: BRONCHIAL BIOPSIES;  Surgeon: Garner Nash, DO;  Location: [facility];  Service: Pulmonary;;  ? BRONCHIAL BRUSHINGS  04/24/2021  ? Procedure: BRONCHIAL BRUSHINGS;  Surgeon: Garner Nash, DO;  Location: [facility];  Service: Pulmonary;;  ? BRONCHIAL NEEDLE ASPIRATION BIOPSY  04/24/2021  ? Procedure: BRONCHIAL NEEDLE ASPIRATION BIOPSIES;  Surgeon: Garner Nash, DO;   Location: [facility];  Service: Pulmonary;;  ? CHEST TUBE INSERTION  04/24/2021  ? Procedure: CHEST TUBE INSERTION;  Surgeon: Garner Nash, DO;  Location: [facility];  Service: Pulmonary;;  ? CHOLECYSTECTOMY N/A 05/25/2015  ? Procedure: LAPAROSCOPIC CHOLECYSTECTOMY;  Surgeon: Ralene Ok, MD;  Location: [facility];  Service: General;  Laterality: N/A;  ? LAPAROSCOPIC CHOLECYSTECTOMY  05/25/2015  ? MENISCUS REPAIR Right 03/2017  ? VIDEO BRONCHOSCOPY WITH ENDOBRONCHIAL NAVIGATION N/A 04/24/2021  ? Procedure: VIDEO BRONCHOSCOPY WITH ENDOBRONCHIAL NAVIGATION;  Surgeon: Garner Nash, DO;  Location: [facility];  Service: Pulmonary;  Laterality: N/A;  ? VIDEO BRONCHOSCOPY WITH RADIAL ENDOBRONCHIAL ULTRASOUND  04/24/2021  ? Procedure: VIDEO BRONCHOSCOPY WITH RADIAL ENDOBRONCHIAL ULTRASOUND;  Surgeon: Garner Nash, DO;  Location: [facility];  Service: Pulmonary;;  ? ? ?FAMILY HISTORY:  ?Family History  ?Problem Relation Age of Onset  ? Heart disease Mother   ? Moore White syndrome Father   ? [place] White syndrome Paternal Grandmother   ? Diabetes Paternal Grandmother   ? Breast cancer Neg Hx   ? ? ?SOCIAL HISTORY:  ?Social History  ? ?Socioeconomic History  ? Marital status: Single  ?  Spouse name: Not on file  ? Number of children: 0  ? Years of  education: 2 years of college  ? Highest education level: Not on file  ?Occupational History  ? Not on file  ?Tobacco Use  ? Smoking status: Never  ? Smokeless tobacco: Never  ?Vaping Use  ? Vaping Use: Never used  ?Substance and Sexual Activity  ? Alcohol use: Never  ? Drug use: Never  ? Sexual activity: Not on file  ?Other Topics Concern  ? Not on file  ?Social History Narrative  ? Lives at home alone  ? Caffeine: 16 oz daily  ? ?Social Determinants of Health  ? ?Financial Resource Strain: Not on file  ?Food Insecurity: Not on file  ?Transportation Needs: Not on file  ?Physical Activity: Not on file  ?Stress: Not on file  ?Social Connections: Not  on file  ?Intimate Partner Violence: Not on file  ? ? ?ALLERGIES: Celebrex [celecoxib] and Minocycline ? ?MEDICATIONS:  ?Current Outpatient Medications  ?Medication Sig Dispense Refill  ? acetaminophen (TYLENOL) 50

## 2021-09-12 NOTE — Progress Notes (Signed)
?  Radiation Oncology         [phone] ?________________________________ ? ?Name: Abigail Golden MRN: [id number]  ?Date: 09/12/2021  DOB: May 11, 1966 ? ?SIMULATION AND TREATMENT PLANNING NOTE ? ?  ICD-10-CM   ?1. Primary adenocarcinoma of upper lobe of left lung ([facility])  C34.12   ?  ? ? 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 ? ?NARRATIVE:  The patient was brought to the [facility].  Identity was confirmed.  All relevant records and images related to the planned course of therapy were reviewed.  The patient freely provided informed written consent to proceed with treatment after reviewing the details related to the planned course of therapy. The consent form was witnessed and verified by the simulation staff.  Then, the patient was set-up in a stable reproducible  supine position for radiation therapy.  CT images were obtained.  Surface markings were placed.  The CT images were loaded into the planning software.  Then the target and avoidance structures were contoured.  Treatment planning then occurred.  The radiation prescription was entered and confirmed.  Then, I designed and supervised the construction of a total of 6 medically necessary complex treatment devices, including a BodyFix immobilization mold custom fitted to the patient along with 5 multileaf collimators conformally shaped radiation around the treatment target while shielding critical structures such as the heart and spinal cord maximally.  I have requested : 3D Simulation  I have requested a DVH of the following structures: Left lung, right lung, spinal cord, heart, esophagus, and target.  I have ordered:Nutrition Consult ? ?SPECIAL TREATMENT PROCEDURE:  The planned course of therapy using radiation constitutes a special treatment procedure. Special care is required in the management of this patient for the following reasons.  The  patient will be receiving concurrent Tagrisso requiring careful monitoring for potentially increased toxicities of treatment.  The special nature of the planned course of radiotherapy will require increased physician supervision and oversight to ensure patient's safety with optimal treatment outcomes. ? ?PLAN:  The patient will receive 66 Gy in 33 fractions. ? ?________________________________ ? ?Sheral Apley Tammi Klippel, M.D. ? ?

## 2021-09-16 ENCOUNTER — Other Ambulatory Visit (HOSPITAL_COMMUNITY): Payer: Self-pay

## 2021-09-17 ENCOUNTER — Other Ambulatory Visit (HOSPITAL_COMMUNITY): Payer: Self-pay

## 2021-09-17 DIAGNOSIS — C3412 Malignant neoplasm of upper lobe, left bronchus or lung: Secondary | ICD-10-CM | POA: Diagnosis not present

## 2021-09-19 ENCOUNTER — Ambulatory Visit
Admission: RE | Admit: 2021-09-19 | Discharge: 2021-09-19 | Disposition: A | Discharge status: Home / Self Care | Payer: BC Managed Care – PPO | Source: Ambulatory Visit | Attending: Radiation Oncology | Admitting: Radiation Oncology

## 2021-09-19 ENCOUNTER — Other Ambulatory Visit: Payer: Self-pay

## 2021-09-19 DIAGNOSIS — C3412 Malignant neoplasm of upper lobe, left bronchus or lung: Secondary | ICD-10-CM | POA: Diagnosis not present

## 2021-09-20 ENCOUNTER — Ambulatory Visit
Admission: RE | Admit: 2021-09-20 | Discharge: 2021-09-20 | Disposition: A | Discharge status: Home / Self Care | Payer: BC Managed Care – PPO | Source: Ambulatory Visit | Attending: Radiation Oncology | Admitting: Radiation Oncology

## 2021-09-20 DIAGNOSIS — C3412 Malignant neoplasm of upper lobe, left bronchus or lung: Secondary | ICD-10-CM | POA: Diagnosis not present

## 2021-09-20 NOTE — Addendum Note (Signed)
Encounter addended by: Karsten Ro, DO on: 09/20/2021 10:35 AM ? Actions taken: Clinical Note Signed

## 2021-09-20 NOTE — Op Note (Signed)
Name: Abigail Golden    MRN: [id number]   ?Date: 08/27/2021    DOB: 05-08-1966 ? ? ?STEREOTACTIC RADIOSURGERY OPERATIVE NOTE ? 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 ?POST-OPERATIVE DIAGNOSIS:  Same ? ?PROCEDURE:  Stereotactic Radiosurgery ? ?SURGEON:  Bastion Bolger, DO ? ?RADIATION ONCOLOGIST: Dr. Tammi Klippel ? ?TECHNIQUE:  ?The patient underwent a radiation treatment planning session in the radiation oncology simulation suite under the care of the radiation oncology physician and physicist.  I participated closely in the radiation treatment planning afterwards. The patient underwent planning CT which was fused to 3T high resolution MRI with 1 mm axial slices.  These images were fused on the planning system.  We contoured the gross target volumes and subsequently expanded this to yield the Planning Target Volume. I actively participated in the planning process.  I helped to define and review the target contours and also the contours of the optic pathway, eyes, brainstem and selected nearby organs at risk.  All the dose constraints for critical structures were reviewed and compared to AAPM Task Group 101.  The prescription dose conformity was reviewed.  I approved the plan electronically.   ? ?Accordingly, Abigail Golden  was brought to the TrueBeam stereotactic radiation treatment linac and placed in the custom immobilization mask.  The patient was aligned according to the IR fiducial markers with BrainLab Exactrac, then orthogonal x-rays were used in ExacTrac with the 6DOF robotic table and the shifts were made to align the patient. ? ?Abigail Golden received stereotactic radiosurgery to the following targets: ?PTV7-Left Frontal 4 mm and PTV8-Left Frontal 3 mm targets were treated using a single isocenter using 7 Dynamic Conformal Arcs to a prescription dose of 20 Gy.  ExacTrac registration was performed for each couch angle.  The 100% isodose line was  prescribed.  6 MV X-rays were delivered in the flattening filter free beam mode. ? ?The detailed description of the procedure is recorded in the radiation oncology procedure note.  I was present for the duration of the procedure. ? ?DISPOSITION:   ?Following delivery, the patient was transported to nursing in stable condition and monitored for possible acute effects to be discharged to home in stable condition with follow-up in one month. ? ?[place] Jovana Rembold, DO ?[facility]  ?

## 2021-09-22 ENCOUNTER — Encounter: Payer: Self-pay | Admitting: Hematology

## 2021-09-23 ENCOUNTER — Other Ambulatory Visit: Payer: Self-pay

## 2021-09-23 ENCOUNTER — Ambulatory Visit
Admission: RE | Admit: 2021-09-23 | Discharge: 2021-09-23 | Disposition: A | Discharge status: Home / Self Care | Payer: BC Managed Care – PPO | Source: Ambulatory Visit | Attending: Radiation Oncology | Admitting: Radiation Oncology

## 2021-09-23 DIAGNOSIS — Z7952 Long term (current) use of systemic steroids: Secondary | ICD-10-CM | POA: Diagnosis not present

## 2021-09-23 DIAGNOSIS — C7931 Secondary malignant neoplasm of brain: Secondary | ICD-10-CM | POA: Diagnosis not present

## 2021-09-23 DIAGNOSIS — Z510 Encounter for antineoplastic radiation therapy: Secondary | ICD-10-CM | POA: Diagnosis present

## 2021-09-23 DIAGNOSIS — G4700 Insomnia, unspecified: Secondary | ICD-10-CM | POA: Diagnosis not present

## 2021-09-23 DIAGNOSIS — Z79899 Other long term (current) drug therapy: Secondary | ICD-10-CM | POA: Insufficient documentation

## 2021-09-23 DIAGNOSIS — R21 Rash and other nonspecific skin eruption: Secondary | ICD-10-CM | POA: Insufficient documentation

## 2021-09-23 DIAGNOSIS — R197 Diarrhea, unspecified: Secondary | ICD-10-CM | POA: Diagnosis not present

## 2021-09-23 DIAGNOSIS — C3412 Malignant neoplasm of upper lobe, left bronchus or lung: Secondary | ICD-10-CM | POA: Insufficient documentation

## 2021-09-23 DIAGNOSIS — Z7982 Long term (current) use of aspirin: Secondary | ICD-10-CM | POA: Diagnosis not present

## 2021-09-24 ENCOUNTER — Ambulatory Visit
Admission: RE | Admit: 2021-09-24 | Discharge: 2021-09-24 | Disposition: A | Discharge status: Home / Self Care | Payer: BC Managed Care – PPO | Source: Ambulatory Visit | Attending: Radiation Oncology | Admitting: Radiation Oncology

## 2021-09-24 DIAGNOSIS — C3412 Malignant neoplasm of upper lobe, left bronchus or lung: Secondary | ICD-10-CM | POA: Diagnosis not present

## 2021-09-25 ENCOUNTER — Other Ambulatory Visit: Payer: Self-pay

## 2021-09-25 ENCOUNTER — Ambulatory Visit: Payer: Self-pay | Admitting: Urology

## 2021-09-25 ENCOUNTER — Ambulatory Visit
Admission: RE | Admit: 2021-09-25 | Discharge: 2021-09-25 | Disposition: A | Discharge status: Home / Self Care | Payer: BC Managed Care – PPO | Source: Ambulatory Visit | Attending: Radiation Oncology | Admitting: Radiation Oncology

## 2021-09-25 DIAGNOSIS — C3412 Malignant neoplasm of upper lobe, left bronchus or lung: Secondary | ICD-10-CM | POA: Diagnosis not present

## 2021-09-26 ENCOUNTER — Ambulatory Visit
Admission: RE | Admit: 2021-09-26 | Discharge: 2021-09-26 | Disposition: A | Discharge status: Home / Self Care | Payer: BC Managed Care – PPO | Source: Ambulatory Visit | Attending: Radiation Oncology | Admitting: Radiation Oncology

## 2021-09-26 DIAGNOSIS — C3412 Malignant neoplasm of upper lobe, left bronchus or lung: Secondary | ICD-10-CM | POA: Diagnosis not present

## 2021-09-27 ENCOUNTER — Other Ambulatory Visit: Payer: Self-pay

## 2021-09-27 ENCOUNTER — Ambulatory Visit
Admission: RE | Admit: 2021-09-27 | Discharge: 2021-09-27 | Disposition: A | Discharge status: Home / Self Care | Payer: BC Managed Care – PPO | Source: Ambulatory Visit | Attending: Radiation Oncology | Admitting: Radiation Oncology

## 2021-09-27 ENCOUNTER — Other Ambulatory Visit: Payer: Self-pay | Admitting: Radiation Therapy

## 2021-09-27 DIAGNOSIS — C7931 Secondary malignant neoplasm of brain: Secondary | ICD-10-CM

## 2021-09-27 DIAGNOSIS — C3412 Malignant neoplasm of upper lobe, left bronchus or lung: Secondary | ICD-10-CM | POA: Diagnosis not present

## 2021-09-30 ENCOUNTER — Inpatient Hospital Stay: Payer: BC Managed Care – PPO | Admitting: Hematology

## 2021-09-30 ENCOUNTER — Ambulatory Visit
Admission: RE | Admit: 2021-09-30 | Discharge: 2021-09-30 | Disposition: A | Discharge status: Home / Self Care | Payer: BC Managed Care – PPO | Source: Ambulatory Visit | Attending: Radiation Oncology | Admitting: Radiation Oncology

## 2021-09-30 ENCOUNTER — Encounter: Payer: Self-pay | Admitting: Hematology

## 2021-09-30 ENCOUNTER — Inpatient Hospital Stay (HOSPITAL_BASED_OUTPATIENT_CLINIC_OR_DEPARTMENT_OTHER): Payer: BC Managed Care – PPO

## 2021-09-30 ENCOUNTER — Other Ambulatory Visit: Payer: Self-pay

## 2021-09-30 VITALS — BP 127/52 | HR 75 | Temp 97.9°F | Resp 19 | Ht 60.0 in | Wt 277.5 lb

## 2021-09-30 DIAGNOSIS — Z7982 Long term (current) use of aspirin: Secondary | ICD-10-CM | POA: Insufficient documentation

## 2021-09-30 DIAGNOSIS — C7931 Secondary malignant neoplasm of brain: Secondary | ICD-10-CM | POA: Insufficient documentation

## 2021-09-30 DIAGNOSIS — C3412 Malignant neoplasm of upper lobe, left bronchus or lung: Secondary | ICD-10-CM | POA: Diagnosis not present

## 2021-09-30 DIAGNOSIS — R21 Rash and other nonspecific skin eruption: Secondary | ICD-10-CM | POA: Insufficient documentation

## 2021-09-30 DIAGNOSIS — Z79899 Other long term (current) drug therapy: Secondary | ICD-10-CM | POA: Insufficient documentation

## 2021-09-30 DIAGNOSIS — G4700 Insomnia, unspecified: Secondary | ICD-10-CM | POA: Insufficient documentation

## 2021-09-30 DIAGNOSIS — Z7952 Long term (current) use of systemic steroids: Secondary | ICD-10-CM | POA: Insufficient documentation

## 2021-09-30 DIAGNOSIS — R197 Diarrhea, unspecified: Secondary | ICD-10-CM | POA: Insufficient documentation

## 2021-09-30 DIAGNOSIS — T451X5A Adverse effect of antineoplastic and immunosuppressive drugs, initial encounter: Secondary | ICD-10-CM

## 2021-09-30 DIAGNOSIS — E860 Dehydration: Secondary | ICD-10-CM

## 2021-09-30 LAB — CMP (CANCER CENTER ONLY)
ALT: 24 U/L (ref 0–44)
AST: 23 U/L (ref 15–41)
Albumin: 4.1 g/dL (ref 3.5–5.0)
Alkaline Phosphatase: 62 U/L (ref 38–126)
Anion gap: 8 (ref 5–15)
BUN: 13 mg/dL (ref 6–20)
CO2: 24 mmol/L (ref 22–32)
Calcium: 9.4 mg/dL (ref 8.9–10.3)
Chloride: 105 mmol/L (ref 98–111)
Creatinine: 1.02 mg/dL — ABNORMAL HIGH (ref 0.44–1.00)
GFR, Estimated: >60 mL/min (ref >60)
Glucose, Bld: 109 mg/dL — ABNORMAL HIGH (ref 70–99)
Potassium: 3.9 mmol/L (ref 3.5–5.1)
Sodium: 137 mmol/L (ref 135–145)
Total Bilirubin: 0.6 mg/dL (ref 0.3–1.2)
Total Protein: 7.2 g/dL (ref 6.5–8.1)

## 2021-09-30 LAB — CBC WITH DIFFERENTIAL (CANCER CENTER ONLY)
Abs Immature Granulocytes: 0 10*3/uL (ref 0.00–0.07)
Basophils Absolute: 0 10*3/uL (ref 0.0–0.1)
Basophils Relative: 0 %
Eosinophils Absolute: 0 10*3/uL (ref 0.0–0.5)
Eosinophils Relative: 0 %
HCT: 35.2 % — ABNORMAL LOW (ref 36.0–46.0)
Hemoglobin: 11.6 g/dL — ABNORMAL LOW (ref 12.0–15.0)
Immature Granulocytes: 0 %
Lymphocytes Relative: 34 %
Lymphs Abs: 0.8 10*3/uL (ref 0.7–4.0)
MCH: 26.7 pg (ref 26.0–34.0)
MCHC: 33 g/dL (ref 30.0–36.0)
MCV: 80.9 fL (ref 80.0–100.0)
Monocytes Absolute: 0.3 10*3/uL (ref 0.1–1.0)
Monocytes Relative: 13 %
Neutro Abs: 1.2 10*3/uL — ABNORMAL LOW (ref 1.7–7.7)
Neutrophils Relative %: 53 %
Platelet Count: 141 10*3/uL — ABNORMAL LOW (ref 150–400)
RBC: 4.35 MIL/uL (ref 3.87–5.11)
RDW: 13.2 % (ref 11.5–15.5)
WBC Count: 2.2 10*3/uL — ABNORMAL LOW (ref 4.0–10.5)
nRBC: 0 % (ref 0.0–0.2)

## 2021-09-30 MED ORDER — ONDANSETRON 4 MG PO TBDP: 4.0000 mg | ORAL_TABLET | Freq: Three times a day (TID) | ORAL | 0 refills | Status: DC | PRN

## 2021-09-30 NOTE — Progress Notes (Signed)
?[name]   ?[phone] [phone]   ?Clinic Follow up Note  ? ?Patient Care Team: ?Amie Critchley as PCP - General (Family Medicine) ?Valrie Hart, RN as Oncology Nurse Navigator (Oncology) ? ?Date of Service:  09/30/2021 ? ?CHIEF COMPLAINT: f/u of metastatic lung cancer ? ?CURRENT THERAPY:  ?Tagrisso, 80 mg daily, started 05/22/21 ?-Radiation to chest, started 09/19/21 ? ?ASSESSMENT & PLAN:  ?Abigail Golden is a 56 y.o. female with  ? ?1. 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 Brain MRI showed multifocal metastatic disease. ?-staging CT CAP 04/23/21 showed: 4.5 cm LUL mass that abuts pleural surface; no pathologically enlarged lymph nodes or metastatic disease. ?-bronchoscopy on 04/24/21 under Dr. Valeta Harms showed malignant cells consistent with adenocarcinoma, insufficient tissue for molecular studies. Guardant 360 was obtained and showed EGFR L858R mutation, which predicts good response to EGFR inhibitor. ?-PET scan 05/13/21 showed LUL mass extending along superior mediastinal border from hilum towards apex.  No lymph node or other distant metastasis. ?-she started Tagrisso on 05/22/21. She tolerates moderately well with mucositis and skin toxicities.  ?-restaging CT CAP on 08/05/21 showing: no substantial change in LUL lesion; slightly enlarging  adjacent prevascular/AP window lymph nodes; development of upper normal bilateral common iliac and left external iliac lymph nodes; no evidence for metastatic disease in abdomen/pelvis. ?-PET scan on 08/28/21 showed: similar size and metabolic activity to primary left lung mass; new hypermetabolic ipsilateral nodal metastasis to prevascular nodal space, left lower paratracheal nodal station, and left supraclavicular.  ?-she was referred back  to Dr. Tammi Klippel and began concurrent radiation therapy to the chest on 09/19/21. She is scheduled for 6.5 weeks of radiation, last dose on 11/04/21. ?-she will continue tagrisso. Labs reviewed, overall stable. I will see her back in 3 weeks. ?  ?2. Symptom Management: Rash, Nausea, Diarrhea, Insomnia ?-secondary to Tagrisso ?-intermittent diarrhea managed with imodium (worsened after CT contrast). ?-she reports issues sleeping. I called in [place] for her on 09/04/21. ?  ?3. Brain Metastases ?-brain MRI 04/23/21 showed 2 brain masses with moderate vasogenic edema left parietal lobe and left inferior frontal gyrus.  There were 2 other small metastases in the right frontal gyrus and right posterior temporal lobe and questionable 2 additional punctate metastases in the superior frontal gyrus. ?-she received three fractions of SRS under Dr. Tammi Klippel 11/16-11/21/22. ?-surveillance brain MRI on 08/16/21 revealed two new left frontal lobe metastases measuring 3-4 mm and decreased size or resolution of treated metastases. ?-she received one fraction of SRS to the new metastases on 08/27/21. ?  ?4. 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 Abigail Golden is now 59 and the patient lives with her and she helps the patient due to her recent diagnosis. 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  ?-she is in touch with her father, [name], but Abigail Golden describes him as being unable to fully comprehend her situation. ?-The patient has strong social support at church and has several people that can drive her to appointments. ?-Discussed with the patient that we have a Education officer, museum onsite if she ever has any concerns with transportation ?-she is currently on disability ?  ?5. Goal of care discussion  ?-We previously discussed the incurable nature of her cancer, and the overall poor prognosis, especially if she does not have  good response to chemotherapy or progress on chemo ?-The patient understands the goal  of care is palliative. ?-I recommend DNR/DNI, she has already filled out a living will and has named Abigail Golden her HCPOA. She verbally agreed again on 09/04/21. ?  ?  ?Plan:  ?-continue daily radiation ?-continue Tagrisso 61m daily ?-lab and f/u in 3 weeks ? ? ?No problem-specific Assessment & Plan notes found for this encounter. ? ? ?SUMMARY OF ONCOLOGIC HISTORY: ?Oncology History Overview Note  ? Cancer Staging  ?Primary adenocarcinoma of upper lobe of left lung ([facility] ?Staging form: Lung, AJCC 8th Edition ?- Clinical stage from 04/24/2021: Stage IVA (cT2, cN2, pM1b) - Signed by FTruitt Merle MD on 05/14/2021 ? ?  ?Primary adenocarcinoma of upper lobe of left lung ([facility]  ?04/22/2021 Imaging  ? CT HEAD WO CONTRAST  ? ?IMPRESSION: ?Two separate areas of vasogenic edema within the left hemisphere, 1 within the inferior frontal region in the other at the left parietal vertex. Metastatic disease would be the most likely cause of this appearance. Other possibilities include venous infarctions and cerebritis. MRI with contrast recommended. ? ?  ?04/23/2021 Imaging  ? MR Brain W and Wo Contrast  ?IMPRESSION: ?1. Metastatic disease to the brain. ?Two enhancing brain masses with moderate associated vasogenic edema: solid 16 mm left parietal lobe mass, and a larger 29 mm rim enhancing cystic or necrotic mass in the left inferior frontal ?gyrus. Two other small 2-3 mm metastases in the right inferior frontal gyrus, right posterior temporal lobe. And questionable two additional punctate metastases in both superior frontal gyri abutting the falx (series 18, image 46). ?2. No significant intracranial mass effect. No other intracranial ?abnormality. ?  ?04/23/2021 Imaging  ? CT Chest W Contrast  ?IMPRESSION: ?4.5 cm spiculated left upper lobe mass most compatible with primary lung cancer. This abuts the medial pleural surface. A few small adjacent pre-vascular mediastinal lymph nodes, none pathologically enlarged. Recommend cardiothoracic  surgical and oncologic consultation. ?  ?No acute findings or evidence of metastatic disease in the abdomen or pelvis. ?  ?04/24/2021 Cancer Staging  ? Staging form: Lung, AJCC 8th Edition ?- Clinical stage from 04/24/2021: Stage IVA (cT2, cN2, pM1b) - Signed by FTruitt Merle MD on 05/14/2021 ? ?  ?04/24/2021 Initial Biopsy  ? FINAL MICROSCOPIC DIAGNOSIS:  ? ?A. LUNG, LUL, FINE NEEDLE ASPIRATION:  ?- Malignant cells present, consistent with adenocarcinoma  ? ?B. LUNG, LUL, BRUSHING:  ?- Malignant cells consistent with adenocarcinoma, see comment  ? ? ?COMMENT:  ?B.  Immunohistochemical stains show that the tumor cells are positive for TTF-1 while they are negative for p63 and CK5/6, consistent with above interpretation. The number of tumor cells appears to be insufficient for molecular studies. ?  ?05/03/2021 Initial Diagnosis  ? Lung cancer ([facility] ?  ?05/08/2021 - 05/13/2021 Radiation Therapy  ? SRS treatment to the metastatic brain lesions under the care of Dr. MTammi Klippel ?  ?08/05/2021 Imaging  ? EXAM: ?CT CHEST, ABDOMEN, AND PELVIS WITH CONTRAST ? ?IMPRESSION: ?1. No substantial change in size of the left upper lobe pulmonary ?lesion. ?2. Borderline to mildly enlarged adjacent prevascular/AP window ?lymph nodes have increased in size in the interval. This finding ?raises concern for disease progression. ?3. Interval development of upper normal bilateral common iliac and ?left external iliac lymph nodes. Close attention on follow-up ?recommended. ?4. No other evidence for metastatic disease in the abdomen or ?pelvis. ?  ? ? ? ?INTERVAL HISTORY:  ?BAMBRIE CARTEis here for a  follow up of metastatic lung cancer. She was last seen by me on 09/04/21. She presents to the clinic accompanied by [facility]. ?She reports she is doing well overall. She reports she has developed some difficulty swallowing due to radiation. She notes she had a stomach bug a week or so ago; she reports she had diarrhea during the bug. ?She also reports  itching to her abdomen. She notes she has always been sensitive to dyes and perfumes. ?  ?All other systems were reviewed with the patient and are negative. ? ?MEDICAL HISTORY:  ?Past Medical History:

## 2021-10-01 ENCOUNTER — Ambulatory Visit
Admission: RE | Admit: 2021-10-01 | Discharge: 2021-10-01 | Disposition: A | Discharge status: Home / Self Care | Payer: BC Managed Care – PPO | Source: Ambulatory Visit | Attending: Radiation Oncology | Admitting: Radiation Oncology

## 2021-10-01 DIAGNOSIS — C3412 Malignant neoplasm of upper lobe, left bronchus or lung: Secondary | ICD-10-CM | POA: Diagnosis not present

## 2021-10-02 ENCOUNTER — Other Ambulatory Visit: Payer: Self-pay

## 2021-10-02 ENCOUNTER — Ambulatory Visit
Admission: RE | Admit: 2021-10-02 | Discharge: 2021-10-02 | Disposition: A | Discharge status: Home / Self Care | Payer: BC Managed Care – PPO | Source: Ambulatory Visit | Attending: Radiation Oncology | Admitting: Radiation Oncology

## 2021-10-02 DIAGNOSIS — C3412 Malignant neoplasm of upper lobe, left bronchus or lung: Secondary | ICD-10-CM | POA: Diagnosis not present

## 2021-10-03 ENCOUNTER — Ambulatory Visit
Admission: RE | Admit: 2021-10-03 | Discharge: 2021-10-03 | Disposition: A | Discharge status: Home / Self Care | Payer: BC Managed Care – PPO | Source: Ambulatory Visit | Attending: Radiation Oncology | Admitting: Radiation Oncology

## 2021-10-03 DIAGNOSIS — C3412 Malignant neoplasm of upper lobe, left bronchus or lung: Secondary | ICD-10-CM | POA: Diagnosis not present

## 2021-10-04 ENCOUNTER — Other Ambulatory Visit: Payer: Self-pay | Admitting: Radiation Oncology

## 2021-10-04 ENCOUNTER — Ambulatory Visit
Admission: RE | Admit: 2021-10-04 | Discharge: 2021-10-04 | Disposition: A | Discharge status: Home / Self Care | Payer: BC Managed Care – PPO | Source: Ambulatory Visit | Attending: Radiation Oncology | Admitting: Radiation Oncology

## 2021-10-04 ENCOUNTER — Other Ambulatory Visit: Payer: Self-pay

## 2021-10-04 DIAGNOSIS — C3412 Malignant neoplasm of upper lobe, left bronchus or lung: Secondary | ICD-10-CM | POA: Diagnosis not present

## 2021-10-04 MED ORDER — SUCRALFATE 1 G PO TABS: 1.0000 g | ORAL_TABLET | Freq: Three times a day (TID) | ORAL | 2 refills | Status: DC

## 2021-10-07 ENCOUNTER — Ambulatory Visit
Admission: RE | Admit: 2021-10-07 | Discharge: 2021-10-07 | Disposition: A | Discharge status: Home / Self Care | Payer: BC Managed Care – PPO | Source: Ambulatory Visit | Attending: Radiation Oncology | Admitting: Radiation Oncology

## 2021-10-07 ENCOUNTER — Other Ambulatory Visit: Payer: Self-pay

## 2021-10-07 DIAGNOSIS — C3412 Malignant neoplasm of upper lobe, left bronchus or lung: Secondary | ICD-10-CM | POA: Diagnosis not present

## 2021-10-08 ENCOUNTER — Ambulatory Visit
Admission: RE | Admit: 2021-10-08 | Discharge: 2021-10-08 | Disposition: A | Discharge status: Home / Self Care | Payer: BC Managed Care – PPO | Source: Ambulatory Visit | Attending: Radiation Oncology | Admitting: Radiation Oncology

## 2021-10-08 ENCOUNTER — Other Ambulatory Visit: Payer: Self-pay

## 2021-10-08 DIAGNOSIS — C3412 Malignant neoplasm of upper lobe, left bronchus or lung: Secondary | ICD-10-CM | POA: Diagnosis not present

## 2021-10-08 LAB — RAD ONC ARIA SESSION SUMMARY
Course Elapsed Days: 19
Plan Fractions Treated to Date: 14
Plan Prescribed Dose Per Fraction: 2 Gy
Plan Total Fractions Prescribed: 33
Plan Total Prescribed Dose: 66 Gy
Reference Point Dosage Given to Date: 28 Gy
Reference Point Session Dosage Given: 2 Gy
Session Number: 14

## 2021-10-09 ENCOUNTER — Ambulatory Visit
Admission: RE | Admit: 2021-10-09 | Discharge: 2021-10-09 | Disposition: A | Discharge status: Home / Self Care | Payer: BC Managed Care – PPO | Source: Ambulatory Visit | Attending: Radiation Oncology | Admitting: Radiation Oncology

## 2021-10-09 ENCOUNTER — Other Ambulatory Visit: Payer: Self-pay | Admitting: Urology

## 2021-10-09 ENCOUNTER — Other Ambulatory Visit: Payer: Self-pay

## 2021-10-09 DIAGNOSIS — C3412 Malignant neoplasm of upper lobe, left bronchus or lung: Secondary | ICD-10-CM | POA: Diagnosis not present

## 2021-10-09 LAB — RAD ONC ARIA SESSION SUMMARY
Course Elapsed Days: 20
Plan Fractions Treated to Date: 15
Plan Prescribed Dose Per Fraction: 2 Gy
Plan Total Fractions Prescribed: 33
Plan Total Prescribed Dose: 66 Gy
Reference Point Dosage Given to Date: 30 Gy
Reference Point Session Dosage Given: 2 Gy
Session Number: 15

## 2021-10-09 MED ORDER — HYDROCODONE-ACETAMINOPHEN 7.5-325 MG/15ML PO SOLN: 10.0000 mL | ORAL | 0 refills | Status: DC | PRN

## 2021-10-09 MED ORDER — FLUCONAZOLE 200 MG PO TABS: 200.0000 mg | ORAL_TABLET | Freq: Every day | ORAL | 0 refills | Status: DC

## 2021-10-10 ENCOUNTER — Ambulatory Visit
Admission: RE | Admit: 2021-10-10 | Discharge: 2021-10-10 | Disposition: A | Discharge status: Home / Self Care | Payer: BC Managed Care – PPO | Source: Ambulatory Visit | Attending: Radiation Oncology | Admitting: Radiation Oncology

## 2021-10-10 ENCOUNTER — Other Ambulatory Visit: Payer: Self-pay

## 2021-10-10 ENCOUNTER — Other Ambulatory Visit (HOSPITAL_COMMUNITY): Payer: Self-pay

## 2021-10-10 DIAGNOSIS — C3412 Malignant neoplasm of upper lobe, left bronchus or lung: Secondary | ICD-10-CM | POA: Diagnosis not present

## 2021-10-10 LAB — RAD ONC ARIA SESSION SUMMARY
Course Elapsed Days: 21
Plan Fractions Treated to Date: 16
Plan Prescribed Dose Per Fraction: 2 Gy
Plan Total Fractions Prescribed: 33
Plan Total Prescribed Dose: 66 Gy
Reference Point Dosage Given to Date: 32 Gy
Reference Point Session Dosage Given: 2 Gy
Session Number: 16

## 2021-10-11 ENCOUNTER — Ambulatory Visit
Admission: RE | Admit: 2021-10-11 | Discharge: 2021-10-11 | Disposition: A | Discharge status: Home / Self Care | Payer: BC Managed Care – PPO | Source: Ambulatory Visit | Attending: Radiation Oncology | Admitting: Radiation Oncology

## 2021-10-11 ENCOUNTER — Other Ambulatory Visit: Payer: Self-pay

## 2021-10-11 DIAGNOSIS — C3412 Malignant neoplasm of upper lobe, left bronchus or lung: Secondary | ICD-10-CM | POA: Diagnosis not present

## 2021-10-11 LAB — RAD ONC ARIA SESSION SUMMARY
Course Elapsed Days: 22
Plan Fractions Treated to Date: 17
Plan Prescribed Dose Per Fraction: 2 Gy
Plan Total Fractions Prescribed: 33
Plan Total Prescribed Dose: 66 Gy
Reference Point Dosage Given to Date: 34 Gy
Reference Point Session Dosage Given: 2 Gy
Session Number: 17

## 2021-10-14 ENCOUNTER — Ambulatory Visit
Admission: RE | Admit: 2021-10-14 | Discharge: 2021-10-14 | Disposition: A | Discharge status: Home / Self Care | Payer: BC Managed Care – PPO | Source: Ambulatory Visit | Attending: Radiation Oncology | Admitting: Radiation Oncology

## 2021-10-14 ENCOUNTER — Other Ambulatory Visit: Payer: Self-pay

## 2021-10-14 DIAGNOSIS — C3412 Malignant neoplasm of upper lobe, left bronchus or lung: Secondary | ICD-10-CM | POA: Diagnosis not present

## 2021-10-14 LAB — RAD ONC ARIA SESSION SUMMARY
Course Elapsed Days: 25
Plan Fractions Treated to Date: 18
Plan Prescribed Dose Per Fraction: 2 Gy
Plan Total Fractions Prescribed: 33
Plan Total Prescribed Dose: 66 Gy
Reference Point Dosage Given to Date: 36 Gy
Reference Point Session Dosage Given: 2 Gy
Session Number: 18

## 2021-10-15 ENCOUNTER — Other Ambulatory Visit: Payer: Self-pay

## 2021-10-15 ENCOUNTER — Ambulatory Visit
Admission: RE | Admit: 2021-10-15 | Discharge: 2021-10-15 | Disposition: A | Discharge status: Home / Self Care | Payer: BC Managed Care – PPO | Source: Ambulatory Visit | Attending: Radiation Oncology | Admitting: Radiation Oncology

## 2021-10-15 DIAGNOSIS — C3412 Malignant neoplasm of upper lobe, left bronchus or lung: Secondary | ICD-10-CM | POA: Diagnosis not present

## 2021-10-15 LAB — RAD ONC ARIA SESSION SUMMARY
Course Elapsed Days: 26
Plan Fractions Treated to Date: 19
Plan Prescribed Dose Per Fraction: 2 Gy
Plan Total Fractions Prescribed: 33
Plan Total Prescribed Dose: 66 Gy
Reference Point Dosage Given to Date: 38 Gy
Reference Point Session Dosage Given: 2 Gy
Session Number: 19

## 2021-10-16 ENCOUNTER — Encounter: Payer: Self-pay | Admitting: Hematology

## 2021-10-16 ENCOUNTER — Other Ambulatory Visit: Payer: Self-pay

## 2021-10-16 ENCOUNTER — Ambulatory Visit
Admission: RE | Admit: 2021-10-16 | Discharge: 2021-10-16 | Disposition: A | Discharge status: Home / Self Care | Payer: BC Managed Care – PPO | Source: Ambulatory Visit | Attending: Radiation Oncology | Admitting: Radiation Oncology

## 2021-10-16 DIAGNOSIS — C3412 Malignant neoplasm of upper lobe, left bronchus or lung: Secondary | ICD-10-CM | POA: Diagnosis not present

## 2021-10-16 LAB — RAD ONC ARIA SESSION SUMMARY
Course Elapsed Days: 27
Plan Fractions Treated to Date: 20
Plan Prescribed Dose Per Fraction: 2 Gy
Plan Total Fractions Prescribed: 33
Plan Total Prescribed Dose: 66 Gy
Reference Point Dosage Given to Date: 40 Gy
Reference Point Session Dosage Given: 2 Gy
Session Number: 20

## 2021-10-16 NOTE — Progress Notes (Signed)
?  Radiation Oncology         [phone] ?________________________________ ? ?Name: Abigail Golden MRN: [id number]  ?Date: 08/27/2021  DOB: [birth year] ? ?End of Treatment Note ? ?Diagnosis:   56 yo woman with two brain metastases from left upper lung cancer    ? ?Indication for treatment:  Palliation      ? ?Radiation treatment dates:   08/27/21 ? ?Site/dose/beams/energy:   PTV7-Left Frontal 4 mm and PTV8-Left Frontal 3 mm targets were treated using a single isocenter using 7 Dynamic Conformal Arcs to a prescription dose of 20 Gy.  ExacTrac registration was performed for each couch angle.  The 100% isodose line was prescribed.  6 MV X-rays were delivered in the flattening filter free beam mode. ? ?Narrative: The patient tolerated radiation treatment relatively well.    ? ?Plan: The patient has completed radiation treatment. The patient will return to radiation oncology clinic for routine followup in one month. I advised her to call or return sooner if she has any questions or concerns related to her recovery or treatment. ?________________________________ ? ?Sheral Apley Tammi Klippel, M.D. ?  ?

## 2021-10-17 ENCOUNTER — Other Ambulatory Visit (HOSPITAL_COMMUNITY): Payer: Self-pay

## 2021-10-17 ENCOUNTER — Other Ambulatory Visit: Payer: Self-pay

## 2021-10-17 ENCOUNTER — Ambulatory Visit
Admission: RE | Admit: 2021-10-17 | Discharge: 2021-10-17 | Disposition: A | Discharge status: Home / Self Care | Payer: BC Managed Care – PPO | Source: Ambulatory Visit | Attending: Radiation Oncology | Admitting: Radiation Oncology

## 2021-10-17 DIAGNOSIS — C3412 Malignant neoplasm of upper lobe, left bronchus or lung: Secondary | ICD-10-CM | POA: Diagnosis not present

## 2021-10-17 LAB — RAD ONC ARIA SESSION SUMMARY
Course Elapsed Days: 28
Plan Fractions Treated to Date: 21
Plan Prescribed Dose Per Fraction: 2 Gy
Plan Total Fractions Prescribed: 33
Plan Total Prescribed Dose: 66 Gy
Reference Point Dosage Given to Date: 42 Gy
Reference Point Session Dosage Given: 2 Gy
Session Number: 21

## 2021-10-18 ENCOUNTER — Ambulatory Visit
Admission: RE | Admit: 2021-10-18 | Discharge: 2021-10-18 | Disposition: A | Discharge status: Home / Self Care | Payer: BC Managed Care – PPO | Source: Ambulatory Visit | Attending: Radiation Oncology | Admitting: Radiation Oncology

## 2021-10-18 ENCOUNTER — Other Ambulatory Visit: Payer: Self-pay

## 2021-10-18 DIAGNOSIS — C3412 Malignant neoplasm of upper lobe, left bronchus or lung: Secondary | ICD-10-CM | POA: Diagnosis not present

## 2021-10-18 LAB — RAD ONC ARIA SESSION SUMMARY
Course Elapsed Days: 29
Plan Fractions Treated to Date: 22
Plan Prescribed Dose Per Fraction: 2 Gy
Plan Total Fractions Prescribed: 33
Plan Total Prescribed Dose: 66 Gy
Reference Point Dosage Given to Date: 44 Gy
Reference Point Session Dosage Given: 2 Gy
Session Number: 22

## 2021-10-21 ENCOUNTER — Ambulatory Visit
Admission: RE | Admit: 2021-10-21 | Discharge: 2021-10-21 | Disposition: A | Discharge status: Home / Self Care | Payer: BC Managed Care – PPO | Source: Ambulatory Visit | Attending: Radiation Oncology | Admitting: Radiation Oncology

## 2021-10-21 ENCOUNTER — Other Ambulatory Visit: Payer: Self-pay

## 2021-10-21 DIAGNOSIS — E860 Dehydration: Secondary | ICD-10-CM | POA: Insufficient documentation

## 2021-10-21 DIAGNOSIS — Z79899 Other long term (current) drug therapy: Secondary | ICD-10-CM | POA: Insufficient documentation

## 2021-10-21 DIAGNOSIS — K209 Esophagitis, unspecified without bleeding: Secondary | ICD-10-CM | POA: Diagnosis not present

## 2021-10-21 DIAGNOSIS — C3412 Malignant neoplasm of upper lobe, left bronchus or lung: Secondary | ICD-10-CM | POA: Insufficient documentation

## 2021-10-21 DIAGNOSIS — L598 Other specified disorders of the skin and subcutaneous tissue related to radiation: Secondary | ICD-10-CM | POA: Diagnosis not present

## 2021-10-21 DIAGNOSIS — Z7952 Long term (current) use of systemic steroids: Secondary | ICD-10-CM | POA: Insufficient documentation

## 2021-10-21 DIAGNOSIS — R634 Abnormal weight loss: Secondary | ICD-10-CM | POA: Diagnosis not present

## 2021-10-21 DIAGNOSIS — R5383 Other fatigue: Secondary | ICD-10-CM | POA: Diagnosis not present

## 2021-10-21 DIAGNOSIS — G4700 Insomnia, unspecified: Secondary | ICD-10-CM | POA: Insufficient documentation

## 2021-10-21 DIAGNOSIS — C7931 Secondary malignant neoplasm of brain: Secondary | ICD-10-CM | POA: Insufficient documentation

## 2021-10-21 DIAGNOSIS — R197 Diarrhea, unspecified: Secondary | ICD-10-CM | POA: Insufficient documentation

## 2021-10-21 DIAGNOSIS — Z7982 Long term (current) use of aspirin: Secondary | ICD-10-CM | POA: Insufficient documentation

## 2021-10-21 DIAGNOSIS — R1310 Dysphagia, unspecified: Secondary | ICD-10-CM | POA: Diagnosis not present

## 2021-10-21 DIAGNOSIS — Z510 Encounter for antineoplastic radiation therapy: Secondary | ICD-10-CM | POA: Insufficient documentation

## 2021-10-21 DIAGNOSIS — R21 Rash and other nonspecific skin eruption: Secondary | ICD-10-CM | POA: Insufficient documentation

## 2021-10-21 DIAGNOSIS — Z923 Personal history of irradiation: Secondary | ICD-10-CM | POA: Diagnosis not present

## 2021-10-21 LAB — RAD ONC ARIA SESSION SUMMARY
Course Elapsed Days: 32
Plan Fractions Treated to Date: 23
Plan Prescribed Dose Per Fraction: 2 Gy
Plan Total Fractions Prescribed: 33
Plan Total Prescribed Dose: 66 Gy
Reference Point Dosage Given to Date: 46 Gy
Reference Point Session Dosage Given: 2 Gy
Session Number: 23

## 2021-10-22 ENCOUNTER — Other Ambulatory Visit: Payer: Self-pay

## 2021-10-22 ENCOUNTER — Ambulatory Visit
Admission: RE | Admit: 2021-10-22 | Discharge: 2021-10-22 | Disposition: A | Discharge status: Home / Self Care | Payer: BC Managed Care – PPO | Source: Ambulatory Visit | Attending: Radiation Oncology | Admitting: Radiation Oncology

## 2021-10-22 DIAGNOSIS — C3412 Malignant neoplasm of upper lobe, left bronchus or lung: Secondary | ICD-10-CM | POA: Diagnosis not present

## 2021-10-22 LAB — RAD ONC ARIA SESSION SUMMARY
Course Elapsed Days: 33
Plan Fractions Treated to Date: 24
Plan Prescribed Dose Per Fraction: 2 Gy
Plan Total Fractions Prescribed: 33
Plan Total Prescribed Dose: 66 Gy
Reference Point Dosage Given to Date: 48 Gy
Reference Point Session Dosage Given: 2 Gy
Session Number: 24

## 2021-10-23 ENCOUNTER — Other Ambulatory Visit: Payer: Self-pay

## 2021-10-23 ENCOUNTER — Ambulatory Visit
Admission: RE | Admit: 2021-10-23 | Discharge: 2021-10-23 | Disposition: A | Discharge status: Home / Self Care | Payer: BC Managed Care – PPO | Source: Ambulatory Visit | Attending: Radiation Oncology | Admitting: Radiation Oncology

## 2021-10-23 DIAGNOSIS — C3412 Malignant neoplasm of upper lobe, left bronchus or lung: Secondary | ICD-10-CM | POA: Diagnosis not present

## 2021-10-23 LAB — RAD ONC ARIA SESSION SUMMARY
Course Elapsed Days: 34
Plan Fractions Treated to Date: 25
Plan Prescribed Dose Per Fraction: 2 Gy
Plan Total Fractions Prescribed: 33
Plan Total Prescribed Dose: 66 Gy
Reference Point Dosage Given to Date: 50 Gy
Reference Point Session Dosage Given: 2 Gy
Session Number: 25

## 2021-10-24 ENCOUNTER — Ambulatory Visit
Admission: RE | Admit: 2021-10-24 | Discharge: 2021-10-24 | Disposition: A | Discharge status: Home / Self Care | Payer: BC Managed Care – PPO | Source: Ambulatory Visit | Attending: Radiation Oncology | Admitting: Radiation Oncology

## 2021-10-24 ENCOUNTER — Other Ambulatory Visit: Payer: Self-pay

## 2021-10-24 DIAGNOSIS — C3412 Malignant neoplasm of upper lobe, left bronchus or lung: Secondary | ICD-10-CM | POA: Diagnosis not present

## 2021-10-24 LAB — RAD ONC ARIA SESSION SUMMARY
Course Elapsed Days: 35
Plan Fractions Treated to Date: 26
Plan Prescribed Dose Per Fraction: 2 Gy
Plan Total Fractions Prescribed: 33
Plan Total Prescribed Dose: 66 Gy
Reference Point Dosage Given to Date: 52 Gy
Reference Point Session Dosage Given: 2 Gy
Session Number: 26

## 2021-10-25 ENCOUNTER — Other Ambulatory Visit: Payer: Self-pay

## 2021-10-25 ENCOUNTER — Ambulatory Visit
Admission: RE | Admit: 2021-10-25 | Discharge: 2021-10-25 | Disposition: A | Discharge status: Home / Self Care | Payer: BC Managed Care – PPO | Source: Ambulatory Visit | Attending: Radiation Oncology | Admitting: Radiation Oncology

## 2021-10-25 DIAGNOSIS — C3412 Malignant neoplasm of upper lobe, left bronchus or lung: Secondary | ICD-10-CM | POA: Diagnosis not present

## 2021-10-25 LAB — RAD ONC ARIA SESSION SUMMARY
Course Elapsed Days: 36
Plan Fractions Treated to Date: 27
Plan Prescribed Dose Per Fraction: 2 Gy
Plan Total Fractions Prescribed: 33
Plan Total Prescribed Dose: 66 Gy
Reference Point Dosage Given to Date: 54 Gy
Reference Point Session Dosage Given: 2 Gy
Session Number: 27

## 2021-10-28 ENCOUNTER — Other Ambulatory Visit: Payer: Self-pay

## 2021-10-28 ENCOUNTER — Inpatient Hospital Stay: Payer: BC Managed Care – PPO | Attending: Hematology

## 2021-10-28 ENCOUNTER — Ambulatory Visit
Admission: RE | Admit: 2021-10-28 | Discharge: 2021-10-28 | Disposition: A | Discharge status: Home / Self Care | Payer: BC Managed Care – PPO | Source: Ambulatory Visit | Attending: Radiation Oncology | Admitting: Radiation Oncology

## 2021-10-28 ENCOUNTER — Inpatient Hospital Stay (HOSPITAL_BASED_OUTPATIENT_CLINIC_OR_DEPARTMENT_OTHER): Payer: BC Managed Care – PPO | Admitting: Hematology

## 2021-10-28 VITALS — BP 125/59 | HR 79 | Temp 98.0°F | Resp 19 | Ht 60.0 in | Wt 254.0 lb

## 2021-10-28 DIAGNOSIS — C7931 Secondary malignant neoplasm of brain: Secondary | ICD-10-CM | POA: Diagnosis not present

## 2021-10-28 DIAGNOSIS — C3412 Malignant neoplasm of upper lobe, left bronchus or lung: Secondary | ICD-10-CM

## 2021-10-28 LAB — RAD ONC ARIA SESSION SUMMARY
Course Elapsed Days: 39
Plan Fractions Treated to Date: 28
Plan Prescribed Dose Per Fraction: 2 Gy
Plan Total Fractions Prescribed: 33
Plan Total Prescribed Dose: 66 Gy
Reference Point Dosage Given to Date: 56 Gy
Reference Point Session Dosage Given: 2 Gy
Session Number: 28

## 2021-10-28 LAB — CBC WITH DIFFERENTIAL (CANCER CENTER ONLY)
Abs Immature Granulocytes: 0 10*3/uL (ref 0.00–0.07)
Basophils Absolute: 0 10*3/uL (ref 0.0–0.1)
Basophils Relative: 0 %
Eosinophils Absolute: 0 10*3/uL (ref 0.0–0.5)
Eosinophils Relative: 0 %
HCT: 38.4 % (ref 36.0–46.0)
Hemoglobin: 13 g/dL (ref 12.0–15.0)
Immature Granulocytes: 0 %
Lymphocytes Relative: 15 %
Lymphs Abs: 0.5 10*3/uL — ABNORMAL LOW (ref 0.7–4.0)
MCH: 27.1 pg (ref 26.0–34.0)
MCHC: 33.9 g/dL (ref 30.0–36.0)
MCV: 80 fL (ref 80.0–100.0)
Monocytes Absolute: 0.5 10*3/uL (ref 0.1–1.0)
Monocytes Relative: 14 %
Neutro Abs: 2.4 10*3/uL (ref 1.7–7.7)
Neutrophils Relative %: 71 %
Platelet Count: 170 10*3/uL (ref 150–400)
RBC: 4.8 MIL/uL (ref 3.87–5.11)
RDW: 14.5 % (ref 11.5–15.5)
WBC Count: 3.5 10*3/uL — ABNORMAL LOW (ref 4.0–10.5)
nRBC: 0 % (ref 0.0–0.2)

## 2021-10-28 LAB — CMP (CANCER CENTER ONLY)
ALT: 24 U/L (ref 0–44)
AST: 25 U/L (ref 15–41)
Albumin: 4.5 g/dL (ref 3.5–5.0)
Alkaline Phosphatase: 81 U/L (ref 38–126)
Anion gap: 9 (ref 5–15)
BUN: 20 mg/dL (ref 6–20)
CO2: 25 mmol/L (ref 22–32)
Calcium: 9.9 mg/dL (ref 8.9–10.3)
Chloride: 103 mmol/L (ref 98–111)
Creatinine: 1.54 mg/dL — ABNORMAL HIGH (ref 0.44–1.00)
GFR, Estimated: 40 mL/min — ABNORMAL LOW (ref >60)
Glucose, Bld: 140 mg/dL — ABNORMAL HIGH (ref 70–99)
Potassium: 4.4 mmol/L (ref 3.5–5.1)
Sodium: 137 mmol/L (ref 135–145)
Total Bilirubin: 0.5 mg/dL (ref 0.3–1.2)
Total Protein: 8.1 g/dL (ref 6.5–8.1)

## 2021-10-28 NOTE — Progress Notes (Addendum)
?[facility]   ?[phone] [phone]   ?Clinic Follow up Note  ? ?Patient Care Team: ?Amie Critchley as PCP - General (Family Medicine) ?Valrie Hart, RN as Oncology Nurse Navigator (Oncology) ? ?Date of Service:  10/28/2021 ? ?CHIEF COMPLAINT: f/u of metastatic lung cancer ? ?CURRENT THERAPY:  ?Tagrisso, 80 mg daily, started 05/22/21 ?-Radiation to chest, started 09/19/21 ? ?ASSESSMENT & PLAN:  ?Abigail Golden is a 56 y.o. female with  ? ?1. 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 Brain MRI showed multifocal metastatic disease. ?-staging CT CAP 04/23/21 showed: 4.5 cm LUL mass that abuts pleural surface; no pathologically enlarged lymph nodes or metastatic disease. ?-bronchoscopy on 04/24/21 under Dr. Valeta Harms showed malignant cells consistent with adenocarcinoma, insufficient tissue for molecular studies. Guardant 360 was obtained and showed EGFR L858R mutation, which predicts good response to EGFR inhibitor. ?-PET scan 05/13/21 showed LUL mass extending along superior mediastinal border from hilum towards apex.  No lymph node or other distant metastasis. ?-she started Tagrisso on 05/22/21. She tolerates moderately well with mucositis and skin toxicities.  ?-PET scan on 08/28/21 showed: similar size and metabolic activity to primary left lung mass; new hypermetabolic ipsilateral nodal metastasis to prevascular nodal space, left lower paratracheal nodal station, and left supraclavicular.  ?-she was referred back to Dr. Tammi Klippel and began concurrent thoracic radiation therapy on 09/19/21. She is scheduled for 6.5 weeks of radiation, last dose on 11/04/21.  She is tolerating moderately well, with moderate esophagitis and fatigue. ?-she will continue tagrisso. Labs reviewed, CBC overall stable  to improved. CMP shows increase in creatinine to 1.54; I discussed she is dehydrated due to the esophagitis. I recommend IVF this week. ?  ?2. Symptom Management: Fatigue, Odynophagia, Low appetite causing weight loss ?-secondary to treatment ?-she reports issues sleeping. I called in [place] for her on 09/04/21. ?-she has developed odynophagia, which has caused low appetite and thus weight loss. She was prescribed carafate, hycet (liquid), and diflucan by rad onc. ?-she has lost 23 lbs in the last 4 weeks. I advised her to try to supplement with Ensure or Boost. We will give her IVF this week and in 2 weeks. ?  ?3. Brain Metastases ?-brain MRI 04/23/21 showed 2 brain masses with moderate vasogenic edema left parietal lobe and left inferior frontal gyrus.  There were 2 other small metastases in the right frontal gyrus and right posterior temporal lobe and questionable 2 additional punctate metastases in the superior frontal gyrus. ?-she received three fractions of SRS under Dr. Tammi Klippel 11/16-11/21/22. ?-surveillance brain MRI on 08/16/21 revealed two new left frontal lobe metastases measuring 3-4 mm and decreased size or resolution of treated metastases. ?-she received one fraction of SRS to the new metastases on 08/27/21. ?-she is scheduled for f/u brain MRI on 11/28/21. ?  ?4. 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 [name] is now 26 and the patient lives with her and she helps the patient due to her recent diagnosis. 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  ?-she is in touch with her father, [name], but [name] describes him as being unable to fully comprehend her situation. ?-The patient has strong social support at church and has several people that can drive her to appointments. ?-she is currently on short-term disability. They had  questions about Medicaid or Medicare today. I will have our social worker reach out to them with recommendations and instructions to apply. ?   ?5. Goal of care discussion  ?-We previously discussed the incurable nature of her cancer, and the overall poor prognosis, especially if she does not have good response to chemotherapy or progress on chemo ?-The patient understands the goal of care is palliative. ?-I recommend DNR/DNI, she has already filled out a living will and has named [name] her HCPOA. She verbally agreed again on 09/04/21. ?-per her friend [name], she is not able to offer her living with her for ,much longer due to her travel. Pt's father is willing to come and live with pt if they are able to clean her house.  We will refer her to social worker, to discuss Medicaid and disability application ?  ?  ?Plan:  ?-continue daily radiation through 5/15 ?-continue Tagrisso 53m daily ?-2hr IVF this week around her radiation appointment in next few days  ?-repeat lab next week ?[facility] ?-lab and f/u with me in 5-6 weeks ?-Social worker referral ?-Urgent nutritional consult for her weight loss from esophagitis ? ? ?No problem-specific Assessment & Plan notes found for this encounter. ? ? ?SUMMARY OF ONCOLOGIC HISTORY: ?Oncology History Overview Note  ? Cancer Staging  ?[place] lobe of left lung [place] ?Staging form: Lung, AJCC 8th Edition ?- Clinical stage from 04/24/2021: Stage IVA (cT2, cN2, pM1b) - Signed by FTruitt Merle MD on 05/14/2021 ? ?  ?[place] adenocarcinoma [place] lobe of left lung [place]  ?04/22/2021 Imaging  ? CT HEAD WO CONTRAST  ? ?IMPRESSION: ?Two separate areas of vasogenic edema within the left hemisphere, 1 within the inferior frontal region in the other at the left parietal vertex. Metastatic disease would be the most likely cause of this appearance. Other possibilities include venous infarctions and cerebritis. MRI with contrast recommended. ? ?  ?04/23/2021 Imaging  ? MR Brain W and Wo Contrast  ?IMPRESSION: ?1. Metastatic disease to the brain. ?Two enhancing brain masses  with moderate associated vasogenic edema: solid 16 mm left parietal lobe mass, and a larger 29 mm rim enhancing cystic or necrotic mass in the left inferior frontal ?gyrus. Two other small 2-3 mm metastases in the right inferior frontal gyrus, right posterior temporal lobe. And questionable two additional punctate metastases in both superior frontal gyri abutting the falx (series 18, image 46). ?2. No significant intracranial mass effect. No other intracranial ?abnormality. ?  ?04/23/2021 Imaging  ? CT Chest W Contrast  ?IMPRESSION: ?4.5 cm spiculated left upper lobe mass most compatible with primary lung cancer. This abuts the medial pleural surface. A few small adjacent pre-vascular mediastinal lymph nodes, none pathologically enlarged. Recommend cardiothoracic surgical and oncologic consultation. ?  ?No acute findings or evidence of metastatic disease in the abdomen or pelvis. ?  ?04/24/2021 Cancer Staging  ? Staging form: Lung, AJCC 8th Edition ?- Clinical stage from 04/24/2021: Stage IVA (cT2, cN2, pM1b) - Signed by FTruitt Merle MD on 05/14/2021 ? ?  ?04/24/2021 Initial Biopsy  ? FINAL MICROSCOPIC DIAGNOSIS:  ? ?A. LUNG, LUL, FINE NEEDLE ASPIRATION:  ?- Malignant cells present, consistent with adenocarcinoma  ? ?B. LUNG, LUL, BRUSHING:  ?- Malignant cells consistent with adenocarcinoma, see comment  ? ? ?COMMENT:  ?B.  Immunohistochemical stains show that the tumor cells are positive for TTF-1 while they are negative for p63 and CK5/6, consistent with above  interpretation. The number of tumor cells appears to be insufficient for molecular studies. ?  ?05/03/2021 Initial Diagnosis  ? Lung cancer ([place]) ?  ?05/08/2021 - 05/13/2021 Radiation Therapy  ? SRS treatment to the metastatic brain lesions under the care of Dr. Tammi Klippel  ?  ?08/05/2021 Imaging  ? EXAM: ?CT CHEST, ABDOMEN, AND PELVIS WITH CONTRAST ? ?IMPRESSION: ?1. No substantial change in size of the left upper lobe pulmonary ?lesion. ?2. Borderline to mildly  enlarged adjacent prevascular/AP window ?lymph nodes have increased in size in the interval. This finding ?raises concern for disease progression. ?3. Interval development of upper normal bilateral common il

## 2021-10-29 ENCOUNTER — Other Ambulatory Visit: Payer: Self-pay

## 2021-10-29 ENCOUNTER — Encounter: Payer: Self-pay | Admitting: Hematology

## 2021-10-29 ENCOUNTER — Ambulatory Visit
Admission: RE | Admit: 2021-10-29 | Discharge: 2021-10-29 | Disposition: A | Discharge status: Home / Self Care | Payer: BC Managed Care – PPO | Source: Ambulatory Visit | Attending: Radiation Oncology | Admitting: Radiation Oncology

## 2021-10-29 ENCOUNTER — Inpatient Hospital Stay: Payer: BC Managed Care – PPO

## 2021-10-29 ENCOUNTER — Inpatient Hospital Stay: Payer: BC Managed Care – PPO | Admitting: Dietician

## 2021-10-29 VITALS — BP 130/65 | HR 70 | Temp 98.0°F | Resp 18

## 2021-10-29 DIAGNOSIS — R918 Other nonspecific abnormal finding of lung field: Secondary | ICD-10-CM

## 2021-10-29 DIAGNOSIS — C3412 Malignant neoplasm of upper lobe, left bronchus or lung: Secondary | ICD-10-CM | POA: Diagnosis not present

## 2021-10-29 DIAGNOSIS — C7931 Secondary malignant neoplasm of brain: Secondary | ICD-10-CM

## 2021-10-29 DIAGNOSIS — T451X5A Adverse effect of antineoplastic and immunosuppressive drugs, initial encounter: Secondary | ICD-10-CM

## 2021-10-29 DIAGNOSIS — E860 Dehydration: Secondary | ICD-10-CM

## 2021-10-29 LAB — RAD ONC ARIA SESSION SUMMARY
Course Elapsed Days: 40
Plan Fractions Treated to Date: 29
Plan Prescribed Dose Per Fraction: 2 Gy
Plan Total Fractions Prescribed: 33
Plan Total Prescribed Dose: 66 Gy
Reference Point Dosage Given to Date: 58 Gy
Reference Point Session Dosage Given: 2 Gy
Session Number: 29

## 2021-10-29 MED ORDER — ONDANSETRON HCL 4 MG/2ML IJ SOLN
8.0000 mg | Freq: Once | INTRAMUSCULAR | Status: AC
  Administered 2021-10-29: 8 mg via INTRAVENOUS
  Filled 2021-10-29: qty 4

## 2021-10-29 MED ORDER — SODIUM CHLORIDE 0.9 % IV SOLN: Freq: Once | INTRAVENOUS | Status: DC

## 2021-10-29 MED ORDER — SODIUM CHLORIDE 0.9 % IV SOLN: Freq: Once | INTRAVENOUS | Status: AC

## 2021-10-29 NOTE — Progress Notes (Signed)
Patient started having an anxiety attack during IV start, stating she was about to pass out. She began dry heaving and was hot and clammy. IV start was stopped and patient was provided with a cool cloth. Patient was provided with time and breath techniques were taught. Patient recovered and IV was successfully started. IV Zofran released from supportive treatment plan.  ?

## 2021-10-29 NOTE — Progress Notes (Signed)
Nutrition Assessment ? ? ?Reason for Assessment: MD referral (radiation induced esophagitis)  ? ? ?ASSESSMENT: 56 year old female with lnon-small cell ung cancer metastatic to brain. Patient is currently receiving concurrent thoracic radiation therapy to thoracic (started 09/19/21, final radiation planned 5/15) and oral chemotherapy with Tagrisso. Patient is under the care of Dr. Burr Medico.  ? ?Met with patient during infusion. She is receiving IV fluids today. Patient nurse informed of patient having anxiety attack and nausea prior to visit. Patient taught breathing techniques and IV zofran. She is resting at time of visit. Patient wakes easily, intermittent short periods of sleep. She reports ongoing nausea and severe pain in her throat. This is worse if she coughs or gags. She is using carafate. This helps some. Patient is using Hycet at night before bed. Patient reports she lives alone and eating foods that are easy to prepare. Patient unable to provide recall, reports eating things such as applesauce, soups, mashed potatoes, oatmeal, instant grits. She likes ice cream, but painful to swallow if too cold. Patient does not like Ensure. She is tolerating Boost HP as these taste more like chocolate milk.  ? ?Nutrition Focused Physical Exam: Unable to complete full exam due to nausea, intermittently falling asleep during visit.  ? ?Orbital Region: moderate ?Buccal Region: moderate ?Temple Region: moderate ?Dorsal Hand: mild ?Hair: reviewed ?Eyes: reviewed  ?Nails: reviewed ? ? ?Medications: diflucan, hycet, carafate, zofran, ambien, prednisone, magic mouthwash ? ? ?Labs: 5/8 - glucose 140, Cr 1.54 ? ? ?Anthropometrics: Patient weighed 277.5 lb on 4/10. Weights have decreased (9%) 23.5 lb in the last month, 13% over the last 5 months; significant  ? ?Height: 5 ?Weight: 254 lb (5/8) ?UBW: 292 lb (06/07/21) ?BMI: 49.61 ? ? ?NUTRITION DIAGNOSIS: Severe malnutrition in the context of chronic illness related to side effects of  radiation therapy as evidenced by odynophagia limiting oral intake, moderate fat/muscle depletion, significant 9% weight loss in one month ? ? ? ?INTERVENTION:  ?Discussed foods that are easy to chew/swallow ane ways to alter texture of foods - handout provided ?Encouraged soft moist high protein foods - handout with ideas provided  ?Continue drinking 2 Boost HP and recommend adding 2 high calorie supplements such as CIB with whole milk - samples given ?Contact information given  ? ? ?MONITORING, EVALUATION, GOAL: weight trends, intake  ? ? ?Next Visit: ~2 weeks via telephone ? ? ? ? ? ? ?

## 2021-10-29 NOTE — Patient Instructions (Signed)
Rehydration, Adult Rehydration is the replacement of body fluids, salts, and minerals (electrolytes) that are lost during dehydration. Dehydration is when there is not enough water or other fluids in the body. This happens when you lose more fluids than you take in. Common causes of dehydration include: Not drinking enough fluids. This can occur when you are ill or doing activities that require a lot of energy, especially in hot weather. Conditions that cause loss of water or other fluids, such as diarrhea, vomiting, sweating, or urinating a lot. Other illnesses, such as fever or infection. Certain medicines, such as those that remove excess fluid from the body (diuretics). Symptoms of mild or moderate dehydration may include thirst, dry lips and mouth, and dizziness. Symptoms of severe dehydration may include increased heart rate, confusion, fainting, and not urinating. For severe dehydration, you may need to get fluids through an IV at the hospital. For mild or moderate dehydration, you can usually rehydrate at home by drinking certain fluids as told by your health care provider. What are the risks? Generally, rehydration is safe. However, taking in too much fluid (overhydration) can be a problem. This is rare. Overhydration can cause an electrolyte imbalance, kidney failure, or a decrease in salt (sodium) levels in the body. Supplies needed You will need an oral rehydration solution (ORS) if your health care provider tells you to use one. This is a drink to treat dehydration. It can be found in pharmacies and retail stores. How to rehydrate Fluids Follow instructions from your health care provider for rehydration. The kind of fluid and the amount you should drink depend on your condition. In general, you should choose drinks that you prefer. If told by your health care provider, drink an ORS. Make an ORS by following instructions on the package. Start by drinking small amounts, about  cup (120  mL) every 5-10 minutes. Slowly increase how much you drink until you have taken the amount recommended by your health care provider. Drink enough clear fluids to keep your urine pale yellow. If you were told to drink an ORS, finish it first, then start slowly drinking other clear fluids. Drink fluids such as: Water. This includes sparkling water and flavored water. Drinking only water can lead to having too little sodium in your body (hyponatremia). Follow the advice of your health care provider. Water from ice chips you suck on. Fruit juice with water you add to it (diluted). Sports drinks. Hot or cold herbal teas. Broth-based soups. Milk or milk products. Food Follow instructions from your health care provider about what to eat while you rehydrate. Your health care provider may recommend that you slowly begin eating regular foods in small amounts. Eat foods that contain a healthy balance of electrolytes, such as bananas, oranges, potatoes, tomatoes, and spinach. Avoid foods that are greasy or contain a lot of sugar. In some cases, you may get nutrition through a feeding tube that is passed through your nose and into your stomach (nasogastric tube, or NG tube). This may be done if you have uncontrolled vomiting or diarrhea. Beverages to avoid  Certain beverages may make dehydration worse. While you rehydrate, avoid drinking alcohol. How to tell if you are recovering from dehydration You may be recovering from dehydration if: You are urinating more often than before you started rehydrating. Your urine is pale yellow. Your energy level improves. You vomit less frequently. You have diarrhea less frequently. Your appetite improves or returns to normal. You feel less dizzy or less light-headed.   Your skin tone and color start to look more normal. Follow these instructions at home: Take over-the-counter and prescription medicines only as told by your health care provider. Do not take sodium  tablets. Doing this can lead to having too much sodium in your body (hypernatremia). Contact a health care provider if: You continue to have symptoms of mild or moderate dehydration, such as: Thirst. Dry lips. Slightly dry mouth. Dizziness. Dark urine or less urine than normal. Muscle cramps. You continue to vomit or have diarrhea. Get help right away if you: Have symptoms of dehydration that get worse. Have a fever. Have a severe headache. Have been vomiting and the following happens: Your vomiting gets worse or does not go away. Your vomit includes blood or green matter (bile). You cannot eat or drink without vomiting. Have problems with urination or bowel movements, such as: Diarrhea that gets worse or does not go away. Blood in your stool (feces). This may cause stool to look black and tarry. Not urinating, or urinating only a small amount of very dark urine, within 6-8 hours. Have trouble breathing. Have symptoms that get worse with treatment. These symptoms may represent a serious problem that is an emergency. Do not wait to see if the symptoms will go away. Get medical help right away. Call your local emergency services (911 in the [place] Do not drive yourself to the hospital. Summary Rehydration is the replacement of body fluids and minerals (electrolytes) that are lost during dehydration. Follow instructions from your health care provider for rehydration. The kind of fluid and amount you should drink depend on your condition. Slowly increase how much you drink until you have taken the amount recommended by your health care provider. Contact your health care provider if you continue to show signs of mild or moderate dehydration. This information is not intended to replace advice given to you by your health care provider. Make sure you discuss any questions you have with your health care provider. Document Revised: 08/10/2019 Document Reviewed: 06/20/2019 Elsevier Patient  Education  2023 [employer]  

## 2021-10-30 ENCOUNTER — Ambulatory Visit
Admission: RE | Admit: 2021-10-30 | Discharge: 2021-10-30 | Disposition: A | Discharge status: Home / Self Care | Payer: BC Managed Care – PPO | Source: Ambulatory Visit | Attending: Radiation Oncology | Admitting: Radiation Oncology

## 2021-10-30 ENCOUNTER — Other Ambulatory Visit: Payer: Self-pay

## 2021-10-30 DIAGNOSIS — C3412 Malignant neoplasm of upper lobe, left bronchus or lung: Secondary | ICD-10-CM | POA: Diagnosis not present

## 2021-10-30 LAB — RAD ONC ARIA SESSION SUMMARY
Course Elapsed Days: 41
Plan Fractions Treated to Date: 30
Plan Prescribed Dose Per Fraction: 2 Gy
Plan Total Fractions Prescribed: 33
Plan Total Prescribed Dose: 66 Gy
Reference Point Dosage Given to Date: 60 Gy
Reference Point Session Dosage Given: 2 Gy
Session Number: 30

## 2021-10-31 ENCOUNTER — Ambulatory Visit
Admission: RE | Admit: 2021-10-31 | Discharge: 2021-10-31 | Disposition: A | Discharge status: Home / Self Care | Payer: BC Managed Care – PPO | Source: Ambulatory Visit | Attending: Radiation Oncology | Admitting: Radiation Oncology

## 2021-10-31 ENCOUNTER — Other Ambulatory Visit: Payer: Self-pay

## 2021-10-31 ENCOUNTER — Encounter: Payer: Self-pay | Admitting: Licensed Clinical Social Worker

## 2021-10-31 DIAGNOSIS — C3412 Malignant neoplasm of upper lobe, left bronchus or lung: Secondary | ICD-10-CM

## 2021-10-31 LAB — RAD ONC ARIA SESSION SUMMARY
Course Elapsed Days: 42
Plan Fractions Treated to Date: 31
Plan Prescribed Dose Per Fraction: 2 Gy
Plan Total Fractions Prescribed: 33
Plan Total Prescribed Dose: 66 Gy
Reference Point Dosage Given to Date: 62 Gy
Reference Point Session Dosage Given: 2 Gy
Session Number: 31

## 2021-10-31 NOTE — Progress Notes (Signed)
[facility] CSW Progress Note ? ?Clinical Social Worker  left multiple voicemails for patient in reference  to referral received from the medical team requesting assistance w/ applying for disability and Medicaid.  CSW to assess once patient makes contact. ? ? ? ?Henriette Combs, LCSW  ?

## 2021-11-01 ENCOUNTER — Other Ambulatory Visit: Payer: Self-pay

## 2021-11-01 ENCOUNTER — Telehealth: Payer: Self-pay | Admitting: Hematology

## 2021-11-01 ENCOUNTER — Ambulatory Visit
Admission: RE | Admit: 2021-11-01 | Discharge: 2021-11-01 | Disposition: A | Discharge status: Home / Self Care | Payer: BC Managed Care – PPO | Source: Ambulatory Visit | Attending: Radiation Oncology | Admitting: Radiation Oncology

## 2021-11-01 DIAGNOSIS — C3412 Malignant neoplasm of upper lobe, left bronchus or lung: Secondary | ICD-10-CM | POA: Diagnosis not present

## 2021-11-01 LAB — RAD ONC ARIA SESSION SUMMARY
Course Elapsed Days: 43
Plan Fractions Treated to Date: 32
Plan Prescribed Dose Per Fraction: 2 Gy
Plan Total Fractions Prescribed: 33
Plan Total Prescribed Dose: 66 Gy
Reference Point Dosage Given to Date: 64 Gy
Reference Point Session Dosage Given: 2 Gy
Session Number: 32

## 2021-11-01 NOTE — Telephone Encounter (Signed)
Unable to leave message with follow-up appointments per 5/8 los. Patient is Mychart active. ?

## 2021-11-04 ENCOUNTER — Ambulatory Visit
Admission: RE | Admit: 2021-11-04 | Discharge: 2021-11-04 | Disposition: A | Discharge status: Home / Self Care | Payer: BC Managed Care – PPO | Source: Ambulatory Visit | Attending: Radiation Oncology | Admitting: Radiation Oncology

## 2021-11-04 ENCOUNTER — Inpatient Hospital Stay: Payer: BC Managed Care – PPO

## 2021-11-04 ENCOUNTER — Other Ambulatory Visit: Payer: Self-pay

## 2021-11-04 ENCOUNTER — Encounter: Payer: Self-pay | Admitting: Urology

## 2021-11-04 DIAGNOSIS — C3412 Malignant neoplasm of upper lobe, left bronchus or lung: Secondary | ICD-10-CM | POA: Diagnosis not present

## 2021-11-04 LAB — CMP (CANCER CENTER ONLY)
ALT: 21 U/L (ref 0–44)
AST: 22 U/L (ref 15–41)
Albumin: 4.4 g/dL (ref 3.5–5.0)
Alkaline Phosphatase: 84 U/L (ref 38–126)
Anion gap: 9 (ref 5–15)
BUN: 16 mg/dL (ref 6–20)
CO2: 24 mmol/L (ref 22–32)
Calcium: 9.7 mg/dL (ref 8.9–10.3)
Chloride: 104 mmol/L (ref 98–111)
Creatinine: 1.4 mg/dL — ABNORMAL HIGH (ref 0.44–1.00)
GFR, Estimated: 45 mL/min — ABNORMAL LOW (ref >60)
Glucose, Bld: 136 mg/dL — ABNORMAL HIGH (ref 70–99)
Potassium: 4 mmol/L (ref 3.5–5.1)
Sodium: 137 mmol/L (ref 135–145)
Total Bilirubin: 0.5 mg/dL (ref 0.3–1.2)
Total Protein: 7.7 g/dL (ref 6.5–8.1)

## 2021-11-04 LAB — RAD ONC ARIA SESSION SUMMARY
Course Elapsed Days: 46
Plan Fractions Treated to Date: 33
Plan Prescribed Dose Per Fraction: 2 Gy
Plan Total Fractions Prescribed: 33
Plan Total Prescribed Dose: 66 Gy
Reference Point Dosage Given to Date: 66 Gy
Reference Point Session Dosage Given: 2 Gy
Session Number: 33

## 2021-11-04 LAB — CBC WITH DIFFERENTIAL (CANCER CENTER ONLY)
Abs Immature Granulocytes: 0.01 10*3/uL (ref 0.00–0.07)
Basophils Absolute: 0 10*3/uL (ref 0.0–0.1)
Basophils Relative: 0 %
Eosinophils Absolute: 0 10*3/uL (ref 0.0–0.5)
Eosinophils Relative: 0 %
HCT: 38.6 % (ref 36.0–46.0)
Hemoglobin: 13 g/dL (ref 12.0–15.0)
Immature Granulocytes: 0 %
Lymphocytes Relative: 11 %
Lymphs Abs: 0.5 10*3/uL — ABNORMAL LOW (ref 0.7–4.0)
MCH: 27 pg (ref 26.0–34.0)
MCHC: 33.7 g/dL (ref 30.0–36.0)
MCV: 80.1 fL (ref 80.0–100.0)
Monocytes Absolute: 0.6 10*3/uL (ref 0.1–1.0)
Monocytes Relative: 13 %
Neutro Abs: 3.5 10*3/uL (ref 1.7–7.7)
Neutrophils Relative %: 76 %
Platelet Count: 163 10*3/uL (ref 150–400)
RBC: 4.82 MIL/uL (ref 3.87–5.11)
RDW: 14.7 % (ref 11.5–15.5)
WBC Count: 4.6 10*3/uL (ref 4.0–10.5)
nRBC: 0 % (ref 0.0–0.2)

## 2021-11-08 ENCOUNTER — Other Ambulatory Visit: Payer: Self-pay | Admitting: Hematology

## 2021-11-11 ENCOUNTER — Inpatient Hospital Stay: Payer: BC Managed Care – PPO

## 2021-11-11 ENCOUNTER — Other Ambulatory Visit: Payer: Self-pay

## 2021-11-11 ENCOUNTER — Inpatient Hospital Stay (HOSPITAL_BASED_OUTPATIENT_CLINIC_OR_DEPARTMENT_OTHER): Payer: BC Managed Care – PPO | Admitting: Physician Assistant

## 2021-11-11 ENCOUNTER — Other Ambulatory Visit (HOSPITAL_COMMUNITY): Payer: Self-pay

## 2021-11-11 VITALS — BP 151/91 | HR 91 | Temp 97.9°F | Resp 18 | Wt 244.3 lb

## 2021-11-11 VITALS — BP 137/63 | HR 76 | Resp 16

## 2021-11-11 DIAGNOSIS — E860 Dehydration: Secondary | ICD-10-CM | POA: Diagnosis not present

## 2021-11-11 DIAGNOSIS — C7931 Secondary malignant neoplasm of brain: Secondary | ICD-10-CM

## 2021-11-11 DIAGNOSIS — C3490 Malignant neoplasm of unspecified part of unspecified bronchus or lung: Secondary | ICD-10-CM | POA: Diagnosis not present

## 2021-11-11 DIAGNOSIS — C3412 Malignant neoplasm of upper lobe, left bronchus or lung: Secondary | ICD-10-CM | POA: Diagnosis not present

## 2021-11-11 DIAGNOSIS — R918 Other nonspecific abnormal finding of lung field: Secondary | ICD-10-CM

## 2021-11-11 DIAGNOSIS — K209 Esophagitis, unspecified without bleeding: Secondary | ICD-10-CM | POA: Diagnosis not present

## 2021-11-11 DIAGNOSIS — T451X5A Adverse effect of antineoplastic and immunosuppressive drugs, initial encounter: Secondary | ICD-10-CM

## 2021-11-11 LAB — CMP (CANCER CENTER ONLY)
ALT: 25 U/L (ref 0–44)
AST: 24 U/L (ref 15–41)
Albumin: 4.5 g/dL (ref 3.5–5.0)
Alkaline Phosphatase: 79 U/L (ref 38–126)
Anion gap: 10 (ref 5–15)
BUN: 20 mg/dL (ref 6–20)
CO2: 24 mmol/L (ref 22–32)
Calcium: 10.1 mg/dL (ref 8.9–10.3)
Chloride: 104 mmol/L (ref 98–111)
Creatinine: 1.72 mg/dL — ABNORMAL HIGH (ref 0.44–1.00)
GFR, Estimated: 35 mL/min — ABNORMAL LOW (ref >60)
Glucose, Bld: 133 mg/dL — ABNORMAL HIGH (ref 70–99)
Potassium: 4 mmol/L (ref 3.5–5.1)
Sodium: 138 mmol/L (ref 135–145)
Total Bilirubin: 0.5 mg/dL (ref 0.3–1.2)
Total Protein: 8.2 g/dL — ABNORMAL HIGH (ref 6.5–8.1)

## 2021-11-11 LAB — CBC WITH DIFFERENTIAL (CANCER CENTER ONLY)
Abs Immature Granulocytes: 0.01 10*3/uL (ref 0.00–0.07)
Basophils Absolute: 0 10*3/uL (ref 0.0–0.1)
Basophils Relative: 0 %
Eosinophils Absolute: 0 10*3/uL (ref 0.0–0.5)
Eosinophils Relative: 0 %
HCT: 35.6 % — ABNORMAL LOW (ref 36.0–46.0)
Hemoglobin: 11.9 g/dL — ABNORMAL LOW (ref 12.0–15.0)
Immature Granulocytes: 0 %
Lymphocytes Relative: 12 %
Lymphs Abs: 0.4 10*3/uL — ABNORMAL LOW (ref 0.7–4.0)
MCH: 27.2 pg (ref 26.0–34.0)
MCHC: 33.4 g/dL (ref 30.0–36.0)
MCV: 81.3 fL (ref 80.0–100.0)
Monocytes Absolute: 0.4 10*3/uL (ref 0.1–1.0)
Monocytes Relative: 14 %
Neutro Abs: 2.4 10*3/uL (ref 1.7–7.7)
Neutrophils Relative %: 74 %
Platelet Count: 148 10*3/uL — ABNORMAL LOW (ref 150–400)
RBC: 4.38 MIL/uL (ref 3.87–5.11)
RDW: 15 % (ref 11.5–15.5)
WBC Count: 3.3 10*3/uL — ABNORMAL LOW (ref 4.0–10.5)
nRBC: 0 % (ref 0.0–0.2)

## 2021-11-11 MED ORDER — HYDROCODONE-ACETAMINOPHEN 7.5-325 MG/15ML PO SOLN: 10.0000 mL | ORAL | 0 refills | Status: DC | PRN

## 2021-11-11 MED ORDER — ONDANSETRON HCL 4 MG/2ML IJ SOLN
8.0000 mg | Freq: Once | INTRAMUSCULAR | Status: AC
  Administered 2021-11-11: 8 mg via INTRAVENOUS
  Filled 2021-11-11: qty 4

## 2021-11-11 MED ORDER — SODIUM CHLORIDE 0.9 % IV SOLN: Freq: Once | INTRAVENOUS | Status: DC

## 2021-11-11 MED ORDER — SODIUM CHLORIDE 0.9 % IV SOLN: Freq: Once | INTRAVENOUS | Status: AC

## 2021-11-11 NOTE — Progress Notes (Signed)
Symptom Management Consult note [place]    Patient Care Team: Aletha Halim., PA-C as PCP - General (Family Medicine) Valrie Hart, RN as Oncology Nurse Navigator (Oncology)    Name of the patient: Abigail Golden  [id number]  [birth year]   Date of visit: 11/11/2021    Chief complaint/ Reason for visit- IVF, poor PO intake  Oncology History Overview Note   Cancer Staging  Primary adenocarcinoma of upper lobe of left lung [facility]) Staging form: Lung, AJCC 8th Edition - Clinical stage from 04/24/2021: Stage IVA (cT2, cN2, pM1b) - Signed by Truitt Merle, MD on 05/14/2021    Primary adenocarcinoma of upper lobe of left lung ([place])  04/22/2021 Imaging   CT HEAD WO CONTRAST   IMPRESSION: Two separate areas of vasogenic edema within the left hemisphere, 1 within the inferior frontal region in the other at the left parietal vertex. Metastatic disease would be the most likely cause of this appearance. Other possibilities include venous infarctions and cerebritis. MRI with contrast recommended.    04/23/2021 Imaging   MR Brain W and Wo Contrast  IMPRESSION: 1. Metastatic disease to the brain. Two enhancing brain masses with moderate associated vasogenic edema: solid 16 mm left parietal lobe mass, and a larger 29 mm rim enhancing cystic or necrotic mass in the left inferior frontal gyrus. Two other small 2-3 mm metastases in the right inferior frontal gyrus, right posterior temporal lobe. And questionable two additional punctate metastases in both superior frontal gyri abutting the falx (series 18, image 46). 2. No significant intracranial mass effect. No other intracranial abnormality.   04/23/2021 Imaging   CT Chest W Contrast  IMPRESSION: 4.5 cm spiculated left upper lobe mass most compatible with primary lung cancer. This abuts the medial pleural surface. A few small adjacent pre-vascular mediastinal lymph nodes, none pathologically enlarged. Recommend  cardiothoracic surgical and oncologic consultation.   No acute findings or evidence of metastatic disease in the abdomen or pelvis.   04/24/2021 Cancer Staging   Staging form: Lung, AJCC 8th Edition - Clinical stage from 04/24/2021: Stage IVA (cT2, cN2, pM1b) - Signed by Truitt Merle, MD on 05/14/2021    04/24/2021 Initial Biopsy   FINAL MICROSCOPIC DIAGNOSIS:   A. LUNG, LUL, FINE NEEDLE ASPIRATION:  - Malignant cells present, consistent with adenocarcinoma   B. LUNG, LUL, BRUSHING:  - Malignant cells consistent with adenocarcinoma, see comment    COMMENT:  B.  Immunohistochemical stains show that the tumor cells are positive for TTF-1 while they are negative for p63 and CK5/6, consistent with above interpretation. The number of tumor cells appears to be insufficient for molecular studies.   05/03/2021 Initial Diagnosis   Lung cancer ([place])   05/08/2021 - 05/13/2021 Radiation Therapy   SRS treatment to the metastatic brain lesions under the care of Dr. Tammi Klippel    08/05/2021 Imaging   EXAM: CT CHEST, ABDOMEN, AND PELVIS WITH CONTRAST  IMPRESSION: 1. No substantial change in size of the left upper lobe pulmonary lesion. 2. Borderline to mildly enlarged adjacent prevascular/AP window lymph nodes have increased in size in the interval. This finding raises concern for disease progression. 3. Interval development of upper normal bilateral common iliac and left external iliac lymph nodes. Close attention on follow-up recommended. 4. No other evidence for metastatic disease in the abdomen or pelvis.     Current Therapy: [place]  Interval history- Abigail Golden is a 56 yo female with oncologic history as above presenting to  [facility] today with chief complaint of needing IVF and poor fluid intake that has been ongoing since she started radiation therapy. Patient recently finished radiation on 11/04/20. She reports a rash on her chest that she first noticed x 5 days ago. She reports the  rash tingles intermittently. She tells me she has been applying ointment to it that was prescribed by radiation providers that helps with the pain. She denies any bleeding or drainage from the rash. She has been trying to increase her fluid intake at home by drinking liquid IV, water, and apple juice. She states she is tolerating eating applesauce and jello. She tried to drink at least 3 bottles of fluids daily. She has been using the liquid pain medication to help her esophagitis and takes it at bedtime which allows her to get sleep. She states her fatigue is getting better every day. She has not had diarrhea in x 1 week. No OTC mediations prior to arrival today. She denies fever, chills, chest pain, shortness of breath, cough, abdominal pain, nausea, urinary symptoms.  ROS  All other systems are reviewed and are negative for acute change except as noted in the HPI.    Allergies  Allergen Reactions   Celebrex [Celecoxib] Rash    On face itching   Minocycline Rash    On face Itching     Past Medical History:  Diagnosis Date   Allergy    Anxiety    Chronic bronchitis ([place])    Depression    Headache    Hypoglycemia    Lower back pain    since fall ~ 2012 (05/25/2015)   Migraine    05/25/2015 "probably once/month"   Migraine headache    Pain in left hip    since fall ~ 2012 (05/25/2015)   Pneumonia "several times"     Past Surgical History:  Procedure Laterality Date   ABDOMINAL HYSTERECTOMY  11/2002   "found benign tumors"   BRONCHIAL BIOPSY  04/24/2021   Procedure: BRONCHIAL BIOPSIES;  Surgeon: Garner Nash, DO;  Location: [facility];  Service: Pulmonary;;   BRONCHIAL BRUSHINGS  04/24/2021   Procedure: BRONCHIAL BRUSHINGS;  Surgeon: Garner Nash, DO;  Location: [place] [facility];  Service: Pulmonary;;   BRONCHIAL NEEDLE ASPIRATION BIOPSY  04/24/2021   Procedure: BRONCHIAL NEEDLE ASPIRATION BIOPSIES;  Surgeon: Garner Nash, DO;  Location: [facility];  Service:  Pulmonary;;   CHEST TUBE INSERTION  04/24/2021   Procedure: CHEST TUBE INSERTION;  Surgeon: Garner Nash, DO;  Location: [place] [facility];  Service: Pulmonary;;   CHOLECYSTECTOMY N/A 05/25/2015   Procedure: LAPAROSCOPIC CHOLECYSTECTOMY;  Surgeon: Ralene Ok, MD;  Location: [place];  Service: General;  Laterality: N/A;   LAPAROSCOPIC CHOLECYSTECTOMY  05/25/2015   MENISCUS REPAIR Right 03/2017   VIDEO BRONCHOSCOPY WITH ENDOBRONCHIAL NAVIGATION N/A 04/24/2021   Procedure: VIDEO BRONCHOSCOPY WITH ENDOBRONCHIAL NAVIGATION;  Surgeon: Garner Nash, DO;  Location: [place];  Service: Pulmonary;  Laterality: N/A;   VIDEO BRONCHOSCOPY WITH RADIAL ENDOBRONCHIAL ULTRASOUND  04/24/2021   Procedure: VIDEO BRONCHOSCOPY WITH RADIAL ENDOBRONCHIAL ULTRASOUND;  Surgeon: Garner Nash, DO;  Location: [facility];  Service: Pulmonary;;    Social History   Socioeconomic History   Marital status: Single    Spouse name: Not on file   Number of children: 0   Years of education: 2 years of college   Highest education level: Not on file  Occupational History   Not on file  Tobacco Use   Smoking status: Never  Smokeless tobacco: Never  Vaping Use   Vaping Use: Never used  Substance and Sexual Activity   Alcohol use: Never   Drug use: Never   Sexual activity: Not on file  Other Topics Concern   Not on file  Social History Narrative   Lives at home alone   Caffeine: 16 oz daily   Social Determinants of Health   Financial Resource Strain: Not on file  Food Insecurity: Not on file  Transportation Needs: Not on file  Physical Activity: Not on file  Stress: Not on file  Social Connections: Not on file  Intimate Partner Violence: Not on file    Family History  Problem Relation Age of Onset   Heart disease Mother    Delorse Limber White syndrome Father    Yves Dill Parkinson White syndrome Paternal Grandmother    Diabetes Paternal Grandmother    Breast cancer Neg Hx      Current  Outpatient Medications:    acetaminophen (TYLENOL) 500 MG tablet, Take 500 mg by mouth every 6 (six) hours as needed for moderate pain or headache., Disp: , Rfl:    albuterol (VENTOLIN HFA) 108 (90 Base) MCG/ACT inhaler, Inhale 2 puffs into the lungs every 4 (four) hours as needed., Disp: , Rfl:    ALPRAZolam (XANAX) 0.25 MG tablet, Take 0.25 mg by mouth every 4 (four) hours as needed for anxiety. for anxiety, Disp: , Rfl:    aspirin EC 81 MG tablet, Take 81 mg by mouth daily. Swallow whole., Disp: , Rfl:    citalopram (CELEXA) 10 MG tablet, Take 10 mg by mouth at bedtime., Disp: , Rfl:    clindamycin (CLEOCIN T) 1 % external solution, Apply topically 2 (two) times daily., Disp: 30 mL, Rfl: 0   clindamycin-benzoyl peroxide (BENZACLIN) gel, Apply to face 2 times a day, Disp: 25 g, Rfl: 1   diclofenac sodium (VOLTAREN) 1 % GEL, Apply topically as needed. To affected area, Disp: , Rfl:    diphenhydrAMINE (BENADRYL) 25 MG tablet, Take 25 mg by mouth See admin instructions. 25 mg at bedtime 25 mg as needed for itching, Disp: , Rfl:    Docusate Sodium (STOOL SOFTENER LAXATIVE PO), Take 1 tablet by mouth as needed., Disp: , Rfl:    eletriptan (RELPAX) 20 MG tablet, Take 20 mg by mouth as needed for headache (at onset of migraine.). May repeat in 2 hours if needed., Disp: , Rfl:    fluconazole (DIFLUCAN) 200 MG tablet, Take 1 tablet (200 mg total) by mouth daily., Disp: 14 tablet, Rfl: 0   fluticasone (FLONASE) 50 MCG/ACT nasal spray, Place 2 sprays into both nostrils daily as needed for allergies., Disp: , Rfl:    HYDROcodone-acetaminophen (HYCET) 7.5-325 mg/15 ml solution, Take 10 mLs by mouth every 4 (four) hours as needed for moderate pain., Disp: 473 mL, Rfl: 0   hydrOXYzine (ATARAX/VISTARIL) 25 MG tablet, Take 25 mg by mouth See admin instructions. 25 mg at bedtime 25 mg during the day as needed for itching/anxiety, Disp: , Rfl:    loratadine (CLARITIN) 10 MG tablet, Take 10 mg by mouth daily., Disp: ,  Rfl:    magic mouthwash (nystatin, lidocaine, diphenhydrAMINE, alum & mag hydroxide) suspension, Swish and swallow 5 mls by mouth every 6 hours as needed, Disp: 250 mL, Rfl: 1   magic mouthwash w/lidocaine SOLN, Take 5 mLs by mouth every 6 hours as needed for mouth pain. Swish and Swallow (Patient not taking: Reported on 08/19/2021), Disp: 250 mL, Rfl: 1  montelukast (SINGULAIR) 10 MG tablet, Take 10 mg by mouth at bedtime., Disp: , Rfl:    nystatin (MYCOSTATIN/NYSTOP) powder, Apply 1 application topically 3 (three) times daily., Disp: 60 g, Rfl: 1   ondansetron (ZOFRAN-ODT) 4 MG disintegrating tablet, Take 1 tablet (4 mg total) by mouth every 8 (eight) hours as needed for nausea or vomiting., Disp: 20 tablet, Rfl: 0   osimertinib mesylate (TAGRISSO) 80 MG tablet, Take 1 tablet (80 mg total) by mouth daily., Disp: 30 tablet, Rfl: 2   predniSONE (DELTASONE) 5 MG tablet, Take 1 tablet (5 mg total) by mouth daily with breakfast. Take 2 tabs daily for 7 days then 1 tab daily, Disp: 40 tablet, Rfl: 0   prochlorperazine (COMPAZINE) 10 MG tablet, TAKE 1 TABLET BY MOUTH EVERY 6 HOURS AS NEEDED FOR NAUSEA FOR VOMITING, Disp: 30 tablet, Rfl: 0   solifenacin (VESICARE) 10 MG tablet, Take 10 mg by mouth every evening., Disp: , Rfl:    sucralfate (CARAFATE) 1 g tablet, Take 1 tablet (1 g total) by mouth 4 (four) times daily -  with meals and at bedtime. 5 min before meals for radiation induced esophagitis, Disp: 120 tablet, Rfl: 2   zolpidem (AMBIEN) 10 MG tablet, Take 1 tablet (10 mg total) by mouth at bedtime as needed for sleep., Disp: 15 tablet, Rfl: 0  PHYSICAL EXAM: ECOG FS:2 - Symptomatic, <50% confined to bed    Vitals:   11/11/21 1354  BP: (!) 151/91  Pulse: 91  Resp: 18  Temp: 97.9 F (36.6 C)  TempSrc: Oral  SpO2: 98%  Weight: 244 lb 4.8 oz (110.8 kg)   Physical Exam Vitals and nursing note reviewed.  Constitutional:      Appearance: She is well-developed. She is not ill-appearing or  toxic-appearing.  HENT:     Head: Normocephalic and atraumatic.     Nose: Nose normal.     Mouth/Throat:     Mouth: Mucous membranes are dry.     Comments: No sores seen on oral mucosa Eyes:     General: No scleral icterus.       Right eye: No discharge.        Left eye: No discharge.     Conjunctiva/sclera: Conjunctivae normal.  Neck:     Vascular: No JVD.  Cardiovascular:     Rate and Rhythm: Normal rate and regular rhythm.     Pulses: Normal pulses.     Heart sounds: Normal heart sounds.  Pulmonary:     Effort: Pulmonary effort is normal.     Breath sounds: Normal breath sounds.  Chest:    Abdominal:     General: There is no distension.  Musculoskeletal:        General: Normal range of motion.     Cervical back: Normal range of motion.  Skin:    General: Skin is warm and dry.  Neurological:     Mental Status: She is oriented to person, place, and time.     GCS: GCS eye subscore is 4. GCS verbal subscore is 5. GCS motor subscore is 6.     Comments: Fluent speech, no facial droop.  Psychiatric:        Behavior: Behavior normal.       LABORATORY DATA: I have reviewed the data as listed    Latest Ref Rng & Units 11/11/2021    1:59 PM 11/04/2021    1:51 PM 10/28/2021   12:14 PM  CBC  WBC 4.0 - 10.5 K/uL  3.3   4.6   3.5    Hemoglobin 12.0 - 15.0 g/dL 11.9   13.0   13.0    Hematocrit 36.0 - 46.0 % 35.6   38.6   38.4    Platelets 150 - 400 K/uL 148   163   170          Latest Ref Rng & Units 11/11/2021   12:29 PM 11/04/2021    1:51 PM 10/28/2021   12:14 PM  CMP  Glucose 70 - 99 mg/dL 133   136   140    BUN 6 - 20 mg/dL 20   16   20     Creatinine 0.44 - 1.00 mg/dL 1.72   1.40   1.54    Sodium 135 - 145 mmol/L 138   137   137    Potassium 3.5 - 5.1 mmol/L 4.0   4.0   4.4    Chloride 98 - 111 mmol/L 104   104   103    CO2 22 - 32 mmol/L 24   24   25     Calcium 8.9 - 10.3 mg/dL 10.1   9.7   9.9    Total Protein 6.5 - 8.1 g/dL 8.2   7.7   8.1    Total Bilirubin  0.3 - 1.2 mg/dL 0.5   0.5   0.5    Alkaline Phos 38 - 126 U/L 79   84   81    AST 15 - 41 U/L 24   22   25     ALT 0 - 44 U/L 25   21   24          RADIOGRAPHIC STUDIES: I have personally reviewed the radiological images as listed and agreed with the findings in the report. No images are attached to the encounter. No results found.   ASSESSMENT & PLAN: Patient is a 56 y.o. female with oncologic history of stage IV Non-small cell lung cancer, adenocarcinoma with brain metastasis followed by oncologist Dr. Burr Medico.  #)Rash-  Patient afebrile, nontoxic appearing. Rash is consistent with irritation from radiation. No signs of infection or abscess. She has ointment at home that she has been applying for pain relief.  #) Dehydration, poor PO intake- Clinically appears dehydrated with dry mucus membranes.Patient given liter of IVF here in clinic. CMP shows BUN/creatinine 20/1.72, slightly worse from x 1 week ago when creatinine was 1.4. Engaged in shared decision making with patient and she is agreeable with plan to continue to push fluids at home and will return to clinic in x 2 days for repeat labs and IVF.   #)Esophagitis- recently completed radiation so pain will likely start to improve. She had pain relief with Hycet and is requesting a refill. I have reviewed the PDMP during this encounter. Refill sent to her pharmacy. No signs of mucositis on exam.   #)stage IV Non-small cell lung cancer, adenocarcinoma with brain metastasis- continue treatment per oncologist.   Oncologist Dr. Burr Medico is agreeable with plan.  Visit Diagnosis: 1. Dehydration   2. Primary adenocarcinoma of lung, unspecified laterality ([place])   3. Esophagitis      No orders of the defined types were placed in this encounter.   All questions were answered. The patient knows to call the clinic with any problems, questions or concerns. No barriers to learning was detected.  I have spent a total of 30 minutes minutes of  face-to-face and non-face-to-face time, preparing to see the patient, obtaining  and/or reviewing separately obtained history, performing a medically appropriate examination, counseling and educating the patient, ordering tests,  documenting clinical information in the electronic health record, and care coordination.     Thank you for allowing me to participate in the care of this patient.    Barrie Folk, PA-C Department of Hematology/Oncology [facility] at [facility] Phone: [phone]  [phone]    11/11/2021 4:43 PM

## 2021-11-12 ENCOUNTER — Encounter: Payer: Self-pay | Admitting: Licensed Clinical Social Worker

## 2021-11-12 NOTE — Progress Notes (Signed)
[place] Work  Initial Assessment   Abigail Golden is a 56 y.o. year old female presenting alone. Clinical Social Work was referred by medical provider for assessment of psychosocial needs.   SDOH (Social Determinants of Health) assessments performed: Yes SDOH Interventions    Flowsheet Row Most Recent Value  SDOH Interventions   Financial Strain Interventions Other (Comment)  [pt signed application for [facility] referral, will send at pt's direction]       SDOH Screenings   Alcohol Screen: Not on file  Depression (PHQ2-9): Not on file  Financial Resource Strain: Medium Risk   Difficulty of Paying Living Expenses: Somewhat hard  Food Insecurity: Not on file  Housing: Not on file  Physical Activity: Not on file  Social Connections: Not on file  Stress: Not on file  Tobacco Use: Low Risk    Smoking Tobacco Use: Never   Smokeless Tobacco Use: Never   Passive Exposure: Not on file  Transportation Needs: Not on file     Distress Screen completed: No    09/11/2021    3:56 PM  ONCBCN DISTRESS SCREENING  Distress experienced in past week (1-10) 4  Emotional problem type Nervousness/Anxiety;Adjusting to illness      Family/Social Information:  Housing Arrangement: Pt resides w/ her friend [name] with whom she has been staying while undergoing treatment thinking it would be short term.  Karlye plans to start college in the fall and pt will no longer be able to stay with her.  Pt has a home which she intends to return to when [place] needs to leave.  Pt is hopeful that by that time she will have recovered to some degree physically and will be able to manage on her own.  Pt is due for scans on June 8th to determine if there is progression of disease, if so pt will re-evaluate her future living arrangements. Family members/support persons in your life? Friends and [employer] concerns: not at present, but pt is not allowed to drive currently and will have  transportation concerns if she is not released to drive prior to [employer] leaving for college.  Employment: Out on work excuse Pt is now receiving long term disability benefits from her place of employment.  Pt has left a message w/ HR to see how long these benefits will last.  Income source: long term work disability benefits Financial concerns:  at present pt reports she is okay financially; however, will need to file for SSD if she is unable to return to work Type of concern: None Food access concerns: no Religious or spiritual practice: Yes-  Services Currently in place:  No services currently in place  Coping/ Adjustment to diagnosis: Patient understands treatment plan and what happens next? yes Concerns about diagnosis and/or treatment: How will I care for myself Patient reported stressors: Facing my mortality Hopes and/or priorities: Pt's priority at present is to continue treatment w/ the hope of positive results Patient enjoys time with family/ friends Current coping skills/ strengths: Supportive family/friends     SUMMARY: Current SDOH Barriers:  Pt reports no barriers at present, but may need to re-evaluate following scans on June 6th  Clinical Social Work Clinical Goal(s):  Pt to continue to assess for goals based on pt's response to treatment  Interventions: Discussed common feeling and emotions when being diagnosed with cancer, and the importance of support during treatment Informed patient of the support team roles and support services at [facility] Provided CSW contact information  and encouraged patient to call with any questions or concerns [facility] application signed in preparation for possible need to apply for disability   Follow Up Plan: Patient will contact CSW with any support or resource needs Patient verbalizes understanding of plan: Yes    Henriette Combs, LCSW

## 2021-11-13 ENCOUNTER — Other Ambulatory Visit: Payer: Self-pay | Admitting: Hematology

## 2021-11-13 ENCOUNTER — Inpatient Hospital Stay (HOSPITAL_BASED_OUTPATIENT_CLINIC_OR_DEPARTMENT_OTHER): Payer: BC Managed Care – PPO | Admitting: Physician Assistant

## 2021-11-13 ENCOUNTER — Inpatient Hospital Stay: Payer: BC Managed Care – PPO

## 2021-11-13 ENCOUNTER — Other Ambulatory Visit (HOSPITAL_COMMUNITY): Payer: Self-pay

## 2021-11-13 ENCOUNTER — Other Ambulatory Visit: Payer: Self-pay

## 2021-11-13 VITALS — BP 137/76 | HR 84 | Temp 97.7°F | Resp 16 | Wt 244.7 lb

## 2021-11-13 VITALS — BP 120/54 | HR 70 | Resp 16

## 2021-11-13 DIAGNOSIS — R638 Other symptoms and signs concerning food and fluid intake: Secondary | ICD-10-CM

## 2021-11-13 DIAGNOSIS — C3412 Malignant neoplasm of upper lobe, left bronchus or lung: Secondary | ICD-10-CM | POA: Diagnosis not present

## 2021-11-13 LAB — CMP (CANCER CENTER ONLY)
ALT: 28 U/L (ref 0–44)
AST: 29 U/L (ref 15–41)
Albumin: 4.5 g/dL (ref 3.5–5.0)
Alkaline Phosphatase: 70 U/L (ref 38–126)
Anion gap: 11 (ref 5–15)
BUN: 18 mg/dL (ref 6–20)
CO2: 23 mmol/L (ref 22–32)
Calcium: 9.7 mg/dL (ref 8.9–10.3)
Chloride: 108 mmol/L (ref 98–111)
Creatinine: 1.57 mg/dL — ABNORMAL HIGH (ref 0.44–1.00)
GFR, Estimated: 39 mL/min — ABNORMAL LOW (ref >60)
Glucose, Bld: 114 mg/dL — ABNORMAL HIGH (ref 70–99)
Potassium: 4.3 mmol/L (ref 3.5–5.1)
Sodium: 142 mmol/L (ref 135–145)
Total Bilirubin: 0.6 mg/dL (ref 0.3–1.2)
Total Protein: 7.9 g/dL (ref 6.5–8.1)

## 2021-11-13 LAB — CBC WITH DIFFERENTIAL (CANCER CENTER ONLY)
Abs Immature Granulocytes: 0 10*3/uL (ref 0.00–0.07)
Basophils Absolute: 0 10*3/uL (ref 0.0–0.1)
Basophils Relative: 0 %
Eosinophils Absolute: 0 10*3/uL (ref 0.0–0.5)
Eosinophils Relative: 0 %
HCT: 37.2 % (ref 36.0–46.0)
Hemoglobin: 12.4 g/dL (ref 12.0–15.0)
Immature Granulocytes: 0 %
Lymphocytes Relative: 17 %
Lymphs Abs: 0.5 10*3/uL — ABNORMAL LOW (ref 0.7–4.0)
MCH: 27.1 pg (ref 26.0–34.0)
MCHC: 33.3 g/dL (ref 30.0–36.0)
MCV: 81.4 fL (ref 80.0–100.0)
Monocytes Absolute: 0.4 10*3/uL (ref 0.1–1.0)
Monocytes Relative: 14 %
Neutro Abs: 1.8 10*3/uL (ref 1.7–7.7)
Neutrophils Relative %: 69 %
Platelet Count: 153 10*3/uL (ref 150–400)
RBC: 4.57 MIL/uL (ref 3.87–5.11)
RDW: 15.3 % (ref 11.5–15.5)
WBC Count: 2.6 10*3/uL — ABNORMAL LOW (ref 4.0–10.5)
nRBC: 0 % (ref 0.0–0.2)

## 2021-11-13 MED ORDER — OSIMERTINIB MESYLATE 80 MG PO TABS
80.0000 mg | ORAL_TABLET | Freq: Every day | ORAL | 2 refills | Status: DC
  Filled 2021-11-13: qty 30, 30d supply, fill #0
  Filled 2021-12-10: qty 30, 30d supply, fill #1
  Filled 2022-01-13: qty 30, 30d supply, fill #2

## 2021-11-13 MED ORDER — SODIUM CHLORIDE 0.9 % IV SOLN: Freq: Once | INTRAVENOUS | Status: AC

## 2021-11-13 NOTE — Patient Instructions (Signed)
Rehydration, Adult Rehydration is the replacement of body fluids, salts, and minerals (electrolytes) that are lost during dehydration. Dehydration is when there is not enough water or other fluids in the body. This happens when you lose more fluids than you take in. Common causes of dehydration include: Not drinking enough fluids. This can occur when you are ill or doing activities that require a lot of energy, especially in hot weather. Conditions that cause loss of water or other fluids, such as diarrhea, vomiting, sweating, or urinating a lot. Other illnesses, such as fever or infection. Certain medicines, such as those that remove excess fluid from the body (diuretics). Symptoms of mild or moderate dehydration may include thirst, dry lips and mouth, and dizziness. Symptoms of severe dehydration may include increased heart rate, confusion, fainting, and not urinating. For severe dehydration, you may need to get fluids through an IV at the hospital. For mild or moderate dehydration, you can usually rehydrate at home by drinking certain fluids as told by your health care provider. What are the risks? Generally, rehydration is safe. However, taking in too much fluid (overhydration) can be a problem. This is rare. Overhydration can cause an electrolyte imbalance, kidney failure, or a decrease in salt (sodium) levels in the body. Supplies needed You will need an oral rehydration solution (ORS) if your health care provider tells you to use one. This is a drink to treat dehydration. It can be found in pharmacies and retail stores. How to rehydrate Fluids Follow instructions from your health care provider for rehydration. The kind of fluid and the amount you should drink depend on your condition. In general, you should choose drinks that you prefer. If told by your health care provider, drink an ORS. Make an ORS by following instructions on the package. Start by drinking small amounts, about  cup (120  mL) every 5-10 minutes. Slowly increase how much you drink until you have taken the amount recommended by your health care provider. Drink enough clear fluids to keep your urine pale yellow. If you were told to drink an ORS, finish it first, then start slowly drinking other clear fluids. Drink fluids such as: Water. This includes sparkling water and flavored water. Drinking only water can lead to having too little sodium in your body (hyponatremia). Follow the advice of your health care provider. Water from ice chips you suck on. Fruit juice with water you add to it (diluted). Sports drinks. Hot or cold herbal teas. Broth-based soups. Milk or milk products. Food Follow instructions from your health care provider about what to eat while you rehydrate. Your health care provider may recommend that you slowly begin eating regular foods in small amounts. Eat foods that contain a healthy balance of electrolytes, such as bananas, oranges, potatoes, tomatoes, and spinach. Avoid foods that are greasy or contain a lot of sugar. In some cases, you may get nutrition through a feeding tube that is passed through your nose and into your stomach (nasogastric tube, or NG tube). This may be done if you have uncontrolled vomiting or diarrhea. Beverages to avoid  Certain beverages may make dehydration worse. While you rehydrate, avoid drinking alcohol. How to tell if you are recovering from dehydration You may be recovering from dehydration if: You are urinating more often than before you started rehydrating. Your urine is pale yellow. Your energy level improves. You vomit less frequently. You have diarrhea less frequently. Your appetite improves or returns to normal. You feel less dizzy or less light-headed.   Your skin tone and color start to look more normal. Follow these instructions at home: Take over-the-counter and prescription medicines only as told by your health care provider. Do not take sodium  tablets. Doing this can lead to having too much sodium in your body (hypernatremia). Contact a health care provider if: You continue to have symptoms of mild or moderate dehydration, such as: Thirst. Dry lips. Slightly dry mouth. Dizziness. Dark urine or less urine than normal. Muscle cramps. You continue to vomit or have diarrhea. Get help right away if you: Have symptoms of dehydration that get worse. Have a fever. Have a severe headache. Have been vomiting and the following happens: Your vomiting gets worse or does not go away. Your vomit includes blood or green matter (bile). You cannot eat or drink without vomiting. Have problems with urination or bowel movements, such as: Diarrhea that gets worse or does not go away. Blood in your stool (feces). This may cause stool to look black and tarry. Not urinating, or urinating only a small amount of very dark urine, within 6-8 hours. Have trouble breathing. Have symptoms that get worse with treatment. These symptoms may represent a serious problem that is an emergency. Do not wait to see if the symptoms will go away. Get medical help right away. Call your local emergency services (911 in the [place] Do not drive yourself to the hospital. Summary Rehydration is the replacement of body fluids and minerals (electrolytes) that are lost during dehydration. Follow instructions from your health care provider for rehydration. The kind of fluid and amount you should drink depend on your condition. Slowly increase how much you drink until you have taken the amount recommended by your health care provider. Contact your health care provider if you continue to show signs of mild or moderate dehydration. This information is not intended to replace advice given to you by your health care provider. Make sure you discuss any questions you have with your health care provider. Document Revised: 08/10/2019 Document Reviewed: 06/20/2019 Elsevier Patient  Education  2023 [employer]  

## 2021-11-13 NOTE — Progress Notes (Deleted)
Symptom Management Consult note [place]    Patient Care Team: Aletha Halim., PA-C as PCP - General (Family Medicine) Valrie Hart, RN as Oncology Nurse Navigator (Oncology)    Name of the patient: Abigail Golden  [id number]  April 03, 1966   Date of visit: 11/13/2021    Chief complaint/ Reason for visit- f/u labs for dehydration  Oncology History Overview Note   Cancer Staging  Primary adenocarcinoma of upper lobe of left lung [facility]) Staging form: Lung, AJCC 8th Edition - Clinical stage from 04/24/2021: Stage IVA (cT2, cN2, pM1b) - Signed by Truitt Merle, MD on 05/14/2021    Primary adenocarcinoma of upper lobe of left lung ([place])  04/22/2021 Imaging   CT HEAD WO CONTRAST   IMPRESSION: Two separate areas of vasogenic edema within the left hemisphere, 1 within the inferior frontal region in the other at the left parietal vertex. Metastatic disease would be the most likely cause of this appearance. Other possibilities include venous infarctions and cerebritis. MRI with contrast recommended.    04/23/2021 Imaging   MR Brain W and Wo Contrast  IMPRESSION: 1. Metastatic disease to the brain. Two enhancing brain masses with moderate associated vasogenic edema: solid 16 mm left parietal lobe mass, and a larger 29 mm rim enhancing cystic or necrotic mass in the left inferior frontal gyrus. Two other small 2-3 mm metastases in the right inferior frontal gyrus, right posterior temporal lobe. And questionable two additional punctate metastases in both superior frontal gyri abutting the falx (series 18, image 46). 2. No significant intracranial mass effect. No other intracranial abnormality.   04/23/2021 Imaging   CT Chest W Contrast  IMPRESSION: 4.5 cm spiculated left upper lobe mass most compatible with primary lung cancer. This abuts the medial pleural surface. A few small adjacent pre-vascular mediastinal lymph nodes, none pathologically enlarged. Recommend  cardiothoracic surgical and oncologic consultation.   No acute findings or evidence of metastatic disease in the abdomen or pelvis.   04/24/2021 Cancer Staging   Staging form: Lung, AJCC 8th Edition - Clinical stage from 04/24/2021: Stage IVA (cT2, cN2, pM1b) - Signed by Truitt Merle, MD on 05/14/2021    04/24/2021 Initial Biopsy   FINAL MICROSCOPIC DIAGNOSIS:   A. LUNG, LUL, FINE NEEDLE ASPIRATION:  - Malignant cells present, consistent with adenocarcinoma   B. LUNG, LUL, BRUSHING:  - Malignant cells consistent with adenocarcinoma, see comment    COMMENT:  B.  Immunohistochemical stains show that the tumor cells are positive for TTF-1 while they are negative for p63 and CK5/6, consistent with above interpretation. The number of tumor cells appears to be insufficient for molecular studies.   05/03/2021 Initial Diagnosis   Lung cancer ([place])   05/08/2021 - 05/13/2021 Radiation Therapy   SRS treatment to the metastatic brain lesions under the care of Dr. Tammi Klippel    08/05/2021 Imaging   EXAM: CT CHEST, ABDOMEN, AND PELVIS WITH CONTRAST  IMPRESSION: 1. No substantial change in size of the left upper lobe pulmonary lesion. 2. Borderline to mildly enlarged adjacent prevascular/AP window lymph nodes have increased in size in the interval. This finding raises concern for disease progression. 3. Interval development of upper normal bilateral common iliac and left external iliac lymph nodes. Close attention on follow-up recommended. 4. No other evidence for metastatic disease in the abdomen or pelvis.     Current Therapy: Tagrisso PO daily  Interval history- Abigail Golden is a 56 yo female with oncologic history as above  presntingto [place] today     ROS  All other systems are reviewed and are negative for acute change except as noted in the HPI.   Allergies  Allergen Reactions   Celebrex [Celecoxib] Rash    On face itching   Minocycline Rash    On face Itching      Past Medical History:  Diagnosis Date   Allergy    Anxiety    Chronic bronchitis ([place])    Depression    Headache    Hypoglycemia    Lower back pain    since fall ~ 2012 (05/25/2015)   Migraine    05/25/2015 "probably once/month"   Migraine headache    Pain in left hip    since fall ~ 2012 (05/25/2015)   Pneumonia "several times"     Past Surgical History:  Procedure Laterality Date   ABDOMINAL HYSTERECTOMY  11/2002   "found benign tumors"   BRONCHIAL BIOPSY  04/24/2021   Procedure: BRONCHIAL BIOPSIES;  Surgeon: Garner Nash, DO;  Location: [facility];  Service: Pulmonary;;   BRONCHIAL BRUSHINGS  04/24/2021   Procedure: BRONCHIAL BRUSHINGS;  Surgeon: Garner Nash, DO;  Location: [facility];  Service: Pulmonary;;   BRONCHIAL NEEDLE ASPIRATION BIOPSY  04/24/2021   Procedure: BRONCHIAL NEEDLE ASPIRATION BIOPSIES;  Surgeon: Garner Nash, DO;  Location: [facility];  Service: Pulmonary;;   CHEST TUBE INSERTION  04/24/2021   Procedure: CHEST TUBE INSERTION;  Surgeon: Garner Nash, DO;  Location: [facility];  Service: Pulmonary;;   CHOLECYSTECTOMY N/A 05/25/2015   Procedure: LAPAROSCOPIC CHOLECYSTECTOMY;  Surgeon: Ralene Ok, MD;  Location: [facility];  Service: General;  Laterality: N/A;   LAPAROSCOPIC CHOLECYSTECTOMY  05/25/2015   MENISCUS REPAIR Right 03/2017   VIDEO BRONCHOSCOPY WITH ENDOBRONCHIAL NAVIGATION N/A 04/24/2021   Procedure: VIDEO BRONCHOSCOPY WITH ENDOBRONCHIAL NAVIGATION;  Surgeon: Garner Nash, DO;  Location: [facility];  Service: Pulmonary;  Laterality: N/A;   VIDEO BRONCHOSCOPY WITH RADIAL ENDOBRONCHIAL ULTRASOUND  04/24/2021   Procedure: VIDEO BRONCHOSCOPY WITH RADIAL ENDOBRONCHIAL ULTRASOUND;  Surgeon: Garner Nash, DO;  Location: [facility];  Service: Pulmonary;;    Social History   Socioeconomic History   Marital status: Single    Spouse name: Not on file   Number of children: 0   Years of education: 2 years of college    Highest education level: Not on file  Occupational History   Not on file  Tobacco Use   Smoking status: Never   Smokeless tobacco: Never  Vaping Use   Vaping Use: Never used  Substance and Sexual Activity   Alcohol use: Never   Drug use: Never   Sexual activity: Not on file  Other Topics Concern   Not on file  Social History Narrative   Lives at home alone   Caffeine: 16 oz daily   Social Determinants of Health   Financial Resource Strain: Medium Risk   Difficulty of Paying Living Expenses: Somewhat hard  Food Insecurity: Not on file  Transportation Needs: Not on file  Physical Activity: Not on file  Stress: Not on file  Social Connections: Not on file  Intimate Partner Violence: Not on file    Family History  Problem Relation Age of Onset   Heart disease Mother    Delorse Limber White syndrome Father    Yves Dill Parkinson White syndrome Paternal Grandmother    Diabetes Paternal Grandmother    Breast cancer Neg Hx      Current Outpatient Medications:  acetaminophen (TYLENOL) 500 MG tablet, Take 500 mg by mouth every 6 (six) hours as needed for moderate pain or headache., Disp: , Rfl:    albuterol (VENTOLIN HFA) 108 (90 Base) MCG/ACT inhaler, Inhale 2 puffs into the lungs every 4 (four) hours as needed., Disp: , Rfl:    ALPRAZolam (XANAX) 0.25 MG tablet, Take 0.25 mg by mouth every 4 (four) hours as needed for anxiety. for anxiety, Disp: , Rfl:    aspirin EC 81 MG tablet, Take 81 mg by mouth daily. Swallow whole., Disp: , Rfl:    citalopram (CELEXA) 10 MG tablet, Take 10 mg by mouth at bedtime., Disp: , Rfl:    clindamycin (CLEOCIN T) 1 % external solution, Apply topically 2 (two) times daily., Disp: 30 mL, Rfl: 0   clindamycin-benzoyl peroxide (BENZACLIN) gel, Apply to face 2 times a day, Disp: 25 g, Rfl: 1   diclofenac sodium (VOLTAREN) 1 % GEL, Apply topically as needed. To affected area, Disp: , Rfl:    diphenhydrAMINE (BENADRYL) 25 MG tablet, Take 25 mg by mouth  See admin instructions. 25 mg at bedtime 25 mg as needed for itching, Disp: , Rfl:    Docusate Sodium (STOOL SOFTENER LAXATIVE PO), Take 1 tablet by mouth as needed., Disp: , Rfl:    eletriptan (RELPAX) 20 MG tablet, Take 20 mg by mouth as needed for headache (at onset of migraine.). May repeat in 2 hours if needed., Disp: , Rfl:    fluconazole (DIFLUCAN) 200 MG tablet, Take 1 tablet (200 mg total) by mouth daily., Disp: 14 tablet, Rfl: 0   fluticasone (FLONASE) 50 MCG/ACT nasal spray, Place 2 sprays into both nostrils daily as needed for allergies., Disp: , Rfl:    HYDROcodone-acetaminophen (HYCET) 7.5-325 mg/15 ml solution, Take 10 mLs by mouth every 4 (four) hours as needed for moderate pain., Disp: 473 mL, Rfl: 0   hydrOXYzine (ATARAX/VISTARIL) 25 MG tablet, Take 25 mg by mouth See admin instructions. 25 mg at bedtime 25 mg during the day as needed for itching/anxiety, Disp: , Rfl:    loratadine (CLARITIN) 10 MG tablet, Take 10 mg by mouth daily., Disp: , Rfl:    magic mouthwash (nystatin, lidocaine, diphenhydrAMINE, alum & mag hydroxide) suspension, Swish and swallow 5 mls by mouth every 6 hours as needed, Disp: 250 mL, Rfl: 1   magic mouthwash w/lidocaine SOLN, Take 5 mLs by mouth every 6 hours as needed for mouth pain. Swish and Swallow (Patient not taking: Reported on 08/19/2021), Disp: 250 mL, Rfl: 1   montelukast (SINGULAIR) 10 MG tablet, Take 10 mg by mouth at bedtime., Disp: , Rfl:    nystatin (MYCOSTATIN/NYSTOP) powder, Apply 1 application topically 3 (three) times daily., Disp: 60 g, Rfl: 1   ondansetron (ZOFRAN-ODT) 4 MG disintegrating tablet, Take 1 tablet (4 mg total) by mouth every 8 (eight) hours as needed for nausea or vomiting., Disp: 20 tablet, Rfl: 0   osimertinib mesylate (TAGRISSO) 80 MG tablet, Take 1 tablet (80 mg total) by mouth daily., Disp: 30 tablet, Rfl: 2   predniSONE (DELTASONE) 5 MG tablet, Take 1 tablet (5 mg total) by mouth daily with breakfast. Take 2 tabs daily for  7 days then 1 tab daily, Disp: 40 tablet, Rfl: 0   prochlorperazine (COMPAZINE) 10 MG tablet, TAKE 1 TABLET BY MOUTH EVERY 6 HOURS AS NEEDED FOR NAUSEA FOR VOMITING, Disp: 30 tablet, Rfl: 0   solifenacin (VESICARE) 10 MG tablet, Take 10 mg by mouth every evening., Disp: , Rfl:  sucralfate (CARAFATE) 1 g tablet, Take 1 tablet (1 g total) by mouth 4 (four) times daily -  with meals and at bedtime. 5 min before meals for radiation induced esophagitis, Disp: 120 tablet, Rfl: 2   zolpidem (AMBIEN) 10 MG tablet, Take 1 tablet (10 mg total) by mouth at bedtime as needed for sleep., Disp: 15 tablet, Rfl: 0  PHYSICAL EXAM: ECOG FS:{CHL ONC EH:[id number]}   There were no vitals filed for this visit. Physical Exam     LABORATORY DATA: I have reviewed the data as listed    Latest Ref Rng & Units 11/11/2021    1:59 PM 11/04/2021    1:51 PM 10/28/2021   12:14 PM  CBC  WBC 4.0 - 10.5 K/uL 3.3   4.6   3.5    Hemoglobin 12.0 - 15.0 g/dL 11.9   13.0   13.0    Hematocrit 36.0 - 46.0 % 35.6   38.6   38.4    Platelets 150 - 400 K/uL 148   163   170          Latest Ref Rng & Units 11/11/2021   12:29 PM 11/04/2021    1:51 PM 10/28/2021   12:14 PM  CMP  Glucose 70 - 99 mg/dL 133   136   140    BUN 6 - 20 mg/dL 20   16   20     Creatinine 0.44 - 1.00 mg/dL 1.72   1.40   1.54    Sodium 135 - 145 mmol/L 138   137   137    Potassium 3.5 - 5.1 mmol/L 4.0   4.0   4.4    Chloride 98 - 111 mmol/L 104   104   103    CO2 22 - 32 mmol/L 24   24   25     Calcium 8.9 - 10.3 mg/dL 10.1   9.7   9.9    Total Protein 6.5 - 8.1 g/dL 8.2   7.7   8.1    Total Bilirubin 0.3 - 1.2 mg/dL 0.5   0.5   0.5    Alkaline Phos 38 - 126 U/L 79   84   81    AST 15 - 41 U/L 24   22   25     ALT 0 - 44 U/L 25   21   24          RADIOGRAPHIC STUDIES: I have personally reviewed the radiological images as listed and agreed with the findings in the report. No images are attached to the encounter. No results found.   ASSESSMENT &  PLAN: Patient is a 56 y.o. female ***   Visit Diagnosis: 1. Primary adenocarcinoma of upper lobe of left lung ([place])      No orders of the defined types were placed in this encounter.   All questions were answered. The patient knows to call the clinic with any problems, questions or concerns. No barriers to learning was detected.  I have spent a total of *** minutes minutes of face-to-face and non-face-to-face time, preparing to see the patient, obtaining and/or reviewing separately obtained history, performing a medically appropriate examination, counseling and educating the patient, ordering tests,  documenting clinical information in the electronic health record, and care coordination.     Thank you for allowing me to participate in the care of this patient.    Barrie Folk, PA-C Department of Hematology/Oncology [place] [facility] Phone:  [phone]  [phone]    11/13/2021 1:56 PM

## 2021-11-13 NOTE — Progress Notes (Signed)
Symptom Management Consult note [place]    Patient Care Team: Aletha Halim., PA-C as PCP - General (Family Medicine) Valrie Hart, RN as Oncology Nurse Navigator (Oncology)    Name of the patient: Abigail Golden  [id number]  08-28-1965   Date of visit: 11/13/2021    Chief complaint/ Reason for visit- labs, dehydration  Oncology History Overview Note   Cancer Staging  Primary adenocarcinoma of upper lobe of left lung [facility]) Staging form: Lung, AJCC 8th Edition - Clinical stage from 04/24/2021: Stage IVA (cT2, cN2, pM1b) - Signed by Truitt Merle, MD on 05/14/2021    Primary adenocarcinoma of upper lobe of left lung ([place])  04/22/2021 Imaging   CT HEAD WO CONTRAST   IMPRESSION: Two separate areas of vasogenic edema within the left hemisphere, 1 within the inferior frontal region in the other at the left parietal vertex. Metastatic disease would be the most likely cause of this appearance. Other possibilities include venous infarctions and cerebritis. MRI with contrast recommended.    04/23/2021 Imaging   MR Brain W and Wo Contrast  IMPRESSION: 1. Metastatic disease to the brain. Two enhancing brain masses with moderate associated vasogenic edema: solid 16 mm left parietal lobe mass, and a larger 29 mm rim enhancing cystic or necrotic mass in the left inferior frontal gyrus. Two other small 2-3 mm metastases in the right inferior frontal gyrus, right posterior temporal lobe. And questionable two additional punctate metastases in both superior frontal gyri abutting the falx (series 18, image 46). 2. No significant intracranial mass effect. No other intracranial abnormality.   04/23/2021 Imaging   CT Chest W Contrast  IMPRESSION: 4.5 cm spiculated left upper lobe mass most compatible with primary lung cancer. This abuts the medial pleural surface. A few small adjacent pre-vascular mediastinal lymph nodes, none pathologically enlarged. Recommend  cardiothoracic surgical and oncologic consultation.   No acute findings or evidence of metastatic disease in the abdomen or pelvis.   04/24/2021 Cancer Staging   Staging form: Lung, AJCC 8th Edition - Clinical stage from 04/24/2021: Stage IVA (cT2, cN2, pM1b) - Signed by Truitt Merle, MD on 05/14/2021    04/24/2021 Initial Biopsy   FINAL MICROSCOPIC DIAGNOSIS:   A. LUNG, LUL, FINE NEEDLE ASPIRATION:  - Malignant cells present, consistent with adenocarcinoma   B. LUNG, LUL, BRUSHING:  - Malignant cells consistent with adenocarcinoma, see comment    COMMENT:  B.  Immunohistochemical stains show that the tumor cells are positive for TTF-1 while they are negative for p63 and CK5/6, consistent with above interpretation. The number of tumor cells appears to be insufficient for molecular studies.   05/03/2021 Initial Diagnosis   Lung cancer ([place])   05/08/2021 - 05/13/2021 Radiation Therapy   SRS treatment to the metastatic brain lesions under the care of Dr. Tammi Klippel    08/05/2021 Imaging   EXAM: CT CHEST, ABDOMEN, AND PELVIS WITH CONTRAST  IMPRESSION: 1. No substantial change in size of the left upper lobe pulmonary lesion. 2. Borderline to mildly enlarged adjacent prevascular/AP window lymph nodes have increased in size in the interval. This finding raises concern for disease progression. 3. Interval development of upper normal bilateral common iliac and left external iliac lymph nodes. Close attention on follow-up recommended. 4. No other evidence for metastatic disease in the abdomen or pelvis.     Current Therapy: Tagrisso  Interval history- Abigail Earnhart. Golden is a 56 yo female with oncologic history as above presenting to [facility] today  with chief complaint of dehydration and labs. Patient was seen in clinic on Monday for IVF and labs. At that appointment she had elevated creatinine. Since last visit she has been trying to stay hydrated and drinking water and liquid IV at home. She  states her appetite is slowly improving and she was able to eat bites of a cheeseburger. She denies any new symptoms. She reports compliance with her medications. Denies fever, chills, chest pain, shortness of breath, headaches, abdominal pain, nausea, emesis, diarrhea.     ROS  All other systems are reviewed and are negative for acute change except as noted in the HPI.    Allergies  Allergen Reactions   Celebrex [Celecoxib] Rash    On face itching   Minocycline Rash    On face Itching     Past Medical History:  Diagnosis Date   Allergy    Anxiety    Chronic bronchitis ([place])    Depression    Headache    Hypoglycemia    Lower back pain    since fall ~ 2012 (05/25/2015)   Migraine    05/25/2015 "probably once/month"   Migraine headache    Pain in left hip    since fall ~ 2012 (05/25/2015)   Pneumonia "several times"     Past Surgical History:  Procedure Laterality Date   ABDOMINAL HYSTERECTOMY  11/2002   "found benign tumors"   BRONCHIAL BIOPSY  04/24/2021   Procedure: BRONCHIAL BIOPSIES;  Surgeon: Garner Nash, DO;  Location: [place] [facility];  Service: Pulmonary;;   BRONCHIAL BRUSHINGS  04/24/2021   Procedure: BRONCHIAL BRUSHINGS;  Surgeon: Garner Nash, DO;  Location: [place] [facility];  Service: Pulmonary;;   BRONCHIAL NEEDLE ASPIRATION BIOPSY  04/24/2021   Procedure: BRONCHIAL NEEDLE ASPIRATION BIOPSIES;  Surgeon: Garner Nash, DO;  Location: [place] [facility];  Service: Pulmonary;;   CHEST TUBE INSERTION  04/24/2021   Procedure: CHEST TUBE INSERTION;  Surgeon: Garner Nash, DO;  Location: [place] [facility];  Service: Pulmonary;;   CHOLECYSTECTOMY N/A 05/25/2015   Procedure: LAPAROSCOPIC CHOLECYSTECTOMY;  Surgeon: Ralene Ok, MD;  Location: [place];  Service: General;  Laterality: N/A;   LAPAROSCOPIC CHOLECYSTECTOMY  05/25/2015   MENISCUS REPAIR Right 03/2017   VIDEO BRONCHOSCOPY WITH ENDOBRONCHIAL NAVIGATION N/A 04/24/2021   Procedure: VIDEO BRONCHOSCOPY WITH  ENDOBRONCHIAL NAVIGATION;  Surgeon: Garner Nash, DO;  Location: [place];  Service: Pulmonary;  Laterality: N/A;   VIDEO BRONCHOSCOPY WITH RADIAL ENDOBRONCHIAL ULTRASOUND  04/24/2021   Procedure: VIDEO BRONCHOSCOPY WITH RADIAL ENDOBRONCHIAL ULTRASOUND;  Surgeon: Garner Nash, DO;  Location: [facility];  Service: Pulmonary;;    Social History   Socioeconomic History   Marital status: Single    Spouse name: Not on file   Number of children: 0   Years of education: 2 years of college   Highest education level: Not on file  Occupational History   Not on file  Tobacco Use   Smoking status: Never   Smokeless tobacco: Never  Vaping Use   Vaping Use: Never used  Substance and Sexual Activity   Alcohol use: Never   Drug use: Never   Sexual activity: Not on file  Other Topics Concern   Not on file  Social History Narrative   Lives at home alone   Caffeine: 16 oz daily   Social Determinants of Health   Financial Resource Strain: Medium Risk   Difficulty of Paying Living Expenses: Somewhat hard  Food Insecurity: Not on file  Transportation Needs: Not  on file  Physical Activity: Not on file  Stress: Not on file  Social Connections: Not on file  Intimate Partner Violence: Not on file    Family History  Problem Relation Age of Onset   Heart disease Mother    Delorse Limber White syndrome Father    Yves Dill Parkinson White syndrome Paternal Grandmother    Diabetes Paternal Grandmother    Breast cancer Neg Hx      Current Outpatient Medications:    acetaminophen (TYLENOL) 500 MG tablet, Take 500 mg by mouth every 6 (six) hours as needed for moderate pain or headache., Disp: , Rfl:    albuterol (VENTOLIN HFA) 108 (90 Base) MCG/ACT inhaler, Inhale 2 puffs into the lungs every 4 (four) hours as needed., Disp: , Rfl:    ALPRAZolam (XANAX) 0.25 MG tablet, Take 0.25 mg by mouth every 4 (four) hours as needed for anxiety. for anxiety, Disp: , Rfl:    aspirin EC 81 MG  tablet, Take 81 mg by mouth daily. Swallow whole., Disp: , Rfl:    citalopram (CELEXA) 10 MG tablet, Take 10 mg by mouth at bedtime., Disp: , Rfl:    clindamycin (CLEOCIN T) 1 % external solution, Apply topically 2 (two) times daily., Disp: 30 mL, Rfl: 0   clindamycin-benzoyl peroxide (BENZACLIN) gel, Apply to face 2 times a day, Disp: 25 g, Rfl: 1   diclofenac sodium (VOLTAREN) 1 % GEL, Apply topically as needed. To affected area, Disp: , Rfl:    diphenhydrAMINE (BENADRYL) 25 MG tablet, Take 25 mg by mouth See admin instructions. 25 mg at bedtime 25 mg as needed for itching, Disp: , Rfl:    Docusate Sodium (STOOL SOFTENER LAXATIVE PO), Take 1 tablet by mouth as needed., Disp: , Rfl:    eletriptan (RELPAX) 20 MG tablet, Take 20 mg by mouth as needed for headache (at onset of migraine.). May repeat in 2 hours if needed., Disp: , Rfl:    fluconazole (DIFLUCAN) 200 MG tablet, Take 1 tablet (200 mg total) by mouth daily., Disp: 14 tablet, Rfl: 0   fluticasone (FLONASE) 50 MCG/ACT nasal spray, Place 2 sprays into both nostrils daily as needed for allergies., Disp: , Rfl:    HYDROcodone-acetaminophen (HYCET) 7.5-325 mg/15 ml solution, Take 10 mLs by mouth every 4 (four) hours as needed for moderate pain., Disp: 473 mL, Rfl: 0   hydrOXYzine (ATARAX/VISTARIL) 25 MG tablet, Take 25 mg by mouth See admin instructions. 25 mg at bedtime 25 mg during the day as needed for itching/anxiety, Disp: , Rfl:    loratadine (CLARITIN) 10 MG tablet, Take 10 mg by mouth daily., Disp: , Rfl:    magic mouthwash (nystatin, lidocaine, diphenhydrAMINE, alum & mag hydroxide) suspension, Swish and swallow 5 mls by mouth every 6 hours as needed, Disp: 250 mL, Rfl: 1   magic mouthwash w/lidocaine SOLN, Take 5 mLs by mouth every 6 hours as needed for mouth pain. Swish and Swallow (Patient not taking: Reported on 08/19/2021), Disp: 250 mL, Rfl: 1   montelukast (SINGULAIR) 10 MG tablet, Take 10 mg by mouth at bedtime., Disp: , Rfl:     nystatin (MYCOSTATIN/NYSTOP) powder, Apply 1 application topically 3 (three) times daily., Disp: 60 g, Rfl: 1   ondansetron (ZOFRAN-ODT) 4 MG disintegrating tablet, Take 1 tablet (4 mg total) by mouth every 8 (eight) hours as needed for nausea or vomiting., Disp: 20 tablet, Rfl: 0   osimertinib mesylate (TAGRISSO) 80 MG tablet, Take 1 tablet (80 mg total) by mouth  daily., Disp: 30 tablet, Rfl: 2   predniSONE (DELTASONE) 5 MG tablet, Take 1 tablet (5 mg total) by mouth daily with breakfast. Take 2 tabs daily for 7 days then 1 tab daily, Disp: 40 tablet, Rfl: 0   prochlorperazine (COMPAZINE) 10 MG tablet, TAKE 1 TABLET BY MOUTH EVERY 6 HOURS AS NEEDED FOR NAUSEA FOR VOMITING, Disp: 30 tablet, Rfl: 0   solifenacin (VESICARE) 10 MG tablet, Take 10 mg by mouth every evening., Disp: , Rfl:    sucralfate (CARAFATE) 1 g tablet, Take 1 tablet (1 g total) by mouth 4 (four) times daily -  with meals and at bedtime. 5 min before meals for radiation induced esophagitis, Disp: 120 tablet, Rfl: 2   zolpidem (AMBIEN) 10 MG tablet, Take 1 tablet (10 mg total) by mouth at bedtime as needed for sleep., Disp: 15 tablet, Rfl: 0  PHYSICAL EXAM: ECOG FS:2 - Symptomatic, <50% confined to bed    Vitals:   11/13/21 1323  BP: 137/76  Pulse: 84  Resp: 16  Temp: 97.7 F (36.5 C)  TempSrc: Oral  SpO2: 98%  Weight: 244 lb 11.2 oz (111 kg)   Physical Exam Vitals and nursing note reviewed.  Constitutional:      Appearance: She is well-developed. She is not ill-appearing or toxic-appearing.  HENT:     Head: Normocephalic and atraumatic.     Nose: Nose normal.  Eyes:     General: No scleral icterus.       Right eye: No discharge.        Left eye: No discharge.     Conjunctiva/sclera: Conjunctivae normal.  Neck:     Vascular: No JVD.  Cardiovascular:     Rate and Rhythm: Normal rate and regular rhythm.     Pulses: Normal pulses.     Heart sounds: Normal heart sounds.  Pulmonary:     Effort: Pulmonary effort  is normal.     Breath sounds: Normal breath sounds.  Chest:     Comments: Erythema on left chest from radiation without signs of infection.  Abdominal:     General: There is no distension.  Musculoskeletal:        General: Normal range of motion.     Cervical back: Normal range of motion.  Skin:    General: Skin is warm and dry.  Neurological:     Mental Status: She is oriented to person, place, and time.     GCS: GCS eye subscore is 4. GCS verbal subscore is 5. GCS motor subscore is 6.     Comments: Fluent speech, no facial droop.  Psychiatric:        Behavior: Behavior normal.       LABORATORY DATA: I have reviewed the data as listed    Latest Ref Rng & Units 11/13/2021    1:27 PM 11/11/2021    1:59 PM 11/04/2021    1:51 PM  CBC  WBC 4.0 - 10.5 K/uL 2.6   3.3   4.6    Hemoglobin 12.0 - 15.0 g/dL 12.4   11.9   13.0    Hematocrit 36.0 - 46.0 % 37.2   35.6   38.6    Platelets 150 - 400 K/uL 153   148   163          Latest Ref Rng & Units 11/13/2021    1:27 PM 11/11/2021   12:29 PM 11/04/2021    1:51 PM  CMP  Glucose 70 - 99 mg/dL 114  133   136    BUN 6 - 20 mg/dL 18   20   16     Creatinine 0.44 - 1.00 mg/dL 1.57   1.72   1.40    Sodium 135 - 145 mmol/L 142   138   137    Potassium 3.5 - 5.1 mmol/L 4.3   4.0   4.0    Chloride 98 - 111 mmol/L 108   104   104    CO2 22 - 32 mmol/L 23   24   24     Calcium 8.9 - 10.3 mg/dL 9.7   10.1   9.7    Total Protein 6.5 - 8.1 g/dL 7.9   8.2   7.7    Total Bilirubin 0.3 - 1.2 mg/dL 0.6   0.5   0.5    Alkaline Phos 38 - 126 U/L 70   79   84    AST 15 - 41 U/L 29   24   22     ALT 0 - 44 U/L 28   25   21          RADIOGRAPHIC STUDIES: I have personally reviewed the radiological images as listed and agreed with the findings in the report. No images are attached to the encounter. No results found.   ASSESSMENT & PLAN: Patient is a 56 y.o. female with oncologic history of stage IV non-small cell lung cancer, adenocarcinoma with  brain metastasis followed by oncologist Dr. Burr Medico. I viewed recent oncology notes and lab work.  #)Poor fluid intake- Patient given liter of IVF in clinic. CMP today shows kidney function is improving 1.72 --> 1.57. She tolerated sips of water while here in clinic. Patient knows to continue to push fluids at home and call the cancer center if wanting IVF in the future for symptom support. She plans to pick up the Hycet I prescribed at last clinic visit today which will also help with her esophagitis which in the main cause of pain.   Visit Diagnosis: 1. Poor fluid intake      No orders of the defined types were placed in this encounter.   All questions were answered. The patient knows to call the clinic with any problems, questions or concerns. No barriers to learning was detected.  I have spent a total of 20 minutes minutes of face-to-face and non-face-to-face time, preparing to see the patient, obtaining and/or reviewing separately obtained history, performing a medically appropriate examination, counseling and educating the patient, ordering tests,  documenting clinical information in the electronic health record, and care coordination.     Thank you for allowing me to participate in the care of this patient.    Barrie Folk, PA-C Department of Hematology/Oncology [facility] at [facility] Phone: [phone]  [phone]    11/13/2021 3:56 PM

## 2021-11-14 ENCOUNTER — Other Ambulatory Visit (HOSPITAL_COMMUNITY): Payer: Self-pay

## 2021-11-20 ENCOUNTER — Other Ambulatory Visit: Payer: Self-pay | Admitting: Hematology

## 2021-11-27 ENCOUNTER — Other Ambulatory Visit: Payer: Self-pay | Admitting: Urology

## 2021-11-28 ENCOUNTER — Ambulatory Visit (HOSPITAL_COMMUNITY)
Admission: RE | Admit: 2021-11-28 | Discharge: 2021-11-28 | Disposition: A | Discharge status: Home / Self Care | Payer: BC Managed Care – PPO | Source: Ambulatory Visit | Attending: Radiation Oncology | Admitting: Radiation Oncology

## 2021-11-28 DIAGNOSIS — C7931 Secondary malignant neoplasm of brain: Secondary | ICD-10-CM | POA: Insufficient documentation

## 2021-11-28 MED ORDER — GADOBUTROL 1 MMOL/ML IV SOLN
8.5000 mL | Freq: Once | INTRAVENOUS | Status: AC | PRN
  Administered 2021-11-28: 8.5 mL via INTRAVENOUS

## 2021-12-02 ENCOUNTER — Inpatient Hospital Stay: Payer: BC Managed Care – PPO | Attending: Hematology

## 2021-12-03 ENCOUNTER — Inpatient Hospital Stay
Admission: RE | Admit: 2021-12-03 | Discharge: 2021-12-03 | Disposition: A | Discharge status: Home / Self Care | Payer: BC Managed Care – PPO | Source: Ambulatory Visit | Attending: Urology | Admitting: Urology

## 2021-12-03 ENCOUNTER — Encounter: Payer: Self-pay | Admitting: Urology

## 2021-12-03 DIAGNOSIS — C3412 Malignant neoplasm of upper lobe, left bronchus or lung: Secondary | ICD-10-CM

## 2021-12-03 DIAGNOSIS — C7931 Secondary malignant neoplasm of brain: Secondary | ICD-10-CM

## 2021-12-03 NOTE — Progress Notes (Signed)
  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      Indication for treatment:  Curative, Radiotherapy concurrent with targeted therapy, Tagrisso    Radiation treatment dates:   09/19/21 - 11/04/21  Site/dose:   The primary tumor and involved mediastinal adenopathy were treated to 66 Gy in 33 fractions of 2 Gy.  Beams/energy:   A five field 3D conformal treatment arrangement was used delivering 6 and 10 MV photons.  Daily image-guidance CT was used to align the treatment with the targeted volume  Narrative: The patient tolerated radiation treatment relatively well.  The patient experienced some esophagitis characterized as moderate but tolerable with carafate.  The patient also noted fatigue.  Plan: The patient has completed radiation treatment. The patient will return to radiation oncology clinic for routine followup in one month. I advised her to call or return sooner if she has any questions or concerns related to her recovery or treatment.  ________________________________  Sheral Apley. Tammi Klippel, M.D.

## 2021-12-03 NOTE — Progress Notes (Signed)
Telephone appointment. I verified patient's identity and began nursing interview. Patient reports some mild dysphagia. No other issues reported at this time.  Meaningful use complete. Hysterectomy- NO chances of pregnancy.  Reminded patient of her 1:00pm-12/03/21 telephone appointment w/ Ashlyn Bruning PA-C. I left my extension [phone] in case patient needs anything. Patient verbalized understanding.  Patient contact [phone]

## 2021-12-03 NOTE — Progress Notes (Signed)
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  Interval Since Last Radiation:  3 months s/p SRS brain; 1 month s/p IMRT lung  09/19/21 - 11/04/21: The primary tumor in the LUL lung and involved mediastinal adenopathy were treated to 66 Gy in 33 fractions of 2 Gy.  08/27/21: Single fraction SRS Brain PTV7-8   05/08/21 - 05/13/21: Fractionated SRS Brain PTV1 - PTV6     Narrative:  The patient returns today for routine follow-up.  She tolerated radiation treatment relatively well.  The patient experienced some esophagitis characterized as moderate but tolerable with carafate.  The patient also noted fatigue and some blistering of her skin in the treatment field.   On review of systems, the patient states that she is doing well in general.  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  She did have some skin blistering in the radiation field on her chest but this has fully resolved at this point.  She denies recent headaches, dizziness/imbalance, changes in visual or auditory acuity, difficulty with speech, word finding, tremors or seizure activity.  She continues with mild to moderate fatigue but is overall, pleased with her progress to date.  She continues to tolerate the Tagrisso fairly well.    She had her recent posttreatment MRI brain scan on 11/28/2021 and this showed a new, punctate lesion in the left frontal lobe.  The previously treated left parietal lesion appears stable and other previously treated lesions are  decreased in size or resolved.  We reviewed these results today.                      ALLERGIES:  is allergic to celebrex [celecoxib] and minocycline.  Meds: Current Outpatient Medications  Medication Sig Dispense Refill   acetaminophen (TYLENOL) 500 MG tablet Take 500 mg by mouth every 6 (six) hours as needed for moderate pain or headache.     albuterol (VENTOLIN HFA) 108 (90 Base) MCG/ACT inhaler Inhale 2 puffs into the lungs every 4 (four) hours as needed.     ALPRAZolam (XANAX) 0.25 MG tablet Take 0.25 mg by mouth every 4 (four) hours as needed for anxiety. for anxiety     aspirin EC 81 MG tablet Take 81 mg by mouth daily. Swallow whole.     citalopram (CELEXA) 10 MG tablet Take 10 mg by mouth at bedtime.     clindamycin (CLEOCIN T) 1 % external solution Apply topically 2 (two) times daily. 30 mL 0   clindamycin-benzoyl peroxide (BENZACLIN) gel Apply to face 2 times a day 25 g 1   diclofenac sodium (VOLTAREN) 1 % GEL Apply topically as needed. To affected area     diphenhydrAMINE (BENADRYL) 25 MG tablet Take 25 mg by mouth See admin instructions. 25 mg at bedtime 25 mg as needed for itching     Docusate Sodium (STOOL SOFTENER LAXATIVE PO) Take 1 tablet by mouth as needed.     eletriptan (RELPAX) 20 MG tablet Take 20 mg by mouth as  needed for headache (at onset of migraine.). May repeat in 2 hours if needed.     fluconazole (DIFLUCAN) 200 MG tablet Take 1 tablet (200 mg total) by mouth daily. 14 tablet 0   fluticasone (FLONASE) 50 MCG/ACT nasal spray Place 2 sprays into both nostrils daily as needed for allergies.     HYDROcodone-acetaminophen (HYCET) 7.5-325 mg/15 ml solution Take 10 mLs by mouth every 4 (four) hours as needed for moderate pain. 473 mL 0   hydrOXYzine (ATARAX/VISTARIL) 25 MG tablet Take 25 mg by mouth See admin instructions. 25 mg at bedtime 25 mg during the day as needed for itching/anxiety     loratadine (CLARITIN) 10 MG tablet Take 10 mg by mouth daily.     magic  mouthwash (nystatin, lidocaine, diphenhydrAMINE, alum & mag hydroxide) suspension Swish and swallow 5 mls by mouth every 6 hours as needed 250 mL 1   magic mouthwash w/lidocaine SOLN Take 5 mLs by mouth every 6 hours as needed for mouth pain. Swish and Swallow (Patient not taking: Reported on 08/19/2021) 250 mL 1   montelukast (SINGULAIR) 10 MG tablet Take 10 mg by mouth at bedtime.     nystatin (MYCOSTATIN/NYSTOP) powder Apply 1 application topically 3 (three) times daily. 60 g 1   ondansetron (ZOFRAN-ODT) 4 MG disintegrating tablet DISSOLVE 1 TABLET IN MOUTH EVERY 8 HOURS AS NEEDED FOR NAUSEA AND VOMITING 18 tablet 0   osimertinib mesylate (TAGRISSO) 80 MG tablet Take 1 tablet (80 mg total) by mouth daily. 30 tablet 2   predniSONE (DELTASONE) 5 MG tablet Take 1 tablet (5 mg total) by mouth daily with breakfast. Take 2 tabs daily for 7 days then 1 tab daily 40 tablet 0   prochlorperazine (COMPAZINE) 10 MG tablet TAKE 1 TABLET BY MOUTH EVERY 6 HOURS AS NEEDED FOR NAUSEA FOR VOMITING 30 tablet 0   solifenacin (VESICARE) 10 MG tablet Take 10 mg by mouth every evening.     sucralfate (CARAFATE) 1 g tablet Take 1 tablet (1 g total) by mouth 4 (four) times daily -  with meals and at bedtime. 5 min before meals for radiation induced esophagitis 120 tablet 2   zolpidem (AMBIEN) 10 MG tablet Take 1 tablet (10 mg total) by mouth at bedtime as needed for sleep. 15 tablet 0   No current facility-administered medications for this encounter.    Physical Findings:  vitals were not taken for this visit.  Pain Assessment Pain Score: 0-No pain/10 Unable to assess due to telephone follow-up visit format.  Lab Findings: Lab Results  Component Value Date   WBC 2.6 (L) 11/13/2021   HGB 12.4 11/13/2021   HCT 37.2 11/13/2021   MCV 81.4 11/13/2021   PLT 153 11/13/2021     Radiographic Findings: MR Brain W Wo Contrast  Addendum Date: 11/29/2021   ADDENDUM REPORT: 11/29/2021 15:21 ADDENDUM: Dictation  transcription error. The new left frontal enhancement is on image 247 of series 1100. Electronically Signed   By: Macy Mis M.D.   On: 11/29/2021 15:21   Result Date: 11/29/2021 CLINICAL DATA:  Brain metastases, assess treatment response 3T SRS Protocol EXAM: MRI HEAD WITHOUT AND WITH CONTRAST TECHNIQUE: Multiplanar, multiecho pulse sequences of the brain and surrounding structures were obtained without and with intravenous contrast. CONTRAST:  8.61m GADAVIST GADOBUTROL 1 MMOL/ML IV SOLN COMPARISON:  08/16/2021 FINDINGS: Brain: New punctate focus of enhancement left frontal lobe (1100, image 47). Stable 4 mm left parietal lesion (image 235). Decreased peripherally enhancing inferior left frontal  lesion measuring 5 mm (image 158). Other foci of enhancement have resolved. Mild residual edema on the prior study no longer present. No acute infarction. Ventricles and sulci are stable in size and configuration. Vascular: Major vessel flow voids at the skull base are preserved. Skull and upper cervical spine: Normal marrow signal is preserved. Sinuses/Orbits: Paranasal sinuses are aerated. Orbits are unremarkable. Other: Sella is unremarkable.  Mastoid air cells are clear. IMPRESSION: New punctate left frontal lobe enhancement. Stable left parietal lesion. Other lesions are decreased or resolved. Electronically Signed: By: Macy Mis M.D. On: 11/29/2021 09:56    Impression/Plan: 1. 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 She appears to have recovered well from the effects of her recent chest radiation.  The esophagitis continues to improve and overall, she is pleased with her progress to date.  She continues to tolerate the Tagrisso fairly well and is scheduled for a follow-up visit with Dr. Burr Medico on 12/10/2021 with plans for disease restaging imaging prior to that visit.    We reviewed the results of her most recent MRI  brain scan which does show a new, punctate lesion in the left frontal lobe.  Her imaging was reviewed in our recent multidisciplinary brain conference on 12/02/2021 and consensus recommendation is to proceed with a single fraction SRS treatment to this new lesion.  We discussed this today and reviewed associated side effects to be expected with the treatment which she is familiar with, having completed similar treatments previously.  She appears to have a good understanding of her disease and our treatment recommendations.  She has provided verbal consent to proceed today in the office and will sign formal written consent at the time of her CT simulation on 12/04/2021.  Her treatment has been coordinated with her neurosurgeon, Dr. Reatha Armour for 12/10/2021. She is comfortable and in agreement with this plan but knows that she is welcome to call at anytime in the interim with any questions or concerns.    Nicholos Johns, PA-C

## 2021-12-04 ENCOUNTER — Other Ambulatory Visit: Payer: Self-pay

## 2021-12-04 ENCOUNTER — Encounter: Payer: Self-pay | Admitting: Licensed Clinical Social Worker

## 2021-12-04 ENCOUNTER — Ambulatory Visit
Admission: RE | Admit: 2021-12-04 | Discharge: 2021-12-04 | Disposition: A | Discharge status: Home / Self Care | Payer: BC Managed Care – PPO | Source: Ambulatory Visit | Attending: Radiation Oncology | Admitting: Radiation Oncology

## 2021-12-04 ENCOUNTER — Other Ambulatory Visit: Payer: Self-pay | Admitting: Radiation Therapy

## 2021-12-04 ENCOUNTER — Ambulatory Visit: Payer: BC Managed Care – PPO | Admitting: Urology

## 2021-12-04 VITALS — BP 132/61 | HR 69 | Temp 96.4°F | Resp 18 | Ht 60.0 in | Wt 242.0 lb

## 2021-12-04 DIAGNOSIS — C7931 Secondary malignant neoplasm of brain: Secondary | ICD-10-CM

## 2021-12-04 NOTE — Progress Notes (Signed)
[place] CSW Progress Note  Holiday representative received a call from pt requesting completed referral be submitted to [facility] in order for disability application to be initiated.  Referral sent and receipt of referral confirmed.  CSW to remain available throughout duration of treatment to assist as appropriate.    Henriette Combs, LCSW

## 2021-12-04 NOTE — Progress Notes (Signed)
  Radiation Oncology         (336) [phone] ________________________________  Name: Abigail Golden MRN: [id number]  Date: 12/04/2021  DOB: [birth year]  SIMULATION AND TREATMENT PLANNING NOTE    ICD-10-CM   1. 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  NARRATIVE:  The patient was brought to the [facility].  Identity was confirmed.  All relevant records and images related to the planned course of therapy were reviewed.  The patient freely provided informed written consent to proceed with treatment after reviewing the details related to the planned course of therapy. The consent form was witnessed and verified by the simulation staff. Intravenous access was established for contrast administration. Then, the patient was set-up in a stable reproducible supine position for radiation therapy.  A relocatable thermoplastic stereotactic head frame was fabricated for precise immobilization.  CT images were obtained.  Surface markings were placed.  The CT images were loaded into the planning software and fused with the patient's targeting MRI scan.  Then the target and avoidance structures were contoured.  Treatment planning then occurred.  The radiation prescription was entered and confirmed.  I have requested 3D planning  I have requested a DVH of the following structures: Brain stem, brain, left eye, right eye, lenses, optic chiasm, target volumes, uninvolved brain, and normal tissue.    SPECIAL TREATMENT PROCEDURE:  The planned course of therapy using radiation constitutes a special treatment procedure. Special care is required in the management of this patient for the following reasons. This treatment constitutes a Special Treatment Procedure for the following reason: High dose per fraction  requiring special monitoring for increased toxicities of treatment including daily imaging.  The special nature of the planned course of radiotherapy will require increased physician supervision and oversight to ensure patient's safety with optimal treatment outcomes.  This requires extended time and effort.  PLAN:  The patient will receive 20 Gy in 1 fraction.  ________________________________  Sheral Apley Tammi Klippel, M.D.

## 2021-12-04 NOTE — Addendum Note (Signed)
Encounter addended by: Tyler Pita, MD on: 12/04/2021 3:40 PM  Actions taken: Problem List reviewed, Medication List reviewed, Allergies reviewed, Clinical Note Signed

## 2021-12-04 NOTE — Progress Notes (Addendum)
Patient to nursing for IV start was a difficult stick.  Unsuccessful attempt and IV team not available will proceed with CT simulation without contrast per Dr. Tammi Klippel.

## 2021-12-06 DIAGNOSIS — C7931 Secondary malignant neoplasm of brain: Secondary | ICD-10-CM | POA: Diagnosis not present

## 2021-12-10 ENCOUNTER — Ambulatory Visit
Admission: RE | Admit: 2021-12-10 | Discharge: 2021-12-10 | Disposition: A | Discharge status: Home / Self Care | Payer: BC Managed Care – PPO | Source: Ambulatory Visit | Attending: Radiation Oncology | Admitting: Radiation Oncology

## 2021-12-10 ENCOUNTER — Other Ambulatory Visit: Payer: Self-pay

## 2021-12-10 ENCOUNTER — Inpatient Hospital Stay: Payer: BC Managed Care – PPO

## 2021-12-10 ENCOUNTER — Encounter: Payer: Self-pay | Admitting: Hematology

## 2021-12-10 ENCOUNTER — Inpatient Hospital Stay (HOSPITAL_BASED_OUTPATIENT_CLINIC_OR_DEPARTMENT_OTHER): Payer: BC Managed Care – PPO | Admitting: Hematology

## 2021-12-10 ENCOUNTER — Other Ambulatory Visit (HOSPITAL_COMMUNITY): Payer: Self-pay

## 2021-12-10 VITALS — BP 139/68 | HR 70 | Temp 97.5°F | Resp 18 | Ht 60.0 in | Wt 244.8 lb

## 2021-12-10 VITALS — BP 129/60 | HR 66 | Temp 98.0°F | Resp 18

## 2021-12-10 DIAGNOSIS — R5383 Other fatigue: Secondary | ICD-10-CM | POA: Insufficient documentation

## 2021-12-10 DIAGNOSIS — L299 Pruritus, unspecified: Secondary | ICD-10-CM | POA: Insufficient documentation

## 2021-12-10 DIAGNOSIS — C7931 Secondary malignant neoplasm of brain: Secondary | ICD-10-CM | POA: Diagnosis present

## 2021-12-10 DIAGNOSIS — C3412 Malignant neoplasm of upper lobe, left bronchus or lung: Secondary | ICD-10-CM | POA: Insufficient documentation

## 2021-12-10 DIAGNOSIS — R634 Abnormal weight loss: Secondary | ICD-10-CM | POA: Diagnosis not present

## 2021-12-10 DIAGNOSIS — Z79899 Other long term (current) drug therapy: Secondary | ICD-10-CM | POA: Insufficient documentation

## 2021-12-10 DIAGNOSIS — R630 Anorexia: Secondary | ICD-10-CM | POA: Insufficient documentation

## 2021-12-10 DIAGNOSIS — Z510 Encounter for antineoplastic radiation therapy: Secondary | ICD-10-CM | POA: Diagnosis present

## 2021-12-10 LAB — CBC WITH DIFFERENTIAL (CANCER CENTER ONLY)
Abs Immature Granulocytes: 0.01 10*3/uL (ref 0.00–0.07)
Basophils Absolute: 0 10*3/uL (ref 0.0–0.1)
Basophils Relative: 0 %
Eosinophils Absolute: 0 10*3/uL (ref 0.0–0.5)
Eosinophils Relative: 0 %
HCT: 36.1 % (ref 36.0–46.0)
Hemoglobin: 11.9 g/dL — ABNORMAL LOW (ref 12.0–15.0)
Immature Granulocytes: 0 %
Lymphocytes Relative: 23 %
Lymphs Abs: 0.7 10*3/uL (ref 0.7–4.0)
MCH: 27.4 pg (ref 26.0–34.0)
MCHC: 33 g/dL (ref 30.0–36.0)
MCV: 83 fL (ref 80.0–100.0)
Monocytes Absolute: 0.4 10*3/uL (ref 0.1–1.0)
Monocytes Relative: 12 %
Neutro Abs: 2.1 10*3/uL (ref 1.7–7.7)
Neutrophils Relative %: 65 %
Platelet Count: 172 10*3/uL (ref 150–400)
RBC: 4.35 MIL/uL (ref 3.87–5.11)
RDW: 16.7 % — ABNORMAL HIGH (ref 11.5–15.5)
WBC Count: 3.2 10*3/uL — ABNORMAL LOW (ref 4.0–10.5)
nRBC: 0 % (ref 0.0–0.2)

## 2021-12-10 LAB — RAD ONC ARIA SESSION SUMMARY
Course Elapsed Days: 0
Plan Fractions Treated to Date: 1
Plan Prescribed Dose Per Fraction: 20 Gy
Plan Total Fractions Prescribed: 1
Plan Total Prescribed Dose: 20 Gy
Reference Point Dosage Given to Date: 20 Gy
Reference Point Session Dosage Given: 20 Gy
Session Number: 1

## 2021-12-10 LAB — CMP (CANCER CENTER ONLY)
ALT: 19 U/L (ref 0–44)
AST: 23 U/L (ref 15–41)
Albumin: 4 g/dL (ref 3.5–5.0)
Alkaline Phosphatase: 60 U/L (ref 38–126)
Anion gap: 6 (ref 5–15)
BUN: 12 mg/dL (ref 6–20)
CO2: 27 mmol/L (ref 22–32)
Calcium: 9.3 mg/dL (ref 8.9–10.3)
Chloride: 107 mmol/L (ref 98–111)
Creatinine: 1.19 mg/dL — ABNORMAL HIGH (ref 0.44–1.00)
GFR, Estimated: 54 mL/min — ABNORMAL LOW (ref >60)
Glucose, Bld: 117 mg/dL — ABNORMAL HIGH (ref 70–99)
Potassium: 3.7 mmol/L (ref 3.5–5.1)
Sodium: 140 mmol/L (ref 135–145)
Total Bilirubin: 0.5 mg/dL (ref 0.3–1.2)
Total Protein: 7 g/dL (ref 6.5–8.1)

## 2021-12-10 MED ORDER — ZOLPIDEM TARTRATE 10 MG PO TABS: 10.0000 mg | ORAL_TABLET | Freq: Every evening | ORAL | 0 refills | Status: DC | PRN

## 2021-12-10 NOTE — Progress Notes (Signed)
Patient to nursing for 30 minute observation Abigail Golden.  Denies headache, fatigue, vision changes, and nausea.  Speech is go and gait stable ambulates without difficulty out of the clinic.

## 2021-12-10 NOTE — Progress Notes (Signed)
  Radiation Oncology         [phone] ________________________________  Stereotactic Treatment Procedure Note  Name: Abigail Golden MRN: [id number]  Date: 12/10/2021  DOB: Feb 13, 1966  SPECIAL TREATMENT PROCEDURE    ICD-10-CM   1. Malignant neoplasm metastatic to brain [facility])  C79.31       3D TREATMENT PLANNING AND DOSIMETRY:  The patient's radiation plan was reviewed and approved by neurosurgery and radiation oncology prior to treatment.  It showed 3-dimensional radiation distributions overlaid onto the planning CT/MRI image set.  The [facility] for the target structures as well as the organs at risk were reviewed. The documentation of the 3D plan and dosimetry are filed in the radiation oncology EMR.  NARRATIVE:  Abigail Golden was brought to the TrueBeam stereotactic radiation treatment machine and placed supine on the CT couch. The head frame was applied, and the patient was set up for stereotactic radiosurgery.  Neurosurgery was present for the set-up and delivery  SIMULATION VERIFICATION:  In the couch zero-angle position, the patient underwent Exactrac imaging using the Brainlab system with orthogonal KV images.  These were carefully aligned and repeated to confirm treatment position for each of the isocenters.  The Exactrac snap film verification was repeated at each couch angle.  PROCEDURE: [name] received stereotactic radiosurgery to the following targets: Left frontal 2 mm target was treated using 4 Dynamic Conformal Arcs to a prescription dose of 20 Gy.  ExacTrac registration was performed for each couch angle.  The 100% isodose line was prescribed.  6 MV X-rays were delivered in the flattening filter free beam mode.  STEREOTACTIC TREATMENT MANAGEMENT:  Following delivery, the patient was transported to nursing in stable condition and monitored for possible acute effects.  Vital signs were recorded BP 129/60 (BP Location: Left Arm, Patient Position: Sitting)   Pulse 66    Temp 98 F (36.7 C) (Oral)   Resp 18   SpO2 100% . The patient tolerated treatment without significant acute effects, and was discharged to home in stable condition.    PLAN: Follow-up in one month.  ________________________________  Sheral Apley. Tammi Klippel, M.D.

## 2021-12-10 NOTE — Progress Notes (Signed)
[place]   [phone] Fax:(336) [phone]   Clinic Follow up Note   Patient Care Team: Aletha Halim., PA-C as PCP - General (Family Medicine) Valrie Hart, RN as Oncology Nurse Navigator (Oncology)  Date of Service:  12/10/2021  CHIEF COMPLAINT: f/u of metastatic lung cancer  CURRENT THERAPY:  Tagrisso, 80 mg daily, started 05/22/21  ASSESSMENT & PLAN:  Abigail Golden is a 56 y.o. female with   1. 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 Brain MRI showed multifocal metastatic disease. [See #3] -staging CT CAP 04/23/21 showed: 4.5 cm LUL mass that abuts pleural surface; no pathologically enlarged lymph nodes or metastatic disease. -bronchoscopy on 04/24/21 under Dr. Valeta Harms showed malignant cells consistent with adenocarcinoma, insufficient tissue for molecular studies. Guardant 360 was obtained and showed EGFR L858R mutation, which predicts good response to EGFR inhibitor. -PET scan 05/13/21 showed LUL mass extending along superior mediastinal border from hilum towards apex.  No lymph node or other distant metastasis. -she started Tagrisso on 05/22/21. She tolerates moderately well with mucositis and skin toxicities.  -PET scan on 08/28/21 showed: similar size and metabolic activity to primary left lung mass; new hypermetabolic ipsilateral nodal metastasis to prevascular nodal space, left lower paratracheal nodal station, and left supraclavicular.  -she completed concurrent radiation therapy to the LUL under Dr. Tammi Klippel 09/19/21 - 11/04/21. She is recovering well overall  -she will continue tagrisso. Labs reviewed, overall stable to improved.  -f/u in a moth, please to repeat CT scan in 2 months    2. Symptom Management: Fatigue, Low appetite causing weight loss,  itching -secondary to treatment -she reports issues sleeping. I called in [place] for her on 09/04/21. -weight is stable since completing radiation. -she has itching around her torso, which is very bothersome. I advised her to cry benadryl or pepcid.   3. Brain Metastases -brain MRI 04/23/21 showed 2 brain masses with moderate vasogenic edema left parietal lobe and left inferior frontal gyrus.  There were 2 other small metastases in the right frontal gyrus and right posterior temporal lobe and questionable 2 additional punctate metastases in the superior frontal gyrus. -she received three fractions of SRS under Dr. Tammi Klippel 11/16-11/21/22. -surveillance brain MRI on 08/16/21 revealed two new left frontal lobe metastases measuring 3-4 mm and decreased size or resolution of treated metastases. -she received one fraction of SRS to the new metastases on 08/27/21. -f/u brain MRI on 11/28/21 showed new punctate left frontal lobe enhancement. She is scheduled for one fraction of SRS today, 12/10/21.   4. 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 [name] is now 58 and she helps the patient due to her recent diagnosis. 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  -she is in touch with her father, [name], but Abigail Golden describes him as being unable to fully comprehend her situation. -The patient has strong social support at church and has several people that can drive her to appointments. -she is currently on short-term disability. -she recently moved back home (was living with [name]), reports she is able to care for herself.   5. Goal of care discussion, DNR -We previously discussed the incurable nature of her cancer, and the overall poor prognosis, especially if she does not have good response to chemotherapy or progress on chemo -The patient understands  the goal of care is palliative. -I recommend DNR/DNI, she has already filled out a living will and has named [name] her  HCPOA. She verbally agreed DNR again on 09/04/21.     Plan:  -proceed with SRS today -continue Tagrisso 63m daily -I refilled ambien -lab and f/u in 5 weeks   No problem-specific Assessment & Plan notes found for this encounter.   SUMMARY OF ONCOLOGIC HISTORY: Oncology History Overview Note   Cancer Staging  Primary adenocarcinoma of upper lobe of left lung ([facility] Staging form: Lung, AJCC 8th Edition - Clinical stage from 04/24/2021: Stage IVA (cT2, cN2, pM1b) - Signed by FTruitt Merle MD on 05/14/2021    Primary adenocarcinoma of upper lobe of left lung ([facility]  04/22/2021 Imaging   CT HEAD WO CONTRAST   IMPRESSION: Two separate areas of vasogenic edema within the left hemisphere, 1 within the inferior frontal region in the other at the left parietal vertex. Metastatic disease would be the most likely cause of this appearance. Other possibilities include venous infarctions and cerebritis. MRI with contrast recommended.    04/23/2021 Imaging   MR Brain W and Wo Contrast  IMPRESSION: 1. Metastatic disease to the brain. Two enhancing brain masses with moderate associated vasogenic edema: solid 16 mm left parietal lobe mass, and a larger 29 mm rim enhancing cystic or necrotic mass in the left inferior frontal gyrus. Two other small 2-3 mm metastases in the right inferior frontal gyrus, right posterior temporal lobe. And questionable two additional punctate metastases in both superior frontal gyri abutting the falx (series 18, image 46). 2. No significant intracranial mass effect. No other intracranial abnormality.   04/23/2021 Imaging   CT Chest W Contrast  IMPRESSION: 4.5 cm spiculated left upper lobe mass most compatible with primary lung cancer. This abuts the medial pleural surface. A few small adjacent pre-vascular mediastinal lymph nodes, none pathologically enlarged. Recommend cardiothoracic surgical and oncologic consultation.   No acute findings or evidence of metastatic  disease in the abdomen or pelvis.   04/24/2021 Cancer Staging   Staging form: Lung, AJCC 8th Edition - Clinical stage from 04/24/2021: Stage IVA (cT2, cN2, pM1b) - Signed by FTruitt Merle MD on 05/14/2021   04/24/2021 Initial Biopsy   FINAL MICROSCOPIC DIAGNOSIS:   A. LUNG, LUL, FINE NEEDLE ASPIRATION:  - Malignant cells present, consistent with adenocarcinoma   B. LUNG, LUL, BRUSHING:  - Malignant cells consistent with adenocarcinoma, see comment    COMMENT:  B.  Immunohistochemical stains show that the tumor cells are positive for TTF-1 while they are negative for p63 and CK5/6, consistent with above interpretation. The number of tumor cells appears to be insufficient for molecular studies.   05/03/2021 Initial Diagnosis   Lung cancer ([facility]   05/08/2021 - 05/13/2021 Radiation Therapy   SRS treatment to the metastatic brain lesions under the care of Dr. MTammi Klippel   08/05/2021 Imaging   EXAM: CT CHEST, ABDOMEN, AND PELVIS WITH CONTRAST  IMPRESSION: 1. No substantial change in size of the left upper lobe pulmonary lesion. 2. Borderline to mildly enlarged adjacent prevascular/AP window lymph nodes have increased in size in the interval. This finding raises concern for disease progression. 3. Interval development of upper normal bilateral common iliac and left external iliac lymph nodes. Close attention on follow-up recommended. 4. No other evidence for metastatic disease in the abdomen or pelvis.      INTERVAL HISTORY:  Abigail MUSGRAVEis here for a follow up of metastatic lung cancer. She  was last seen by me on 10/28/21 with visits with symptom management in the interim. She presents to the clinic accompanied by her friend [name]. She reports she is doing well overall, improving since completing radiation. She does report itching around her torso.   All other systems were reviewed with the patient and are negative.  MEDICAL HISTORY:  Past Medical History:  Diagnosis Date    Allergy    Anxiety    Chronic bronchitis ([place])    Depression    Headache    Hypoglycemia    Lower back pain    since fall ~ 2012 (05/25/2015)   Migraine    05/25/2015 "probably once/month"   Migraine headache    Pain in left hip    since fall ~ 2012 (05/25/2015)   Pneumonia "several times"    SURGICAL HISTORY: Past Surgical History:  Procedure Laterality Date   ABDOMINAL HYSTERECTOMY  11/2002   "found benign tumors"   BRONCHIAL BIOPSY  04/24/2021   Procedure: BRONCHIAL BIOPSIES;  Surgeon: Garner Nash, DO;  Location: [facility];  Service: Pulmonary;;   BRONCHIAL BRUSHINGS  04/24/2021   Procedure: BRONCHIAL BRUSHINGS;  Surgeon: Garner Nash, DO;  Location: [facility];  Service: Pulmonary;;   BRONCHIAL NEEDLE ASPIRATION BIOPSY  04/24/2021   Procedure: BRONCHIAL NEEDLE ASPIRATION BIOPSIES;  Surgeon: Garner Nash, DO;  Location: [facility];  Service: Pulmonary;;   CHEST TUBE INSERTION  04/24/2021   Procedure: CHEST TUBE INSERTION;  Surgeon: Garner Nash, DO;  Location: [facility];  Service: Pulmonary;;   CHOLECYSTECTOMY N/A 05/25/2015   Procedure: LAPAROSCOPIC CHOLECYSTECTOMY;  Surgeon: Ralene Ok, MD;  Location: [facility];  Service: General;  Laterality: N/A;   LAPAROSCOPIC CHOLECYSTECTOMY  05/25/2015   MENISCUS REPAIR Right 03/2017   VIDEO BRONCHOSCOPY WITH ENDOBRONCHIAL NAVIGATION N/A 04/24/2021   Procedure: VIDEO BRONCHOSCOPY WITH ENDOBRONCHIAL NAVIGATION;  Surgeon: Garner Nash, DO;  Location: [facility];  Service: Pulmonary;  Laterality: N/A;   VIDEO BRONCHOSCOPY WITH RADIAL ENDOBRONCHIAL ULTRASOUND  04/24/2021   Procedure: VIDEO BRONCHOSCOPY WITH RADIAL ENDOBRONCHIAL ULTRASOUND;  Surgeon: Garner Nash, DO;  Location: [facility];  Service: Pulmonary;;    I have reviewed the social history and family history with the patient and they are unchanged from previous note.  ALLERGIES:  is allergic to celebrex [celecoxib] and minocycline.  MEDICATIONS:   Current Outpatient Medications  Medication Sig Dispense Refill   acetaminophen (TYLENOL) 500 MG tablet Take 500 mg by mouth every 6 (six) hours as needed for moderate pain or headache.     albuterol (VENTOLIN HFA) 108 (90 Base) MCG/ACT inhaler Inhale 2 puffs into the lungs every 4 (four) hours as needed.     ALPRAZolam (XANAX) 0.25 MG tablet Take 0.25 mg by mouth every 4 (four) hours as needed for anxiety. for anxiety     aspirin EC 81 MG tablet Take 81 mg by mouth daily. Swallow whole.     citalopram (CELEXA) 10 MG tablet Take 10 mg by mouth at bedtime.     clindamycin (CLEOCIN T) 1 % external solution Apply topically 2 (two) times daily. 30 mL 0   clindamycin-benzoyl peroxide (BENZACLIN) gel Apply to face 2 times a day 25 g 1   diclofenac sodium (VOLTAREN) 1 % GEL Apply topically as needed. To affected area     diphenhydrAMINE (BENADRYL) 25 MG tablet Take 25 mg by mouth See admin instructions. 25 mg at bedtime 25 mg as needed for itching     Docusate Sodium (STOOL SOFTENER  LAXATIVE PO) Take 1 tablet by mouth as needed.     eletriptan (RELPAX) 20 MG tablet Take 20 mg by mouth as needed for headache (at onset of migraine.). May repeat in 2 hours if needed.     fluconazole (DIFLUCAN) 200 MG tablet Take 1 tablet (200 mg total) by mouth daily. 14 tablet 0   fluticasone (FLONASE) 50 MCG/ACT nasal spray Place 2 sprays into both nostrils daily as needed for allergies.     HYDROcodone-acetaminophen (HYCET) 7.5-325 mg/15 ml solution Take 10 mLs by mouth every 4 (four) hours as needed for moderate pain. 473 mL 0   hydrOXYzine (ATARAX/VISTARIL) 25 MG tablet Take 25 mg by mouth See admin instructions. 25 mg at bedtime 25 mg during the day as needed for itching/anxiety     loratadine (CLARITIN) 10 MG tablet Take 10 mg by mouth daily.     magic mouthwash (nystatin, lidocaine, diphenhydrAMINE, alum & mag hydroxide) suspension Swish and swallow 5 mls by mouth every 6 hours as needed 250 mL 1   magic  mouthwash w/lidocaine SOLN Take 5 mLs by mouth every 6 hours as needed for mouth pain. Swish and Swallow (Patient not taking: Reported on 08/19/2021) 250 mL 1   montelukast (SINGULAIR) 10 MG tablet Take 10 mg by mouth at bedtime.     nystatin (MYCOSTATIN/NYSTOP) powder Apply 1 application topically 3 (three) times daily. 60 g 1   ondansetron (ZOFRAN-ODT) 4 MG disintegrating tablet DISSOLVE 1 TABLET IN MOUTH EVERY 8 HOURS AS NEEDED FOR NAUSEA AND VOMITING 18 tablet 0   osimertinib mesylate (TAGRISSO) 80 MG tablet Take 1 tablet (80 mg total) by mouth daily. 30 tablet 2   predniSONE (DELTASONE) 5 MG tablet Take 1 tablet (5 mg total) by mouth daily with breakfast. Take 2 tabs daily for 7 days then 1 tab daily 40 tablet 0   prochlorperazine (COMPAZINE) 10 MG tablet TAKE 1 TABLET BY MOUTH EVERY 6 HOURS AS NEEDED FOR NAUSEA FOR VOMITING 30 tablet 0   solifenacin (VESICARE) 10 MG tablet Take 10 mg by mouth every evening.     sucralfate (CARAFATE) 1 g tablet Take 1 tablet (1 g total) by mouth 4 (four) times daily -  with meals and at bedtime. 5 min before meals for radiation induced esophagitis 120 tablet 2   zolpidem (AMBIEN) 10 MG tablet Take 1 tablet (10 mg total) by mouth at bedtime as needed for sleep. 20 tablet 0   No current facility-administered medications for this visit.    PHYSICAL EXAMINATION: ECOG PERFORMANCE STATUS: 2 - Symptomatic, <50% confined to bed  Vitals:   12/10/21 1416  BP: 139/68  Pulse: 70  Resp: 18  Temp: (!) 97.5 F (36.4 C)  SpO2: 100%   Wt Readings from Last 3 Encounters:  12/10/21 244 lb 12.8 oz (111 kg)  12/04/21 242 lb (109.8 kg)  12/04/21 242 lb (109.8 kg)     GENERAL:alert, no distress and comfortable SKIN: skin color, texture, turgor are normal, no significant lesions, (+) erythema to b/l hips EYES: normal, Conjunctiva are pink and non-injected, sclera clear  LUNGS: clear to auscultation and percussion with normal breathing effort HEART: regular rate &  rhythm and no murmurs and no lower extremity edema NEURO: alert & oriented x 3 with fluent speech, no focal motor/sensory deficits  LABORATORY DATA:  I have reviewed the data as listed    Latest Ref Rng & Units 12/10/2021    1:59 PM 11/13/2021    1:27 PM 11/11/2021  1:59 PM  CBC  WBC 4.0 - 10.5 K/uL 3.2  2.6  3.3   Hemoglobin 12.0 - 15.0 g/dL 11.9  12.4  11.9   Hematocrit 36.0 - 46.0 % 36.1  37.2  35.6   Platelets 150 - 400 K/uL 172  153  148         Latest Ref Rng & Units 12/10/2021    1:59 PM 11/13/2021    1:27 PM 11/11/2021   12:29 PM  CMP  Glucose 70 - 99 mg/dL 117  114  133   BUN 6 - 20 mg/dL _0 Creatinine 0.44 - 1.00 mg/dL 1.19  1.57  1.72   Sodium 135 - 145 mmol/L 140  142  138   Potassium 3.5 - 5.1 mmol/L 3.7  4.3  4.0   Chloride 98 - 111 mmol/L 107  108  104   CO2 22 - 32 mmol/L _1 Calcium 8.9 - 10.3 mg/dL 9.3  9.7  10.1   Total Protein 6.5 - 8.1 g/dL 7.0  7.9  8.2   Total Bilirubin 0.3 - 1.2 mg/dL 0.5  0.6  0.5   Alkaline Phos 38 - 126 U/L 60  70  79   AST 15 - 41 U/L _2 ALT 0 - 44 U/L _3 RADIOGRAPHIC STUDIES: I have personally reviewed the radiological images as listed and agreed with the findings in the report. No results found.    No orders of the defined types were placed in this encounter.  All questions were answered. The patient knows to call the clinic with any problems, questions or concerns. No barriers to learning was detected. The total time spent in the appointment was 30 minutes.     Truitt Merle, MD 12/10/2021   I, Wilburn Mylar, am acting as scribe for Truitt Merle, MD.   I have reviewed the above documentation for accuracy and completeness, and I agree with the above.

## 2021-12-12 ENCOUNTER — Other Ambulatory Visit (HOSPITAL_COMMUNITY): Payer: Self-pay

## 2021-12-13 ENCOUNTER — Telehealth: Payer: Self-pay | Admitting: Radiation Therapy

## 2021-12-13 ENCOUNTER — Other Ambulatory Visit: Payer: Self-pay | Admitting: Urology

## 2021-12-13 MED ORDER — HYDROCODONE-ACETAMINOPHEN 7.5-325 MG/15ML PO SOLN: 10.0000 mL | ORAL | 0 refills | Status: DC | PRN

## 2021-12-16 ENCOUNTER — Encounter: Payer: Self-pay | Admitting: *Deleted

## 2021-12-18 NOTE — Addendum Note (Signed)
Encounter addended by: Karsten Ro, DO on: 12/18/2021 9:15 AM  Actions taken: Clinical Note Signed

## 2021-12-18 NOTE — Op Note (Signed)
Name: Abigail Golden    MRN: [id number]   Date: 12/10/2021    DOB: Jan 17, 1966   STEREOTACTIC RADIOSURGERY OPERATIVE NOTE  PRE-OPERATIVE DIAGNOSIS:  Metastatic lung adenocarcinoma to the brain, left frontal, one lesion  POST-OPERATIVE DIAGNOSIS:  Same  PROCEDURE:  Stereotactic Radiosurgery  SURGEON:  Elwin Sleight, DO  RADIATION ONCOLOGIST: Dr. Tammi Klippel  TECHNIQUE:  The patient underwent a radiation treatment planning session in the radiation oncology simulation suite under the care of the radiation oncology physician and physicist.  I participated closely in the radiation treatment planning afterwards. The patient underwent planning CT which was fused to 3T high resolution MRI with 1 mm axial slices.  These images were fused on the planning system.  We contoured the gross target volumes and subsequently expanded this to yield the Planning Target Volume. I actively participated in the planning process.  I helped to define and review the target contours and also the contours of the optic pathway, eyes, brainstem and selected nearby organs at risk.  All the dose constraints for critical structures were reviewed and compared to AAPM Task Group 101.  The prescription dose conformity was reviewed.  I approved the plan electronically.    Accordingly, Abigail Golden  was brought to the TrueBeam stereotactic radiation treatment linac and placed in the custom immobilization mask.  The patient was aligned according to the IR fiducial markers with BrainLab Exactrac, then orthogonal x-rays were used in ExacTrac with the 6DOF robotic table and the shifts were made to align the patient.  Abigail Golden received stereotactic radiosurgery to a prescription dose of 20Gy uneventfully to a single left frontal lesion.    The detailed description of the procedure is recorded in the radiation oncology procedure note.  I was present for the duration of the procedure.  DISPOSITION:   Following delivery, the patient was  transported to nursing in stable condition and monitored for possible acute effects to be discharged to home in stable condition with follow-up in one month.  Elwin Sleight, [facility]

## 2022-01-02 ENCOUNTER — Other Ambulatory Visit (HOSPITAL_COMMUNITY): Payer: Self-pay

## 2022-01-09 ENCOUNTER — Other Ambulatory Visit (HOSPITAL_COMMUNITY): Payer: Self-pay

## 2022-01-10 ENCOUNTER — Telehealth: Payer: Self-pay

## 2022-01-10 NOTE — Telephone Encounter (Signed)
Pt called stating she would like a prescription for a cough syrup d/t pt has been coughing since she's come home from her [place] trip.  Pt stated her O2 SATS have been between 97% to 98%.  Pt stated she's used her inhalers 2 days this week and any SOB she was having at that time was resolved.  Pt denied fevers or taking anything for her cough.  Pt stated her cough increases at night.  Recommended that the pt takes Robitussin DM, cough drops, drinking hot Tea w/lemon & honey, and drinking water/thin liquids to help break the inflammation up in her chest.  Also, recommended the pt to take Mucinex D or DM to help break the inflammation up in her chest.  Pt verbalized understanding and had no further questions at this time.  Informed pt that this RN will notify Dr. Burr Medico of her symptoms.  Notified Dr. Burr Medico.

## 2022-01-13 ENCOUNTER — Other Ambulatory Visit (HOSPITAL_COMMUNITY): Payer: Self-pay

## 2022-01-14 ENCOUNTER — Other Ambulatory Visit (HOSPITAL_COMMUNITY): Payer: Self-pay

## 2022-01-14 ENCOUNTER — Other Ambulatory Visit: Payer: Self-pay

## 2022-01-14 ENCOUNTER — Encounter: Payer: Self-pay | Admitting: Hematology

## 2022-01-14 ENCOUNTER — Inpatient Hospital Stay: Payer: BC Managed Care – PPO

## 2022-01-14 ENCOUNTER — Inpatient Hospital Stay: Payer: BC Managed Care – PPO | Attending: Hematology | Admitting: Hematology

## 2022-01-14 VITALS — BP 139/55 | HR 79 | Temp 98.2°F | Resp 20 | Ht 60.0 in | Wt 240.6 lb

## 2022-01-14 DIAGNOSIS — C3412 Malignant neoplasm of upper lobe, left bronchus or lung: Secondary | ICD-10-CM | POA: Insufficient documentation

## 2022-01-14 DIAGNOSIS — C7931 Secondary malignant neoplasm of brain: Secondary | ICD-10-CM | POA: Insufficient documentation

## 2022-01-14 DIAGNOSIS — Z923 Personal history of irradiation: Secondary | ICD-10-CM | POA: Insufficient documentation

## 2022-01-14 LAB — CBC WITH DIFFERENTIAL (CANCER CENTER ONLY)
Abs Immature Granulocytes: 0.01 10*3/uL (ref 0.00–0.07)
Basophils Absolute: 0 10*3/uL (ref 0.0–0.1)
Basophils Relative: 0 %
Eosinophils Absolute: 0 10*3/uL (ref 0.0–0.5)
Eosinophils Relative: 0 %
HCT: 30 % — ABNORMAL LOW (ref 36.0–46.0)
Hemoglobin: 10 g/dL — ABNORMAL LOW (ref 12.0–15.0)
Immature Granulocytes: 0 %
Lymphocytes Relative: 23 %
Lymphs Abs: 0.6 10*3/uL — ABNORMAL LOW (ref 0.7–4.0)
MCH: 28.3 pg (ref 26.0–34.0)
MCHC: 33.3 g/dL (ref 30.0–36.0)
MCV: 85 fL (ref 80.0–100.0)
Monocytes Absolute: 0.4 10*3/uL (ref 0.1–1.0)
Monocytes Relative: 14 %
Neutro Abs: 1.5 10*3/uL — ABNORMAL LOW (ref 1.7–7.7)
Neutrophils Relative %: 63 %
Platelet Count: 180 10*3/uL (ref 150–400)
RBC: 3.53 MIL/uL — ABNORMAL LOW (ref 3.87–5.11)
RDW: 14.1 % (ref 11.5–15.5)
WBC Count: 2.5 10*3/uL — ABNORMAL LOW (ref 4.0–10.5)
nRBC: 0 % (ref 0.0–0.2)

## 2022-01-14 LAB — CMP (CANCER CENTER ONLY)
ALT: 13 U/L (ref 0–44)
AST: 19 U/L (ref 15–41)
Albumin: 4 g/dL (ref 3.5–5.0)
Alkaline Phosphatase: 74 U/L (ref 38–126)
Anion gap: 7 (ref 5–15)
BUN: 12 mg/dL (ref 6–20)
CO2: 28 mmol/L (ref 22–32)
Calcium: 9.1 mg/dL (ref 8.9–10.3)
Chloride: 103 mmol/L (ref 98–111)
Creatinine: 0.99 mg/dL (ref 0.44–1.00)
GFR, Estimated: >60 mL/min (ref >60)
Glucose, Bld: 107 mg/dL — ABNORMAL HIGH (ref 70–99)
Potassium: 3.7 mmol/L (ref 3.5–5.1)
Sodium: 138 mmol/L (ref 135–145)
Total Bilirubin: 0.5 mg/dL (ref 0.3–1.2)
Total Protein: 6.9 g/dL (ref 6.5–8.1)

## 2022-01-14 MED ORDER — ONDANSETRON 4 MG PO TBDP: ORAL_TABLET | ORAL | 1 refills | Status: DC

## 2022-01-14 MED ORDER — OSIMERTINIB MESYLATE 80 MG PO TABS
80.0000 mg | ORAL_TABLET | Freq: Every day | ORAL | 2 refills | Status: DC
  Filled 2022-01-14 – 2022-02-06 (×2): qty 30, 30d supply, fill #0
  Filled 2022-03-04: qty 30, 30d supply, fill #1
  Filled 2022-04-02: qty 30, 30d supply, fill #2

## 2022-01-14 MED ORDER — PROCHLORPERAZINE MALEATE 10 MG PO TABS: ORAL_TABLET | ORAL | 1 refills | Status: AC

## 2022-01-14 NOTE — Progress Notes (Signed)
[place]   [phone]) [phone] [phone]   Clinic Follow up Note   Patient Care Team: Aletha Halim., PA-C as PCP - General (Family Medicine) Truitt Merle, MD as Consulting Physician (Hematology and Oncology) Tyler Pita, MD as Consulting Physician (Radiation Oncology)  Date of Service:  01/14/2022  CHIEF COMPLAINT: f/u of metastatic lung cancer  CURRENT THERAPY:  Tagrisso, 80 mg daily, started 05/22/21  ASSESSMENT & PLAN:  Abigail Golden is a 56 y.o. female with   1. 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 Brain MRI showed multifocal metastatic disease. [See #3] -staging CT CAP 04/23/21 showed: 4.5 cm LUL mass that abuts pleural surface; no lymphadenopathy or metastatic disease. -bronchoscopy on 04/24/21 under Dr. Valeta Harms, path showed adenocarcinoma, but insufficient tissue for molecular studies. Guardant 360 showed EGFR L858R mutation, which predicts good response to EGFR inhibitor. -PET scan 05/13/21 showed LUL mass extending along superior mediastinal border from hilum towards apex.  No lymph node or other distant metastasis. -she started Tagrisso on 05/22/21. She tolerates moderately well with mucositis and skin toxicities.  -PET scan on 08/28/21 showed: similar size and metabolic activity to primary left lung mass; new hypermetabolic ipsilateral nodal metastasis to prevascular nodal space, left lower paratracheal nodal station, and left supraclavicular.  -she completed concurrent radiation therapy to the LUL under Dr. Tammi Klippel 09/19/21 - 11/04/21. She has recovered well -she developed dry cough when she returned from her mission trip last week, improving now, she is otherwise clinically stable.  -she will continue tagrisso. Labs reviewed, overall stable to improved.  -f/u in a month,  please to repeat CT scan before next visit   2. Symptom Management: Fatigue, Low appetite causing weight loss, itching -secondary to treatment -she reports issues sleeping. I called in [place] for her on 09/04/21. -weight is stable since completing radiation. -she has itching around her torso, which is very bothersome. I advised her to cry benadryl or pepcid.   3. Brain Metastases -initially seen on brain MRI 04/23/21 -s/p SRS under Dr. Tammi Klippel 05/08/21 - 05/13/21 (3 fractions), 08/27/21, and 12/10/21.   4. 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 -she's in touch with her father, but he is not involved in her care. -she is currently on short-term disability. -she recently moved back home (was living with [place]), reports she is able to care for herself.   5. Goal of care discussion, DNR -We previously discussed the incurable nature of her cancer, and the overall poor prognosis, especially if she does not have good response to chemotherapy or progress on chemo -The patient understands the goal of care is palliative. -I recommend DNR/DNI, she has already filled out a living will and has named Karlye her HCPOA. She verbally agreed DNR again on 09/04/21.     Plan:  -continue Tagrisso 47m daily -f/u in 4 weeks, with lab and restaging CT CAP w contrast several days before, I gave her oral contrast today     No problem-specific Assessment & Plan notes found for this encounter.   SUMMARY OF ONCOLOGIC HISTORY: Oncology History Overview Note   Cancer Staging  Primary adenocarcinoma of upper lobe of left lung ([place] Staging form: Lung, AJCC 8th Edition - Clinical stage from 04/24/2021: Stage IVA (cT2, cN2, pM1b) - Signed  by Truitt Merle, MD on 05/14/2021    Primary adenocarcinoma of upper lobe of left lung ([place])  04/22/2021 Imaging   CT HEAD WO CONTRAST   IMPRESSION: Two separate areas of vasogenic edema  within the left hemisphere, 1 within the inferior frontal region in the other at the left parietal vertex. Metastatic disease would be the most likely cause of this appearance. Other possibilities include venous infarctions and cerebritis. MRI with contrast recommended.    04/23/2021 Imaging   MR Brain W and Wo Contrast  IMPRESSION: 1. Metastatic disease to the brain. Two enhancing brain masses with moderate associated vasogenic edema: solid 16 mm left parietal lobe mass, and a larger 29 mm rim enhancing cystic or necrotic mass in the left inferior frontal gyrus. Two other small 2-3 mm metastases in the right inferior frontal gyrus, right posterior temporal lobe. And questionable two additional punctate metastases in both superior frontal gyri abutting the falx (series 18, image 46). 2. No significant intracranial mass effect. No other intracranial abnormality.   04/23/2021 Imaging   CT Chest W Contrast  IMPRESSION: 4.5 cm spiculated left upper lobe mass most compatible with primary lung cancer. This abuts the medial pleural surface. A few small adjacent pre-vascular mediastinal lymph nodes, none pathologically enlarged. Recommend cardiothoracic surgical and oncologic consultation.   No acute findings or evidence of metastatic disease in the abdomen or pelvis.   04/24/2021 Cancer Staging   Staging form: Lung, AJCC 8th Edition - Clinical stage from 04/24/2021: Stage IVA (cT2, cN2, pM1b) - Signed by Truitt Merle, MD on 05/14/2021   04/24/2021 Initial Biopsy   FINAL MICROSCOPIC DIAGNOSIS:   A. LUNG, LUL, FINE NEEDLE ASPIRATION:  - Malignant cells present, consistent with adenocarcinoma   B. LUNG, LUL, BRUSHING:  - Malignant cells consistent with adenocarcinoma, see comment    COMMENT:  B.  Immunohistochemical stains show that the tumor cells are positive for TTF-1 while they are negative for p63 and CK5/6, consistent with above interpretation. The number of tumor cells appears to be  insufficient for molecular studies.   05/03/2021 Initial Diagnosis   Lung cancer ([place])   05/08/2021 - 05/13/2021 Radiation Therapy   SRS treatment to the metastatic brain lesions under the care of Dr. Tammi Klippel    08/05/2021 Imaging   EXAM: CT CHEST, ABDOMEN, AND PELVIS WITH CONTRAST  IMPRESSION: 1. No substantial change in size of the left upper lobe pulmonary lesion. 2. Borderline to mildly enlarged adjacent prevascular/AP window lymph nodes have increased in size in the interval. This finding raises concern for disease progression. 3. Interval development of upper normal bilateral common iliac and left external iliac lymph nodes. Close attention on follow-up recommended. 4. No other evidence for metastatic disease in the abdomen or pelvis.      INTERVAL HISTORY:  Abigail Golden is here for a follow up of metastatic lung cancer. She was last seen by me on 12/10/21. She presents to the clinic accompanied by her friend [name].  She returned from her mission trip last week, and a developed dry cough towards the end of the trip.  She denies any worsening shortness of breath, chest pain, fever, or sputum production.  I feel as a team member had a similar cough also.  She has not been tested for COVID.  She started taking some cough syrup and Mucinex last Friday, and her cough has much improved.  She is otherwise doing well, mild to moderate fatigue is stable, she lives alone now, able to manage  at home.   All other systems were reviewed with the patient and are negative.  MEDICAL HISTORY:  Past Medical History:  Diagnosis Date   Allergy    Anxiety    Chronic bronchitis ([place])    Depression    Headache    Hypoglycemia    Lower back pain    since fall ~ 2012 (05/25/2015)   Migraine    05/25/2015 "probably once/month"   Migraine headache    Pain in left hip    since fall ~ 2012 (05/25/2015)   Pneumonia "several times"    SURGICAL HISTORY: Past Surgical History:  Procedure  Laterality Date   ABDOMINAL HYSTERECTOMY  11/2002   "found benign tumors"   BRONCHIAL BIOPSY  04/24/2021   Procedure: BRONCHIAL BIOPSIES;  Surgeon: Garner Nash, DO;  Location: [facility];  Service: Pulmonary;;   BRONCHIAL BRUSHINGS  04/24/2021   Procedure: BRONCHIAL BRUSHINGS;  Surgeon: Garner Nash, DO;  Location: [facility];  Service: Pulmonary;;   BRONCHIAL NEEDLE ASPIRATION BIOPSY  04/24/2021   Procedure: BRONCHIAL NEEDLE ASPIRATION BIOPSIES;  Surgeon: Garner Nash, DO;  Location: [facility];  Service: Pulmonary;;   CHEST TUBE INSERTION  04/24/2021   Procedure: CHEST TUBE INSERTION;  Surgeon: Garner Nash, DO;  Location: [facility];  Service: Pulmonary;;   CHOLECYSTECTOMY N/A 05/25/2015   Procedure: LAPAROSCOPIC CHOLECYSTECTOMY;  Surgeon: Ralene Ok, MD;  Location: [facility];  Service: General;  Laterality: N/A;   LAPAROSCOPIC CHOLECYSTECTOMY  05/25/2015   MENISCUS REPAIR Right 03/2017   VIDEO BRONCHOSCOPY WITH ENDOBRONCHIAL NAVIGATION N/A 04/24/2021   Procedure: VIDEO BRONCHOSCOPY WITH ENDOBRONCHIAL NAVIGATION;  Surgeon: Garner Nash, DO;  Location: [facility];  Service: Pulmonary;  Laterality: N/A;   VIDEO BRONCHOSCOPY WITH RADIAL ENDOBRONCHIAL ULTRASOUND  04/24/2021   Procedure: VIDEO BRONCHOSCOPY WITH RADIAL ENDOBRONCHIAL ULTRASOUND;  Surgeon: Garner Nash, DO;  Location: [facility];  Service: Pulmonary;;    I have reviewed the social history and family history with the patient and they are unchanged from previous note.  ALLERGIES:  is allergic to celebrex [celecoxib] and minocycline.  MEDICATIONS:  Current Outpatient Medications  Medication Sig Dispense Refill   acetaminophen (TYLENOL) 500 MG tablet Take 500 mg by mouth every 6 (six) hours as needed for moderate pain or headache.     albuterol (VENTOLIN HFA) 108 (90 Base) MCG/ACT inhaler Inhale 2 puffs into the lungs every 4 (four) hours as needed.     ALPRAZolam (XANAX) 0.25 MG tablet Take 0.25 mg  by mouth every 4 (four) hours as needed for anxiety. for anxiety     aspirin EC 81 MG tablet Take 81 mg by mouth daily. Swallow whole.     citalopram (CELEXA) 10 MG tablet Take 10 mg by mouth at bedtime.     clindamycin (CLEOCIN T) 1 % external solution Apply topically 2 (two) times daily. 30 mL 0   clindamycin-benzoyl peroxide (BENZACLIN) gel Apply to face 2 times a day 25 g 1   diclofenac sodium (VOLTAREN) 1 % GEL Apply topically as needed. To affected area     diphenhydrAMINE (BENADRYL) 25 MG tablet Take 25 mg by mouth See admin instructions. 25 mg at bedtime 25 mg as needed for itching     Docusate Sodium (STOOL SOFTENER LAXATIVE PO) Take 1 tablet by mouth as needed.     eletriptan (RELPAX) 20 MG tablet Take 20 mg by mouth as needed for headache (at onset of migraine.). May repeat in 2 hours if needed.  fluconazole (DIFLUCAN) 200 MG tablet Take 1 tablet (200 mg total) by mouth daily. 14 tablet 0   fluticasone (FLONASE) 50 MCG/ACT nasal spray Place 2 sprays into both nostrils daily as needed for allergies.     HYDROcodone-acetaminophen (HYCET) 7.5-325 mg/15 ml solution Take 10 mLs by mouth every 4 (four) hours as needed for moderate pain. 473 mL 0   hydrOXYzine (ATARAX/VISTARIL) 25 MG tablet Take 25 mg by mouth See admin instructions. 25 mg at bedtime 25 mg during the day as needed for itching/anxiety     loratadine (CLARITIN) 10 MG tablet Take 10 mg by mouth daily.     magic mouthwash (nystatin, lidocaine, diphenhydrAMINE, alum & mag hydroxide) suspension Swish and swallow 5 mls by mouth every 6 hours as needed 250 mL 1   magic mouthwash w/lidocaine SOLN Take 5 mLs by mouth every 6 hours as needed for mouth pain. Swish and Swallow (Patient not taking: Reported on 08/19/2021) 250 mL 1   montelukast (SINGULAIR) 10 MG tablet Take 10 mg by mouth at bedtime.     nystatin (MYCOSTATIN/NYSTOP) powder Apply 1 application topically 3 (three) times daily. 60 g 1   ondansetron (ZOFRAN-ODT) 4 MG  disintegrating tablet DISSOLVE 1 TABLET IN MOUTH EVERY 8 HOURS AS NEEDED FOR NAUSEA AND VOMITING 18 tablet 1   osimertinib mesylate (TAGRISSO) 80 MG tablet Take 1 tablet (80 mg total) by mouth daily. 30 tablet 2   predniSONE (DELTASONE) 5 MG tablet Take 1 tablet (5 mg total) by mouth daily with breakfast. Take 2 tabs daily for 7 days then 1 tab daily 40 tablet 0   prochlorperazine (COMPAZINE) 10 MG tablet TAKE 1 TABLET BY MOUTH EVERY 6 HOURS AS NEEDED FOR NAUSEA FOR VOMITING 30 tablet 1   solifenacin (VESICARE) 10 MG tablet Take 10 mg by mouth every evening.     sucralfate (CARAFATE) 1 g tablet Take 1 tablet (1 g total) by mouth 4 (four) times daily -  with meals and at bedtime. 5 min before meals for radiation induced esophagitis 120 tablet 2   zolpidem (AMBIEN) 10 MG tablet Take 1 tablet (10 mg total) by mouth at bedtime as needed for sleep. 20 tablet 0   No current facility-administered medications for this visit.    PHYSICAL EXAMINATION: ECOG PERFORMANCE STATUS: 2 - Symptomatic, <50% confined to bed  Vitals:   01/14/22 1437  BP: (!) 139/55  Pulse: 79  Resp: 20  Temp: 98.2 F (36.8 C)  SpO2: 97%   Wt Readings from Last 3 Encounters:  01/14/22 240 lb 9.6 oz (109.1 kg)  12/10/21 244 lb 12.8 oz (111 kg)  12/04/21 242 lb (109.8 kg)     GENERAL:alert, no distress and comfortable SKIN: skin color, texture, turgor are normal, no rashes or significant lesions EYES: normal, Conjunctiva are pink and non-injected, sclera clear NECK: supple, thyroid normal size, non-tender, without nodularity LYMPH:  no palpable lymphadenopathy in the cervical, axillary  LUNGS: clear to auscultation and percussion with normal breathing effort HEART: regular rate & rhythm and no murmurs and no lower extremity edema ABDOMEN:abdomen soft, non-tender and normal bowel sounds Musculoskeletal:no cyanosis of digits and no clubbing  NEURO: alert & oriented x 3 with fluent speech, no focal motor/sensory  deficits  LABORATORY DATA:  I have reviewed the data as listed    Latest Ref Rng & Units 01/14/2022    1:57 PM 12/10/2021    1:59 PM 11/13/2021    1:27 PM  CBC  WBC 4.0 - 10.5  K/uL 2.5  3.2  2.6   Hemoglobin 12.0 - 15.0 g/dL 10.0  11.9  12.4   Hematocrit 36.0 - 46.0 % 30.0  36.1  37.2   Platelets 150 - 400 K/uL 180  172  153         Latest Ref Rng & Units 01/14/2022    1:57 PM 12/10/2021    1:59 PM 11/13/2021    1:27 PM  CMP  Glucose 70 - 99 mg/dL 107  117  114   BUN 6 - 20 mg/dL '12  12  18   ' Creatinine 0.44 - 1.00 mg/dL 0.99  1.19  1.57   Sodium 135 - 145 mmol/L 138  140  142   Potassium 3.5 - 5.1 mmol/L 3.7  3.7  4.3   Chloride 98 - 111 mmol/L 103  107  108   CO2 22 - 32 mmol/L '28  27  23   ' Calcium 8.9 - 10.3 mg/dL 9.1  9.3  9.7   Total Protein 6.5 - 8.1 g/dL 6.9  7.0  7.9   Total Bilirubin 0.3 - 1.2 mg/dL 0.5  0.5  0.6   Alkaline Phos 38 - 126 U/L 74  60  70   AST 15 - 41 U/L '19  23  29   ' ALT 0 - 44 U/L '13  19  28       ' RADIOGRAPHIC STUDIES: I have personally reviewed the radiological images as listed and agreed with the findings in the report. No results found.    Orders Placed This Encounter  Procedures   CT CHEST ABDOMEN PELVIS W CONTRAST    Standing Status:   Future    Standing Expiration Date:   01/15/2023    Order Specific Question:   Is patient pregnant?    Answer:   No    Order Specific Question:   Preferred imaging location?    Answer:   [facility] [place]    Order Specific Question:   Release to patient    Answer:   Immediate    Order Specific Question:   Is Oral Contrast requested for this exam?    Answer:   Yes, Per Radiology protocol   All questions were answered. The patient knows to call the clinic with any problems, questions or concerns. No barriers to learning was detected. The total time spent in the appointment was 30 minutes.     Truitt Merle, MD 01/14/2022   I, Wilburn Mylar, am acting as scribe for Truitt Merle, MD.   I have reviewed  the above documentation for accuracy and completeness, and I agree with the above.

## 2022-01-16 ENCOUNTER — Other Ambulatory Visit (HOSPITAL_COMMUNITY): Payer: Self-pay

## 2022-01-17 ENCOUNTER — Ambulatory Visit
Admission: RE | Admit: 2022-01-17 | Discharge: 2022-01-17 | Disposition: A | Discharge status: Home / Self Care | Payer: BC Managed Care – PPO | Source: Ambulatory Visit | Attending: Radiation Oncology | Admitting: Radiation Oncology

## 2022-01-17 ENCOUNTER — Telehealth: Payer: Self-pay | Admitting: Hematology

## 2022-01-17 ENCOUNTER — Encounter: Payer: Self-pay | Admitting: Urology

## 2022-01-17 DIAGNOSIS — C7931 Secondary malignant neoplasm of brain: Secondary | ICD-10-CM | POA: Insufficient documentation

## 2022-01-17 NOTE — Telephone Encounter (Signed)
Left message with follow-up appointments per 7/25 los. 

## 2022-01-17 NOTE — Progress Notes (Signed)
Telephone appointment. I spoke w/ patient's POA Ms. Marylou Flesher, verified her identity and began nursing interview. She reports that patient is having some difficulty swallowing, moderate SOB, sinus congestion, cough, and wheezing. 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 No other issues reported at this time.  Meaningful use complete. Hysterectomy- NO chances of pregnancy.  Reminded patient's POA of her 10:00am-01/17/22 telephone appointment w/ Ashlyn Bruning PA-C. I left my extension [phone] in case patient needs anything. Ms. Hopper verbalized understanding.  Patient contact [phone]

## 2022-01-17 NOTE — Progress Notes (Signed)
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  Interval Since Last Radiation:  1 month s/p SRS brain; 2 months s/p IMRT lung  12/10/21: Single fraction SRS brain PTV9   09/19/21 - 11/04/21: The primary tumor in the LUL lung and involved mediastinal adenopathy were treated to 66 Gy in 33 fractions of 2 Gy.  08/27/21: Single fraction SRS Brain PTV7-8   05/08/21 - 05/13/21: Fractionated SRS Brain PTV1 - PTV6     Narrative:  I spoke with the patient to conduct her routine scheduled 1 month follow up visit via telephone to spare the patient unnecessary potential exposure in the healthcare setting during the current COVID-19 pandemic.  The patient was notified in advance and gave permission to proceed with this visit format.  She tolerated the recent [facility] treatment relatively well without any ill side effects.   On review of systems, the patient states that she is doing well in general.  She reports that the esophagitis and skin irritation that she experienced with her chest radiation has now fully resolved.  She denies recent headaches, dizziness/imbalance, changes in visual or auditory acuity, difficulty with speech, word finding, tremors or seizure activity.  She did have a migraine headache last week while she was on her mission trip with her church but this resolved with her typical migraine medications and has not reoccurred.  She continues with mild to moderate fatigue but is overall, pleased with her progress to date.  She continues to tolerate the Tagrisso fairly well.    ALLERGIES:  is allergic to celebrex [celecoxib] and minocycline.  Meds: Current Outpatient  Medications  Medication Sig Dispense Refill   acetaminophen (TYLENOL) 500 MG tablet Take 500 mg by mouth every 6 (six) hours as needed for moderate pain or headache.     albuterol (VENTOLIN HFA) 108 (90 Base) MCG/ACT inhaler Inhale 2 puffs into the lungs every 4 (four) hours as needed.     ALPRAZolam (XANAX) 0.25 MG tablet Take 0.25 mg by mouth every 4 (four) hours as needed for anxiety. for anxiety     aspirin EC 81 MG tablet Take 81 mg by mouth daily. Swallow whole.     citalopram (CELEXA) 10 MG tablet Take 10 mg by mouth at bedtime.     clindamycin (CLEOCIN T) 1 % external solution Apply topically 2 (two) times daily. 30 mL 0   clindamycin-benzoyl peroxide (BENZACLIN) gel Apply to face 2 times a day 25 g 1   diclofenac sodium (VOLTAREN) 1 % GEL Apply topically as needed. To affected area     diphenhydrAMINE (BENADRYL) 25 MG tablet Take 25 mg by mouth See admin instructions. 25 mg at bedtime 25 mg as needed for itching     Docusate Sodium (STOOL SOFTENER LAXATIVE PO) Take 1 tablet by mouth as needed.     eletriptan (RELPAX) 20 MG tablet Take 20 mg by mouth as needed for headache (at onset of migraine.). May repeat in 2 hours if needed.     fluconazole (DIFLUCAN) 200 MG tablet Take 1 tablet (200 mg total) by mouth daily. 14 tablet 0   fluticasone (FLONASE) 50 MCG/ACT nasal spray Place 2 sprays  into both nostrils daily as needed for allergies.     HYDROcodone-acetaminophen (HYCET) 7.5-325 mg/15 ml solution Take 10 mLs by mouth every 4 (four) hours as needed for moderate pain. 473 mL 0   hydrOXYzine (ATARAX/VISTARIL) 25 MG tablet Take 25 mg by mouth See admin instructions. 25 mg at bedtime 25 mg during the day as needed for itching/anxiety     loratadine (CLARITIN) 10 MG tablet Take 10 mg by mouth daily.     magic mouthwash (nystatin, lidocaine, diphenhydrAMINE, alum & mag hydroxide) suspension Swish and swallow 5 mls by mouth every 6 hours as needed 250 mL 1   magic mouthwash w/lidocaine SOLN  Take 5 mLs by mouth every 6 hours as needed for mouth pain. Swish and Swallow (Patient not taking: Reported on 08/19/2021) 250 mL 1   montelukast (SINGULAIR) 10 MG tablet Take 10 mg by mouth at bedtime.     nystatin (MYCOSTATIN/NYSTOP) powder Apply 1 application topically 3 (three) times daily. 60 g 1   ondansetron (ZOFRAN-ODT) 4 MG disintegrating tablet DISSOLVE 1 TABLET IN MOUTH EVERY 8 HOURS AS NEEDED FOR NAUSEA AND VOMITING 18 tablet 1   osimertinib mesylate (TAGRISSO) 80 MG tablet Take 1 tablet (80 mg total) by mouth daily. 30 tablet 2   predniSONE (DELTASONE) 5 MG tablet Take 1 tablet (5 mg total) by mouth daily with breakfast. Take 2 tabs daily for 7 days then 1 tab daily 40 tablet 0   prochlorperazine (COMPAZINE) 10 MG tablet TAKE 1 TABLET BY MOUTH EVERY 6 HOURS AS NEEDED FOR NAUSEA FOR VOMITING 30 tablet 1   solifenacin (VESICARE) 10 MG tablet Take 10 mg by mouth every evening.     sucralfate (CARAFATE) 1 g tablet Take 1 tablet (1 g total) by mouth 4 (four) times daily -  with meals and at bedtime. 5 min before meals for radiation induced esophagitis 120 tablet 2   zolpidem (AMBIEN) 10 MG tablet Take 1 tablet (10 mg total) by mouth at bedtime as needed for sleep. 20 tablet 0   No current facility-administered medications for this encounter.    Physical Findings:  vitals were not taken for this visit.  Pain Assessment Pain Score: 0-No pain/10 Unable to assess due to telephone follow-up visit format.  Lab Findings: Lab Results  Component Value Date   WBC 2.5 (L) 01/14/2022   HGB 10.0 (L) 01/14/2022   HCT 30.0 (L) 01/14/2022   MCV 85.0 01/14/2022   PLT 180 01/14/2022     Radiographic Findings: No results found.  Impression/Plan: 53. 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 She appears to have recovered well from the effects of her recent [place] brain treatment and is without complaints. We  discussed the plan to obtain a psot-treatment MRI brain scan in September 2023 to assess her treatment response and pending this scan is stable, we will proceed with serial MRI brain scans every 3 months going forward, to continue to monitor for any evidence of disease recurrence or progression. I will call her to review the results and any recommendations from the multidisciplinary brain conference following each scan. She will also continue in routine follow up with Dr. Burr Medico for continued management of her systemic disease. She continues to tolerate the Tagrisso fairly well. She is planning to continue to [place] and her next scheduled follow-up visit with Dr. Burr Medico will be in 1 month with plans for disease restaging imaging prior  to that visit.  She appears to have a good understanding of our recommendations and is comfortable and in agreement with the stated plan. I look forward to connecting with her by telephone in September, to review MRI results but she knows that she is welcome to call with any questions or concerns at any time in the interim.     Nicholos Johns, PA-C

## 2022-01-22 ENCOUNTER — Telehealth: Payer: Self-pay | Admitting: Hematology

## 2022-01-22 NOTE — Telephone Encounter (Signed)
Patient called to reschedule upcoming appointments. Patient is aware of changes.

## 2022-02-06 ENCOUNTER — Other Ambulatory Visit (HOSPITAL_COMMUNITY): Payer: Self-pay

## 2022-02-10 ENCOUNTER — Inpatient Hospital Stay: Payer: BC Managed Care – PPO

## 2022-02-10 ENCOUNTER — Other Ambulatory Visit (HOSPITAL_COMMUNITY): Payer: Self-pay

## 2022-02-10 ENCOUNTER — Encounter: Payer: Self-pay | Admitting: Hematology

## 2022-02-10 ENCOUNTER — Ambulatory Visit (HOSPITAL_COMMUNITY)
Admission: RE | Admit: 2022-02-10 | Discharge: 2022-02-10 | Disposition: A | Discharge status: Home / Self Care | Payer: BC Managed Care – PPO | Source: Ambulatory Visit | Attending: Hematology | Admitting: Hematology

## 2022-02-10 ENCOUNTER — Inpatient Hospital Stay: Payer: BC Managed Care – PPO | Attending: Hematology

## 2022-02-10 DIAGNOSIS — C3412 Malignant neoplasm of upper lobe, left bronchus or lung: Secondary | ICD-10-CM

## 2022-02-10 LAB — CBC WITH DIFFERENTIAL (CANCER CENTER ONLY)
Abs Immature Granulocytes: 0 10*3/uL (ref 0.00–0.07)
Basophils Absolute: 0 10*3/uL (ref 0.0–0.1)
Basophils Relative: 0 %
Eosinophils Absolute: 0 10*3/uL (ref 0.0–0.5)
Eosinophils Relative: 0 %
HCT: 33.1 % — ABNORMAL LOW (ref 36.0–46.0)
Hemoglobin: 11.1 g/dL — ABNORMAL LOW (ref 12.0–15.0)
Immature Granulocytes: 0 %
Lymphocytes Relative: 32 %
Lymphs Abs: 0.8 10*3/uL (ref 0.7–4.0)
MCH: 28.1 pg (ref 26.0–34.0)
MCHC: 33.5 g/dL (ref 30.0–36.0)
MCV: 83.8 fL (ref 80.0–100.0)
Monocytes Absolute: 0.3 10*3/uL (ref 0.1–1.0)
Monocytes Relative: 12 %
Neutro Abs: 1.3 10*3/uL — ABNORMAL LOW (ref 1.7–7.7)
Neutrophils Relative %: 56 %
Platelet Count: 153 10*3/uL (ref 150–400)
RBC: 3.95 MIL/uL (ref 3.87–5.11)
RDW: 13.3 % (ref 11.5–15.5)
WBC Count: 2.4 10*3/uL — ABNORMAL LOW (ref 4.0–10.5)
nRBC: 0 % (ref 0.0–0.2)

## 2022-02-10 LAB — CMP (CANCER CENTER ONLY)
ALT: 15 U/L (ref 0–44)
AST: 19 U/L (ref 15–41)
Albumin: 4.3 g/dL (ref 3.5–5.0)
Alkaline Phosphatase: 66 U/L (ref 38–126)
Anion gap: 6 (ref 5–15)
BUN: 16 mg/dL (ref 6–20)
CO2: 25 mmol/L (ref 22–32)
Calcium: 9.5 mg/dL (ref 8.9–10.3)
Chloride: 105 mmol/L (ref 98–111)
Creatinine: 1.25 mg/dL — ABNORMAL HIGH (ref 0.44–1.00)
GFR, Estimated: 51 mL/min — ABNORMAL LOW (ref >60)
Glucose, Bld: 97 mg/dL (ref 70–99)
Potassium: 4 mmol/L (ref 3.5–5.1)
Sodium: 136 mmol/L (ref 135–145)
Total Bilirubin: 0.5 mg/dL (ref 0.3–1.2)
Total Protein: 7 g/dL (ref 6.5–8.1)

## 2022-02-10 MED ORDER — IOHEXOL 300 MG/ML  SOLN
100.0000 mL | Freq: Once | INTRAMUSCULAR | Status: AC | PRN
  Administered 2022-02-10: 100 mL via INTRAVENOUS

## 2022-02-10 MED ORDER — IOHEXOL 9 MG/ML PO SOLN
1000.0000 mL | Freq: Once | ORAL | Status: AC
  Administered 2022-02-10: 1000 mL via ORAL

## 2022-02-10 NOTE — Progress Notes (Signed)
  Radiation Oncology         [phone] ________________________________  Name: SEMIYAH NEWGENT MRN: [id number]  Date: 08/27/2021  DOB: [birth year]  End of Treatment Note  Diagnosis:   56 yo woman with two brain metastases from left upper lung cancer   Indication for treatment:  08/27/21       Radiation treatment dates:   Palliation  Site/dose/beams/energy:   PTV7-Left Frontal 4 mm and PTV8-Left Frontal 3 mm targets were treated using a single isocenter using 7 Dynamic Conformal Arcs to a prescription dose of 20 Gy.  ExacTrac registration was performed for each couch angle.  The 100% isodose line was prescribed.  6 MV X-rays were delivered in the flattening filter free beam mode.  Narrative: The patient tolerated radiation treatment relatively well.     Plan: The patient has completed radiation treatment. The patient will return to radiation oncology clinic for routine followup in one month. I advised her to call or return sooner if she has any questions or concerns related to her recovery or treatment. ________________________________  Sheral Apley. Tammi Klippel, M.D.

## 2022-02-13 ENCOUNTER — Encounter: Payer: Self-pay | Admitting: Hematology

## 2022-02-13 ENCOUNTER — Inpatient Hospital Stay (HOSPITAL_BASED_OUTPATIENT_CLINIC_OR_DEPARTMENT_OTHER): Payer: BC Managed Care – PPO | Admitting: Hematology

## 2022-02-13 DIAGNOSIS — C7931 Secondary malignant neoplasm of brain: Secondary | ICD-10-CM

## 2022-02-13 DIAGNOSIS — C3412 Malignant neoplasm of upper lobe, left bronchus or lung: Secondary | ICD-10-CM | POA: Diagnosis not present

## 2022-02-13 NOTE — Progress Notes (Signed)
[facility]   [phone] [phone]   Clinic Follow up Note   Patient Care Team: Aletha Halim., PA-C as PCP - General (Family Medicine) Truitt Merle, MD as Consulting Physician (Hematology and Oncology) Tyler Pita, MD as Consulting Physician (Radiation Oncology)  Date of Service:  02/13/2022  I connected with Abigail Golden on 02/13/2022 at 10:20 AM EDT by video enabled telemedicine visit and verified that I am speaking with the correct person using two identifiers.  I discussed the limitations, risks, security and privacy concerns of performing an evaluation and management service by telephone and the availability of in person appointments. I also discussed with the patient that there may be a patient responsible charge related to this service. The patient expressed understanding and agreed to proceed.   Other persons participating in the visit and their role in the encounter:  [name]  Patient's location:  home Provider's location:  my office  CHIEF COMPLAINT: f/u of metastatic lung cancer  CURRENT THERAPY:  Tagrisso, 80 mg daily, started 05/22/21  ASSESSMENT & PLAN:  Abigail Golden is a 56 y.o. female with   1. 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 Brain MRI showed multifocal metastatic disease. [See #3] -staging CT CAP 04/23/21 showed: 4.5 cm LUL mass that abuts pleural surface; no lymphadenopathy or metastatic disease. -bronchoscopy on 04/24/21 under Dr. Valeta Harms, path showed adenocarcinoma, but insufficient tissue for molecular studies. Guardant 360 showed EGFR L858R mutation, which predicts good response to EGFR inhibitor. -PET scan 05/13/21 showed LUL mass extending along superior mediastinal border from hilum towards apex.  No lymph node or other distant  metastasis. -she started Tagrisso on 05/22/21. She tolerates moderately well with mucositis and skin toxicities.  -PET scan on 08/28/21 showed: similar size and metabolic activity to primary left lung mass; new hypermetabolic ipsilateral nodal metastasis to prevascular nodal space, left lower paratracheal nodal station, and left supraclavicular.  -she completed concurrent radiation therapy to the LUL under Dr. Tammi Klippel 09/19/21 - 11/04/21. She has recovered well -restaging CT CAP 02/10/22 showed radiation changes without discrete lung mass and decreased size of lymph nodes. I reviewed the images myself and discussed the results with them today. Plan to repeat in 4 months. Will continue tagrisso for now. -she continues to tolerate tagrisso well. They note she changed when she takes it, which has also helped. Labs from 8/21 reviewed, overall stable to improved.  -f/u in 2 months   2. Brain Metastases -initially seen on brain MRI 04/23/21 -s/p SRS under Dr. Tammi Klippel 05/08/21 - 05/13/21 (3 fractions), 08/27/21, and 12/10/21.     Plan:  -continue Tagrisso 59m daily, she is clinically doing well overall  -lab and f/u in 2 months   No problem-specific Assessment & Plan notes found for this encounter.   SUMMARY OF ONCOLOGIC HISTORY: Oncology History Overview Note   Cancer Staging  Primary adenocarcinoma of upper lobe of left lung ([place] Staging form: Lung, AJCC 8th Edition - Clinical stage from 04/24/2021: Stage IVA (cT2, cN2, pM1b) - Signed by FTruitt Merle MD on 05/14/2021    Primary adenocarcinoma of upper lobe of left lung ([place]  04/22/2021 Imaging   CT HEAD WO CONTRAST   IMPRESSION: Two separate areas of vasogenic edema within the left hemisphere, 1 within the inferior frontal region in the other at the left parietal vertex. Metastatic disease would be  the most likely cause of this appearance. Other possibilities include venous infarctions and cerebritis. MRI with contrast recommended.     04/23/2021 Imaging   MR Brain W and Wo Contrast  IMPRESSION: 1. Metastatic disease to the brain. Two enhancing brain masses with moderate associated vasogenic edema: solid 16 mm left parietal lobe mass, and a larger 29 mm rim enhancing cystic or necrotic mass in the left inferior frontal gyrus. Two other small 2-3 mm metastases in the right inferior frontal gyrus, right posterior temporal lobe. And questionable two additional punctate metastases in both superior frontal gyri abutting the falx (series 18, image 46). 2. No significant intracranial mass effect. No other intracranial abnormality.   04/23/2021 Imaging   CT Chest W Contrast  IMPRESSION: 4.5 cm spiculated left upper lobe mass most compatible with primary lung cancer. This abuts the medial pleural surface. A few small adjacent pre-vascular mediastinal lymph nodes, none pathologically enlarged. Recommend cardiothoracic surgical and oncologic consultation.   No acute findings or evidence of metastatic disease in the abdomen or pelvis.   04/24/2021 Cancer Staging   Staging form: Lung, AJCC 8th Edition - Clinical stage from 04/24/2021: Stage IVA (cT2, cN2, pM1b) - Signed by Truitt Merle, MD on 05/14/2021   04/24/2021 Initial Biopsy   FINAL MICROSCOPIC DIAGNOSIS:   A. LUNG, LUL, FINE NEEDLE ASPIRATION:  - Malignant cells present, consistent with adenocarcinoma   B. LUNG, LUL, BRUSHING:  - Malignant cells consistent with adenocarcinoma, see comment    COMMENT:  B.  Immunohistochemical stains show that the tumor cells are positive for TTF-1 while they are negative for p63 and CK5/6, consistent with above interpretation. The number of tumor cells appears to be insufficient for molecular studies.   05/03/2021 Initial Diagnosis   Lung cancer ([place])   05/08/2021 - 05/13/2021 Radiation Therapy   SRS treatment to the metastatic brain lesions under the care of Dr. Tammi Klippel    08/05/2021 Imaging   EXAM: CT CHEST, ABDOMEN, AND PELVIS WITH  CONTRAST  IMPRESSION: 1. No substantial change in size of the left upper lobe pulmonary lesion. 2. Borderline to mildly enlarged adjacent prevascular/AP window lymph nodes have increased in size in the interval. This finding raises concern for disease progression. 3. Interval development of upper normal bilateral common iliac and left external iliac lymph nodes. Close attention on follow-up recommended. 4. No other evidence for metastatic disease in the abdomen or pelvis.      INTERVAL HISTORY:  Abigail Golden was contacted for a follow up of metastatic lung cancer. She was last seen by me on 01/14/22. She reports her esophagus is improving since completing radiation.    All other systems were reviewed with the patient and are negative.  MEDICAL HISTORY:  Past Medical History:  Diagnosis Date   Allergy    Anxiety    Chronic bronchitis ([employer])    Depression    Headache    Hypoglycemia    Lower back pain    since fall ~ 2012 (05/25/2015)   Migraine    05/25/2015 "probably once/month"   Migraine headache    Pain in left hip    since fall ~ 2012 (05/25/2015)   Pneumonia "several times"    SURGICAL HISTORY: Past Surgical History:  Procedure Laterality Date   ABDOMINAL HYSTERECTOMY  11/2002   "found benign tumors"   BRONCHIAL BIOPSY  04/24/2021   Procedure: BRONCHIAL BIOPSIES;  Surgeon: Garner Nash, DO;  Location: [facility];  Service: Pulmonary;;   BRONCHIAL BRUSHINGS  04/24/2021  Procedure: BRONCHIAL BRUSHINGS;  Surgeon: Garner Nash, DO;  Location: [facility];  Service: Pulmonary;;   BRONCHIAL NEEDLE ASPIRATION BIOPSY  04/24/2021   Procedure: BRONCHIAL NEEDLE ASPIRATION BIOPSIES;  Surgeon: Garner Nash, DO;  Location: [facility];  Service: Pulmonary;;   CHEST TUBE INSERTION  04/24/2021   Procedure: CHEST TUBE INSERTION;  Surgeon: Garner Nash, DO;  Location: [facility];  Service: Pulmonary;;   CHOLECYSTECTOMY N/A 05/25/2015   Procedure: LAPAROSCOPIC  CHOLECYSTECTOMY;  Surgeon: Ralene Ok, MD;  Location: [facility];  Service: General;  Laterality: N/A;   LAPAROSCOPIC CHOLECYSTECTOMY  05/25/2015   MENISCUS REPAIR Right 03/2017   VIDEO BRONCHOSCOPY WITH ENDOBRONCHIAL NAVIGATION N/A 04/24/2021   Procedure: VIDEO BRONCHOSCOPY WITH ENDOBRONCHIAL NAVIGATION;  Surgeon: Garner Nash, DO;  Location: [facility];  Service: Pulmonary;  Laterality: N/A;   VIDEO BRONCHOSCOPY WITH RADIAL ENDOBRONCHIAL ULTRASOUND  04/24/2021   Procedure: VIDEO BRONCHOSCOPY WITH RADIAL ENDOBRONCHIAL ULTRASOUND;  Surgeon: Garner Nash, DO;  Location: [facility];  Service: Pulmonary;;    I have reviewed the social history and family history with the patient and they are unchanged from previous note.  ALLERGIES:  is allergic to celebrex [celecoxib] and minocycline.  MEDICATIONS:  Current Outpatient Medications  Medication Sig Dispense Refill   acetaminophen (TYLENOL) 500 MG tablet Take 500 mg by mouth every 6 (six) hours as needed for moderate pain or headache.     albuterol (VENTOLIN HFA) 108 (90 Base) MCG/ACT inhaler Inhale 2 puffs into the lungs every 4 (four) hours as needed.     ALPRAZolam (XANAX) 0.25 MG tablet Take 0.25 mg by mouth every 4 (four) hours as needed for anxiety. for anxiety     aspirin EC 81 MG tablet Take 81 mg by mouth daily. Swallow whole.     citalopram (CELEXA) 10 MG tablet Take 10 mg by mouth at bedtime.     clindamycin (CLEOCIN T) 1 % external solution Apply topically 2 (two) times daily. 30 mL 0   clindamycin-benzoyl peroxide (BENZACLIN) gel Apply to face 2 times a day 25 g 1   diclofenac sodium (VOLTAREN) 1 % GEL Apply topically as needed. To affected area     diphenhydrAMINE (BENADRYL) 25 MG tablet Take 25 mg by mouth See admin instructions. 25 mg at bedtime 25 mg as needed for itching     Docusate Sodium (STOOL SOFTENER LAXATIVE PO) Take 1 tablet by mouth as needed.     eletriptan (RELPAX) 20 MG tablet Take 20 mg by mouth as needed  for headache (at onset of migraine.). May repeat in 2 hours if needed.     fluconazole (DIFLUCAN) 200 MG tablet Take 1 tablet (200 mg total) by mouth daily. 14 tablet 0   fluticasone (FLONASE) 50 MCG/ACT nasal spray Place 2 sprays into both nostrils daily as needed for allergies.     HYDROcodone-acetaminophen (HYCET) 7.5-325 mg/15 ml solution Take 10 mLs by mouth every 4 (four) hours as needed for moderate pain. 473 mL 0   hydrOXYzine (ATARAX/VISTARIL) 25 MG tablet Take 25 mg by mouth See admin instructions. 25 mg at bedtime 25 mg during the day as needed for itching/anxiety     loratadine (CLARITIN) 10 MG tablet Take 10 mg by mouth daily.     magic mouthwash (nystatin, lidocaine, diphenhydrAMINE, alum & mag hydroxide) suspension Swish and swallow 5 mls by mouth every 6 hours as needed 250 mL 1   magic mouthwash w/lidocaine SOLN Take 5 mLs by mouth every 6 hours as needed  for mouth pain. Swish and Swallow (Patient not taking: Reported on 08/19/2021) 250 mL 1   montelukast (SINGULAIR) 10 MG tablet Take 10 mg by mouth at bedtime.     nystatin (MYCOSTATIN/NYSTOP) powder Apply 1 application topically 3 (three) times daily. 60 g 1   ondansetron (ZOFRAN-ODT) 4 MG disintegrating tablet DISSOLVE 1 TABLET IN MOUTH EVERY 8 HOURS AS NEEDED FOR NAUSEA AND VOMITING 18 tablet 1   osimertinib mesylate (TAGRISSO) 80 MG tablet Take 1 tablet (80 mg total) by mouth daily. 30 tablet 2   predniSONE (DELTASONE) 5 MG tablet Take 1 tablet (5 mg total) by mouth daily with breakfast. Take 2 tabs daily for 7 days then 1 tab daily 40 tablet 0   prochlorperazine (COMPAZINE) 10 MG tablet TAKE 1 TABLET BY MOUTH EVERY 6 HOURS AS NEEDED FOR NAUSEA FOR VOMITING 30 tablet 1   solifenacin (VESICARE) 10 MG tablet Take 10 mg by mouth every evening.     sucralfate (CARAFATE) 1 g tablet Take 1 tablet (1 g total) by mouth 4 (four) times daily -  with meals and at bedtime. 5 min before meals for radiation induced esophagitis 120 tablet 2    zolpidem (AMBIEN) 10 MG tablet Take 1 tablet (10 mg total) by mouth at bedtime as needed for sleep. 20 tablet 0   No current facility-administered medications for this visit.    PHYSICAL EXAMINATION: ECOG PERFORMANCE STATUS: 2 - Symptomatic, <50% confined to bed  There were no vitals filed for this visit. Wt Readings from Last 3 Encounters:  01/14/22 240 lb 9.6 oz (109.1 kg)  12/10/21 244 lb 12.8 oz (111 kg)  12/04/21 242 lb (109.8 kg)     No vitals taken today, Exam not performed today  LABORATORY DATA:  I have reviewed the data as listed    Latest Ref Rng & Units 02/10/2022    2:55 PM 01/14/2022    1:57 PM 12/10/2021    1:59 PM  CBC  WBC 4.0 - 10.5 K/uL 2.4  2.5  3.2   Hemoglobin 12.0 - 15.0 g/dL 11.1  10.0  11.9   Hematocrit 36.0 - 46.0 % 33.1  30.0  36.1   Platelets 150 - 400 K/uL 153  180  172         Latest Ref Rng & Units 02/10/2022    2:55 PM 01/14/2022    1:57 PM 12/10/2021    1:59 PM  CMP  Glucose 70 - 99 mg/dL 97  107  117   BUN 6 - 20 mg/dL '16  12  12   ' Creatinine 0.44 - 1.00 mg/dL 1.25  0.99  1.19   Sodium 135 - 145 mmol/L 136  138  140   Potassium 3.5 - 5.1 mmol/L 4.0  3.7  3.7   Chloride 98 - 111 mmol/L 105  103  107   CO2 22 - 32 mmol/L '25  28  27   ' Calcium 8.9 - 10.3 mg/dL 9.5  9.1  9.3   Total Protein 6.5 - 8.1 g/dL 7.0  6.9  7.0   Total Bilirubin 0.3 - 1.2 mg/dL 0.5  0.5  0.5   Alkaline Phos 38 - 126 U/L 66  74  60   AST 15 - 41 U/L '19  19  23   ' ALT 0 - 44 U/L '15  13  19       ' RADIOGRAPHIC STUDIES: I have personally reviewed the radiological images as listed and agreed with the findings in the  report. No results found.    No orders of the defined types were placed in this encounter.  All questions were answered. The patient knows to call the clinic with any problems, questions or concerns. No barriers to learning was detected. The total time spent in the appointment was 30 minutes.     Truitt Merle, MD 02/13/2022   I, Wilburn Mylar, am  acting as scribe for Truitt Merle, MD.   I have reviewed the above documentation for accuracy and completeness, and I agree with the above.

## 2022-02-14 ENCOUNTER — Telehealth: Payer: Self-pay | Admitting: Hematology

## 2022-02-14 ENCOUNTER — Other Ambulatory Visit: Payer: Self-pay | Admitting: Radiation Therapy

## 2022-02-14 DIAGNOSIS — C7931 Secondary malignant neoplasm of brain: Secondary | ICD-10-CM

## 2022-02-14 NOTE — Telephone Encounter (Signed)
Scheduled follow-up appointment per 8/24 los. Patient is aware.

## 2022-03-03 ENCOUNTER — Telehealth: Payer: Self-pay | Admitting: Radiation Therapy

## 2022-03-03 NOTE — Telephone Encounter (Signed)
I received a call from [name] today complaining of an on and off again dull headache with light sensitivity since Wed 9/6. She has gotten relief with tylenol and migraine medication, but the headache has not completely resolved.   [name] was already scheduled for a follow-up brain MRI on 9/22, but will call to see if that can be moved up to an earlier date.   Mont Dutton R.T.(R)(T) Radiation Special Procedures Navigator

## 2022-03-04 ENCOUNTER — Other Ambulatory Visit (HOSPITAL_COMMUNITY): Payer: Self-pay

## 2022-03-12 ENCOUNTER — Telehealth: Payer: Self-pay

## 2022-03-12 NOTE — Telephone Encounter (Signed)
Notified Patient of completion of Form 7A. Copy of Form placed for pick-up as requested by Patient. No other needs or concerns voiced at this time.

## 2022-03-13 ENCOUNTER — Other Ambulatory Visit (HOSPITAL_COMMUNITY): Payer: Self-pay

## 2022-03-14 ENCOUNTER — Ambulatory Visit (HOSPITAL_COMMUNITY)
Admission: RE | Admit: 2022-03-14 | Discharge: 2022-03-14 | Disposition: A | Discharge status: Home / Self Care | Payer: BC Managed Care – PPO | Source: Ambulatory Visit | Attending: Radiation Oncology | Admitting: Radiation Oncology

## 2022-03-14 DIAGNOSIS — C7931 Secondary malignant neoplasm of brain: Secondary | ICD-10-CM | POA: Diagnosis present

## 2022-03-14 MED ORDER — GADOPICLENOL 0.5 MMOL/ML IV SOLN
10.0000 mL | Freq: Once | INTRAVENOUS | Status: AC | PRN
  Administered 2022-03-14: 10 mL via INTRAVENOUS

## 2022-03-17 ENCOUNTER — Inpatient Hospital Stay: Payer: BC Managed Care – PPO | Attending: Hematology

## 2022-03-18 ENCOUNTER — Other Ambulatory Visit: Payer: Self-pay | Admitting: Hematology

## 2022-03-18 MED ORDER — ZOLPIDEM TARTRATE 10 MG PO TABS: 10.0000 mg | ORAL_TABLET | Freq: Every evening | ORAL | 0 refills | Status: DC | PRN

## 2022-03-19 ENCOUNTER — Encounter: Payer: Self-pay | Admitting: Urology

## 2022-03-19 NOTE — Progress Notes (Signed)
Telephone appointment. I verified patient's identity and began nursing interview. Patient reports occasional headaches 8/10, dizziness, nausea, mild SOB, & fatigue. No other issues reported at this time.  Meaningful use complete. Hysterectomy- NO chances of pregnancy.  Patient aware of her 1:30pm-03/20/22 telephone appointment w/ Ashlyn Bruning PA-C. I left my extension [phone] in case patient needs anything. Patient verbalized understanding.  Patient contact [phone]

## 2022-03-20 ENCOUNTER — Ambulatory Visit
Admission: RE | Admit: 2022-03-20 | Discharge: 2022-03-20 | Disposition: A | Discharge status: Home / Self Care | Payer: BC Managed Care – PPO | Source: Ambulatory Visit | Attending: Radiation Oncology | Admitting: Radiation Oncology

## 2022-03-20 DIAGNOSIS — C7931 Secondary malignant neoplasm of brain: Secondary | ICD-10-CM | POA: Insufficient documentation

## 2022-03-20 NOTE — Progress Notes (Signed)
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  Interval Since Last Radiation:  3 month s/p SRS brain; 4 months s/p IMRT lung  12/10/21: Single fraction SRS brain PTV9   09/19/21 - 11/04/21: The primary tumor in the LUL lung and involved mediastinal adenopathy were treated to 66 Gy in 33 fractions of 2 Gy.  08/27/21: Single fraction SRS Brain PTV7-8   05/08/21 - 05/13/21: Fractionated SRS Brain PTV1 - PTV6     Narrative:  I spoke with the patient to conduct her routine scheduled 3 month follow up visit via telephone to spare the patient unnecessary potential exposure in the healthcare setting during the current COVID-19 pandemic.  The patient was notified in advance and gave permission to proceed with this visit format.  She tolerated the recent [facility] treatment relatively well without any ill side effects. She had a recent post-treatment MRI brain scan on 03/14/22 that shows an excellent response to treatment with the previously identified enhancing lesions in the left middle frontal gyrus no longer visible and previously treated lesions in the inferior left frontal lobe and high posterior left parietal lobe are decreased in size.  There are no new lesions present to suggest progressive disease.  We reviewed these results today.  On review of systems, the patient states that she is doing well in general.  She reports that the esophagitis and skin irritation that she experienced with her chest radiation are fully resolved.  She denies any changes in visual or auditory acuity, difficulty with speech, word finding, tremors or seizure activity. She has been having recent headaches,  with associated nausea, dizziness/imbalance, and sensitivity to light that typically respond to Tylenol but more recently, she had a headache that lasted 3 days, until she took her migraine medication and then it resolved.  She did have another less severe "migraine" headache on 03/11/2022 and this immediately responded to Tylenol and her migraine pill.  She has not had any further headaches or dizziness/imbalance since that time.  Currently, she is without complaints aside from her usual mild fatigue in the afternoons but overall, is pleased with her progress to date. She continues to tolerate the Tagrisso fairly well.     ALLERGIES:  is allergic to celebrex [celecoxib] and minocycline.  Meds: Current Outpatient Medications  Medication Sig Dispense Refill   acetaminophen (TYLENOL) 500 MG tablet Take 500 mg by mouth every 6 (six) hours as needed for moderate pain or headache.     albuterol (VENTOLIN HFA) 108 (90 Base) MCG/ACT inhaler Inhale 2 puffs into the lungs every 4 (four) hours as needed.     ALPRAZolam (XANAX) 0.25 MG tablet Take 0.25 mg by mouth every 4 (four) hours as needed for anxiety. for anxiety     aspirin EC 81 MG tablet Take 81 mg by mouth daily. Swallow whole.     citalopram (CELEXA) 10 MG tablet Take 10 mg by mouth at bedtime.     clindamycin (CLEOCIN T) 1 % external solution Apply topically 2 (two) times daily. 30 mL 0   clindamycin-benzoyl peroxide (BENZACLIN) gel Apply to face 2 times a day 25 g 1   diclofenac sodium (VOLTAREN)  1 % GEL Apply topically as needed. To affected area     diphenhydrAMINE (BENADRYL) 25 MG tablet Take 25 mg by mouth See admin instructions. 25 mg at bedtime 25 mg as needed for itching     Docusate Sodium (STOOL SOFTENER LAXATIVE PO) Take 1 tablet by mouth as needed.     eletriptan (RELPAX) 20 MG tablet Take 20 mg by mouth as needed for headache (at onset of migraine.). May repeat in 2 hours if needed.     fluconazole (DIFLUCAN) 200 MG tablet Take 1  tablet (200 mg total) by mouth daily. 14 tablet 0   fluticasone (FLONASE) 50 MCG/ACT nasal spray Place 2 sprays into both nostrils daily as needed for allergies.     HYDROcodone-acetaminophen (HYCET) 7.5-325 mg/15 ml solution Take 10 mLs by mouth every 4 (four) hours as needed for moderate pain. 473 mL 0   hydrOXYzine (ATARAX/VISTARIL) 25 MG tablet Take 25 mg by mouth See admin instructions. 25 mg at bedtime 25 mg during the day as needed for itching/anxiety     loratadine (CLARITIN) 10 MG tablet Take 10 mg by mouth daily.     magic mouthwash (nystatin, lidocaine, diphenhydrAMINE, alum & mag hydroxide) suspension Swish and swallow 5 mls by mouth every 6 hours as needed 250 mL 1   magic mouthwash w/lidocaine SOLN Take 5 mLs by mouth every 6 hours as needed for mouth pain. Swish and Swallow (Patient not taking: Reported on 08/19/2021) 250 mL 1   montelukast (SINGULAIR) 10 MG tablet Take 10 mg by mouth at bedtime.     nystatin (MYCOSTATIN/NYSTOP) powder Apply 1 application topically 3 (three) times daily. 60 g 1   ondansetron (ZOFRAN-ODT) 4 MG disintegrating tablet DISSOLVE 1 TABLET IN MOUTH EVERY 8 HOURS AS NEEDED FOR NAUSEA AND VOMITING 18 tablet 1   osimertinib mesylate (TAGRISSO) 80 MG tablet Take 1 tablet (80 mg total) by mouth daily. 30 tablet 2   predniSONE (DELTASONE) 5 MG tablet Take 1 tablet (5 mg total) by mouth daily with breakfast. Take 2 tabs daily for 7 days then 1 tab daily 40 tablet 0   prochlorperazine (COMPAZINE) 10 MG tablet TAKE 1 TABLET BY MOUTH EVERY 6 HOURS AS NEEDED FOR NAUSEA FOR VOMITING 30 tablet 1   solifenacin (VESICARE) 10 MG tablet Take 10 mg by mouth every evening.     sucralfate (CARAFATE) 1 g tablet Take 1 tablet (1 g total) by mouth 4 (four) times daily -  with meals and at bedtime. 5 min before meals for radiation induced esophagitis 120 tablet 2   zolpidem (AMBIEN) 10 MG tablet Take 1 tablet (10 mg total) by mouth at bedtime as needed for sleep. 30 tablet 0   No  current facility-administered medications for this encounter.    Physical Findings:  vitals were not taken for this visit.  Pain Assessment Pain Score: 8  (Headaches)/10 Unable to assess due to telephone follow-up visit format.  Lab Findings: Lab Results  Component Value Date   WBC 2.4 (L) 02/10/2022   HGB 11.1 (L) 02/10/2022   HCT 33.1 (L) 02/10/2022   MCV 83.8 02/10/2022   PLT 153 02/10/2022     Radiographic Findings: MR Brain W Wo Contrast  Result Date: 03/15/2022 CLINICAL DATA:  Brain metastasis. Assess treatment response. Non-small cell lung cancer. EXAM: MRI HEAD WITHOUT AND WITH CONTRAST TECHNIQUE: Multiplanar, multiecho pulse sequences of the brain and surrounding structures were obtained without and with intravenous contrast. CONTRAST:  10 mL it V[employer] COMPARISON:  MR head without and with contrast 11/28/2021 and 08/16/2021 FINDINGS: Brain: Lesions are marked on the reconstructed axial postcontrast T1 weighted images, series 1100 The focus of enhancement identified in the left middle frontal gyrus on image 247 on the prior exam is no longer present. The lesion the posterior left parietal lobe on image 227 has decreased in size since the prior exam, now measuring 3.7 mm maximally. Inferior left frontal lobe lesion on image 155 is also decreased in size. Maximal measurement is now 5 mm. This compares with 7 mm on the prior exam. No new lesions are present. Linear enhancement in the medial right frontal lobe on image 218 is vascular. Minimal T2 hyperintensity is present in the left parietal lesion. No other significant T2 or FLAIR hyperintensities are present. No acute hemorrhage is present. The ventricles are of normal size. No significant extraaxial fluid collection is present. The internal auditory canals are within normal limits. The brainstem and cerebellum are within normal limits. Vascular: Flow is present in the major intracranial arteries. Skull and upper cervical spine: The  craniocervical junction is normal. Upper cervical spine is within normal limits. Marrow signal is unremarkable. Sinuses/Orbits: Inferior bilateral mastoid effusions are present. No obstructing nasopharyngeal lesion is present. The paranasal sinuses are otherwise clear. The globes and orbits are within normal limits. IMPRESSION: 1. Previously identified enhancing lesions in the left middle frontal gyrus is no longer visible. 2. Lesions and inferior left frontal lobe and high posterior left parietal lobe have decreased in size. 3. No new lesions are present. 4. Inferior bilateral mastoid effusions. No obstructing nasopharyngeal lesion is present. Electronically Signed   By: San Morelle M.D.   On: 03/15/2022 19:36    Impression/Plan: 1. 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 She appears to have recovered well from the effects of her recent SRS brain treatment and is currently without complaints.  Her recent posttreatment MRI brain scan from 03/14/2022 shows an excellent response to treatment without evidence of disease recurrence or progression.  We discussed the plan to proceed with serial MRI brain scans every 3 months going forward, to continue to monitor for any evidence of disease recurrence or progression. I will call her to review the results and any recommendations from the multidisciplinary brain conference following each scan. She will also continue in routine follow up with Dr. Burr Medico for continued management of her systemic disease. She continues to tolerate the Tagrisso fairly well and her most recent disease restaging imaging from 02/10/2022 continues to show excellent response to treatment with decreased size of left supraclavicular and mediastinal lymph nodes and the treated left upper lobe lung mass no longer visible. Her next scheduled follow-up visit with Dr. Burr Medico is scheduled for 04/15/2022 with plans  for disease restaging imaging prior to that visit.  She appears to have a good understanding of our recommendations and is comfortable and in agreement with the stated plan. I look forward to connecting with her by telephone in December, to review MRI results but she knows that she is welcome to call with any questions or concerns at any time in the interim.   I personally spent 20 minutes in this encounter including chart review, reviewing radiological studies, telephone conversation with the patient, entering orders coordinating her care and completing documentation.     Nicholos Johns, PA-C

## 2022-03-21 ENCOUNTER — Encounter: Payer: Self-pay | Admitting: Hematology

## 2022-03-21 ENCOUNTER — Other Ambulatory Visit (HOSPITAL_COMMUNITY): Payer: Self-pay

## 2022-04-02 ENCOUNTER — Other Ambulatory Visit (HOSPITAL_COMMUNITY): Payer: Self-pay

## 2022-04-15 ENCOUNTER — Inpatient Hospital Stay: Payer: BC Managed Care – PPO

## 2022-04-15 ENCOUNTER — Inpatient Hospital Stay: Payer: BC Managed Care – PPO | Attending: Hematology | Admitting: Hematology

## 2022-04-15 ENCOUNTER — Telehealth: Payer: Self-pay | Admitting: Pharmacy Technician

## 2022-04-15 ENCOUNTER — Encounter: Payer: Self-pay | Admitting: Hematology

## 2022-04-15 ENCOUNTER — Other Ambulatory Visit (HOSPITAL_COMMUNITY): Payer: Self-pay

## 2022-04-15 VITALS — BP 131/55 | HR 77 | Temp 97.9°F | Resp 19 | Ht 60.0 in | Wt 243.8 lb

## 2022-04-15 DIAGNOSIS — C3412 Malignant neoplasm of upper lobe, left bronchus or lung: Secondary | ICD-10-CM | POA: Insufficient documentation

## 2022-04-15 DIAGNOSIS — Z7982 Long term (current) use of aspirin: Secondary | ICD-10-CM | POA: Diagnosis not present

## 2022-04-15 DIAGNOSIS — Z79899 Other long term (current) drug therapy: Secondary | ICD-10-CM | POA: Insufficient documentation

## 2022-04-15 DIAGNOSIS — C7931 Secondary malignant neoplasm of brain: Secondary | ICD-10-CM | POA: Insufficient documentation

## 2022-04-15 DIAGNOSIS — R0602 Shortness of breath: Secondary | ICD-10-CM | POA: Diagnosis not present

## 2022-04-15 DIAGNOSIS — Z7952 Long term (current) use of systemic steroids: Secondary | ICD-10-CM | POA: Diagnosis not present

## 2022-04-15 LAB — CMP (CANCER CENTER ONLY)
ALT: 19 U/L (ref 0–44)
AST: 20 U/L (ref 15–41)
Albumin: 4.1 g/dL (ref 3.5–5.0)
Alkaline Phosphatase: 74 U/L (ref 38–126)
Anion gap: 6 (ref 5–15)
BUN: 21 mg/dL — ABNORMAL HIGH (ref 6–20)
CO2: 27 mmol/L (ref 22–32)
Calcium: 9.4 mg/dL (ref 8.9–10.3)
Chloride: 104 mmol/L (ref 98–111)
Creatinine: 1.3 mg/dL — ABNORMAL HIGH (ref 0.44–1.00)
GFR, Estimated: 49 mL/min — ABNORMAL LOW (ref >60)
Glucose, Bld: 88 mg/dL (ref 70–99)
Potassium: 4.2 mmol/L (ref 3.5–5.1)
Sodium: 137 mmol/L (ref 135–145)
Total Bilirubin: 0.4 mg/dL (ref 0.3–1.2)
Total Protein: 7.2 g/dL (ref 6.5–8.1)

## 2022-04-15 LAB — CBC WITH DIFFERENTIAL (CANCER CENTER ONLY)
Abs Immature Granulocytes: 0 10*3/uL (ref 0.00–0.07)
Basophils Absolute: 0 10*3/uL (ref 0.0–0.1)
Basophils Relative: 0 %
Eosinophils Absolute: 0 10*3/uL (ref 0.0–0.5)
Eosinophils Relative: 0 %
HCT: 33.1 % — ABNORMAL LOW (ref 36.0–46.0)
Hemoglobin: 11.1 g/dL — ABNORMAL LOW (ref 12.0–15.0)
Immature Granulocytes: 0 %
Lymphocytes Relative: 21 %
Lymphs Abs: 0.7 10*3/uL (ref 0.7–4.0)
MCH: 28.5 pg (ref 26.0–34.0)
MCHC: 33.5 g/dL (ref 30.0–36.0)
MCV: 85.1 fL (ref 80.0–100.0)
Monocytes Absolute: 0.4 10*3/uL (ref 0.1–1.0)
Monocytes Relative: 12 %
Neutro Abs: 2.4 10*3/uL (ref 1.7–7.7)
Neutrophils Relative %: 67 %
Platelet Count: 151 10*3/uL (ref 150–400)
RBC: 3.89 MIL/uL (ref 3.87–5.11)
RDW: 13.5 % (ref 11.5–15.5)
WBC Count: 3.5 10*3/uL — ABNORMAL LOW (ref 4.0–10.5)
nRBC: 0 % (ref 0.0–0.2)

## 2022-04-15 NOTE — Telephone Encounter (Signed)
Oral Oncology Patient Advocate Encounter   Received notification that prior authorization for Tagrisso is required.   PA submitted on 04/15/2022 Abigail Blossom PA must be completed via phone [phone] Status is pending     Abigail Golden, Abigail Golden Patient [place] Direct Number: [phone]  Fax: [phone]

## 2022-04-15 NOTE — Progress Notes (Signed)
[place]   [phone] [phone]   Clinic Follow up Note   Patient Care Team: Aletha Halim., PA-C as PCP - General (Family Medicine) Truitt Merle, MD as Consulting Physician (Hematology and Oncology) Tyler Pita, MD as Consulting Physician (Radiation Oncology)  Date of Service:  04/15/2022  CHIEF COMPLAINT: f/u of metastatic lung cancer  CURRENT THERAPY:  Tagrisso, 80 mg daily, started 05/22/21  ASSESSMENT & PLAN:  Abigail Golden is a 56 y.o. female with   1. 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 Brain MRI showed multifocal metastatic disease. [See #3] -staging CT CAP 04/23/21 showed: 4.5 cm LUL mass that abuts pleural surface; no lymphadenopathy or metastatic disease. -bronchoscopy on 04/24/21 under Dr. Valeta Harms, path showed adenocarcinoma, but insufficient tissue for molecular studies. Guardant 360 showed EGFR L858R mutation, which predicts good response to EGFR inhibitor. -PET scan 05/13/21 showed LUL mass extending along superior mediastinal border from hilum towards apex.  No lymph node or other distant metastasis. -she started Tagrisso on 05/22/21. She tolerates moderately well with mucositis and skin toxicities.  -PET scan on 08/28/21 showed: similar size and metabolic activity to primary left lung mass; new hypermetabolic ipsilateral nodal metastasis to prevascular nodal space, left lower paratracheal nodal station, and left supraclavicular.  -she completed concurrent radiation therapy to the LUL under Dr. Tammi Klippel 09/19/21 - 11/04/21. She has recovered well -restaging CT CAP 02/10/22 showed radiation changes without discrete lung mass and decreased size of lymph nodes. Plan to repeat in 4 months. Will continue tagrisso for now. -she continues to tolerate tagrisso well with  mild skin toxicities. Labs reviewed, CBC is overall stable.  -plan for repeat CT in about 4-5 weeks   2. Brain Metastases -initially seen on brain MRI 04/23/21 -s/p SRS under Dr. Tammi Klippel 05/08/21 - 05/13/21 (3 fractions), 08/27/21, and 12/10/21. -recent brain MRI on 03/15/2022 showed great response, no new lesions       Plan:  -continue Tagrisso 23m daily, she is clinically doing well overall  -f/u in 5 weeks ~11/30 with lab and CT several days before   No problem-specific Assessment & Plan notes found for this encounter.   SUMMARY OF ONCOLOGIC HISTORY: Oncology History Overview Note   Cancer Staging  Primary adenocarcinoma of upper lobe of left lung ([place] Staging form: Lung, AJCC 8th Edition - Clinical stage from 04/24/2021: Stage IVA (cT2, cN2, pM1b) - Signed by FTruitt Merle MD on 05/14/2021    Primary adenocarcinoma of upper lobe of left lung ([facility]  04/22/2021 Imaging   CT HEAD WO CONTRAST   IMPRESSION: Two separate areas of vasogenic edema within the left hemisphere, 1 within the inferior frontal region in the other at the left parietal vertex. Metastatic disease would be the most likely cause of this appearance. Other possibilities include venous infarctions and cerebritis. MRI with contrast recommended.    04/23/2021 Imaging   MR Brain W and Wo Contrast  IMPRESSION: 1. Metastatic disease to the brain. Two enhancing brain masses with moderate associated vasogenic edema: solid 16 mm left parietal lobe mass, and a larger 29 mm rim enhancing cystic or necrotic mass in the left inferior frontal gyrus. Two other small 2-3 mm metastases in the right inferior frontal gyrus, right posterior temporal lobe. And questionable two additional punctate metastases in both superior frontal gyri abutting the falx (series 18, image 46). 2. No  significant intracranial mass effect. No other intracranial abnormality.   04/23/2021 Imaging   CT Chest W Contrast  IMPRESSION: 4.5 cm spiculated left  upper lobe mass most compatible with primary lung cancer. This abuts the medial pleural surface. A few small adjacent pre-vascular mediastinal lymph nodes, none pathologically enlarged. Recommend cardiothoracic surgical and oncologic consultation.   No acute findings or evidence of metastatic disease in the abdomen or pelvis.   04/24/2021 Cancer Staging   Staging form: Lung, AJCC 8th Edition - Clinical stage from 04/24/2021: Stage IVA (cT2, cN2, pM1b) - Signed by Truitt Merle, MD on 05/14/2021   04/24/2021 Initial Biopsy   FINAL MICROSCOPIC DIAGNOSIS:   A. LUNG, LUL, FINE NEEDLE ASPIRATION:  - Malignant cells present, consistent with adenocarcinoma   B. LUNG, LUL, BRUSHING:  - Malignant cells consistent with adenocarcinoma, see comment    COMMENT:  B.  Immunohistochemical stains show that the tumor cells are positive for TTF-1 while they are negative for p63 and CK5/6, consistent with above interpretation. The number of tumor cells appears to be insufficient for molecular studies.   05/03/2021 Initial Diagnosis   Lung cancer ([place])   05/08/2021 - 05/13/2021 Radiation Therapy   SRS treatment to the metastatic brain lesions under the care of Dr. Tammi Klippel    08/05/2021 Imaging   EXAM: CT CHEST, ABDOMEN, AND PELVIS WITH CONTRAST  IMPRESSION: 1. No substantial change in size of the left upper lobe pulmonary lesion. 2. Borderline to mildly enlarged adjacent prevascular/AP window lymph nodes have increased in size in the interval. This finding raises concern for disease progression. 3. Interval development of upper normal bilateral common iliac and left external iliac lymph nodes. Close attention on follow-up recommended. 4. No other evidence for metastatic disease in the abdomen or pelvis.      INTERVAL HISTORY:  Abigail Golden is here for a follow up of metastatic lung cancer. She was last seen by me on 02/13/22. She presents to the clinic accompanied by [facility]. She reports mild SOB.  She notes she is able to do the majority of things on her own. She tells me she has gone on trips and has been trying to walk more. She reports she is having issues with her nails becoming brittle.    All other systems were reviewed with the patient and are negative.  MEDICAL HISTORY:  Past Medical History:  Diagnosis Date   Allergy    Anxiety    Chronic bronchitis ([place])    Depression    Headache    Hypoglycemia    Lower back pain    since fall ~ 2012 (05/25/2015)   Migraine    05/25/2015 "probably once/month"   Migraine headache    Pain in left hip    since fall ~ 2012 (05/25/2015)   Pneumonia "several times"    SURGICAL HISTORY: Past Surgical History:  Procedure Laterality Date   ABDOMINAL HYSTERECTOMY  11/2002   "found benign tumors"   BRONCHIAL BIOPSY  04/24/2021   Procedure: BRONCHIAL BIOPSIES;  Surgeon: Garner Nash, DO;  Location: [place] [facility];  Service: Pulmonary;;   BRONCHIAL BRUSHINGS  04/24/2021   Procedure: BRONCHIAL BRUSHINGS;  Surgeon: Garner Nash, DO;  Location: [facility];  Service: Pulmonary;;   BRONCHIAL NEEDLE ASPIRATION BIOPSY  04/24/2021   Procedure: BRONCHIAL NEEDLE ASPIRATION BIOPSIES;  Surgeon: Garner Nash, DO;  Location: [place] [facility];  Service: Pulmonary;;   CHEST TUBE INSERTION  04/24/2021   Procedure: CHEST TUBE INSERTION;  Surgeon: Garner Nash, DO;  Location: [facility];  Service: Pulmonary;;   CHOLECYSTECTOMY N/A 05/25/2015   Procedure: LAPAROSCOPIC CHOLECYSTECTOMY;  Surgeon: Ralene Ok, MD;  Location: [facility];  Service: General;  Laterality: N/A;   LAPAROSCOPIC CHOLECYSTECTOMY  05/25/2015   MENISCUS REPAIR Right 03/2017   VIDEO BRONCHOSCOPY WITH ENDOBRONCHIAL NAVIGATION N/A 04/24/2021   Procedure: VIDEO BRONCHOSCOPY WITH ENDOBRONCHIAL NAVIGATION;  Surgeon: Garner Nash, DO;  Location: [place];  Service: Pulmonary;  Laterality: N/A;   VIDEO BRONCHOSCOPY WITH RADIAL ENDOBRONCHIAL ULTRASOUND  04/24/2021   Procedure: VIDEO  BRONCHOSCOPY WITH RADIAL ENDOBRONCHIAL ULTRASOUND;  Surgeon: Garner Nash, DO;  Location: [facility];  Service: Pulmonary;;    I have reviewed the social history and family history with the patient and they are unchanged from previous note.  ALLERGIES:  is allergic to celebrex [celecoxib] and minocycline.  MEDICATIONS:  Current Outpatient Medications  Medication Sig Dispense Refill   acetaminophen (TYLENOL) 500 MG tablet Take 500 mg by mouth every 6 (six) hours as needed for moderate pain or headache.     albuterol (VENTOLIN HFA) 108 (90 Base) MCG/ACT inhaler Inhale 2 puffs into the lungs every 4 (four) hours as needed.     ALPRAZolam (XANAX) 0.25 MG tablet Take 0.25 mg by mouth every 4 (four) hours as needed for anxiety. for anxiety     aspirin EC 81 MG tablet Take 81 mg by mouth daily. Swallow whole.     citalopram (CELEXA) 10 MG tablet Take 10 mg by mouth at bedtime.     clindamycin (CLEOCIN T) 1 % external solution Apply topically 2 (two) times daily. 30 mL 0   clindamycin-benzoyl peroxide (BENZACLIN) gel Apply to face 2 times a day 25 g 1   diclofenac sodium (VOLTAREN) 1 % GEL Apply topically as needed. To affected area     diphenhydrAMINE (BENADRYL) 25 MG tablet Take 25 mg by mouth See admin instructions. 25 mg at bedtime 25 mg as needed for itching     Docusate Sodium (STOOL SOFTENER LAXATIVE PO) Take 1 tablet by mouth as needed.     eletriptan (RELPAX) 20 MG tablet Take 20 mg by mouth as needed for headache (at onset of migraine.). May repeat in 2 hours if needed.     fluconazole (DIFLUCAN) 200 MG tablet Take 1 tablet (200 mg total) by mouth daily. 14 tablet 0   fluticasone (FLONASE) 50 MCG/ACT nasal spray Place 2 sprays into both nostrils daily as needed for allergies.     HYDROcodone-acetaminophen (HYCET) 7.5-325 mg/15 ml solution Take 10 mLs by mouth every 4 (four) hours as needed for moderate pain. 473 mL 0   hydrOXYzine (ATARAX/VISTARIL) 25 MG tablet Take 25 mg by mouth  See admin instructions. 25 mg at bedtime 25 mg during the day as needed for itching/anxiety     loratadine (CLARITIN) 10 MG tablet Take 10 mg by mouth daily.     magic mouthwash (nystatin, lidocaine, diphenhydrAMINE, alum & mag hydroxide) suspension Swish and swallow 5 mls by mouth every 6 hours as needed 250 mL 1   magic mouthwash w/lidocaine SOLN Take 5 mLs by mouth every 6 hours as needed for mouth pain. Swish and Swallow (Patient not taking: Reported on 08/19/2021) 250 mL 1   montelukast (SINGULAIR) 10 MG tablet Take 10 mg by mouth at bedtime.     nystatin (MYCOSTATIN/NYSTOP) powder Apply 1 application topically 3 (three) times daily. 60 g 1   ondansetron (ZOFRAN-ODT) 4 MG disintegrating tablet DISSOLVE 1 TABLET IN MOUTH EVERY 8 HOURS AS  NEEDED FOR NAUSEA AND VOMITING 18 tablet 1   osimertinib mesylate (TAGRISSO) 80 MG tablet Take 1 tablet (80 mg total) by mouth daily. 30 tablet 2   predniSONE (DELTASONE) 5 MG tablet Take 1 tablet (5 mg total) by mouth daily with breakfast. Take 2 tabs daily for 7 days then 1 tab daily 40 tablet 0   prochlorperazine (COMPAZINE) 10 MG tablet TAKE 1 TABLET BY MOUTH EVERY 6 HOURS AS NEEDED FOR NAUSEA FOR VOMITING 30 tablet 1   solifenacin (VESICARE) 10 MG tablet Take 10 mg by mouth every evening.     sucralfate (CARAFATE) 1 g tablet Take 1 tablet (1 g total) by mouth 4 (four) times daily -  with meals and at bedtime. 5 min before meals for radiation induced esophagitis 120 tablet 2   zolpidem (AMBIEN) 10 MG tablet Take 1 tablet (10 mg total) by mouth at bedtime as needed for sleep. 30 tablet 0   No current facility-administered medications for this visit.    PHYSICAL EXAMINATION: ECOG PERFORMANCE STATUS: 1 - Symptomatic but completely ambulatory  Vitals:   04/15/22 1257  BP: (!) 131/55  Pulse: 77  Resp: 19  Temp: 97.9 F (36.6 C)  SpO2: 100%   Wt Readings from Last 3 Encounters:  04/15/22 243 lb 12.8 oz (110.6 kg)  01/14/22 240 lb 9.6 oz (109.1 kg)   12/10/21 244 lb 12.8 oz (111 kg)     GENERAL:alert, no distress and comfortable SKIN: skin color, texture, turgor are normal, no rashes or significant lesions EYES: normal, Conjunctiva are pink and non-injected, sclera clear  NECK: supple, thyroid normal size, non-tender, without nodularity LYMPH:  no palpable lymphadenopathy in the cervical, axillary  LUNGS: clear to auscultation and percussion with normal breathing effort HEART: regular rate & rhythm and no murmurs and no lower extremity edema ABDOMEN:abdomen soft, non-tender and normal bowel sounds Musculoskeletal:no cyanosis of digits and no clubbing  NEURO: alert & oriented x 3 with fluent speech, no focal motor/sensory deficits  LABORATORY DATA:  I have reviewed the data as listed    Latest Ref Rng & Units 04/15/2022   12:42 PM 02/10/2022    2:55 PM 01/14/2022    1:57 PM  CBC  WBC 4.0 - 10.5 K/uL 3.5  2.4  2.5   Hemoglobin 12.0 - 15.0 g/dL 11.1  11.1  10.0   Hematocrit 36.0 - 46.0 % 33.1  33.1  30.0   Platelets 150 - 400 K/uL 151  153  180         Latest Ref Rng & Units 04/15/2022   12:42 PM 02/10/2022    2:55 PM 01/14/2022    1:57 PM  CMP  Glucose 70 - 99 mg/dL 88  97  107   BUN 6 - 20 mg/dL _0 Creatinine 0.44 - 1.00 mg/dL 1.30  1.25  0.99   Sodium 135 - 145 mmol/L 137  136  138   Potassium 3.5 - 5.1 mmol/L 4.2  4.0  3.7   Chloride 98 - 111 mmol/L 104  105  103   CO2 22 - 32 mmol/L _1 Calcium 8.9 - 10.3 mg/dL 9.4  9.5  9.1   Total Protein 6.5 - 8.1 g/dL 7.2  7.0  6.9   Total Bilirubin 0.3 - 1.2 mg/dL 0.4  0.5  0.5   Alkaline Phos 38 - 126 U/L 74  66  74   AST 15 - 41 U/L  _0 ALT 0 - 44 U/L _1 RADIOGRAPHIC STUDIES: I have personally reviewed the radiological images as listed and agreed with the findings in the report. No results found.    Orders Placed This Encounter  Procedures   CT CHEST ABDOMEN PELVIS W CONTRAST    Standing Status:   Future    Standing  Expiration Date:   04/16/2023    Order Specific Question:   Is patient pregnant?    Answer:   No    Order Specific Question:   Preferred imaging location?    Answer:   [facility]    Order Specific Question:   Is Oral Contrast requested for this exam?    Answer:   Yes, Per Radiology protocol   All questions were answered. The patient knows to call the clinic with any problems, questions or concerns. No barriers to learning was detected. The total time spent in the appointment was 30 minutes.     Truitt Merle, MD 04/15/2022   I, Wilburn Mylar, am acting as scribe for Truitt Merle, MD.   I have reviewed the above documentation for accuracy and completeness, and I agree with the above.

## 2022-04-15 NOTE — Telephone Encounter (Signed)
Oral Oncology Patient Advocate Encounter  Prior Authorization for Abigail Golden has been approved.    PA# [id number] Effective dates: 04/15/2022 through 04/16/2023  Patient may continue to fill at [facility].    Lady Deutscher, CPhT-Adv Oncology Pharmacy Patient [place] Direct Number: [phone]  Fax: [phone]

## 2022-04-17 ENCOUNTER — Other Ambulatory Visit (HOSPITAL_COMMUNITY): Payer: Self-pay

## 2022-04-24 ENCOUNTER — Other Ambulatory Visit: Payer: Self-pay | Admitting: Radiation Therapy

## 2022-04-24 DIAGNOSIS — C7931 Secondary malignant neoplasm of brain: Secondary | ICD-10-CM

## 2022-04-30 ENCOUNTER — Other Ambulatory Visit (HOSPITAL_COMMUNITY): Payer: Self-pay

## 2022-05-01 ENCOUNTER — Other Ambulatory Visit (HOSPITAL_COMMUNITY): Payer: Self-pay

## 2022-05-01 ENCOUNTER — Other Ambulatory Visit: Payer: Self-pay | Admitting: Hematology

## 2022-05-01 MED ORDER — OSIMERTINIB MESYLATE 80 MG PO TABS
80.0000 mg | ORAL_TABLET | Freq: Every day | ORAL | 2 refills | Status: DC
  Filled 2022-05-02: qty 30, 30d supply, fill #0
  Filled 2022-06-06: qty 30, 30d supply, fill #1
  Filled 2022-07-10: qty 30, 30d supply, fill #2

## 2022-05-02 ENCOUNTER — Other Ambulatory Visit (HOSPITAL_COMMUNITY): Payer: Self-pay

## 2022-05-06 ENCOUNTER — Telehealth: Payer: Self-pay

## 2022-05-06 ENCOUNTER — Other Ambulatory Visit: Payer: Self-pay

## 2022-05-06 ENCOUNTER — Other Ambulatory Visit: Payer: Self-pay | Admitting: Hematology

## 2022-05-06 MED ORDER — ONDANSETRON 4 MG PO TBDP: ORAL_TABLET | ORAL | 1 refills | Status: DC

## 2022-05-06 NOTE — Telephone Encounter (Signed)
Pt requesting refill on Ambien.  Notified Dr. Burr Medico of pt's request.

## 2022-05-07 ENCOUNTER — Other Ambulatory Visit: Payer: Self-pay | Admitting: Hematology

## 2022-05-07 ENCOUNTER — Encounter: Payer: Self-pay | Admitting: Hematology

## 2022-05-07 MED ORDER — ZOLPIDEM TARTRATE 10 MG PO TABS: 10.0000 mg | ORAL_TABLET | Freq: Every evening | ORAL | 0 refills | Status: DC | PRN

## 2022-05-21 ENCOUNTER — Other Ambulatory Visit: Payer: Self-pay

## 2022-05-21 ENCOUNTER — Other Ambulatory Visit (HOSPITAL_COMMUNITY): Payer: Self-pay

## 2022-05-21 ENCOUNTER — Inpatient Hospital Stay: Payer: BC Managed Care – PPO | Attending: Hematology

## 2022-05-21 ENCOUNTER — Ambulatory Visit (HOSPITAL_COMMUNITY)
Admission: RE | Admit: 2022-05-21 | Discharge: 2022-05-21 | Disposition: A | Discharge status: Home / Self Care | Payer: BC Managed Care – PPO | Source: Ambulatory Visit | Attending: Hematology | Admitting: Hematology

## 2022-05-21 DIAGNOSIS — C3412 Malignant neoplasm of upper lobe, left bronchus or lung: Secondary | ICD-10-CM

## 2022-05-21 LAB — CMP (CANCER CENTER ONLY)
ALT: 16 U/L (ref 0–44)
AST: 18 U/L (ref 15–41)
Albumin: 4.2 g/dL (ref 3.5–5.0)
Alkaline Phosphatase: 84 U/L (ref 38–126)
Anion gap: 6 (ref 5–15)
BUN: 20 mg/dL (ref 6–20)
CO2: 26 mmol/L (ref 22–32)
Calcium: 9.6 mg/dL (ref 8.9–10.3)
Chloride: 103 mmol/L (ref 98–111)
Creatinine: 1.2 mg/dL — ABNORMAL HIGH (ref 0.44–1.00)
GFR, Estimated: 54 mL/min — ABNORMAL LOW (ref >60)
Glucose, Bld: 98 mg/dL (ref 70–99)
Potassium: 4.4 mmol/L (ref 3.5–5.1)
Sodium: 135 mmol/L (ref 135–145)
Total Bilirubin: 0.5 mg/dL (ref 0.3–1.2)
Total Protein: 7.6 g/dL (ref 6.5–8.1)

## 2022-05-21 LAB — CBC WITH DIFFERENTIAL (CANCER CENTER ONLY)
Abs Immature Granulocytes: 0.01 10*3/uL (ref 0.00–0.07)
Basophils Absolute: 0 10*3/uL (ref 0.0–0.1)
Basophils Relative: 0 %
Eosinophils Absolute: 0 10*3/uL (ref 0.0–0.5)
Eosinophils Relative: 0 %
HCT: 32.1 % — ABNORMAL LOW (ref 36.0–46.0)
Hemoglobin: 10.8 g/dL — ABNORMAL LOW (ref 12.0–15.0)
Immature Granulocytes: 0 %
Lymphocytes Relative: 23 %
Lymphs Abs: 0.8 10*3/uL (ref 0.7–4.0)
MCH: 28.3 pg (ref 26.0–34.0)
MCHC: 33.6 g/dL (ref 30.0–36.0)
MCV: 84.3 fL (ref 80.0–100.0)
Monocytes Absolute: 0.4 10*3/uL (ref 0.1–1.0)
Monocytes Relative: 12 %
Neutro Abs: 2.2 10*3/uL (ref 1.7–7.7)
Neutrophils Relative %: 65 %
Platelet Count: 168 10*3/uL (ref 150–400)
RBC: 3.81 MIL/uL — ABNORMAL LOW (ref 3.87–5.11)
RDW: 13.3 % (ref 11.5–15.5)
WBC Count: 3.4 10*3/uL — ABNORMAL LOW (ref 4.0–10.5)
nRBC: 0 % (ref 0.0–0.2)

## 2022-05-21 MED ORDER — IOHEXOL 300 MG/ML  SOLN
100.0000 mL | Freq: Once | INTRAMUSCULAR | Status: AC | PRN
  Administered 2022-05-21: 100 mL via INTRAVENOUS

## 2022-05-21 MED ORDER — SODIUM CHLORIDE (PF) 0.9 % IJ SOLN
INTRAMUSCULAR | Status: AC
  Filled 2022-05-21: qty 50

## 2022-05-26 ENCOUNTER — Encounter: Payer: Self-pay | Admitting: Hematology

## 2022-05-26 ENCOUNTER — Other Ambulatory Visit: Payer: Self-pay

## 2022-05-26 ENCOUNTER — Inpatient Hospital Stay: Payer: BC Managed Care – PPO | Attending: Hematology | Admitting: Hematology

## 2022-05-26 VITALS — BP 140/66 | HR 78 | Temp 98.2°F | Resp 17 | Wt 243.2 lb

## 2022-05-26 DIAGNOSIS — Z79899 Other long term (current) drug therapy: Secondary | ICD-10-CM | POA: Insufficient documentation

## 2022-05-26 DIAGNOSIS — Z923 Personal history of irradiation: Secondary | ICD-10-CM | POA: Insufficient documentation

## 2022-05-26 DIAGNOSIS — Z7189 Other specified counseling: Secondary | ICD-10-CM | POA: Diagnosis not present

## 2022-05-26 DIAGNOSIS — R059 Cough, unspecified: Secondary | ICD-10-CM | POA: Diagnosis not present

## 2022-05-26 DIAGNOSIS — Z7982 Long term (current) use of aspirin: Secondary | ICD-10-CM | POA: Insufficient documentation

## 2022-05-26 DIAGNOSIS — C7931 Secondary malignant neoplasm of brain: Secondary | ICD-10-CM | POA: Diagnosis present

## 2022-05-26 DIAGNOSIS — C3412 Malignant neoplasm of upper lobe, left bronchus or lung: Secondary | ICD-10-CM | POA: Insufficient documentation

## 2022-05-26 NOTE — Assessment & Plan Note (Signed)
-  We again discussed the incurable nature of her cancer, and the overall poor prognosis, especially if she does not have good response to chemotherapy or progress on chemo -The patient understands the goal of care is palliative. -I recommend DNR/DNI, she will think about it

## 2022-05-26 NOTE — Progress Notes (Signed)
[place]   [phone] [phone]   Clinic Follow up Note   Patient Care Team: Aletha Halim., PA-C as PCP - General (Family Medicine) Truitt Merle, MD as Consulting Physician (Hematology and Oncology) Tyler Pita, MD as Consulting Physician (Radiation Oncology)  Date of Service:  05/26/2022  CHIEF COMPLAINT: f/u of metastatic lung cancer   CURRENT THERAPY:  Tagrisso, 80 mg daily, started 05/22/21   ASSESSMENT:  Abigail Golden is a 56 y.o. female with   Malignant neoplasm metastatic to brain [facility]) -s/p SRS under Dr. Tammi Klippel 05/08/21 - 05/13/21 (3 fractions), 08/27/21, and 12/10/21. 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 She tolerates moderately well with mucositis and skin toxicities.   -she had thoracic radiation (The primary tumor and involved mediastinal adenopathy were treated to 66 Gy in 33 fractions of 2 Gy), completed on 11/04/2021 -I personally reviewed her recent restaging CT scan from May 22, 2022, which showed continued response in the primary site and thoracic adenopathy, or new lesions. -She is clinically doing better, lives independently, tolerating Taxol well, will continue.    Goals of care, counseling/discussion -We again discussed the incurable nature of her cancer, and the overall poor prognosis, especially if she does not have good response to chemotherapy or progress on chemo -The patient understands the goal of care is palliative. -I recommend DNR/DNI, she will think about it      PLAN: -CT scan reviewed, good PR -brain MRI Scheduled for 12/21  -Lab reviewed  -continue Tagrisso same dose  F/u in 2 months, next scan in 4 months   SUMMARY OF ONCOLOGIC  HISTORY: Oncology History Overview Note   Cancer Staging  Primary adenocarcinoma of upper lobe of left lung ([place]) Staging form: Lung, AJCC 8th Edition - Clinical stage from 04/24/2021: Stage IVA (cT2, cN2, pM1b) - Signed by Truitt Merle, MD on 05/14/2021    Primary adenocarcinoma of upper lobe of left lung ([place])  04/22/2021 Imaging   CT HEAD WO CONTRAST   IMPRESSION: Two separate areas of vasogenic edema within the left hemisphere, 1 within the inferior frontal region in the other at the left parietal vertex. Metastatic disease would be the most likely cause of this appearance. Other possibilities include venous infarctions and cerebritis. MRI with contrast recommended.    04/23/2021 Imaging   MR Brain W and Wo Contrast  IMPRESSION: 1. Metastatic disease to the brain. Two enhancing brain masses with moderate associated vasogenic edema: solid 16 mm left parietal lobe mass, and a larger 29 mm rim enhancing cystic or necrotic mass in the left inferior frontal gyrus. Two other small 2-3 mm metastases in the right inferior frontal gyrus, right posterior temporal lobe. And questionable two additional punctate metastases in both superior frontal gyri abutting the falx (series 18, image 46). 2. No significant intracranial mass effect. No other intracranial abnormality.   04/23/2021 Imaging   CT Chest W Contrast  IMPRESSION: 4.5 cm spiculated left upper lobe mass most compatible with primary lung cancer. This abuts the medial pleural surface. A few small adjacent pre-vascular mediastinal lymph nodes, none pathologically enlarged. Recommend cardiothoracic surgical and oncologic consultation.   No acute findings or evidence of metastatic disease in the abdomen or pelvis.   04/24/2021 Cancer Staging  Staging form: Lung, AJCC 8th Edition - Clinical stage from 04/24/2021: Stage IVA (cT2, cN2, pM1b) - Signed by Truitt Merle, MD on 05/14/2021   04/24/2021 Initial Biopsy   FINAL MICROSCOPIC DIAGNOSIS:    A. LUNG, LUL, FINE NEEDLE ASPIRATION:  - Malignant cells present, consistent with adenocarcinoma   B. LUNG, LUL, BRUSHING:  - Malignant cells consistent with adenocarcinoma, see comment    COMMENT:  B.  Immunohistochemical stains show that the tumor cells are positive for TTF-1 while they are negative for p63 and CK5/6, consistent with above interpretation. The number of tumor cells appears to be insufficient for molecular studies.   05/03/2021 Initial Diagnosis   Lung cancer ([place])   05/08/2021 - 05/13/2021 Radiation Therapy   SRS treatment to the metastatic brain lesions under the care of Dr. Tammi Klippel    08/05/2021 Imaging   EXAM: CT CHEST, ABDOMEN, AND PELVIS WITH CONTRAST  IMPRESSION: 1. No substantial change in size of the left upper lobe pulmonary lesion. 2. Borderline to mildly enlarged adjacent prevascular/AP window lymph nodes have increased in size in the interval. This finding raises concern for disease progression. 3. Interval development of upper normal bilateral common iliac and left external iliac lymph nodes. Close attention on follow-up recommended. 4. No other evidence for metastatic disease in the abdomen or pelvis.      INTERVAL HISTORY:  Abigail Golden is here for a follow up of metastatic lung cancer   She was last seen by me on 04/15/2022 She presents to the clinic accompanied by a family friend. Pt states she has a dry cough .She has a good appetite and has been sleeping well. Bm can be loose and then back normal.     All other systems were reviewed with the patient and are negative.  MEDICAL HISTORY:  Past Medical History:  Diagnosis Date   Allergy    Anxiety    Chronic bronchitis ([place])    Depression    Headache    Hypoglycemia    Lower back pain    since fall ~ 2012 (05/25/2015)   Migraine    05/25/2015 "probably once/month"   Migraine headache    Pain in left hip    since fall ~ 2012 (05/25/2015)   Pneumonia "several times"     SURGICAL HISTORY: Past Surgical History:  Procedure Laterality Date   ABDOMINAL HYSTERECTOMY  11/2002   "found benign tumors"   BRONCHIAL BIOPSY  04/24/2021   Procedure: BRONCHIAL BIOPSIES;  Surgeon: Garner Nash, DO;  Location: [facility];  Service: Pulmonary;;   BRONCHIAL BRUSHINGS  04/24/2021   Procedure: BRONCHIAL BRUSHINGS;  Surgeon: Garner Nash, DO;  Location: [facility];  Service: Pulmonary;;   BRONCHIAL NEEDLE ASPIRATION BIOPSY  04/24/2021   Procedure: BRONCHIAL NEEDLE ASPIRATION BIOPSIES;  Surgeon: Garner Nash, DO;  Location: [facility];  Service: Pulmonary;;   CHEST TUBE INSERTION  04/24/2021   Procedure: CHEST TUBE INSERTION;  Surgeon: Garner Nash, DO;  Location: [facility];  Service: Pulmonary;;   CHOLECYSTECTOMY N/A 05/25/2015   Procedure: LAPAROSCOPIC CHOLECYSTECTOMY;  Surgeon: Ralene Ok, MD;  Location: [facility];  Service: General;  Laterality: N/A;   LAPAROSCOPIC CHOLECYSTECTOMY  05/25/2015   MENISCUS REPAIR Right 03/2017   VIDEO BRONCHOSCOPY WITH ENDOBRONCHIAL NAVIGATION N/A 04/24/2021   Procedure: VIDEO BRONCHOSCOPY WITH ENDOBRONCHIAL NAVIGATION;  Surgeon: Garner Nash, DO;  Location: [facility];  Service: Pulmonary;  Laterality: N/A;   VIDEO BRONCHOSCOPY WITH RADIAL ENDOBRONCHIAL ULTRASOUND  04/24/2021   Procedure: VIDEO BRONCHOSCOPY  WITH RADIAL ENDOBRONCHIAL ULTRASOUND;  Surgeon: Garner Nash, DO;  Location: [facility];  Service: Pulmonary;;    I have reviewed the social history and family history with the patient and they are unchanged from previous note.  ALLERGIES:  is allergic to celebrex [celecoxib] and minocycline.  MEDICATIONS:  Current Outpatient Medications  Medication Sig Dispense Refill   acetaminophen (TYLENOL) 500 MG tablet Take 500 mg by mouth every 6 (six) hours as needed for moderate pain or headache.     albuterol (VENTOLIN HFA) 108 (90 Base) MCG/ACT inhaler Inhale 2 puffs into the lungs every 4 (four) hours as  needed.     ALPRAZolam (XANAX) 0.25 MG tablet Take 0.25 mg by mouth every 4 (four) hours as needed for anxiety. for anxiety     aspirin EC 81 MG tablet Take 81 mg by mouth daily. Swallow whole.     citalopram (CELEXA) 10 MG tablet Take 10 mg by mouth at bedtime.     clindamycin (CLEOCIN T) 1 % external solution Apply topically 2 (two) times daily. 30 mL 0   clindamycin-benzoyl peroxide (BENZACLIN) gel Apply to face 2 times a day 25 g 1   diclofenac sodium (VOLTAREN) 1 % GEL Apply topically as needed. To affected area     diphenhydrAMINE (BENADRYL) 25 MG tablet Take 25 mg by mouth See admin instructions. 25 mg at bedtime 25 mg as needed for itching     Docusate Sodium (STOOL SOFTENER LAXATIVE PO) Take 1 tablet by mouth as needed.     eletriptan (RELPAX) 20 MG tablet Take 20 mg by mouth as needed for headache (at onset of migraine.). May repeat in 2 hours if needed.     fluconazole (DIFLUCAN) 200 MG tablet Take 1 tablet (200 mg total) by mouth daily. 14 tablet 0   fluticasone (FLONASE) 50 MCG/ACT nasal spray Place 2 sprays into both nostrils daily as needed for allergies.     HYDROcodone-acetaminophen (HYCET) 7.5-325 mg/15 ml solution Take 10 mLs by mouth every 4 (four) hours as needed for moderate pain. 473 mL 0   hydrOXYzine (ATARAX/VISTARIL) 25 MG tablet Take 25 mg by mouth See admin instructions. 25 mg at bedtime 25 mg during the day as needed for itching/anxiety     loratadine (CLARITIN) 10 MG tablet Take 10 mg by mouth daily.     magic mouthwash (nystatin, lidocaine, diphenhydrAMINE, alum & mag hydroxide) suspension Swish and swallow 5 mls by mouth every 6 hours as needed 250 mL 1   magic mouthwash w/lidocaine SOLN Take 5 mLs by mouth every 6 hours as needed for mouth pain. Swish and Swallow (Patient not taking: Reported on 08/19/2021) 250 mL 1   montelukast (SINGULAIR) 10 MG tablet Take 10 mg by mouth at bedtime.     nystatin (MYCOSTATIN/NYSTOP) powder Apply 1 application topically 3 (three)  times daily. 60 g 1   ondansetron (ZOFRAN-ODT) 4 MG disintegrating tablet DISSOLVE 1 TABLET IN MOUTH EVERY 8 HOURS AS NEEDED FOR NAUSEA AND VOMITING 18 tablet 1   osimertinib mesylate (TAGRISSO) 80 MG tablet Take 1 tablet (80 mg total) by mouth daily. 30 tablet 2   predniSONE (DELTASONE) 5 MG tablet Take 1 tablet (5 mg total) by mouth daily with breakfast. Take 2 tabs daily for 7 days then 1 tab daily 40 tablet 0   prochlorperazine (COMPAZINE) 10 MG tablet TAKE 1 TABLET BY MOUTH EVERY 6 HOURS AS NEEDED FOR NAUSEA FOR VOMITING 30 tablet 1   solifenacin (VESICARE) 10 MG tablet  Take 10 mg by mouth every evening.     sucralfate (CARAFATE) 1 g tablet Take 1 tablet (1 g total) by mouth 4 (four) times daily -  with meals and at bedtime. 5 min before meals for radiation induced esophagitis 120 tablet 2   zolpidem (AMBIEN) 10 MG tablet Take 1 tablet (10 mg total) by mouth at bedtime as needed for sleep. 30 tablet 0   No current facility-administered medications for this visit.    PHYSICAL EXAMINATION: ECOG PERFORMANCE STATUS: 1 - Symptomatic but completely ambulatory  Vitals:   05/26/22 1452  BP: (!) 140/66  Pulse: 78  Resp: 17  Temp: 98.2 F (36.8 C)  SpO2: 100%   Wt Readings from Last 3 Encounters:  05/26/22 243 lb 3.2 oz (110.3 kg)  04/15/22 243 lb 12.8 oz (110.6 kg)  01/14/22 240 lb 9.6 oz (109.1 kg)     GENERAL:alert, no distress and comfortable SKIN: skin color normal, no rashes or significant lesions EYES: normal, Conjunctiva are pink and non-injected, sclera clear  NEURO: alert & oriented x 3 with fluent speech {LABORATORY DATA:  I have reviewed the data as listed    Latest Ref Rng & Units 05/21/2022   12:05 PM 04/15/2022   12:42 PM 02/10/2022    2:55 PM  CBC  WBC 4.0 - 10.5 K/uL 3.4  3.5  2.4   Hemoglobin 12.0 - 15.0 g/dL 10.8  11.1  11.1   Hematocrit 36.0 - 46.0 % 32.1  33.1  33.1   Platelets 150 - 400 K/uL 168  151  153         Latest Ref Rng & Units 05/21/2022    12:05 PM 04/15/2022   12:42 PM 02/10/2022    2:55 PM  CMP  Glucose 70 - 99 mg/dL 98  88  97   BUN 6 - 20 mg/dL _0 Creatinine 0.44 - 1.00 mg/dL 1.20  1.30  1.25   Sodium 135 - 145 mmol/L 135  137  136   Potassium 3.5 - 5.1 mmol/L 4.4  4.2  4.0   Chloride 98 - 111 mmol/L 103  104  105   CO2 22 - 32 mmol/L _1 Calcium 8.9 - 10.3 mg/dL 9.6  9.4  9.5   Total Protein 6.5 - 8.1 g/dL 7.6  7.2  7.0   Total Bilirubin 0.3 - 1.2 mg/dL 0.5  0.4  0.5   Alkaline Phos 38 - 126 U/L 84  74  66   AST 15 - 41 U/L _2 ALT 0 - 44 U/L _3 RADIOGRAPHIC STUDIES: I have personally reviewed the radiological images as listed and agreed with the findings in the report. No results found.    No orders of the defined types were placed in this encounter.  All questions were answered. The patient knows to call the clinic with any problems, questions or concerns. No barriers to learning was detected. The total time spent in the appointment was 30 minutes.     Truitt Merle, MD 05/26/2022   Felicity Coyer, CMA, am acting as scribe for Truitt Merle, MD.   I have reviewed the above documentation for accuracy and completeness, and I agree with the above.

## 2022-05-26 NOTE — Assessment & Plan Note (Addendum)
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 She tolerates moderately well with mucositis and skin toxicities.   -she had thoracic radiation (The primary tumor and involved mediastinal adenopathy were treated to 66 Gy in 33 fractions of 2 Gy), completed on 11/04/2021 -I personally reviewed her recent restaging CT scan from May 22, 2022, which showed continued response in the primary site and thoracic adenopathy, or new lesions. -She is clinically doing better, lives independently, tolerating Taxol well, will continue.

## 2022-05-26 NOTE — Assessment & Plan Note (Addendum)
-  s/p SRS under Dr. Tammi Klippel 05/08/21 - 05/13/21 (3 fractions), 08/27/21, and 12/10/21. -recent brain MRI on 03/15/2022 showed great response, no new lesions -brain SBRT again in 11/2021

## 2022-05-27 ENCOUNTER — Telehealth: Payer: Self-pay

## 2022-05-27 NOTE — Telephone Encounter (Signed)
Pt called to see if Starla Link, RN in Disability/FMLA had faxed her FMLA/Disability extension paperwork in for her job.  Informed pt that Starla Link is out of the office today but this RN will check with out FMLA/Disability Team to see if they can confirm if the paperwork has been faxed.  Routed message to FMLA/Disability Team.

## 2022-05-29 ENCOUNTER — Other Ambulatory Visit: Payer: Self-pay | Admitting: Hematology

## 2022-05-29 NOTE — Telephone Encounter (Signed)
Yes her paperwork was faxed on 05/12/22 and I spoke with her about it last week.

## 2022-06-06 ENCOUNTER — Other Ambulatory Visit: Payer: Self-pay

## 2022-06-06 ENCOUNTER — Other Ambulatory Visit (HOSPITAL_COMMUNITY): Payer: Self-pay

## 2022-06-11 ENCOUNTER — Other Ambulatory Visit: Payer: Self-pay

## 2022-06-12 ENCOUNTER — Other Ambulatory Visit (HOSPITAL_COMMUNITY): Payer: Self-pay

## 2022-06-12 ENCOUNTER — Ambulatory Visit (HOSPITAL_COMMUNITY)
Admission: RE | Admit: 2022-06-12 | Discharge: 2022-06-12 | Disposition: A | Discharge status: Home / Self Care | Payer: BC Managed Care – PPO | Source: Ambulatory Visit | Attending: Radiation Oncology | Admitting: Radiation Oncology

## 2022-06-12 DIAGNOSIS — C7931 Secondary malignant neoplasm of brain: Secondary | ICD-10-CM | POA: Diagnosis present

## 2022-06-12 MED ORDER — GADOBUTROL 1 MMOL/ML IV SOLN
10.0000 mL | Freq: Once | INTRAVENOUS | Status: AC | PRN
  Administered 2022-06-12: 10 mL via INTRAVENOUS

## 2022-06-17 ENCOUNTER — Other Ambulatory Visit (HOSPITAL_COMMUNITY): Payer: Self-pay

## 2022-06-18 ENCOUNTER — Other Ambulatory Visit (HOSPITAL_COMMUNITY): Payer: Self-pay

## 2022-06-18 ENCOUNTER — Ambulatory Visit
Admission: RE | Admit: 2022-06-18 | Discharge: 2022-06-18 | Disposition: A | Discharge status: Home / Self Care | Payer: BC Managed Care – PPO | Source: Ambulatory Visit | Attending: Urology | Admitting: Urology

## 2022-06-18 ENCOUNTER — Encounter: Payer: Self-pay | Admitting: Urology

## 2022-06-18 DIAGNOSIS — C7931 Secondary malignant neoplasm of brain: Secondary | ICD-10-CM

## 2022-06-18 NOTE — Progress Notes (Signed)
Telephone nursing appointment for patient to receive most recent scan results from 06/16/22. I verified patient's identity and began nursing interview. Patient reports occasional headaches 3/10.   Meaningful use complete.   Patient aware of their 8:30am-06/18/22 telephone appointment w/ Ashlyn Bruning PA-C. I left my extension [phone] in case patient needs anything. Patient verbalized understanding. This concludes the nursing interview.   Patient contact [phone]     Leandra Kern, LPN

## 2022-06-18 NOTE — Progress Notes (Signed)
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  Interval Since Last Radiation:  6 month s/p SRS brain; 7 months s/p IMRT lung  12/10/21: Single fraction SRS brain PTV9   09/19/21 - 11/04/21: The primary tumor in the LUL lung and involved mediastinal adenopathy were treated to 66 Gy in 33 fractions of 2 Gy.  08/27/21: Single fraction SRS Brain PTV7-8   05/08/21 - 05/13/21: Fractionated SRS Brain PTV1 - PTV6     Narrative:  I spoke with the patient to conduct her routine scheduled 3 month follow up visit to review results of her recent MRI brain scan via telephone to spare the patient unnecessary potential exposure in the healthcare setting during the current COVID-19 pandemic.  The patient was notified in advance and gave permission to proceed with this visit format.  She has recovered well from the effects of her SRS treatments. Her most recent post-treatment MRI brain scan from 06/12/22 shows an excellent response to treatment with the previously identified enhancing lesions in the left middle frontal gyrus no longer visible and previously treated lesions in the inferior left frontal lobe and high posterior left parietal lobe are stable in size.  There are no new lesions present to suggest progressive disease.  We reviewed these results today.  On review of systems, the patient states that she is doing well in general.  She reports that the esophagitis and skin irritation that she experienced with her chest radiation are fully resolved.  She denies any changes in visual or auditory acuity, difficulty with speech, word finding, tremors or seizure activity. She has had a few  intermittent migraine headaches, with associated nausea, dizziness/imbalance, and sensitivity to light that have responded quickly to her migraine medication. Currently, she is without complaints aside from her usual mild fatigue in the afternoons but overall, is pleased with her progress to date. She continues to tolerate the Tagrisso fairly well.     ALLERGIES:  is allergic to celebrex [celecoxib] and minocycline.  Meds: Current Outpatient Medications  Medication Sig Dispense Refill   acetaminophen (TYLENOL) 500 MG tablet Take 500 mg by mouth every 6 (six) hours as needed for moderate pain or headache.     albuterol (VENTOLIN HFA) 108 (90 Base) MCG/ACT inhaler Inhale 2 puffs into the lungs every 4 (four) hours as needed.     ALPRAZolam (XANAX) 0.25 MG tablet Take 0.25 mg by mouth every 4 (four) hours as needed for anxiety. for anxiety     aspirin EC 81 MG tablet Take 81 mg by mouth daily. Swallow whole.     citalopram (CELEXA) 10 MG tablet Take 10 mg by mouth at bedtime.     clindamycin (CLEOCIN T) 1 % external solution Apply topically 2 (two) times daily. 30 mL 0   clindamycin-benzoyl peroxide (BENZACLIN) gel Apply to face 2 times a day 25 g 1   diclofenac sodium (VOLTAREN) 1 % GEL Apply topically as needed. To affected area     diphenhydrAMINE (BENADRYL) 25 MG tablet Take 25 mg by mouth See admin instructions. 25 mg at bedtime 25 mg as needed for itching     Docusate Sodium (STOOL SOFTENER LAXATIVE  PO) Take 1 tablet by mouth as needed.     eletriptan (RELPAX) 20 MG tablet Take 20 mg by mouth as needed for headache (at onset of migraine.). May repeat in 2 hours if needed.     fluconazole (DIFLUCAN) 200 MG tablet Take 1 tablet (200 mg total) by mouth daily. 14 tablet 0   fluticasone (FLONASE) 50 MCG/ACT nasal spray Place 2 sprays into both nostrils daily as needed for allergies.     HYDROcodone-acetaminophen (HYCET) 7.5-325 mg/15 ml solution Take 10 mLs by mouth every 4 (four) hours as needed  for moderate pain. 473 mL 0   hydrOXYzine (ATARAX/VISTARIL) 25 MG tablet Take 25 mg by mouth See admin instructions. 25 mg at bedtime 25 mg during the day as needed for itching/anxiety     loratadine (CLARITIN) 10 MG tablet Take 10 mg by mouth daily.     magic mouthwash (nystatin, lidocaine, diphenhydrAMINE, alum & mag hydroxide) suspension Swish and swallow 5 mls by mouth every 6 hours as needed 250 mL 1   magic mouthwash w/lidocaine SOLN Take 5 mLs by mouth every 6 hours as needed for mouth pain. Swish and Swallow (Patient not taking: Reported on 08/19/2021) 250 mL 1   montelukast (SINGULAIR) 10 MG tablet Take 10 mg by mouth at bedtime.     NYSTATIN powder APPLY  POWDER TOPICALLY TO AFFECTED AREA THREE TIMES DAILY 60 g 0   ondansetron (ZOFRAN-ODT) 4 MG disintegrating tablet DISSOLVE 1 TABLET IN MOUTH EVERY 8 HOURS AS NEEDED FOR NAUSEA AND VOMITING 18 tablet 1   osimertinib mesylate (TAGRISSO) 80 MG tablet Take 1 tablet (80 mg total) by mouth daily. 30 tablet 2   predniSONE (DELTASONE) 5 MG tablet Take 1 tablet (5 mg total) by mouth daily with breakfast. Take 2 tabs daily for 7 days then 1 tab daily 40 tablet 0   prochlorperazine (COMPAZINE) 10 MG tablet TAKE 1 TABLET BY MOUTH EVERY 6 HOURS AS NEEDED FOR NAUSEA FOR VOMITING 30 tablet 1   solifenacin (VESICARE) 10 MG tablet Take 10 mg by mouth every evening.     sucralfate (CARAFATE) 1 g tablet Take 1 tablet (1 g total) by mouth 4 (four) times daily -  with meals and at bedtime. 5 min before meals for radiation induced esophagitis 120 tablet 2   zolpidem (AMBIEN) 10 MG tablet Take 1 tablet (10 mg total) by mouth at bedtime as needed for sleep. 30 tablet 0   No current facility-administered medications for this encounter.    Physical Findings:  vitals were not taken for this visit.  Pain Assessment Pain Score: 3  (headaches)/10 Unable to assess due to telephone follow-up visit format.  Lab Findings: Lab Results  Component Value Date   WBC  3.4 (L) 05/21/2022   HGB 10.8 (L) 05/21/2022   HCT 32.1 (L) 05/21/2022   MCV 84.3 05/21/2022   PLT 168 05/21/2022     Radiographic Findings: MR Brain W Wo Contrast  Result Date: 06/16/2022 CLINICAL DATA:  Brain metastases, assess treatment response 3T SRS Protocol - Follow-up of treated brain disease EXAM: MRI HEAD WITHOUT AND WITH CONTRAST TECHNIQUE: Multiplanar, multiecho pulse sequences of the brain and surrounding structures were obtained without and with intravenous contrast. CONTRAST:  20m GADAVIST GADOBUTROL 1 MMOL/ML IV SOLN COMPARISON:  MRI head 03/14/2022. FINDINGS: Brain: Similar size/appearance of an enhancing lesion in the inferior left frontal lobe and left parietal lobe (series 1100, images 157 and 233). Some scattered areas of linear enhancement along the sulci  are favored to represent vessels. No definite new intraparenchymal lesion. No evidence of acute infarct, acute hemorrhage, midline shift, or hydrocephalus. Vascular: Major arterial flow voids are maintained at the skull base. Skull and upper cervical spine: Normal marrow signal. Sinuses/Orbits: Clear sinuses.  No acute orbital findings. Other: Trace mastoid effusions. IMPRESSION: Similar size/appearance of the left frontal and left parietal lesions. No new lesions identified. Electronically Signed   By: Margaretha Sheffield M.D.   On: 06/16/2022 11:36   CT CHEST ABDOMEN PELVIS W CONTRAST  Result Date: 05/22/2022 CLINICAL DATA:  Non-small-cell lung cancer, metastatic. Adenocarcinoma of left upper lobe. Evaluate treatment response. * Tracking Code: BO * EXAM: CT CHEST, ABDOMEN, AND PELVIS WITH CONTRAST TECHNIQUE: Multidetector CT imaging of the chest, abdomen and pelvis was performed following the standard protocol during bolus administration of intravenous contrast. RADIATION DOSE REDUCTION: This exam was performed according to the departmental dose-optimization program which includes automated exposure control, adjustment of the  mA and/or kV according to patient size and/or use of iterative reconstruction technique. CONTRAST:  13m OMNIPAQUE IOHEXOL 300 MG/ML  SOLN COMPARISON:  02/10/2022 FINDINGS: CT CHEST FINDINGS Cardiovascular: Aortic atherosclerosis. Normal heart size, without pericardial effusion. No central pulmonary embolism, on this non-dedicated study. Mediastinum/Nodes: The previously measured diminutive left supraclavicular node is further decreased at 3 mm on 06/02 versus 5 mm on the prior. No mediastinal or hilar adenopathy. The index prevascular node on the prior exam has resolved. Lungs/Pleura: No pleural fluid.  Mild centrilobular emphysema. Evolution of radiation change within the posterior left upper lobe and adjacent superior segment left lower lobe. Slightly more confluence consolidation for example on 38/4 with decreased air bronchograms. No convincing evidence of local recurrent mass. Musculoskeletal: No acute osseous abnormality. CT ABDOMEN PELVIS FINDINGS Hepatobiliary: Normal liver. Cholecystectomy, without biliary ductal dilatation. Pancreas: Normal, without mass or ductal dilatation. Spleen: Borderline splenomegaly at 13.4 cm. Adrenals/Urinary Tract: Normal adrenal glands. Normal kidneys, without hydronephrosis. Normal urinary bladder. Stomach/Bowel: Normal stomach, without wall thickening. Normal colon and terminal ileum. Normal small bowel. Vascular/Lymphatic: Normal caliber of the aorta and branch vessels. Retroaortic left renal vein. No abdominopelvic adenopathy. Reproductive: Hysterectomy.  No adnexal mass. Other: No significant free fluid. Mild pelvic floor laxity. No evidence of omental or peritoneal disease. Musculoskeletal: Degenerative disc disease at L3-4 IMPRESSION: 1. Further decrease in size of left supraclavicular node and resolution of prevascular node. No evidence of residual metastatic disease. 2. Somewhat more confluent consolidation at the site of radiation in the left upper lobe, likely  evolution of radiation change. No convincing evidence of local recurrence. Recommend attention on follow-up. 3. Aortic atherosclerosis (ICD10-I70.0) and emphysema (ICD10-J43.9). Electronically Signed   By: KAbigail MiyamotoM.D.   On: 05/22/2022 08:39    Impression/Plan: 1. 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 She appears to have recovered well from the effects of her recent SRS brain treatment and is currently without complaints.  Her recent posttreatment MRI brain scan from 06/12/2022 shows an excellent response to treatment with a stable appearance of the 2 most recently treated lesions and no evidence of disease recurrence or progression.  We discussed the plan to continue with serial MRI brain scans every 3 months going forward, to continue to monitor for any evidence of disease recurrence or progression. I will call her to review the results and any recommendations from the multidisciplinary brain conference following each scan. She will also continue in routine follow  up with Dr. Burr Medico for continued management of her systemic disease. She continues to tolerate the Tagrisso fairly well and her most recent disease restaging imaging from 05/21/2022 continues to show excellent response to treatment with decreased size of left supraclavicular and mediastinal lymph nodes and evolution of radiation changes in the LUL lung. She will have repeat disease restaging imaging in 09/2022 prior to a follow up visit with Dr. Burr Medico.  She appears to have a good understanding of our recommendations and is comfortable and in agreement with the stated plan. I look forward to connecting with her by telephone in March 2024, to review MRI results but she knows that she is welcome to call with any questions or concerns at any time in the interim.  I personally spent 20 minutes in this encounter including chart review, reviewing radiological  studies, telephone conversation with the patient, entering orders coordinating her care and completing documentation.     Nicholos Johns, PA-C

## 2022-06-19 ENCOUNTER — Other Ambulatory Visit: Payer: Self-pay | Admitting: Family Medicine

## 2022-06-19 ENCOUNTER — Other Ambulatory Visit (HOSPITAL_COMMUNITY): Payer: Self-pay

## 2022-06-19 DIAGNOSIS — Z1231 Encounter for screening mammogram for malignant neoplasm of breast: Secondary | ICD-10-CM

## 2022-07-08 ENCOUNTER — Other Ambulatory Visit (HOSPITAL_COMMUNITY): Payer: Self-pay

## 2022-07-09 ENCOUNTER — Other Ambulatory Visit (HOSPITAL_COMMUNITY): Payer: Self-pay

## 2022-07-10 ENCOUNTER — Other Ambulatory Visit (HOSPITAL_COMMUNITY): Payer: Self-pay

## 2022-07-11 ENCOUNTER — Other Ambulatory Visit (HOSPITAL_COMMUNITY): Payer: Self-pay

## 2022-07-15 ENCOUNTER — Other Ambulatory Visit: Payer: Self-pay

## 2022-07-17 ENCOUNTER — Other Ambulatory Visit: Payer: Self-pay | Admitting: Radiation Therapy

## 2022-07-18 ENCOUNTER — Other Ambulatory Visit (HOSPITAL_COMMUNITY): Payer: Self-pay

## 2022-07-21 ENCOUNTER — Other Ambulatory Visit: Payer: Self-pay | Admitting: Radiation Therapy

## 2022-07-21 ENCOUNTER — Other Ambulatory Visit: Payer: Self-pay

## 2022-07-21 ENCOUNTER — Other Ambulatory Visit (HOSPITAL_COMMUNITY): Payer: Self-pay

## 2022-07-21 DIAGNOSIS — C7931 Secondary malignant neoplasm of brain: Secondary | ICD-10-CM

## 2022-07-27 NOTE — Assessment & Plan Note (Addendum)
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 She tolerates moderately well with mucositis and skin toxicities.   -she had thoracic radiation (The primary tumor and involved mediastinal adenopathy were treated to 66 Gy in 33 fractions of 2 Gy), completed on 11/04/2021 -I personally reviewed her recent restaging CT scan from May 22, 2022, which showed continued response in the primary site and thoracic adenopathy, or new lesions. -She is clinically doing better, lives independently, tolerating Tagrisso well, will continue. -Restaging brain MRI from December 21st 2023 showed a stable brain lesions.

## 2022-07-28 ENCOUNTER — Other Ambulatory Visit (HOSPITAL_COMMUNITY): Payer: Self-pay

## 2022-07-28 ENCOUNTER — Other Ambulatory Visit: Payer: Self-pay

## 2022-07-28 ENCOUNTER — Encounter: Payer: Self-pay | Admitting: Hematology

## 2022-07-28 ENCOUNTER — Inpatient Hospital Stay: Payer: BC Managed Care – PPO | Attending: Hematology

## 2022-07-28 ENCOUNTER — Inpatient Hospital Stay (HOSPITAL_BASED_OUTPATIENT_CLINIC_OR_DEPARTMENT_OTHER): Payer: BC Managed Care – PPO | Admitting: Hematology

## 2022-07-28 VITALS — BP 129/61 | HR 79 | Temp 98.1°F | Resp 18 | Ht 60.0 in | Wt 225.1 lb

## 2022-07-28 DIAGNOSIS — R0602 Shortness of breath: Secondary | ICD-10-CM | POA: Insufficient documentation

## 2022-07-28 DIAGNOSIS — R21 Rash and other nonspecific skin eruption: Secondary | ICD-10-CM | POA: Diagnosis not present

## 2022-07-28 DIAGNOSIS — C3412 Malignant neoplasm of upper lobe, left bronchus or lung: Secondary | ICD-10-CM

## 2022-07-28 DIAGNOSIS — Z7982 Long term (current) use of aspirin: Secondary | ICD-10-CM | POA: Diagnosis not present

## 2022-07-28 DIAGNOSIS — Z923 Personal history of irradiation: Secondary | ICD-10-CM | POA: Insufficient documentation

## 2022-07-28 DIAGNOSIS — Z7952 Long term (current) use of systemic steroids: Secondary | ICD-10-CM | POA: Insufficient documentation

## 2022-07-28 DIAGNOSIS — C7931 Secondary malignant neoplasm of brain: Secondary | ICD-10-CM

## 2022-07-28 DIAGNOSIS — Z79899 Other long term (current) drug therapy: Secondary | ICD-10-CM | POA: Diagnosis not present

## 2022-07-28 LAB — CMP (CANCER CENTER ONLY)
ALT: 19 U/L (ref 0–44)
AST: 21 U/L (ref 15–41)
Albumin: 3.8 g/dL (ref 3.5–5.0)
Alkaline Phosphatase: 73 U/L (ref 38–126)
Anion gap: 6 (ref 5–15)
BUN: 21 mg/dL — ABNORMAL HIGH (ref 6–20)
CO2: 25 mmol/L (ref 22–32)
Calcium: 9.1 mg/dL (ref 8.9–10.3)
Chloride: 106 mmol/L (ref 98–111)
Creatinine: 1.18 mg/dL — ABNORMAL HIGH (ref 0.44–1.00)
GFR, Estimated: 55 mL/min — ABNORMAL LOW (ref >60)
Glucose, Bld: 105 mg/dL — ABNORMAL HIGH (ref 70–99)
Potassium: 4.4 mmol/L (ref 3.5–5.1)
Sodium: 137 mmol/L (ref 135–145)
Total Bilirubin: 0.4 mg/dL (ref 0.3–1.2)
Total Protein: 6.6 g/dL (ref 6.5–8.1)

## 2022-07-28 LAB — CBC WITH DIFFERENTIAL (CANCER CENTER ONLY)
Abs Immature Granulocytes: 0.01 10*3/uL (ref 0.00–0.07)
Basophils Absolute: 0 10*3/uL (ref 0.0–0.1)
Basophils Relative: 0 %
Eosinophils Absolute: 0 10*3/uL (ref 0.0–0.5)
Eosinophils Relative: 1 %
HCT: 32.1 % — ABNORMAL LOW (ref 36.0–46.0)
Hemoglobin: 10.9 g/dL — ABNORMAL LOW (ref 12.0–15.0)
Immature Granulocytes: 0 %
Lymphocytes Relative: 25 %
Lymphs Abs: 0.9 10*3/uL (ref 0.7–4.0)
MCH: 28.1 pg (ref 26.0–34.0)
MCHC: 34 g/dL (ref 30.0–36.0)
MCV: 82.7 fL (ref 80.0–100.0)
Monocytes Absolute: 0.4 10*3/uL (ref 0.1–1.0)
Monocytes Relative: 11 %
Neutro Abs: 2.2 10*3/uL (ref 1.7–7.7)
Neutrophils Relative %: 63 %
Platelet Count: 160 10*3/uL (ref 150–400)
RBC: 3.88 MIL/uL (ref 3.87–5.11)
RDW: 13.6 % (ref 11.5–15.5)
WBC Count: 3.5 10*3/uL — ABNORMAL LOW (ref 4.0–10.5)
nRBC: 0 % (ref 0.0–0.2)

## 2022-07-28 MED ORDER — OSIMERTINIB MESYLATE 80 MG PO TABS
80.0000 mg | ORAL_TABLET | Freq: Every day | ORAL | 1 refills | Status: DC
  Filled 2022-07-28 – 2022-08-06 (×2): qty 30, 30d supply, fill #0
  Filled 2022-09-08: qty 30, 30d supply, fill #1

## 2022-07-28 MED ORDER — ZOLPIDEM TARTRATE 10 MG PO TABS: 5.0000 mg | ORAL_TABLET | Freq: Every evening | ORAL | 0 refills | Status: DC | PRN

## 2022-07-28 NOTE — Progress Notes (Signed)
[place]   [phone] [phone]   Clinic Follow up Note   Patient Care Team: Aletha Halim., PA-C as PCP - General (Family Medicine) Truitt Merle, MD as Consulting Physician (Hematology and Oncology) Tyler Pita, MD as Consulting Physician (Radiation Oncology)  Date of Service:  07/28/2022  CHIEF COMPLAINT: f/u of  metastatic lung cancer    CURRENT THERAPY:  Tagrisso, 80 mg daily, started 05/22/21    ASSESSMENT:  Abigail Golden is a 57 y.o. 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 She tolerates moderately well with mucositis and skin toxicities.   -she had thoracic radiation (The primary tumor and involved mediastinal adenopathy were treated to 66 Gy in 33 fractions of 2 Gy), completed on 11/04/2021 -I personally reviewed her recent restaging CT scan from May 22, 2022, which showed continued response in the primary site and thoracic adenopathy, or new lesions. -She is clinically doing better, lives independently, tolerating Tagrisso well, will continue. -Restaging brain MRI from December 21st 2023 showed a stable brain lesions.  -She is clinically stable overall, no new concerns.  Will repeat at next CT scan in 2 months.     PLAN: -Lab reviewed - Order CT scan -will continue Tagrisso, refilled today  -lab,CT scan f/u in 2 months   SUMMARY OF ONCOLOGIC HISTORY: Oncology History Overview Note   Cancer Staging  Primary adenocarcinoma of upper lobe of left lung ([place]) Staging form: Lung, AJCC 8th Edition - Clinical stage from 04/24/2021: Stage IVA (cT2, cN2, pM1b) - Signed by Truitt Merle, MD on 05/14/2021    Primary adenocarcinoma of upper lobe of left lung ([place])  04/22/2021 Imaging   CT HEAD WO CONTRAST   IMPRESSION: Two separate areas of vasogenic edema within  the left hemisphere, 1 within the inferior frontal region in the other at the left parietal vertex. Metastatic disease would be the most likely cause of this appearance. Other possibilities include venous infarctions and cerebritis. MRI with contrast recommended.    04/23/2021 Imaging   MR Brain W and Wo Contrast  IMPRESSION: 1. Metastatic disease to the brain. Two enhancing brain masses with moderate associated vasogenic edema: solid 16 mm left parietal lobe mass, and a larger 29 mm rim enhancing cystic or necrotic mass in the left inferior frontal gyrus. Two other small 2-3 mm metastases in the right inferior frontal gyrus, right posterior temporal lobe. And questionable two additional punctate metastases in both superior frontal gyri abutting the falx (series 18, image 46). 2. No significant intracranial mass effect. No other intracranial abnormality.   04/23/2021 Imaging   CT Chest W Contrast  IMPRESSION: 4.5 cm spiculated left upper lobe mass most compatible with primary lung cancer. This abuts the medial pleural surface. A few small adjacent pre-vascular mediastinal lymph nodes, none pathologically enlarged. Recommend cardiothoracic surgical and oncologic consultation.   No acute findings or evidence of metastatic disease in the abdomen or pelvis.   04/24/2021 Cancer Staging   Staging form: Lung, AJCC 8th Edition - Clinical stage from 04/24/2021: Stage IVA (cT2, cN2, pM1b) - Signed by Truitt Merle, MD on 05/14/2021   04/24/2021 Initial Biopsy   FINAL MICROSCOPIC DIAGNOSIS:   A. LUNG, LUL, FINE NEEDLE ASPIRATION:  - Malignant cells present, consistent with adenocarcinoma   B. LUNG, LUL, BRUSHING:  - Malignant cells consistent with adenocarcinoma, see comment  COMMENT:  B.  Immunohistochemical stains show that the tumor cells are positive for TTF-1 while they are negative for p63 and CK5/6, consistent with above interpretation. The number of tumor cells appears to be insufficient for  molecular studies.   05/03/2021 Initial Diagnosis   Lung cancer ([place])   05/08/2021 - 05/13/2021 Radiation Therapy   SRS treatment to the metastatic brain lesions under the care of Dr. Tammi Klippel    08/05/2021 Imaging   EXAM: CT CHEST, ABDOMEN, AND PELVIS WITH CONTRAST  IMPRESSION: 1. No substantial change in size of the left upper lobe pulmonary lesion. 2. Borderline to mildly enlarged adjacent prevascular/AP window lymph nodes have increased in size in the interval. This finding raises concern for disease progression. 3. Interval development of upper normal bilateral common iliac and left external iliac lymph nodes. Close attention on follow-up recommended. 4. No other evidence for metastatic disease in the abdomen or pelvis.      INTERVAL HISTORY:  Abigail Golden is here for a follow up of   metastatic lung cancer  She was last seen by me on 05/26/2022 She presents to the clinic accompanied by family friend.Pt states she has long term disability and her SSI to help with finances. Pt states she still get SOB when she is mobile. Pt reports of feeling extremely fatigue. Pt appetite is good. Pt has rash from the chemo.     All other systems were reviewed with the patient and are negative.  MEDICAL HISTORY:  Past Medical History:  Diagnosis Date   Allergy    Anxiety    Chronic bronchitis ([place])    Depression    Headache    Hypoglycemia    Lower back pain    since fall ~ 2012 (05/25/2015)   Migraine    05/25/2015 "probably once/month"   Migraine headache    Pain in left hip    since fall ~ 2012 (05/25/2015)   Pneumonia "several times"    SURGICAL HISTORY: Past Surgical History:  Procedure Laterality Date   ABDOMINAL HYSTERECTOMY  11/2002   "found benign tumors"   BRONCHIAL BIOPSY  04/24/2021   Procedure: BRONCHIAL BIOPSIES;  Surgeon: Garner Nash, DO;  Location: [facility];  Service: Pulmonary;;   BRONCHIAL BRUSHINGS  04/24/2021   Procedure: BRONCHIAL BRUSHINGS;   Surgeon: Garner Nash, DO;  Location: [facility];  Service: Pulmonary;;   BRONCHIAL NEEDLE ASPIRATION BIOPSY  04/24/2021   Procedure: BRONCHIAL NEEDLE ASPIRATION BIOPSIES;  Surgeon: Garner Nash, DO;  Location: [facility];  Service: Pulmonary;;   CHEST TUBE INSERTION  04/24/2021   Procedure: CHEST TUBE INSERTION;  Surgeon: Garner Nash, DO;  Location: [facility];  Service: Pulmonary;;   CHOLECYSTECTOMY N/A 05/25/2015   Procedure: LAPAROSCOPIC CHOLECYSTECTOMY;  Surgeon: Ralene Ok, MD;  Location: [facility];  Service: General;  Laterality: N/A;   LAPAROSCOPIC CHOLECYSTECTOMY  05/25/2015   MENISCUS REPAIR Right 03/2017   VIDEO BRONCHOSCOPY WITH ENDOBRONCHIAL NAVIGATION N/A 04/24/2021   Procedure: VIDEO BRONCHOSCOPY WITH ENDOBRONCHIAL NAVIGATION;  Surgeon: Garner Nash, DO;  Location: [facility];  Service: Pulmonary;  Laterality: N/A;   VIDEO BRONCHOSCOPY WITH RADIAL ENDOBRONCHIAL ULTRASOUND  04/24/2021   Procedure: VIDEO BRONCHOSCOPY WITH RADIAL ENDOBRONCHIAL ULTRASOUND;  Surgeon: Garner Nash, DO;  Location: [facility];  Service: Pulmonary;;    I have reviewed the social history and family history with the patient and they are unchanged from previous note.  ALLERGIES:  is allergic to celebrex [celecoxib] and minocycline.  MEDICATIONS:  Current Outpatient  Medications  Medication Sig Dispense Refill   acetaminophen (TYLENOL) 500 MG tablet Take 500 mg by mouth every 6 (six) hours as needed for moderate pain or headache.     albuterol (VENTOLIN HFA) 108 (90 Base) MCG/ACT inhaler Inhale 2 puffs into the lungs every 4 (four) hours as needed.     ALPRAZolam (XANAX) 0.25 MG tablet Take 0.25 mg by mouth every 4 (four) hours as needed for anxiety. for anxiety     aspirin EC 81 MG tablet Take 81 mg by mouth daily. Swallow whole.     citalopram (CELEXA) 10 MG tablet Take 10 mg by mouth at bedtime.     clindamycin (CLEOCIN T) 1 % external solution Apply topically 2 (two) times  daily. 30 mL 0   clindamycin-benzoyl peroxide (BENZACLIN) gel Apply to face 2 times a day 25 g 1   diclofenac sodium (VOLTAREN) 1 % GEL Apply topically as needed. To affected area     diphenhydrAMINE (BENADRYL) 25 MG tablet Take 25 mg by mouth See admin instructions. 25 mg at bedtime 25 mg as needed for itching     Docusate Sodium (STOOL SOFTENER LAXATIVE PO) Take 1 tablet by mouth as needed.     eletriptan (RELPAX) 20 MG tablet Take 20 mg by mouth as needed for headache (at onset of migraine.). May repeat in 2 hours if needed.     fluconazole (DIFLUCAN) 200 MG tablet Take 1 tablet (200 mg total) by mouth daily. 14 tablet 0   fluticasone (FLONASE) 50 MCG/ACT nasal spray Place 2 sprays into both nostrils daily as needed for allergies.     HYDROcodone-acetaminophen (HYCET) 7.5-325 mg/15 ml solution Take 10 mLs by mouth every 4 (four) hours as needed for moderate pain. 473 mL 0   hydrOXYzine (ATARAX/VISTARIL) 25 MG tablet Take 25 mg by mouth See admin instructions. 25 mg at bedtime 25 mg during the day as needed for itching/anxiety     loratadine (CLARITIN) 10 MG tablet Take 10 mg by mouth daily.     magic mouthwash (nystatin, lidocaine, diphenhydrAMINE, alum & mag hydroxide) suspension Swish and swallow 5 mls by mouth every 6 hours as needed 250 mL 1   magic mouthwash w/lidocaine SOLN Take 5 mLs by mouth every 6 hours as needed for mouth pain. Swish and Swallow (Patient not taking: Reported on 08/19/2021) 250 mL 1   montelukast (SINGULAIR) 10 MG tablet Take 10 mg by mouth at bedtime.     NYSTATIN powder APPLY  POWDER TOPICALLY TO AFFECTED AREA THREE TIMES DAILY 60 g 0   ondansetron (ZOFRAN-ODT) 4 MG disintegrating tablet DISSOLVE 1 TABLET IN MOUTH EVERY 8 HOURS AS NEEDED FOR NAUSEA AND VOMITING 18 tablet 1   osimertinib mesylate (TAGRISSO) 80 MG tablet Take 1 tablet (80 mg total) by mouth daily. 30 tablet 1   predniSONE (DELTASONE) 5 MG tablet Take 1 tablet (5 mg total) by mouth daily with breakfast.  Take 2 tabs daily for 7 days then 1 tab daily 40 tablet 0   prochlorperazine (COMPAZINE) 10 MG tablet TAKE 1 TABLET BY MOUTH EVERY 6 HOURS AS NEEDED FOR NAUSEA FOR VOMITING 30 tablet 1   solifenacin (VESICARE) 10 MG tablet Take 10 mg by mouth every evening.     sucralfate (CARAFATE) 1 g tablet Take 1 tablet (1 g total) by mouth 4 (four) times daily -  with meals and at bedtime. 5 min before meals for radiation induced esophagitis 120 tablet 2   zolpidem (AMBIEN) 10 MG tablet  Take 0.5-1 tablets (5-10 mg total) by mouth at bedtime as needed for sleep. 30 tablet 0   No current facility-administered medications for this visit.    PHYSICAL EXAMINATION: ECOG PERFORMANCE STATUS: 2 - Symptomatic, <50% confined to bed  Vitals:   07/28/22 1443  BP: 129/61  Pulse: 79  Resp: 18  Temp: 98.1 F (36.7 C)  SpO2: 99%   Wt Readings from Last 3 Encounters:  07/28/22 225 lb 1.6 oz (102.1 kg)  05/26/22 243 lb 3.2 oz (110.3 kg)  04/15/22 243 lb 12.8 oz (110.6 kg)     GENERAL:alert, no distress and comfortable SKIN: skin color normal, no rashes or significant lesions EYES: normal, Conjunctiva are pink and non-injected, sclera clear  NEURO: alert & oriented x 3 with fluent speech LYMPH:  no palpable lymphadenopathy in the cervical, axillary  LUNGS: (-) clear to auscultation and percussion with normal breathing effort HEART:(-)  regular rate & rhythm and no murmurs and no lower extremity edema ABDOMEN:abdomen soft, non-tender and normal bowel sounds Musculoskeletal:no cyanosis of digits and no clubbing  NEURO: alert & oriented x 3 with fluent speech, no focal motor/sensory deficits  LABORATORY DATA:  I have reviewed the data as listed    Latest Ref Rng & Units 07/28/2022    1:57 PM 05/21/2022   12:05 PM 04/15/2022   12:42 PM  CBC  WBC 4.0 - 10.5 K/uL 3.5  3.4  3.5   Hemoglobin 12.0 - 15.0 g/dL 10.9  10.8  11.1   Hematocrit 36.0 - 46.0 % 32.1  32.1  33.1   Platelets 150 - 400 K/uL 160  168  151          Latest Ref Rng & Units 07/28/2022    1:57 PM 05/21/2022   12:05 PM 04/15/2022   12:42 PM  CMP  Glucose 70 - 99 mg/dL 105  98  88   BUN 6 - 20 mg/dL 21  20  21    Creatinine 0.44 - 1.00 mg/dL 1.18  1.20  1.30   Sodium 135 - 145 mmol/L 137  135  137   Potassium 3.5 - 5.1 mmol/L 4.4  4.4  4.2   Chloride 98 - 111 mmol/L 106  103  104   CO2 22 - 32 mmol/L 25  26  27    Calcium 8.9 - 10.3 mg/dL 9.1  9.6  9.4   Total Protein 6.5 - 8.1 g/dL 6.6  7.6  7.2   Total Bilirubin 0.3 - 1.2 mg/dL 0.4  0.5  0.4   Alkaline Phos 38 - 126 U/L 73  84  74   AST 15 - 41 U/L 21  18  20    ALT 0 - 44 U/L 19  16  19        RADIOGRAPHIC STUDIES: I have personally reviewed the radiological images as listed and agreed with the findings in the report. No results found.    Orders Placed This Encounter  Procedures   CT CHEST ABDOMEN PELVIS W CONTRAST    Hold on iv contrast if EGFR<50    Standing Status:   Future    Standing Expiration Date:   07/29/2023    Order Specific Question:   Is patient pregnant?    Answer:   No    Order Specific Question:   Preferred imaging location?    Answer:   [facility]    Order Specific Question:   Release to patient    Answer:   Immediate  Order Specific Question:   Is Oral Contrast requested for this exam?    Answer:   Yes, Per Radiology protocol   All questions were answered. The patient knows to call the clinic with any problems, questions or concerns. No barriers to learning was detected. The total time spent in the appointment was 30 minutes.     Truitt Merle, MD 07/28/2022   Felicity Coyer, CMA, am acting as scribe for Truitt Merle, MD.   I have reviewed the above documentation for accuracy and completeness, and I agree with the above.

## 2022-08-06 ENCOUNTER — Other Ambulatory Visit (HOSPITAL_COMMUNITY): Payer: Self-pay

## 2022-08-07 ENCOUNTER — Ambulatory Visit
Admission: RE | Admit: 2022-08-07 | Discharge: 2022-08-07 | Disposition: A | Discharge status: Home / Self Care | Payer: BC Managed Care – PPO | Source: Ambulatory Visit | Attending: Family Medicine | Admitting: Family Medicine

## 2022-08-07 DIAGNOSIS — Z1231 Encounter for screening mammogram for malignant neoplasm of breast: Secondary | ICD-10-CM

## 2022-08-12 ENCOUNTER — Other Ambulatory Visit (HOSPITAL_COMMUNITY): Payer: Self-pay

## 2022-09-08 ENCOUNTER — Other Ambulatory Visit (HOSPITAL_COMMUNITY): Payer: Self-pay

## 2022-09-08 ENCOUNTER — Other Ambulatory Visit: Payer: Self-pay

## 2022-09-09 ENCOUNTER — Other Ambulatory Visit: Payer: Self-pay

## 2022-09-10 ENCOUNTER — Other Ambulatory Visit: Payer: Self-pay

## 2022-09-11 ENCOUNTER — Ambulatory Visit (HOSPITAL_COMMUNITY)
Admission: RE | Admit: 2022-09-11 | Discharge: 2022-09-11 | Disposition: A | Discharge status: Home / Self Care | Payer: BC Managed Care – PPO | Source: Ambulatory Visit | Attending: Radiation Oncology | Admitting: Radiation Oncology

## 2022-09-11 DIAGNOSIS — C7931 Secondary malignant neoplasm of brain: Secondary | ICD-10-CM

## 2022-09-11 MED ORDER — GADOBUTROL 1 MMOL/ML IV SOLN
10.0000 mL | Freq: Once | INTRAVENOUS | Status: AC | PRN
  Administered 2022-09-11: 10 mL via INTRAVENOUS

## 2022-09-12 ENCOUNTER — Other Ambulatory Visit (HOSPITAL_COMMUNITY): Payer: Self-pay

## 2022-09-15 ENCOUNTER — Inpatient Hospital Stay: Payer: BC Managed Care – PPO

## 2022-09-16 NOTE — Progress Notes (Signed)
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  Interval Since Last Radiation:  9 month s/p SRS brain; 10 months s/p IMRT lung  12/10/21: Single fraction SRS brain PTV9   09/19/21 - 11/04/21: The primary tumor in the LUL lung and involved mediastinal adenopathy were treated to 66 Gy in 33 fractions of 2 Gy.  08/27/21: Single fraction SRS Brain PTV7-8   05/08/21 - 05/13/21: Fractionated SRS Brain PTV1 - PTV6     Narrative:  I spoke with the patient to conduct her routine scheduled 3 month follow up visit to review results of her recent MRI brain scan via telephone to spare the patient unnecessary potential exposure in the healthcare setting during the current COVID-19 pandemic.  The patient was notified in advance and gave permission to proceed with this visit format.  She has recovered well from the effects of her SRS treatments. Her most recent post-treatment MRI brain scan from 09/11/22 shows an excellent response to treatment with the previously identified enhancing lesions in the left middle frontal gyrus no longer visible and a stable appearance of the previously treated lesion in the high posterior left parietal lobe. There was some slight enlargement in the previously treated inferior left frontal lobe lesion, suspected to be treatment effect.  There are no new lesions present to suggest progressive disease.  We reviewed these results today.  On review of systems, the patient states that she is doing well in general.  She reports that the esophagitis and skin irritation that she experienced with her chest radiation are fully resolved.  She denies any changes in visual or  auditory acuity, difficulty with speech, word finding, tremors or seizure activity. She has had a few intermittent migraine headaches, with associated nausea, dizziness/imbalance, and sensitivity to light that have responded quickly to her migraine medication. Currently, she is without complaints aside from her usual mild fatigue in the afternoons but overall, is pleased with her progress to date. She continues to tolerate the Tagrisso fairly well.     ALLERGIES:  is allergic to celebrex [celecoxib] and minocycline.  Meds: Current Outpatient Medications  Medication Sig Dispense Refill   acetaminophen (TYLENOL) 500 MG tablet Take 500 mg by mouth every 6 (six) hours as needed for moderate pain or headache.     albuterol (VENTOLIN HFA) 108 (90 Base) MCG/ACT inhaler Inhale 2 puffs into the lungs every 4 (four) hours as needed.     ALPRAZolam (XANAX) 0.25 MG tablet Take 0.25 mg by mouth every 4 (four) hours as needed for anxiety. for anxiety     aspirin EC 81 MG tablet Take 81 mg by mouth daily. Swallow whole.     citalopram (CELEXA) 10 MG tablet Take 10 mg by mouth at bedtime.     clindamycin (CLEOCIN T) 1 % external solution Apply topically 2 (two) times daily. 30 mL 0   clindamycin-benzoyl peroxide (BENZACLIN) gel Apply to face 2 times a day 25 g 1   diclofenac sodium (VOLTAREN) 1 % GEL Apply topically as needed. To affected area     diphenhydrAMINE (BENADRYL) 25 MG tablet Take 25 mg by mouth See admin instructions. 25 mg at bedtime  25 mg as needed for itching     Docusate Sodium (STOOL SOFTENER LAXATIVE PO) Take 1 tablet by mouth as needed.     eletriptan (RELPAX) 20 MG tablet Take 20 mg by mouth as needed for headache (at onset of migraine.). May repeat in 2 hours if needed.     fluconazole (DIFLUCAN) 200 MG tablet Take 1 tablet (200 mg total) by mouth daily. 14 tablet 0   fluticasone (FLONASE) 50 MCG/ACT nasal spray Place 2 sprays into both nostrils daily as needed for allergies.      HYDROcodone-acetaminophen (HYCET) 7.5-325 mg/15 ml solution Take 10 mLs by mouth every 4 (four) hours as needed for moderate pain. 473 mL 0   hydrOXYzine (ATARAX/VISTARIL) 25 MG tablet Take 25 mg by mouth See admin instructions. 25 mg at bedtime 25 mg during the day as needed for itching/anxiety     loratadine (CLARITIN) 10 MG tablet Take 10 mg by mouth daily.     magic mouthwash (nystatin, lidocaine, diphenhydrAMINE, alum & mag hydroxide) suspension Swish and swallow 5 mls by mouth every 6 hours as needed 250 mL 1   magic mouthwash w/lidocaine SOLN Take 5 mLs by mouth every 6 hours as needed for mouth pain. Swish and Swallow (Patient not taking: Reported on 08/19/2021) 250 mL 1   montelukast (SINGULAIR) 10 MG tablet Take 10 mg by mouth at bedtime.     NYSTATIN powder APPLY  POWDER TOPICALLY TO AFFECTED AREA THREE TIMES DAILY 60 g 0   ondansetron (ZOFRAN-ODT) 4 MG disintegrating tablet DISSOLVE 1 TABLET IN MOUTH EVERY 8 HOURS AS NEEDED FOR NAUSEA AND VOMITING 18 tablet 1   osimertinib mesylate (TAGRISSO) 80 MG tablet Take 1 tablet (80 mg total) by mouth daily. 30 tablet 1   predniSONE (DELTASONE) 5 MG tablet Take 1 tablet (5 mg total) by mouth daily with breakfast. Take 2 tabs daily for 7 days then 1 tab daily 40 tablet 0   prochlorperazine (COMPAZINE) 10 MG tablet TAKE 1 TABLET BY MOUTH EVERY 6 HOURS AS NEEDED FOR NAUSEA FOR VOMITING 30 tablet 1   solifenacin (VESICARE) 10 MG tablet Take 10 mg by mouth every evening.     sucralfate (CARAFATE) 1 g tablet Take 1 tablet (1 g total) by mouth 4 (four) times daily -  with meals and at bedtime. 5 min before meals for radiation induced esophagitis 120 tablet 2   zolpidem (AMBIEN) 10 MG tablet Take 0.5-1 tablets (5-10 mg total) by mouth at bedtime as needed for sleep. 30 tablet 0   No current facility-administered medications for this visit.    Physical Findings:  vitals were not taken for this visit.   /10 Unable to assess due to telephone follow-up  visit format.  Lab Findings: Lab Results  Component Value Date   WBC 3.5 (L) 07/28/2022   HGB 10.9 (L) 07/28/2022   HCT 32.1 (L) 07/28/2022   MCV 82.7 07/28/2022   PLT 160 07/28/2022     Radiographic Findings: MR Brain W Wo Contrast  Result Date: 09/15/2022 CLINICAL DATA:  Lung cancer with brain metastases, previously treated with SRS. EXAM: MRI HEAD WITHOUT AND WITH CONTRAST TECHNIQUE: Multiplanar, multiecho pulse sequences of the brain and surrounding structures were obtained without and with intravenous contrast. CONTRAST:  64mL GADAVIST GADOBUTROL 1 MMOL/ML IV SOLN COMPARISON:  Head MRI 06/12/2022 and earlier FINDINGS: Brain: New lesions: None. Larger lesions: 1. 8 mm irregular, enhancing lesion in the inferior left frontal lobe (series 1200, image 161 and series 11,  image 25), larger than on the 3 most recent prior studies though smaller than on the 08/16/2021 first post treatment MRI and without significant edema. Stable or smaller lesions: 1. 3 mm enhancing lesion in the left parietal lobe, unchanged (series 1200, image 233). Small amount of associated hemosiderin deposition. Other brain findings: No acute infarct, midline shift, or extra-axial fluid collection is identified. There is mild cerebral atrophy. Vascular: Major intracranial vascular flow voids are preserved. Skull and upper cervical spine: No suspicious marrow lesion. Sinuses/Orbits: Unremarkable orbits. Clear paranasal sinuses. Small right and trace left mastoid effusions. Other: None. IMPRESSION: 1. Increased size of an 8 mm left frontal metastasis, possibly treatment effect though close follow-up is recommended to exclude recurrent tumor. 2. Unchanged treated left parietal metastasis. 3. No evidence of new intracranial metastases. Electronically Signed   By: Logan Bores M.D.   On: 09/15/2022 14:33    Impression/Plan: 71. 57 yo woman with Stage IV NSCLC, left upper lung, adenocarcinoma, (T2b, N3, M1) with brain metastasis,  EGFR L858R mutation (+). She appears to have recovered well from the effects of her recent SRS brain treatment and is currently without complaints.  Her recent posttreatment MRI brain scan from 09/11/2022 shows an excellent response to treatment with the previously identified enhancing lesions in the left middle frontal gyrus no longer visible and a stable appearance of the previously treated lesion in the high posterior left parietal lobe. There was some slight enlargement in the previously treated inferior left frontal lobe lesion, suspected to be treatment effect and no new lesions concerning for disease recurrence or progression.  We will plan to fuse her current scan with prior treatment plans to confirm that this is indeed a previously treated lesion and we will review the fused images at upcoming tumor board on 09/22/22. We discussed the plan to continue with serial MRI brain scans every 3 months going forward, to continue to monitor for any evidence of disease recurrence or progression and Manuela Schwartz will call her with any changes to this plan based on tumor board discussion. Otherwise, I will call her to review the results and any recommendations following future scans. She will also continue in routine follow up with Dr. Burr Medico for continued management of her systemic disease. She continues to tolerate the Tagrisso fairly well and her most recent disease restaging imaging from 05/21/2022 continues to show excellent response to treatment with decreased size of left supraclavicular and mediastinal lymph nodes and evolution of radiation changes in the LUL lung. She will have repeat disease restaging imaging on 09/22/2022 prior to her follow up visit with Dr. Burr Medico on 09/26/22.  She appears to have a good understanding of our recommendations and is comfortable and in agreement with the stated plan. I look forward to connecting with her by telephone in June 2024, to review MRI results but she knows that she is welcome to  call with any questions or concerns at any time in the interim.  I personally spent 20 minutes in this encounter including chart review, reviewing radiological studies, telephone conversation with the patient, entering orders coordinating her care and completing documentation.     Nicholos Johns, PA-C

## 2022-09-17 ENCOUNTER — Ambulatory Visit
Admission: RE | Admit: 2022-09-17 | Discharge: 2022-09-17 | Disposition: A | Discharge status: Home / Self Care | Payer: BC Managed Care – PPO | Source: Ambulatory Visit | Attending: Urology | Admitting: Urology

## 2022-09-17 ENCOUNTER — Encounter: Payer: Self-pay | Admitting: Urology

## 2022-09-17 DIAGNOSIS — C7931 Secondary malignant neoplasm of brain: Secondary | ICD-10-CM

## 2022-09-17 NOTE — Progress Notes (Signed)
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 Patient to receive most recent MRI results from 09/15/22. I verified patient's identity and began nursing interview.   Patient reports headaches 5/10, bilateral leg pains 5/10 (ankles). No other issues conveyed at this time.   Meaningful use complete.   Patient aware of their 10:30am-09/17/2022 telephone appointment w/ Ashlyn Bruning PA-C. I left my extension [phone] in case patient needs anything. Patient verbalized understanding. This concludes the nursing interview.   Patient contact [phone]     Leandra Kern, LPN

## 2022-09-22 ENCOUNTER — Ambulatory Visit (HOSPITAL_COMMUNITY)
Admission: RE | Admit: 2022-09-22 | Discharge: 2022-09-22 | Disposition: A | Discharge status: Home / Self Care | Payer: BC Managed Care – PPO | Source: Ambulatory Visit | Attending: Hematology | Admitting: Hematology

## 2022-09-22 ENCOUNTER — Other Ambulatory Visit: Payer: Self-pay

## 2022-09-22 ENCOUNTER — Inpatient Hospital Stay: Payer: BC Managed Care – PPO | Attending: Hematology

## 2022-09-22 ENCOUNTER — Inpatient Hospital Stay: Payer: BC Managed Care – PPO

## 2022-09-22 ENCOUNTER — Telehealth: Payer: Self-pay | Admitting: Radiation Therapy

## 2022-09-22 DIAGNOSIS — Z79899 Other long term (current) drug therapy: Secondary | ICD-10-CM | POA: Insufficient documentation

## 2022-09-22 DIAGNOSIS — M542 Cervicalgia: Secondary | ICD-10-CM | POA: Diagnosis not present

## 2022-09-22 DIAGNOSIS — Z923 Personal history of irradiation: Secondary | ICD-10-CM | POA: Diagnosis not present

## 2022-09-22 DIAGNOSIS — G43709 Chronic migraine without aura, not intractable, without status migrainosus: Secondary | ICD-10-CM | POA: Diagnosis not present

## 2022-09-22 DIAGNOSIS — C3412 Malignant neoplasm of upper lobe, left bronchus or lung: Secondary | ICD-10-CM | POA: Insufficient documentation

## 2022-09-22 DIAGNOSIS — C7931 Secondary malignant neoplasm of brain: Secondary | ICD-10-CM | POA: Insufficient documentation

## 2022-09-22 LAB — CBC WITH DIFFERENTIAL (CANCER CENTER ONLY)
Abs Immature Granulocytes: 0.01 10*3/uL (ref 0.00–0.07)
Basophils Absolute: 0 10*3/uL (ref 0.0–0.1)
Basophils Relative: 0 %
Eosinophils Absolute: 0 10*3/uL (ref 0.0–0.5)
Eosinophils Relative: 0 %
HCT: 33.5 % — ABNORMAL LOW (ref 36.0–46.0)
Hemoglobin: 11.7 g/dL — ABNORMAL LOW (ref 12.0–15.0)
Immature Granulocytes: 0 %
Lymphocytes Relative: 37 %
Lymphs Abs: 1.1 10*3/uL (ref 0.7–4.0)
MCH: 29 pg (ref 26.0–34.0)
MCHC: 34.9 g/dL (ref 30.0–36.0)
MCV: 83.1 fL (ref 80.0–100.0)
Monocytes Absolute: 0.4 10*3/uL (ref 0.1–1.0)
Monocytes Relative: 14 %
Neutro Abs: 1.4 10*3/uL — ABNORMAL LOW (ref 1.7–7.7)
Neutrophils Relative %: 49 %
Platelet Count: 157 10*3/uL (ref 150–400)
RBC: 4.03 MIL/uL (ref 3.87–5.11)
RDW: 13.3 % (ref 11.5–15.5)
WBC Count: 2.9 10*3/uL — ABNORMAL LOW (ref 4.0–10.5)
nRBC: 0 % (ref 0.0–0.2)

## 2022-09-22 LAB — CMP (CANCER CENTER ONLY)
ALT: 15 U/L (ref 0–44)
AST: 19 U/L (ref 15–41)
Albumin: 4.2 g/dL (ref 3.5–5.0)
Alkaline Phosphatase: 77 U/L (ref 38–126)
Anion gap: 8 (ref 5–15)
BUN: 20 mg/dL (ref 6–20)
CO2: 26 mmol/L (ref 22–32)
Calcium: 9.7 mg/dL (ref 8.9–10.3)
Chloride: 104 mmol/L (ref 98–111)
Creatinine: 1.3 mg/dL — ABNORMAL HIGH (ref 0.44–1.00)
GFR, Estimated: 49 mL/min — ABNORMAL LOW (ref >60)
Glucose, Bld: 87 mg/dL (ref 70–99)
Potassium: 4.6 mmol/L (ref 3.5–5.1)
Sodium: 138 mmol/L (ref 135–145)
Total Bilirubin: 0.4 mg/dL (ref 0.3–1.2)
Total Protein: 7.5 g/dL (ref 6.5–8.1)

## 2022-09-22 MED ORDER — SODIUM CHLORIDE (PF) 0.9 % IJ SOLN
INTRAMUSCULAR | Status: AC
  Filled 2022-09-22: qty 50

## 2022-09-22 MED ORDER — IOHEXOL 300 MG/ML  SOLN
100.0000 mL | Freq: Once | INTRAMUSCULAR | Status: AC | PRN
  Administered 2022-09-22: 100 mL via INTRAVENOUS

## 2022-09-22 NOTE — Telephone Encounter (Signed)
Left pt a voicemail regarding the conference discussion/recommendation from this morning. Requested a call back if she wants to be scheduled to meet with Dr. Mickeal Skinner regarding her headaches.  Mont Dutton R.T.(R)(T) Radiation Special Procedures Navigator

## 2022-09-26 ENCOUNTER — Inpatient Hospital Stay (HOSPITAL_BASED_OUTPATIENT_CLINIC_OR_DEPARTMENT_OTHER): Payer: BC Managed Care – PPO | Admitting: Hematology

## 2022-09-26 ENCOUNTER — Other Ambulatory Visit: Payer: Self-pay

## 2022-09-26 ENCOUNTER — Other Ambulatory Visit (HOSPITAL_COMMUNITY): Payer: Self-pay

## 2022-09-26 ENCOUNTER — Encounter: Payer: Self-pay | Admitting: Hematology

## 2022-09-26 VITALS — BP 127/73 | HR 85 | Temp 97.8°F | Resp 19 | Ht 60.0 in | Wt 257.8 lb

## 2022-09-26 DIAGNOSIS — C3412 Malignant neoplasm of upper lobe, left bronchus or lung: Secondary | ICD-10-CM

## 2022-09-26 MED ORDER — ZOLPIDEM TARTRATE 10 MG PO TABS
5.0000 mg | ORAL_TABLET | Freq: Every evening | ORAL | 0 refills | Status: DC | PRN
  Filled 2022-09-26: qty 30, 30d supply, fill #0

## 2022-09-26 MED ORDER — OSIMERTINIB MESYLATE 80 MG PO TABS
80.0000 mg | ORAL_TABLET | Freq: Every day | ORAL | 3 refills | Status: DC
  Filled 2022-09-26 – 2022-10-10 (×2): qty 30, 30d supply, fill #0
  Filled 2022-11-06: qty 30, 30d supply, fill #1
  Filled 2022-12-09: qty 30, 30d supply, fill #2
  Filled 2023-01-08: qty 30, 30d supply, fill #3

## 2022-09-26 NOTE — Assessment & Plan Note (Signed)
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 She tolerates moderately well with mucositis and skin toxicities.   -she had thoracic radiation (The primary tumor and involved mediastinal adenopathy were treated to 66 Gy in 33 fractions of 2 Gy), completed on 11/04/2021 -I personally reviewed her recent restaging CT scan from May 22, 2022, which showed continued response in the primary site and thoracic adenopathy, or new lesions. -She is clinically doing better, lives independently, tolerating Tagrisso well, will continue. -Restaging brain MRI from December 21st 2023 showed a stable brain lesions. -I reviewed her restaging CT from 09/23/2022, which showed area of chronic postradiation mass-like fibrosis in the posteromedial aspect of the left upper lung, without definitive findings of locally recurrent disease or definite metastatic disease in the chest, abdomen or pelvis. I discussed with pt

## 2022-09-26 NOTE — Progress Notes (Signed)
Abigail Golden   [phone] Fax:(336) [phone]   Clinic Follow up Note   Patient Care Team: Richmond CampbellKaplan, Kristen W., PA-C as PCP - General (Family Medicine) Malachy MoodFeng, Farrell Broerman, MD as Consulting Physician (Hematology and Oncology) Margaretmary DysManning, Matthew, MD as Consulting Physician (Radiation Oncology)  Date of Service:  09/26/2022  CHIEF COMPLAINT: f/u of metastatic lung cancer   CURRENT THERAPY:  Tagrisso, 80 mg daily, started 05/22/21     ASSESSMENT:  Abigail Golden is a 57 y.o. 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 She tolerates moderately well with mucositis and skin toxicities.   -she had thoracic radiation (The primary tumor and involved mediastinal adenopathy were treated to 66 Gy in 33 fractions of 2 Gy), completed on 11/04/2021 -I personally reviewed her recent restaging CT scan from May 22, 2022, which showed continued response in the primary site and thoracic adenopathy, or new lesions. -She is clinically doing better, lives independently, tolerating Tagrisso well, will continue. -Restaging brain MRI from December 21st 2023 showed a stable brain lesions. -I reviewed her restaging CT from 09/23/2022, which showed area of chronic postradiation mass-like fibrosis in the posteromedial aspect of the left upper lung, without definitive findings of locally recurrent disease or definite metastatic disease in the chest, abdomen or pelvis. I discussed with pt  -She is clinically stable, except new onset left-sided shoulder/neck pain and headaches.  I reviewed her recent brain MRI, and encouraged her to see a neurologist Dr. Barbaraann CaoVaslow in our office. -Continue Tagrisso and f/u in 2 months, to repeat a next CT scan in 4 months   Brain metastasis -From her lung cancer, status post brain radiation -Recent brain MRI  showed slightly enlarged 8 mm lesion in the frontal lobe.  This has been reviewed with radiation oncologist and she will be referred to see neuro oncologist Dr. Barbaraann CaoVaslow.  PLAN: -discuss Brain MRI -discuss CT scan findings  -continue Tagrisso pt tolerating it well. -lab reviewed -referral to Dr. 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 Metastatic disease would be the most likely cause of this appearance. Other possibilities include venous infarctions and cerebritis. MRI with contrast recommended.    04/23/2021 Imaging   MR Brain W and Wo Contrast  IMPRESSION: 1. Metastatic disease to the brain. Two enhancing brain masses with moderate associated vasogenic edema: solid 16 mm left parietal lobe mass, and a larger 29 mm rim enhancing cystic or necrotic mass in the left inferior frontal gyrus. Two other small 2-3 mm metastases in the right inferior frontal gyrus, right posterior temporal lobe. And questionable two additional punctate metastases in both superior frontal gyri abutting the falx (series 18, image 46). 2. No significant intracranial mass effect. No other intracranial abnormality.   04/23/2021 Imaging  CT Chest W Contrast  IMPRESSION: 4.5 cm spiculated left upper lobe mass most compatible with primary lung cancer. This abuts the medial pleural surface. A few small adjacent pre-vascular mediastinal lymph nodes,  none pathologically enlarged. Recommend cardiothoracic surgical and oncologic consultation.   No acute findings or evidence of metastatic disease in the abdomen or pelvis.   04/24/2021 Cancer Staging   Staging form: Lung, AJCC 8th Edition - Clinical stage from 04/24/2021: Stage IVA (cT2, cN2, pM1b) - Signed by Malachy Mood, MD on 05/14/2021   04/24/2021 Initial Biopsy   FINAL MICROSCOPIC DIAGNOSIS:   A. LUNG, LUL, FINE NEEDLE ASPIRATION:  - Malignant cells present, consistent with adenocarcinoma   B. LUNG, LUL, BRUSHING:  - Malignant cells consistent with adenocarcinoma, see comment    COMMENT:  B.  Immunohistochemical stains show that the tumor cells are positive for TTF-1 while they are negative for p63 and CK5/6, consistent with above interpretation. The number of tumor cells appears to be insufficient for molecular studies.   05/03/2021 Initial Diagnosis   Lung cancer (HCC)   05/08/2021 - 05/13/2021 Radiation Therapy   SRS treatment to the metastatic brain lesions under the care of Dr. Kathrynn Running    08/05/2021 Imaging   EXAM: CT CHEST, ABDOMEN, AND PELVIS WITH CONTRAST  IMPRESSION: 1. No substantial change in size of the left upper lobe pulmonary lesion. 2. Borderline to mildly enlarged adjacent prevascular/AP window lymph nodes have increased in size in the interval. This finding raises concern for disease progression. 3. Interval development of upper normal bilateral common iliac and left external iliac lymph nodes. Close attention on follow-up recommended. 4. No other evidence for metastatic disease in the abdomen or pelvis.   09/22/2022 Imaging    IMPRESSION: 1. Area of chronic postradiation mass-like fibrosis in the posteromedial aspect of the left upper lung, without definitive findings of locally recurrent disease or definite metastatic disease in the chest, abdomen or pelvis. 2. Mild atherosclerosis. 3. Additional incidental findings, as above.        INTERVAL  HISTORY:  Abigail Golden is here for a follow up of  metastatic lung cancer   She was last seen by me on 07/28/2022. She presents to the clinic accompanied by family. Pt stated she has been having some bad headaches. Pt fell 08/17/2022. Pt stated that headaches started after she fell, but did not hit her head.Migraine medicine took the pain away.   All other systems were reviewed with the patient and are negative.  MEDICAL HISTORY:  Past Medical History:  Diagnosis Date   Allergy    Anxiety    Chronic bronchitis    Depression    Headache    Hypoglycemia    Lower back pain    since fall ~ 2012 (05/25/2015)   Migraine    05/25/2015 "probably once/month"   Migraine headache    Pain in left hip    since fall ~ 2012 (05/25/2015)   Pneumonia "several times"    SURGICAL HISTORY: Past Surgical History:  Procedure Laterality Date   ABDOMINAL HYSTERECTOMY  11/2002   "found benign tumors"   BRONCHIAL BIOPSY  04/24/2021   Procedure: BRONCHIAL BIOPSIES;  Surgeon: Josephine Igo, DO;  Location: [facility];  Service: Pulmonary;;   BRONCHIAL BRUSHINGS  04/24/2021   Procedure: BRONCHIAL BRUSHINGS;  Surgeon: Josephine Igo, DO;  Location: [facility];  Service: Pulmonary;;   BRONCHIAL NEEDLE ASPIRATION BIOPSY  04/24/2021   Procedure: BRONCHIAL NEEDLE ASPIRATION BIOPSIES;  Surgeon: Josephine Igo, DO;  Location: [facility];  Service: Pulmonary;;   CHEST TUBE INSERTION  04/24/2021   Procedure: CHEST TUBE INSERTION;  Surgeon: Josephine Igo, DO;  Location: [facility];  Service: Pulmonary;;   CHOLECYSTECTOMY N/A 05/25/2015   Procedure: LAPAROSCOPIC CHOLECYSTECTOMY;  Surgeon: Axel Filler, MD;  Location: [facility];  Service: General;  Laterality: N/A;   LAPAROSCOPIC CHOLECYSTECTOMY  05/25/2015   MENISCUS REPAIR Right 03/2017   VIDEO BRONCHOSCOPY WITH ENDOBRONCHIAL NAVIGATION N/A 04/24/2021   Procedure: VIDEO BRONCHOSCOPY WITH ENDOBRONCHIAL NAVIGATION;  Surgeon: Josephine Igo, DO;  Location: [facility];  Service: Pulmonary;  Laterality: N/A;   VIDEO BRONCHOSCOPY WITH RADIAL ENDOBRONCHIAL ULTRASOUND  04/24/2021   Procedure: VIDEO BRONCHOSCOPY WITH RADIAL ENDOBRONCHIAL ULTRASOUND;  Surgeon: Josephine Igo, DO;  Location: [facility];  Service: Pulmonary;;    I have reviewed the social history and family history with the patient and they are unchanged from previous note.  ALLERGIES:  is allergic to celebrex [celecoxib] and minocycline.  MEDICATIONS:  Current Outpatient Medications  Medication Sig Dispense Refill   acetaminophen (TYLENOL) 500 MG tablet Take 500 mg by mouth every 6 (six) hours as needed for moderate pain or headache.     albuterol (VENTOLIN HFA) 108 (90 Base) MCG/ACT inhaler Inhale 2 puffs into the lungs every 4 (four) hours as needed.     ALPRAZolam (XANAX) 0.25 MG tablet Take 0.25 mg by mouth every 4 (four) hours as needed for anxiety. for anxiety     aspirin EC 81 MG tablet Take 81 mg by mouth daily. Swallow whole.     citalopram (CELEXA) 10 MG tablet Take 10 mg by mouth at bedtime.     clindamycin (CLEOCIN T) 1 % external solution Apply topically 2 (two) times daily. 30 mL 0   diclofenac sodium (VOLTAREN) 1 % GEL Apply topically as needed. To affected area     diphenhydrAMINE (BENADRYL) 25 MG tablet Take 25 mg by mouth See admin instructions. 25 mg at bedtime 25 mg as needed for itching     Docusate Sodium (STOOL SOFTENER LAXATIVE PO) Take 1 tablet by mouth as needed.     eletriptan (RELPAX) 20 MG tablet Take 20 mg by mouth as needed for headache (at onset of migraine.). May repeat in 2 hours if needed.     fluconazole (DIFLUCAN) 200 MG tablet Take 1 tablet (200 mg total) by mouth daily. 14 tablet 0   fluticasone (FLONASE) 50 MCG/ACT nasal spray Place 2 sprays into both nostrils daily as needed for allergies.     hydrOXYzine (ATARAX/VISTARIL) 25 MG tablet Take 25 mg by mouth See admin instructions. 25 mg at bedtime 25 mg during the day as needed for  itching/anxiety     loratadine (CLARITIN) 10 MG tablet Take 10 mg by mouth daily.     magic mouthwash (nystatin, lidocaine, diphenhydrAMINE, alum & mag hydroxide) suspension Swish and swallow 5 mls by mouth every 6 hours as needed 250 mL 1   magic mouthwash w/lidocaine SOLN Take 5 mLs by mouth every 6 hours as needed for mouth pain. Swish and Swallow (Patient not taking: Reported on 08/19/2021) 250 mL 1   montelukast (SINGULAIR) 10 MG tablet Take 10 mg by mouth at bedtime.     NYSTATIN powder APPLY  POWDER TOPICALLY TO AFFECTED AREA THREE TIMES DAILY 60 g 0   ondansetron (ZOFRAN-ODT) 4 MG disintegrating tablet DISSOLVE 1 TABLET IN MOUTH EVERY 8 HOURS AS NEEDED FOR NAUSEA AND VOMITING 18 tablet 1   osimertinib mesylate (TAGRISSO) 80  MG tablet Take 1 tablet (80 mg total) by mouth daily. 30 tablet 3   prochlorperazine (COMPAZINE) 10 MG tablet TAKE 1 TABLET BY MOUTH EVERY 6 HOURS AS NEEDED FOR NAUSEA FOR VOMITING 30 tablet 1   solifenacin (VESICARE) 10 MG tablet Take 10 mg by mouth every evening.     zolpidem (AMBIEN) 10 MG tablet Take 0.5-1 tablets (5-10 mg total) by mouth at bedtime as needed for sleep. 30 tablet 0   No current facility-administered medications for this visit.    PHYSICAL EXAMINATION: ECOG PERFORMANCE STATUS: 1 - Symptomatic but completely ambulatory  Vitals:   09/26/22 1410  BP: 127/73  Pulse: 85  Resp: 19  Temp: 97.8 F (36.6 C)  SpO2: 100%   Wt Readings from Last 3 Encounters:  09/26/22 257 lb 12.8 oz (116.9 kg)  07/28/22 225 lb 1.6 oz (102.1 kg)  05/26/22 243 lb 3.2 oz (110.3 kg)     GENERAL:alert, no distress and comfortable SKIN: skin color normal, no rashes or significant lesions EYES: normal, Conjunctiva are pink and non-injected, sclera clear  NEURO: alert & oriented x 3 with fluent speech    LABORATORY DATA:  I have reviewed the data as listed    Latest Ref Rng & Units 09/22/2022    1:57 PM 07/28/2022    1:57 PM 05/21/2022   12:05 PM  CBC  WBC 4.0 -  10.5 K/uL 2.9  3.5  3.4   Hemoglobin 12.0 - 15.0 g/dL 16.1  09.6  04.5   Hematocrit 36.0 - 46.0 % 33.5  32.1  32.1   Platelets 150 - 400 K/uL 157  160  168         Latest Ref Rng & Units 09/22/2022    1:57 PM 07/28/2022    1:57 PM 05/21/2022   12:05 PM  CMP  Glucose 70 - 99 mg/dL 87  409  98   BUN 6 - 20 mg/dL Creatinine 0.44 - 1.00 mg/dL 8.11  9.14  7.82   Sodium 135 - 145 mmol/L 138  137  135   Potassium 3.5 - 5.1 mmol/L 4.6  4.4  4.4   Chloride 98 - 111 mmol/L 104  106  103   CO2 22 - 32 mmol/L Calcium 8.9 - 10.3 mg/dL 9.7  9.1  9.6   Total Protein 6.5 - 8.1 g/dL 7.5  6.6  7.6   Total Bilirubin 0.3 - 1.2 mg/dL 0.4  0.4  0.5   Alkaline Phos 38 - 126 U/L 77  73  84   AST 15 - 41 U/L ALT 0 - 44 U/L RADIOGRAPHIC STUDIES: I have personally reviewed the radiological images as listed and agreed with the findings in the report. No results found.    No orders of the defined types were placed in this encounter.  All questions were answered. The patient knows to call the clinic with any problems, questions or concerns. No barriers to learning was detected. The total time spent in the appointment was 30 minutes.     Malachy Mood, MD 09/26/2022   Carolin Coy, CMA, am acting as scribe for Malachy Mood, MD.   I have reviewed the above documentation for accuracy and completeness, and I agree with the above.

## 2022-09-27 ENCOUNTER — Other Ambulatory Visit (HOSPITAL_COMMUNITY): Payer: Self-pay

## 2022-10-01 ENCOUNTER — Other Ambulatory Visit: Payer: Self-pay | Admitting: Hematology

## 2022-10-02 ENCOUNTER — Telehealth: Payer: Self-pay

## 2022-10-02 ENCOUNTER — Other Ambulatory Visit: Payer: Self-pay | Admitting: Hematology

## 2022-10-02 MED ORDER — ZOLPIDEM TARTRATE 10 MG PO TABS: ORAL_TABLET | ORAL | 0 refills | Status: DC

## 2022-10-02 NOTE — Telephone Encounter (Signed)
Pt LVM requesting a refill on her Ambien prescription.  Pt stated that Dr. Mosetta Putt was supposed to refill her prescription when she was in the office on Friday 09/26/2022.  Pt stated she contact her pharmacy and was told they do not have a prescription.  Notified Dr. Mosetta Putt of the pt's call.

## 2022-10-07 ENCOUNTER — Other Ambulatory Visit (HOSPITAL_COMMUNITY): Payer: Self-pay

## 2022-10-10 ENCOUNTER — Other Ambulatory Visit: Payer: Self-pay

## 2022-10-10 ENCOUNTER — Other Ambulatory Visit (HOSPITAL_COMMUNITY): Payer: Self-pay

## 2022-10-14 ENCOUNTER — Inpatient Hospital Stay (HOSPITAL_BASED_OUTPATIENT_CLINIC_OR_DEPARTMENT_OTHER): Payer: BC Managed Care – PPO | Admitting: Internal Medicine

## 2022-10-14 ENCOUNTER — Telehealth: Payer: Self-pay

## 2022-10-14 ENCOUNTER — Other Ambulatory Visit: Payer: Self-pay

## 2022-10-14 VITALS — BP 139/72 | HR 78 | Temp 97.7°F | Resp 18 | Wt 258.2 lb

## 2022-10-14 DIAGNOSIS — G43709 Chronic migraine without aura, not intractable, without status migrainosus: Secondary | ICD-10-CM

## 2022-10-14 DIAGNOSIS — C7931 Secondary malignant neoplasm of brain: Secondary | ICD-10-CM | POA: Diagnosis not present

## 2022-10-14 DIAGNOSIS — C3412 Malignant neoplasm of upper lobe, left bronchus or lung: Secondary | ICD-10-CM | POA: Diagnosis not present

## 2022-10-14 MED ORDER — PREDNISONE 50 MG PO TABS: 50.0000 mg | ORAL_TABLET | Freq: Every day | ORAL | 0 refills | Status: DC

## 2022-10-14 NOTE — Progress Notes (Signed)
[facility] [address]  [place], [place] [address] [phone]   New Patient Evaluation  Date of Service: 10/14/22 Patient Name: Abigail Golden Patient MRN: [id number] Patient DOB: March 24, 1966 Provider: Henreitta Leber, MD  Identifying Statement:  Abigail Golden is a 57 y.o. female with Chronic migraine without aura without status migrainosus, not intractable  Malignant neoplasm metastatic to brain who presents for initial consultation and evaluation regarding cancer associated neurologic deficits.    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 Metastatic disease would be the most likely cause of this appearance. Other possibilities include venous infarctions and cerebritis. MRI with contrast recommended.    04/23/2021 Imaging   MR Brain W and Wo Contrast  IMPRESSION: 1. Metastatic disease to the brain. Two enhancing brain masses with moderate associated vasogenic edema: solid 16 mm left parietal lobe mass, and a larger 29 mm rim enhancing cystic or necrotic mass in the left inferior frontal gyrus. Two other small 2-3 mm metastases in the right inferior frontal gyrus, right posterior temporal lobe. And questionable two additional punctate metastases in both superior frontal gyri abutting the falx (series 18, image 46). 2. No significant intracranial mass effect.  No other intracranial abnormality.   04/23/2021 Imaging   CT Chest W Contrast  IMPRESSION: 4.5 cm spiculated left upper lobe mass most compatible with primary lung cancer. This abuts the medial pleural surface. A few small adjacent pre-vascular mediastinal lymph nodes, none pathologically enlarged. Recommend cardiothoracic surgical and oncologic consultation.   No acute findings or evidence of metastatic disease in the abdomen or pelvis.   04/24/2021 Cancer Staging   Staging form: Lung, AJCC 8th Edition - Clinical stage from 04/24/2021: Stage IVA (cT2, cN2, pM1b) - Signed by Malachy Mood, MD on 05/14/2021   04/24/2021 Initial Biopsy   FINAL MICROSCOPIC DIAGNOSIS:   A. LUNG, LUL, FINE NEEDLE ASPIRATION:  - Malignant cells present, consistent with adenocarcinoma   B. LUNG, LUL, BRUSHING:  - Malignant cells consistent with adenocarcinoma, see comment    COMMENT:  B.  Immunohistochemical stains show that the tumor cells are positive for TTF-1 while they are negative for p63 and CK5/6, consistent with above interpretation. The number of tumor cells appears to be insufficient for molecular studies.   05/03/2021 Initial Diagnosis   Lung cancer (HCC)   05/08/2021 - 05/13/2021 Radiation Therapy   SRS treatment to the metastatic brain lesions under the care of Dr. Kathrynn Running    08/05/2021 Imaging   EXAM: CT CHEST, ABDOMEN, AND PELVIS WITH CONTRAST  IMPRESSION: 1. No substantial change in size of the left upper lobe pulmonary lesion. 2. Borderline to mildly enlarged adjacent prevascular/AP window lymph nodes have increased in size in the interval. This finding raises concern for disease progression. 3. Interval development of upper normal bilateral common iliac and left external iliac lymph nodes. Close attention on follow-up recommended.  4. No other evidence for metastatic disease in the abdomen or pelvis.   09/22/2022 Imaging    IMPRESSION: 1. Area of chronic postradiation mass-like  fibrosis in the posteromedial aspect of the left upper lung, without definitive findings of locally recurrent disease or definite metastatic disease in the chest, abdomen or pelvis. 2. Mild atherosclerosis. 3. Additional incidental findings, as above.      CNS Oncologic History 12/10/21: Single fraction SRS brain PTV9 08/27/21: Single fraction SRS Brain PTV7-8 05/13/21: Fractionated SRS Brain PTV1 - PTV6   History of Present Illness: The patient's records from the referring physician were obtained and reviewed and the patient interviewed to confirm this HPI.  Abigail Golden presents today to discuss recent neurologic symptoms.  She describes new type of head and neck pain, described as "aching or shooting pain in left side of neck, radiating up to back of head or down the left arm at times".  Onset was 2 months ago, after a provoked fall; she did "twist" her head at that time.  Frequency of pain is sporadic, but overall decreasing, now may be less than daily.  No issues with function of hand, walking.  She continues to have "typical migraines" as well, ~2x per month.  This is unchanged from prior.  Medications: Current Outpatient Medications on File Prior to Visit  Medication Sig Dispense Refill   acetaminophen (TYLENOL) 500 MG tablet Take 500 mg by mouth every 6 (six) hours as needed for moderate pain or headache.     albuterol (VENTOLIN HFA) 108 (90 Base) MCG/ACT inhaler Inhale 2 puffs into the lungs every 4 (four) hours as needed.     ALPRAZolam (XANAX) 0.25 MG tablet Take 0.25 mg by mouth every 4 (four) hours as needed for anxiety. for anxiety     aspirin EC 81 MG tablet Take 81 mg by mouth daily. Swallow whole.     citalopram (CELEXA) 10 MG tablet Take 10 mg by mouth at bedtime.     clindamycin (CLEOCIN T) 1 % external solution Apply topically 2 (two) times daily. 30 mL 0   diclofenac sodium (VOLTAREN) 1 % GEL Apply topically as needed. To affected area     diphenhydrAMINE (BENADRYL)  25 MG tablet Take 25 mg by mouth See admin instructions. 25 mg at bedtime 25 mg as needed for itching     Docusate Sodium (STOOL SOFTENER LAXATIVE PO) Take 1 tablet by mouth as needed.     eletriptan (RELPAX) 20 MG tablet Take 20 mg by mouth as needed for headache (at onset of migraine.). May repeat in 2 hours if needed.     fluconazole (DIFLUCAN) 200 MG tablet Take 1 tablet (200 mg total) by mouth daily. 14 tablet 0   fluticasone (FLONASE) 50 MCG/ACT nasal spray Place 2 sprays into both nostrils daily as needed for allergies.     hydrOXYzine (ATARAX/VISTARIL) 25 MG tablet Take 25 mg by mouth See admin instructions. 25 mg at bedtime 25 mg during the day as needed for itching/anxiety     loratadine (CLARITIN) 10 MG tablet Take 10 mg by mouth daily.     magic mouthwash (nystatin, lidocaine, diphenhydrAMINE, alum & mag hydroxide) suspension Swish and swallow 5 mls by mouth every 6 hours as needed 250 mL 1   magic mouthwash w/lidocaine SOLN Take 5 mLs by mouth every 6 hours as needed for mouth pain. Swish and Swallow (Patient not taking: Reported on 08/19/2021) 250 mL 1   montelukast (SINGULAIR) 10 MG tablet Take  10 mg by mouth at bedtime.     NYSTATIN powder APPLY  POWDER TOPICALLY TO AFFECTED AREA THREE TIMES DAILY 60 g 0   ondansetron (ZOFRAN-ODT) 4 MG disintegrating tablet DISSOLVE 1 TABLET IN MOUTH EVERY 8 HOURS AS NEEDED FOR NAUSEA AND VOMITING 18 tablet 1   osimertinib mesylate (TAGRISSO) 80 MG tablet Take 1 tablet (80 mg total) by mouth daily. 30 tablet 3   prochlorperazine (COMPAZINE) 10 MG tablet TAKE 1 TABLET BY MOUTH EVERY 6 HOURS AS NEEDED FOR NAUSEA FOR VOMITING 30 tablet 1   solifenacin (VESICARE) 10 MG tablet Take 10 mg by mouth every evening.     zolpidem (AMBIEN) 10 MG tablet TAKE 1/2 TO 1 (ONE-HALF TO ONE) TABLET BY MOUTH AT BEDTIME AS NEEDED FOR SLEEP 30 tablet 0   No current facility-administered medications on file prior to visit.    Allergies:  Allergies  Allergen Reactions    Celebrex [Celecoxib] Rash    On face itching   Minocycline Rash    On face Itching   Past Medical History:  Past Medical History:  Diagnosis Date   Allergy    Anxiety    Chronic bronchitis    Depression    Headache    Hypoglycemia    Lower back pain    since fall ~ 2012 (05/25/2015)   Migraine    05/25/2015 "probably once/month"   Migraine headache    Pain in left hip    since fall ~ 2012 (05/25/2015)   Pneumonia "several times"   Past Surgical History:  Past Surgical History:  Procedure Laterality Date   ABDOMINAL HYSTERECTOMY  11/2002   "found benign tumors"   BRONCHIAL BIOPSY  04/24/2021   Procedure: BRONCHIAL BIOPSIES;  Surgeon: Josephine Igo, DO;  Location: [facility];  Service: Pulmonary;;   BRONCHIAL BRUSHINGS  04/24/2021   Procedure: BRONCHIAL BRUSHINGS;  Surgeon: Josephine Igo, DO;  Location: [facility];  Service: Pulmonary;;   BRONCHIAL NEEDLE ASPIRATION BIOPSY  04/24/2021   Procedure: BRONCHIAL NEEDLE ASPIRATION BIOPSIES;  Surgeon: Josephine Igo, DO;  Location: [facility];  Service: Pulmonary;;   CHEST TUBE INSERTION  04/24/2021   Procedure: CHEST TUBE INSERTION;  Surgeon: Josephine Igo, DO;  Location: [facility];  Service: Pulmonary;;   CHOLECYSTECTOMY N/A 05/25/2015   Procedure: LAPAROSCOPIC CHOLECYSTECTOMY;  Surgeon: Axel Filler, MD;  Location: [facility];  Service: General;  Laterality: N/A;   LAPAROSCOPIC CHOLECYSTECTOMY  05/25/2015   MENISCUS REPAIR Right 03/2017   VIDEO BRONCHOSCOPY WITH ENDOBRONCHIAL NAVIGATION N/A 04/24/2021   Procedure: VIDEO BRONCHOSCOPY WITH ENDOBRONCHIAL NAVIGATION;  Surgeon: Josephine Igo, DO;  Location: [facility];  Service: Pulmonary;  Laterality: N/A;   VIDEO BRONCHOSCOPY WITH RADIAL ENDOBRONCHIAL ULTRASOUND  04/24/2021   Procedure: VIDEO BRONCHOSCOPY WITH RADIAL ENDOBRONCHIAL ULTRASOUND;  Surgeon: Josephine Igo, DO;  Location: [facility];  Service: Pulmonary;;   Social History:  Social History    Socioeconomic History   Marital status: Single    Spouse name: Not on file   Number of children: 0   Years of education: 2 years of college   Highest education level: Not on file  Occupational History   Not on file  Tobacco Use   Smoking status: Never   Smokeless tobacco: Never  Vaping Use   Vaping Use: Never used  Substance and Sexual Activity   Alcohol use: Never   Drug use: Never   Sexual activity: Not on file  Other Topics Concern   Not on file  Social  History Narrative   Lives at home alone   Caffeine: 16 oz daily   Social Determinants of Health   Financial Resource Strain: Medium Risk (11/12/2021)   Overall Financial Resource Strain (CARDIA)    Difficulty of Paying Living Expenses: Somewhat hard  Food Insecurity: No Food Insecurity (09/17/2022)   Hunger Vital Sign    Worried About Running Out of Food in the Last Year: Never true    Ran Out of Food in the Last Year: Never true  Transportation Needs: No Transportation Needs (09/17/2022)   PRAPARE - Administrator, Civil Service (Medical): No    Lack of Transportation (Non-Medical): No  Physical Activity: Not on file  Stress: Not on file  Social Connections: Not on file  Intimate Partner Violence: Not At Risk (09/17/2022)   Humiliation, Afraid, Rape, and Kick questionnaire    Fear of Current or Ex-Partner: No    Emotionally Abused: No    Physically Abused: No    Sexually Abused: No   Family History:  Family History  Problem Relation Age of Onset   Heart disease Mother    Evelene Croon Parkinson White syndrome Father    Insurance account manager Parkinson White syndrome Paternal Grandmother    Diabetes Paternal Grandmother    Breast cancer Neg Hx     Review of Systems: Constitutional: Doesn't report fevers, chills or abnormal weight loss Eyes: Doesn't report blurriness of vision Ears, nose, mouth, throat, and face: Doesn't report sore throat Respiratory: Doesn't report cough, dyspnea or wheezes Cardiovascular: Doesn't  report palpitation, chest discomfort  Gastrointestinal:  Doesn't report nausea, constipation, diarrhea GU: Doesn't report incontinence Skin: Doesn't report skin rashes Neurological: Per HPI Musculoskeletal: Doesn't report joint pain Behavioral/Psych: Doesn't report anxiety  Physical Exam: Vitals:   10/14/22 1130  BP: 139/72  Pulse: 78  Resp: 18  Temp: 97.7 F (36.5 C)  SpO2: 100%   KPS: 80. General: Alert, cooperative, pleasant, in no acute distress Head: Normal EENT: No conjunctival injection or scleral icterus.  Lungs: Resp effort normal Cardiac: Regular rate Abdomen: Non-distended abdomen Skin: No rashes cyanosis or petechiae. Extremities: No clubbing or edema  Neurologic Exam: Mental Status: Awake, alert, attentive to examiner. Oriented to self and environment. Language is fluent with intact comprehension.  Cranial Nerves: Visual acuity is grossly normal. Visual fields are full. Extra-ocular movements intact. No ptosis. Face is symmetric Motor: Tone and bulk are normal. Power is full in both arms and legs. Reflexes are symmetric, no pathologic reflexes present.  Sensory: Intact to light touch Gait: Normal.   Labs: I have reviewed the data as listed    Component Value Date/Time   NA 138 09/22/2022 1357   K 4.6 09/22/2022 1357   CL 104 09/22/2022 1357   CO2 26 09/22/2022 1357   GLUCOSE 87 09/22/2022 1357   BUN 20 09/22/2022 1357   CREATININE 1.30 (H) 09/22/2022 1357   CALCIUM 9.7 09/22/2022 1357   PROT 7.5 09/22/2022 1357   ALBUMIN 4.2 09/22/2022 1357   AST 19 09/22/2022 1357   ALT 15 09/22/2022 1357   ALKPHOS 77 09/22/2022 1357   BILITOT 0.4 09/22/2022 1357   GFRNONAA 49 (L) 09/22/2022 1357   GFRAA >60 02/27/2017 2320   Lab Results  Component Value Date   WBC 2.9 (L) 09/22/2022   NEUTROABS 1.4 (L) 09/22/2022   HGB 11.7 (L) 09/22/2022   HCT 33.5 (L) 09/22/2022   MCV 83.1 09/22/2022   PLT 157 09/22/2022   Assessment/Plan Chronic migraine without  aura without  status migrainosus, not intractable  Malignant neoplasm metastatic to brain  Abigail Golden presents with breakthrough head and neck pain provoked by earlier trauma, likely soft tissue injury.  We suspect mechanism of neuropathic pain is soft tissue inflammation.  MRI brain from last month provides window into upper cervcial spine, which appears structurally normal.  Recommended short course of prednisone,  daily, for residual soft tissue inflammation.  Ok with PRN NSAIDs as well, may be more effective than Tylenol.  We spent twenty additional minutes teaching regarding the natural history, biology, and historical experience in the treatment of neurologic complications of cancer.   We appreciate the opportunity to participate in the care of Abigail Golden.  She will continue to follow with radiation oncology for CNS mets, follow up as needed with our clinic.  All questions were answered. The patient knows to call the clinic with any problems, questions or concerns. No barriers to learning were detected.  The total time spent in the encounter was 40 minutes and more than 50% was on counseling and review of test results   Henreitta Leber, MD Medical Director of Neuro-Oncology [facility] at [facility] 10/14/22 11:50 AM

## 2022-10-14 NOTE — Telephone Encounter (Signed)
Call placed to [facility] regarding prior authorization for Zolpidem tablets. Per [facility] Representative,  Patient can get a quantity of 30 tablets per 30 days and it is covered; however Patient filled 15 tablets on 10/11/2022 and her next refill is not due until 10/26/2022. Collaborative CMA and Provider notified.

## 2022-10-20 ENCOUNTER — Other Ambulatory Visit: Payer: Self-pay | Admitting: Radiation Therapy

## 2022-10-20 DIAGNOSIS — C7931 Secondary malignant neoplasm of brain: Secondary | ICD-10-CM

## 2022-11-06 ENCOUNTER — Other Ambulatory Visit (HOSPITAL_COMMUNITY): Payer: Self-pay

## 2022-11-11 ENCOUNTER — Other Ambulatory Visit (HOSPITAL_COMMUNITY): Payer: Self-pay

## 2022-11-18 ENCOUNTER — Other Ambulatory Visit: Payer: Self-pay

## 2022-11-18 ENCOUNTER — Emergency Department (HOSPITAL_COMMUNITY)
Admission: EM | Admit: 2022-11-18 | Discharge: 2022-11-18 | Disposition: A | Discharge status: Home / Self Care | Payer: BC Managed Care – PPO | Attending: Emergency Medicine | Admitting: Emergency Medicine

## 2022-11-18 ENCOUNTER — Encounter (HOSPITAL_COMMUNITY): Payer: Self-pay

## 2022-11-18 ENCOUNTER — Emergency Department (HOSPITAL_COMMUNITY): Payer: BC Managed Care – PPO

## 2022-11-18 DIAGNOSIS — Z7982 Long term (current) use of aspirin: Secondary | ICD-10-CM | POA: Insufficient documentation

## 2022-11-18 DIAGNOSIS — B379 Candidiasis, unspecified: Secondary | ICD-10-CM | POA: Insufficient documentation

## 2022-11-18 DIAGNOSIS — Z85118 Personal history of other malignant neoplasm of bronchus and lung: Secondary | ICD-10-CM | POA: Insufficient documentation

## 2022-11-18 DIAGNOSIS — Z79899 Other long term (current) drug therapy: Secondary | ICD-10-CM | POA: Insufficient documentation

## 2022-11-18 DIAGNOSIS — R944 Abnormal results of kidney function studies: Secondary | ICD-10-CM | POA: Insufficient documentation

## 2022-11-18 DIAGNOSIS — R1031 Right lower quadrant pain: Secondary | ICD-10-CM | POA: Insufficient documentation

## 2022-11-18 DIAGNOSIS — Z85841 Personal history of malignant neoplasm of brain: Secondary | ICD-10-CM | POA: Insufficient documentation

## 2022-11-18 DIAGNOSIS — B372 Candidiasis of skin and nail: Secondary | ICD-10-CM

## 2022-11-18 DIAGNOSIS — I959 Hypotension, unspecified: Secondary | ICD-10-CM | POA: Insufficient documentation

## 2022-11-18 DIAGNOSIS — R55 Syncope and collapse: Secondary | ICD-10-CM | POA: Insufficient documentation

## 2022-11-18 DIAGNOSIS — R109 Unspecified abdominal pain: Secondary | ICD-10-CM

## 2022-11-18 DIAGNOSIS — W19XXXA Unspecified fall, initial encounter: Secondary | ICD-10-CM | POA: Diagnosis not present

## 2022-11-18 LAB — CBC WITH DIFFERENTIAL/PLATELET
Abs Immature Granulocytes: 0.01 10*3/uL (ref 0.00–0.07)
Basophils Absolute: 0 10*3/uL (ref 0.0–0.1)
Basophils Relative: 0 %
Eosinophils Absolute: 0 10*3/uL (ref 0.0–0.5)
Eosinophils Relative: 0 %
HCT: 44.9 % (ref 36.0–46.0)
Hemoglobin: 14.4 g/dL (ref 12.0–15.0)
Immature Granulocytes: 0 %
Lymphocytes Relative: 19 %
Lymphs Abs: 0.5 10*3/uL — ABNORMAL LOW (ref 0.7–4.0)
MCH: 27.5 pg (ref 26.0–34.0)
MCHC: 32.1 g/dL (ref 30.0–36.0)
MCV: 85.7 fL (ref 80.0–100.0)
Monocytes Absolute: 0.3 10*3/uL (ref 0.1–1.0)
Monocytes Relative: 10 %
Neutro Abs: 1.9 10*3/uL (ref 1.7–7.7)
Neutrophils Relative %: 71 %
Platelets: 126 10*3/uL — ABNORMAL LOW (ref 150–400)
RBC: 5.24 MIL/uL — ABNORMAL HIGH (ref 3.87–5.11)
RDW: 13.2 % (ref 11.5–15.5)
WBC: 2.7 10*3/uL — ABNORMAL LOW (ref 4.0–10.5)
nRBC: 0 % (ref 0.0–0.2)

## 2022-11-18 LAB — TROPONIN I (HIGH SENSITIVITY)
Troponin I (High Sensitivity): 3 ng/L (ref <18)
Troponin I (High Sensitivity): 3 ng/L (ref <18)

## 2022-11-18 LAB — URINALYSIS, ROUTINE W REFLEX MICROSCOPIC
Bilirubin Urine: NEGATIVE
Glucose, UA: NEGATIVE mg/dL
Hgb urine dipstick: NEGATIVE
Ketones, ur: NEGATIVE mg/dL
Nitrite: NEGATIVE
Protein, ur: 30 mg/dL — AB
Specific Gravity, Urine: 1.023 (ref 1.005–1.030)
pH: 5 (ref 5.0–8.0)

## 2022-11-18 LAB — BASIC METABOLIC PANEL
Anion gap: 8 (ref 5–15)
BUN: 16 mg/dL (ref 6–20)
CO2: 22 mmol/L (ref 22–32)
Calcium: 8.9 mg/dL (ref 8.9–10.3)
Chloride: 102 mmol/L (ref 98–111)
Creatinine, Ser: 1.37 mg/dL — ABNORMAL HIGH (ref 0.44–1.00)
GFR, Estimated: 46 mL/min — ABNORMAL LOW (ref >60)
Glucose, Bld: 123 mg/dL — ABNORMAL HIGH (ref 70–99)
Potassium: 4.2 mmol/L (ref 3.5–5.1)
Sodium: 132 mmol/L — ABNORMAL LOW (ref 135–145)

## 2022-11-18 MED ORDER — SODIUM CHLORIDE 0.9 % IV BOLUS
1000.0000 mL | Freq: Once | INTRAVENOUS | Status: AC
  Administered 2022-11-18: 1000 mL via INTRAVENOUS

## 2022-11-18 MED ORDER — FENTANYL CITRATE PF 50 MCG/ML IJ SOSY
50.0000 ug | PREFILLED_SYRINGE | Freq: Once | INTRAMUSCULAR | Status: AC
  Administered 2022-11-18: 50 ug via INTRAVENOUS
  Filled 2022-11-18: qty 1

## 2022-11-18 MED ORDER — IOHEXOL 350 MG/ML SOLN
75.0000 mL | Freq: Once | INTRAVENOUS | Status: AC | PRN
  Administered 2022-11-18: 75 mL via INTRAVENOUS

## 2022-11-18 NOTE — Discharge Instructions (Addendum)
Have someone stay with you until you feel stable. Do not drive, operate machinery, or play sports until your caregiver says it is okay. Keep all follow-up appointments. Lie down right away if you start feeling like you might faint. Breathe deeply and steadily. Wait until all the symptoms have passed.Drink enough fluids to keep your urine clear or pale yellow. If you are taking blood pressure or heart medicine, get up slowly, taking several minutes to sit and then stand. This can reduce dizziness. SEEK IMMEDIATE MEDICAL CARE IF: You have a severe headache. You have unusual pain in the chest, abdomen, or back. You are bleeding from the mouth or rectum, or you have a black or tarry stool. You have an irregular or very fast heartbeat. You have pain with breathing. You have repeated fainting or seizure-like jerking during an episode. You faint when sitting or lying down. You have confusion. You have difficulty walking. You have severe weakness. You have vision problems. If you fainted, call your local emergency services - do not drive yourself to the hospital.   It was a pleasure caring for you today in the emergency department.  Please return to the emergency department for any worsening or worrisome symptoms.   Please use your nystatin powder on yeast infection to abdomen/skin; keep this area dry and follow up with your pcp for recheck

## 2022-11-18 NOTE — ED Provider Notes (Incomplete)
[place] EMERGENCY DEPARTMENT AT [facility] Provider Note  CSN: [id number] Arrival date & time: 11/18/22 1458  Chief Complaint(s) Loss of Consciousness, Hypotension, and Abdominal Pain  HPI Abigail Golden is a 57 y.o. 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  Patient reports that she has been having diarrhea over the past 2 weeks, nausea as well. Had emesis today at doctors office. Worseing RLQ pain over last 2 weeks, worsened in last 2 days, difficulty finding position of comfort, pain improves when she does not move but worsened with movement/straining/walking/having bowel movement. Compliant with her home medications, denies similar pain, no change w/ urine, no blood in urine. Non bloody stool and no melena.   She also has right lower quad abdominal pain, she has known Candida under her pannus in this area.  Has been using nystatin powder intermittently and cornstarch as directed by her PCP    Past Medical History Past Medical History:  Diagnosis Date   Allergy    Anxiety    Chronic bronchitis (HCC)    Depression    Headache    Hypoglycemia    Lower back pain    since fall ~ 2012 (05/25/2015)   Migraine    05/25/2015 "probably once/month"   Migraine headache    Pain in left hip    since fall ~ 2012 (05/25/2015)   Pneumonia "several times"   Patient Active Problem List   Diagnosis Date Noted   Goals of care, counseling/discussion 05/26/2022   Dehydration 08/29/2021   Chemotherapy-induced nausea and vomiting 08/29/2021   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) 05/03/2021   Lung mass 04/24/2021   Malignant neoplasm metastatic to brain (HCC) 04/23/2021   Chronic intractable headache  09/09/2018   Chronic migraine without aura without status migrainosus, not intractable 09/09/2018   Morbid obesity (HCC) 09/09/2018   Medication overuse headache 09/09/2018   Cholecystitis 05/25/2015   Home Medication(s) Prior to Admission medications   Medication Sig Start Date End Date Taking? Authorizing Provider  ondansetron (ZOFRAN) 4 MG tablet Take 1 tablet (4 mg total) by mouth every 4 (four) hours as needed for nausea or vomiting. 11/19/22  Yes Tanda Rockers A, DO  acetaminophen (TYLENOL) 500 MG tablet Take 500 mg by mouth every 6 (six) hours as needed for moderate pain or headache.    [provider]  albuterol (VENTOLIN HFA) 108 (90 Base) MCG/ACT inhaler Inhale 2 puffs into the lungs every 4 (four) hours as needed. 06/21/19   [provider]  ALPRAZolam Prudy Feeler) 0.25 MG tablet Take 0.25 mg by mouth every 4 (four) hours as needed for anxiety. for anxiety 07/16/18   [provider]  aspirin EC 81 MG tablet Take 81 mg by mouth daily. Swallow whole.    [provider]  citalopram (CELEXA) 10 MG tablet Take 10 mg by mouth at bedtime. 08/16/18   [provider]  clindamycin (CLEOCIN T) 1 % external solution Apply topically 2 (two) times daily. 07/10/21   Malachy Mood, MD  Diclofenac Potassium,Migraine, 50 MG PACK Take 1 each by mouth as needed. As needed for migraines    [provider]  diclofenac sodium (VOLTAREN) 1 % GEL Apply topically as needed. To affected area    [provider]  diphenhydrAMINE (BENADRYL) 25 MG tablet Take 25 mg by mouth See admin instructions. 25 mg at bedtime 25 mg as needed for itching    [provider]  Docusate Sodium (STOOL SOFTENER LAXATIVE PO) Take 1 tablet by mouth as needed.    [provider]  eletriptan (RELPAX) 20 MG tablet Take 20 mg by mouth as needed for headache (at onset of migraine.). May repeat in 2 hours if needed.    [provider]  fluconazole (DIFLUCAN) 200 MG  tablet Take 1 tablet (200 mg total) by mouth daily. Patient taking differently: Take 200 mg by mouth daily as needed. 10/09/21   Bruning, Ashlyn, PA-C  fluticasone (FLONASE) 50 MCG/ACT nasal spray Place 2 sprays into both nostrils daily as needed for allergies. 02/12/17   [provider]  hydrOXYzine (ATARAX/VISTARIL) 25 MG tablet Take 25 mg by mouth See admin instructions. 25 mg at bedtime 25 mg during the day as needed for itching/anxiety    [provider]  loratadine (CLARITIN) 10 MG tablet Take 10 mg by mouth daily.    [provider]  magic mouthwash (nystatin, lidocaine, diphenhydrAMINE, alum & mag hydroxide) suspension Swish and swallow 5 mls by mouth every 6 hours as needed 06/03/21   Malachy Mood, MD  magic mouthwash w/lidocaine SOLN Take 5 mLs by mouth every 6 hours as needed for mouth pain. Swish and Swallow 06/03/21   Malachy Mood, MD  montelukast (SINGULAIR) 10 MG tablet Take 10 mg by mouth at bedtime.    [provider]  NYSTATIN powder APPLY  POWDER TOPICALLY TO AFFECTED AREA THREE TIMES DAILY 05/29/22   Malachy Mood, MD  ondansetron (ZOFRAN-ODT) 4 MG disintegrating tablet DISSOLVE 1 TABLET IN MOUTH EVERY 8 HOURS AS NEEDED FOR NAUSEA AND VOMITING 05/06/22   Malachy Mood, MD  osimertinib mesylate (TAGRISSO) 80 MG tablet Take 1 tablet (80 mg total) by mouth daily. 09/26/22   Malachy Mood, MD  predniSONE (DELTASONE) 50 MG tablet Take 1 tablet (50 mg total) by mouth daily with breakfast. 10/14/22   Henreitta Leber, MD  prochlorperazine (COMPAZINE) 10 MG tablet TAKE 1 TABLET BY MOUTH EVERY 6 HOURS AS NEEDED FOR NAUSEA FOR VOMITING 01/14/22   Malachy Mood, MD  solifenacin (VESICARE) 10 MG tablet Take 10 mg by mouth every evening.    [provider]  zolpidem (AMBIEN) 10 MG tablet TAKE 1/2 TO 1 (ONE-HALF TO ONE) TABLET BY MOUTH AT BEDTIME AS NEEDED FOR SLEEP 10/02/22   Malachy Mood, MD                                                                                                                                     Past Surgical History Past Surgical History:  Procedure Laterality Date   ABDOMINAL HYSTERECTOMY  11/2002   "found benign tumors"   BRONCHIAL BIOPSY  04/24/2021   Procedure: BRONCHIAL BIOPSIES;  Surgeon: Josephine Igo,  DO;  Location: [facility];  Service: Pulmonary;;   BRONCHIAL BRUSHINGS  04/24/2021   Procedure: BRONCHIAL BRUSHINGS;  Surgeon: Josephine Igo, DO;  Location: [facility];  Service: Pulmonary;;   BRONCHIAL NEEDLE ASPIRATION BIOPSY  04/24/2021   Procedure: BRONCHIAL NEEDLE ASPIRATION BIOPSIES;  Surgeon: Josephine Igo, DO;  Location: [facility];  Service: Pulmonary;;   CHEST TUBE INSERTION  04/24/2021   Procedure: CHEST TUBE INSERTION;  Surgeon: Josephine Igo, DO;  Location: [facility];  Service: Pulmonary;;   CHOLECYSTECTOMY N/A 05/25/2015   Procedure: LAPAROSCOPIC CHOLECYSTECTOMY;  Surgeon: Axel Filler, MD;  Location: [facility] OR;  Service: General;  Laterality: N/A;   LAPAROSCOPIC CHOLECYSTECTOMY  05/25/2015   MENISCUS REPAIR Right 03/2017   VIDEO BRONCHOSCOPY WITH ENDOBRONCHIAL NAVIGATION N/A 04/24/2021   Procedure: VIDEO BRONCHOSCOPY WITH ENDOBRONCHIAL NAVIGATION;  Surgeon: Josephine Igo, DO;  Location: [facility];  Service: Pulmonary;  Laterality: N/A;   VIDEO BRONCHOSCOPY WITH RADIAL ENDOBRONCHIAL ULTRASOUND  04/24/2021   Procedure: VIDEO BRONCHOSCOPY WITH RADIAL ENDOBRONCHIAL ULTRASOUND;  Surgeon: Josephine Igo, DO;  Location: [facility];  Service: Pulmonary;;   Family History Family History  Problem Relation Age of Onset   Heart disease Mother    Phillips Odor White syndrome Father    Evelene Croon Parkinson White syndrome Paternal Grandmother    Diabetes Paternal Grandmother    Breast cancer Neg Hx     Social History Social History   Tobacco Use   Smoking status: Never   Smokeless tobacco: Never  Vaping Use   Vaping Use: Never used  Substance Use Topics   Alcohol use: Never   Drug use: Never    Allergies Celebrex [celecoxib] and Minocycline  Review of Systems Review of Systems  Constitutional:  Negative for chills and fever.  HENT:  Negative for facial swelling and trouble swallowing.   Eyes:  Negative for photophobia and visual disturbance.  Respiratory:  Negative for cough and shortness of breath.   Cardiovascular:  Negative for chest pain and palpitations.  Gastrointestinal:  Positive for abdominal pain. Negative for nausea and vomiting.  Endocrine: Negative for polydipsia and polyuria.  Genitourinary:  Negative for difficulty urinating and hematuria.  Musculoskeletal:  Negative for gait problem and joint swelling.  Skin:  Positive for rash. Negative for pallor.  Neurological:  Positive for syncope. Negative for headaches.  Psychiatric/Behavioral:  Negative for agitation and confusion.     Physical Exam Vital Signs  I have reviewed the triage vital signs BP 109/62   Pulse 70   Temp 97.9 F (36.6 C)   Resp 14   Ht 5' (1.524 m)   Wt 117.9 kg   SpO2 100%   BMI 50.78 kg/m  Physical Exam Vitals and nursing note reviewed.  Constitutional:      General: She is not in acute distress.    Appearance: Normal appearance. She is obese.  HENT:     Head: Normocephalic and atraumatic.     Right Ear: External ear normal.     Left Ear: External ear normal.     Nose: Nose normal.     Mouth/Throat:     Mouth: Mucous membranes are moist.  Eyes:     General: No scleral icterus.       Right eye: No discharge.        Left eye: No discharge.  Cardiovascular:     Rate and Rhythm: Normal rate and regular rhythm.     Pulses: Normal pulses.     Heart sounds: Normal heart  sounds.  Pulmonary:     Effort: Pulmonary effort is normal. No respiratory distress.     Breath sounds: Normal breath sounds.  Abdominal:     General: Abdomen is flat.     Tenderness: There is abdominal tenderness in the right lower quadrant.  Musculoskeletal:        General: Normal range of motion.      Cervical back: Normal range of motion.     Right lower leg: No edema.     Left lower leg: No edema.  Skin:    General: Skin is warm and dry.     Capillary Refill: Capillary refill takes less than 2 seconds.       Neurological:     Mental Status: She is alert and oriented to person, place, and time.     GCS: GCS eye subscore is 4. GCS verbal subscore is 5. GCS motor subscore is 6.  Psychiatric:        Mood and Affect: Mood normal.        Behavior: Behavior normal.     ED Results and Treatments Labs (all labs ordered are listed, but only abnormal results are displayed) Labs Reviewed  BASIC METABOLIC PANEL - Abnormal; Notable for the following components:      Result Value   Sodium 132 (*)    Glucose, Bld 123 (*)    Creatinine, Ser 1.37 (*)    GFR, Estimated 46 (*)    All other components within normal limits  URINALYSIS, ROUTINE W REFLEX MICROSCOPIC - Abnormal; Notable for the following components:   APPearance HAZY (*)    Protein, ur 30 (*)    Leukocytes,Ua TRACE (*)    Bacteria, UA FEW (*)    All other components within normal limits  CBC WITH DIFFERENTIAL/PLATELET - Abnormal; Notable for the following components:   WBC 2.7 (*)    RBC 5.24 (*)    Platelets 126 (*)    Lymphs Abs 0.5 (*)    All other components within normal limits  CBG MONITORING, ED  TROPONIN I (HIGH SENSITIVITY)  TROPONIN I (HIGH SENSITIVITY)                                                                                                                          Radiology CT Head Wo Contrast  Result Date: 11/18/2022 CLINICAL DATA:  Loss of consciousness, trauma. History of brain metastases. EXAM: CT HEAD WITHOUT CONTRAST TECHNIQUE: Contiguous axial images were obtained from the base of the skull through the vertex without intravenous contrast. RADIATION DOSE REDUCTION: This exam was performed according to the departmental dose-optimization program which includes automated exposure control, adjustment  of the mA and/or kV according to patient size and/or use of iterative reconstruction technique. COMPARISON:  Brain MRI 09/11/2018. FINDINGS: Brain: There is no acute intracranial hemorrhage, extra-axial fluid collection, or acute infarct. Parenchymal volume is normal. The ventricles are normal in size. Brandilynn Taormina-white differentiation is preserved The pituitary and suprasellar region are  normal. The known metastases in the left frontal and parietal enhancing lesions are not well seen on the current study. There is no mass effect or midline shift. Vascular: No hyperdense vessel or unexpected calcification. Skull: Normal. Negative for fracture or focal lesion. Sinuses/Orbits: The paranasal sinuses are clear. The globes and orbits are unremarkable. Other: None. IMPRESSION: 1. No acute intracranial pathology. 2. The known enhancing lesions in the left frontal and parietal lobes are not well seen on the current study. Electronically Signed   By: Lesia Hausen M.D.   On: 11/18/2022 20:15   CT Angio Chest PE W and/or Wo Contrast  Result Date: 11/18/2022 CLINICAL DATA:  Non-small cell lung cancer with ongoing treatments. Loss of consciousness and hypotension.  Abdominal pain. * Tracking Code: BO * EXAM: CT ANGIOGRAPHY CHEST CT ABDOMEN AND PELVIS WITH CONTRAST TECHNIQUE: Multidetector CT imaging of the chest was performed using the standard protocol during bolus administration of intravenous contrast. Multiplanar CT image reconstructions and MIPs were obtained to evaluate the vascular anatomy. Multidetector CT imaging of the abdomen and pelvis was performed using the standard protocol during bolus administration of intravenous contrast. RADIATION DOSE REDUCTION: This exam was performed according to the departmental dose-optimization program which includes automated exposure control, adjustment of the mA and/or kV according to patient size and/or use of iterative reconstruction technique. CONTRAST:  75mL OMNIPAQUE IOHEXOL 350  MG/ML SOLN COMPARISON:  Multiple exams, including 09/22/2022 FINDINGS: Body habitus reduces diagnostic sensitivity and specificity. CTA CHEST FINDINGS Cardiovascular: Heterogeneity in pulmonary arterial density is primarily ascribed to body habitus and artifact. No definite filling defect is identified in the pulmonary arterial tree to indicate pulmonary embolus. Mediastinum/Nodes: Unremarkable Lungs/Pleura: Atelectasis and radiation fibrosis of much of the left upper lobe posteriorly, no change from 09/22/2022. No new or progressive lesion identified. Musculoskeletal: Thoracic spondylosis. Review of the MIP images confirms the above findings. CT ABDOMEN and PELVIS FINDINGS Hepatobiliary: Prior cholecystectomy.  Otherwise unremarkable. Pancreas: Unremarkable Spleen: Unremarkable Adrenals/Urinary Tract: Unremarkable Stomach/Bowel: Unremarkable Vascular/Lymphatic: Unremarkable Reproductive: Uterus absent.  Adnexa unremarkable. Other: No supplemental non-categorized findings. Musculoskeletal: Lumbar spondylosis and degenerative disc disease. Small umbilical hernia contains adipose tissue. Review of the MIP images confirms the above findings. IMPRESSION: 1. No filling defect is identified in the pulmonary arterial tree to indicate pulmonary embolus. Mildly reduced sensitivity due to body habitus and artifact. 2. Radiation fibrosis and volume loss in the left upper lobe posteriorly, no change from 09/22/2022. 3. No acute findings in the abdomen/pelvis to explain the patient's abdominal pain. 4. Small umbilical hernia contains adipose tissue. 5. Lumbar spondylosis and degenerative disc disease. Electronically Signed   By: Gaylyn Rong M.D.   On: 11/18/2022 20:02   CT ABDOMEN PELVIS W CONTRAST  Result Date: 11/18/2022 CLINICAL DATA:  Non-small cell lung cancer with ongoing treatments. Loss of consciousness and hypotension.  Abdominal pain. * Tracking Code: BO * EXAM: CT ANGIOGRAPHY CHEST CT ABDOMEN AND PELVIS  WITH CONTRAST TECHNIQUE: Multidetector CT imaging of the chest was performed using the standard protocol during bolus administration of intravenous contrast. Multiplanar CT image reconstructions and MIPs were obtained to evaluate the vascular anatomy. Multidetector CT imaging of the abdomen and pelvis was performed using the standard protocol during bolus administration of intravenous contrast. RADIATION DOSE REDUCTION: This exam was performed according to the departmental dose-optimization program which includes automated exposure control, adjustment of the mA and/or kV according to patient size and/or use of iterative reconstruction technique. CONTRAST:  75mL OMNIPAQUE IOHEXOL 350 MG/ML SOLN COMPARISON:  Multiple exams, including 09/22/2022 FINDINGS: Body habitus reduces diagnostic sensitivity and specificity. CTA CHEST FINDINGS Cardiovascular: Heterogeneity in pulmonary arterial density is primarily ascribed to body habitus and artifact. No definite filling defect is identified in the pulmonary arterial tree to indicate pulmonary embolus. Mediastinum/Nodes: Unremarkable Lungs/Pleura: Atelectasis and radiation fibrosis of much of the left upper lobe posteriorly, no change from 09/22/2022. No new or progressive lesion identified. Musculoskeletal: Thoracic spondylosis. Review of the MIP images confirms the above findings. CT ABDOMEN and PELVIS FINDINGS Hepatobiliary: Prior cholecystectomy.  Otherwise unremarkable. Pancreas: Unremarkable Spleen: Unremarkable Adrenals/Urinary Tract: Unremarkable Stomach/Bowel: Unremarkable Vascular/Lymphatic: Unremarkable Reproductive: Uterus absent.  Adnexa unremarkable. Other: No supplemental non-categorized findings. Musculoskeletal: Lumbar spondylosis and degenerative disc disease. Small umbilical hernia contains adipose tissue. Review of the MIP images confirms the above findings. IMPRESSION: 1. No filling defect is identified in the pulmonary arterial tree to indicate pulmonary  embolus. Mildly reduced sensitivity due to body habitus and artifact. 2. Radiation fibrosis and volume loss in the left upper lobe posteriorly, no change from 09/22/2022. 3. No acute findings in the abdomen/pelvis to explain the patient's abdominal pain. 4. Small umbilical hernia contains adipose tissue. 5. Lumbar spondylosis and degenerative disc disease. Electronically Signed   By: Gaylyn Rong M.D.   On: 11/18/2022 20:02    Pertinent labs & imaging results that were available during my care of the patient were reviewed by me and considered in my medical decision making (see MDM for details).  Medications Ordered in ED Medications  sodium chloride 0.9 % bolus 1,000 mL (0 mLs Intravenous Stopped 11/18/22 1759)  fentaNYL (SUBLIMAZE) injection 50 mcg (50 mcg Intravenous Given 11/18/22 1821)  iohexol (OMNIPAQUE) 350 MG/ML injection 75 mL (75 mLs Intravenous Contrast Given 11/18/22 1927)                                                                                                                                     Procedures Procedures  (including critical care time)  Medical Decision Making / ED Course    Medical Decision Making:    PREZLEY OFFUTT is a 57 y.o. 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  . The complaint involves an extensive differential diagnosis and also carries with it a high risk of complications and morbidity.  Serious etiology was considered. Ddx includes but is not limited to: Differential diagnosis includes but is not exclusive to ectopic pregnancy, ovarian cyst, ovarian torsion, acute appendicitis, urinary tract infection, endometriosis, bowel obstruction, hernia, colitis, renal colic,  gastroenteritis, volvulus, simple syncope, vasovagal syncope, cardiac syncope, medication effect etc.   Complete initial physical exam performed, notably the patient  was no acute distress, resting company in ambient air abdomen is  not peritoneal.    Reviewed and confirmed nursing documentation for past medical history, family history, social history.  Vital signs reviewed.    Clinical Course as of 11/19/22 0022  Tue Nov 18, 2022  1659 Creatinine(!): 1.37 Similar to prior  [SG]  2134 Orthostatics neg [SG]  2258 Feeling better, imaging stable, labs stable [SG]    Clinical Course User Index [SG] Sloan Leiter, DO    On arrival patient reports she is feeling better.  She had a prodrome of lightheadedness, diaphoresis, vision changes prior to syncopal episode at her doctor's office.  Unsure if she hit her head.  Feels back to baseline this time.  No seizure activity was observed by bystanders.  No confusion following episode.  Brief LOC less than 30 seconds.  Denies Chest pain or dyspnea prior to or following the episode.  Patient reports that she has had poor p.o. intake over the past few weeks secondary to nausea in addition to having diarrhea that she has had chronically is adverse effect of her chemotherapy.  Workup today is stable, creatinine similar to her baseline.  Urinalysis stable.  Orthostatics were negative.  Imaging is stable.  Low suspicion for cardiac syncope.  No arrhythmias on telemetry and EKG stable.  Troponin negative.  She did have prodrome for her syncope.  Story is concerning for possible vasovagal syncope in conjunction with poor p.o. intake.  Is ambulatory at her typical level at this time.  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  She has nystatin powder at home which she will start using and will follow-up with her PCP, instructed to keep this area dry.  This may be source of her abdominal discomfort.  Imaging is stable without  acute abnormality to explain her pain.  [place] syncope score is low risk for this patient  Encourage close outpatient follow-up with her PCP and oncology  The patient improved significantly and was discharged in stable condition. Detailed discussions were had with the patient regarding current findings, and need for close f/u with PCP or on call doctor. The patient has been instructed to return immediately if the symptoms worsen in any way for re-evaluation. Patient verbalized understanding and is in agreement with current care plan. All questions answered prior to discharge.     Additional history obtained: -Additional history obtained from friend -External records from outside source obtained and reviewed including: Chart review including previous notes, labs, imaging, consultation notes including medications, prior labs and imaging, primary care documentation   Lab Tests: -I ordered, reviewed, and interpreted labs.   The pertinent results include:   Labs Reviewed  BASIC METABOLIC PANEL - Abnormal; Notable for the following components:      Result Value   Sodium 132 (*)    Glucose, Bld 123 (*)    Creatinine, Ser 1.37 (*)    GFR, Estimated 46 (*)    All other components within normal limits  URINALYSIS, ROUTINE W REFLEX MICROSCOPIC - Abnormal; Notable for the following components:   APPearance HAZY (*)    Protein, ur 30 (*)    Leukocytes,Ua TRACE (*)    Bacteria, UA FEW (*)    All other components within normal limits  CBC WITH DIFFERENTIAL/PLATELET - Abnormal; Notable for the following components:   WBC 2.7 (*)    RBC 5.24 (*)    Platelets 126 (*)    Lymphs Abs 0.5 (*)    All other components within normal limits  CBG MONITORING, ED  TROPONIN I (HIGH SENSITIVITY)  TROPONIN I (HIGH SENSITIVITY)    Notable for labs stable  EKG   EKG Interpretation  Date/Time:  Tuesday Nov 18 2022 15:06:56 EDT Ventricular Rate:  67 PR Interval:  127 QRS Duration: 92 QT  Interval:  416 QTC Calculation: 440 R Axis:   57 Text Interpretation: Sinus rhythm no stemi similar to prior Confirmed by Tanda Rockers ([id number]) on 11/18/2022 3:08:00 PM         Imaging Studies ordered: I ordered imaging studies including the head, CT PE, CT abdomen I independently visualized the following imaging with scope of interpretation limited to determining acute life threatening conditions related to emergency care; findings noted above, significant for no large volume PE, no ICH, stable imaging I independently visualized and interpreted imaging. I agree with the radiologist interpretation   Medicines ordered and prescription drug management: Meds ordered this encounter  Medications   sodium chloride 0.9 % bolus 1,000 mL   fentaNYL (SUBLIMAZE) injection 50 mcg   iohexol (OMNIPAQUE) 350 MG/ML injection 75 mL   ondansetron (ZOFRAN) 4 MG tablet    Sig: Take 1 tablet (4 mg total) by mouth every 4 (four) hours as needed for nausea or vomiting.    Dispense:  12 tablet    Refill:  0    -I have reviewed the patients home medicines and have made adjustments as needed   Consultations Obtained: na   Cardiac Monitoring: The patient was maintained on a cardiac monitor.  I personally viewed and interpreted the cardiac monitored which showed an underlying rhythm of: NSR  Social Determinants of Health:  Diagnosis or treatment significantly limited by social determinants of health: obesity   Reevaluation: After the interventions noted above, I reevaluated the patient and found that they have improved  Co morbidities that complicate the patient evaluation  Past Medical History:  Diagnosis Date   Allergy    Anxiety    Chronic bronchitis (HCC)    Depression    Headache    Hypoglycemia    Lower back pain    since fall ~ 2012 (05/25/2015)   Migraine    05/25/2015 "probably once/month"   Migraine headache    Pain in left hip    since fall ~ 2012 (05/25/2015)   Pneumonia  "several times"      Dispostion: Disposition decision including need for hospitalization was considered, and patient discharged from emergency department.    Final Clinical Impression(s) / ED Diagnoses Final diagnoses:  Vasovagal syncope  Abdominal pain, unspecified abdominal location  Yeast infection of the skin  Fall, initial encounter     This chart was dictated using voice recognition software.  Despite best efforts to proofread,  errors can occur which can change the documentation meaning.    Sloan Leiter, DO 11/19/22 0022    Sloan Leiter, DO 11/19/22 Rich Fuchs

## 2022-11-18 NOTE — ED Triage Notes (Signed)
Pt BIBGEMS from doctors office for syncopal episode, unsure length of time. Pt reports feeling hot and dizzy calling for help before passing out. Pt BP 70/50 then last 90/48. Pt upon arrival to room cool and clammy, diaphoretic, and pale. C/o lower abdominal pain which was her main concern for going to the PCP, pain is in the lower right quadrant with pressure. Pt did have one episode of emesis and received 4 mg of Zofran with EMS  RR 18 CBG 119 Oxygen 98% RA  Hx cancer - lung and brain, on chemo pills

## 2022-11-18 NOTE — ED Notes (Signed)
Pt ambulated to restroom with minimal assistance.

## 2022-11-18 NOTE — ED Notes (Signed)
Patient able to tolerate eating and drinking with no nausea or vomiting.

## 2022-11-19 MED ORDER — ONDANSETRON HCL 4 MG PO TABS: 4.0000 mg | ORAL_TABLET | ORAL | 0 refills | Status: DC | PRN

## 2022-11-26 ENCOUNTER — Ambulatory Visit: Payer: BC Managed Care – PPO | Admitting: Hematology

## 2022-11-26 ENCOUNTER — Other Ambulatory Visit: Payer: BC Managed Care – PPO

## 2022-11-28 ENCOUNTER — Inpatient Hospital Stay: Payer: BC Managed Care – PPO | Admitting: Hematology

## 2022-11-28 ENCOUNTER — Other Ambulatory Visit: Payer: Self-pay

## 2022-11-28 ENCOUNTER — Encounter: Payer: Self-pay | Admitting: Hematology

## 2022-11-28 ENCOUNTER — Inpatient Hospital Stay: Payer: BC Managed Care – PPO | Attending: Hematology

## 2022-11-28 VITALS — BP 117/74 | HR 77 | Temp 97.8°F | Resp 18 | Ht 60.0 in | Wt 262.5 lb

## 2022-11-28 DIAGNOSIS — Z7952 Long term (current) use of systemic steroids: Secondary | ICD-10-CM | POA: Insufficient documentation

## 2022-11-28 DIAGNOSIS — Z923 Personal history of irradiation: Secondary | ICD-10-CM | POA: Diagnosis not present

## 2022-11-28 DIAGNOSIS — C7931 Secondary malignant neoplasm of brain: Secondary | ICD-10-CM | POA: Insufficient documentation

## 2022-11-28 DIAGNOSIS — R110 Nausea: Secondary | ICD-10-CM | POA: Insufficient documentation

## 2022-11-28 DIAGNOSIS — R197 Diarrhea, unspecified: Secondary | ICD-10-CM | POA: Diagnosis not present

## 2022-11-28 DIAGNOSIS — C3412 Malignant neoplasm of upper lobe, left bronchus or lung: Secondary | ICD-10-CM | POA: Diagnosis present

## 2022-11-28 DIAGNOSIS — Z7982 Long term (current) use of aspirin: Secondary | ICD-10-CM | POA: Insufficient documentation

## 2022-11-28 DIAGNOSIS — Z79899 Other long term (current) drug therapy: Secondary | ICD-10-CM | POA: Diagnosis not present

## 2022-11-28 LAB — CBC WITH DIFFERENTIAL (CANCER CENTER ONLY)
Abs Immature Granulocytes: 0.01 10*3/uL (ref 0.00–0.07)
Basophils Absolute: 0 10*3/uL (ref 0.0–0.1)
Basophils Relative: 0 %
Eosinophils Absolute: 0 10*3/uL (ref 0.0–0.5)
Eosinophils Relative: 0 %
HCT: 33.4 % — ABNORMAL LOW (ref 36.0–46.0)
Hemoglobin: 11.8 g/dL — ABNORMAL LOW (ref 12.0–15.0)
Immature Granulocytes: 0 %
Lymphocytes Relative: 27 %
Lymphs Abs: 0.9 10*3/uL (ref 0.7–4.0)
MCH: 29.4 pg (ref 26.0–34.0)
MCHC: 35.3 g/dL (ref 30.0–36.0)
MCV: 83.1 fL (ref 80.0–100.0)
Monocytes Absolute: 0.5 10*3/uL (ref 0.1–1.0)
Monocytes Relative: 16 %
Neutro Abs: 1.8 10*3/uL (ref 1.7–7.7)
Neutrophils Relative %: 57 %
Platelet Count: 158 10*3/uL (ref 150–400)
RBC: 4.02 MIL/uL (ref 3.87–5.11)
RDW: 13.2 % (ref 11.5–15.5)
WBC Count: 3.2 10*3/uL — ABNORMAL LOW (ref 4.0–10.5)
nRBC: 0 % (ref 0.0–0.2)

## 2022-11-28 LAB — CMP (CANCER CENTER ONLY)
ALT: 12 U/L (ref 0–44)
AST: 16 U/L (ref 15–41)
Albumin: 4.2 g/dL (ref 3.5–5.0)
Alkaline Phosphatase: 76 U/L (ref 38–126)
Anion gap: 6 (ref 5–15)
BUN: 19 mg/dL (ref 6–20)
CO2: 27 mmol/L (ref 22–32)
Calcium: 9.4 mg/dL (ref 8.9–10.3)
Chloride: 105 mmol/L (ref 98–111)
Creatinine: 1.31 mg/dL — ABNORMAL HIGH (ref 0.44–1.00)
GFR, Estimated: 48 mL/min — ABNORMAL LOW (ref >60)
Glucose, Bld: 101 mg/dL — ABNORMAL HIGH (ref 70–99)
Potassium: 4.5 mmol/L (ref 3.5–5.1)
Sodium: 138 mmol/L (ref 135–145)
Total Bilirubin: 0.5 mg/dL (ref 0.3–1.2)
Total Protein: 7.2 g/dL (ref 6.5–8.1)

## 2022-11-28 MED ORDER — NYSTATIN 100000 UNIT/GM EX POWD: CUTANEOUS | 1 refills | Status: DC

## 2022-11-28 MED ORDER — ONDANSETRON 4 MG PO TBDP: ORAL_TABLET | ORAL | 1 refills | Status: DC

## 2022-11-28 NOTE — Assessment & Plan Note (Signed)
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 She tolerates moderately well with mucositis and skin toxicities.   -she had thoracic radiation (The primary tumor and involved mediastinal adenopathy were treated to 66 Gy in 33 fractions of 2 Gy), completed on 11/04/2021 -I personally reviewed her recent restaging CT scan from May 22, 2022, which showed continued response in the primary site and thoracic adenopathy, or new lesions. -She is clinically doing better, lives independently, tolerating Tagrisso well, will continue. -Restaging brain MRI from December 21st 2023 showed a stable brain lesions. - restaging CT from 09/23/2022 showed area of chronic postradiation mass-like fibrosis in the posteromedial aspect of the left upper lung, without definitive findings of locally recurrent disease or definite metastatic disease in the chest, abdomen or pelvis

## 2022-11-28 NOTE — Assessment & Plan Note (Signed)
-  From her lung cancer, status post brain radiation twice  -f/u with rad/onc and Dr. Barbaraann Cao -next brain MRI scheduled for 12/16/22

## 2022-11-28 NOTE — Progress Notes (Signed)
[facility]   [phone] Fax:([phone]   Clinic Follow up Note   Patient Care Team: Richmond Campbell., PA-C as PCP - General (Family Medicine) Malachy Mood, MD as Consulting Physician (Hematology and Oncology) Margaretmary Dys, MD as Consulting Physician (Radiation Oncology)  Date of Service:  11/28/2022  CHIEF COMPLAINT: f/u of  metastatic lung cancer   CURRENT THERAPY:  Tagrisso, 80 mg daily, started 05/22/21    ASSESSMENT:  Abigail Golden is a 57 y.o. 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 She tolerates moderately well with mucositis and skin toxicities.   -she had thoracic radiation (The primary tumor and involved mediastinal adenopathy were treated to 66 Gy in 33 fractions of 2 Gy), completed on 11/04/2021 -I personally reviewed her recent restaging CT scan from May 22, 2022, which showed continued response in the primary site and thoracic adenopathy, or new lesions. -She is clinically doing better, lives independently, tolerating Tagrisso well, will continue. -Restaging brain MRI from December 21st 2023 showed a stable brain lesions. - restaging CT from 09/23/2022 showed area of chronic postradiation mass-like fibrosis in the posteromedial aspect of the left upper lung, without definitive findings of locally recurrent disease or definite metastatic disease in the chest, abdomen or pelvis  Malignant neoplasm metastatic to brain [facility]) -From her lung cancer, status post brain radiation twice  -f/u with rad/onc and Dr. Barbaraann Cao -next brain MRI scheduled for 12/16/22  Nausea and diarrhea  -she had an episode of nausea, diarrhea, and poor appetite a few weeks ago, she ended up in the hospital after syncope from dehydration. -She overall has feel much better this week, she is able to eat  better.  Her weight is stable.  -I reviewed her CT scan from Nov 18, 2022 during her ED visit, which showed no acute process, her previous radiation change in left lung has improved.  No concern for cancer progression.   PLAN: -lab reviewed. -Repeat scan in September. 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 Metastatic disease would be the most likely cause of this appearance. Other possibilities include venous infarctions and cerebritis. MRI with contrast recommended.    04/23/2021 Imaging   MR Brain W and Wo Contrast  IMPRESSION: 1. Metastatic disease to the brain. Two enhancing brain masses with moderate associated vasogenic edema: solid 16 mm left parietal lobe mass, and a larger 29 mm rim enhancing cystic or necrotic mass in the left inferior frontal gyrus. Two other small 2-3 mm metastases in the right inferior frontal gyrus, right posterior temporal lobe. And questionable two additional punctate metastases in both superior frontal gyri abutting the falx (series 18, image 46). 2. No significant intracranial mass effect. No other intracranial  abnormality.   04/23/2021 Imaging   CT Chest W Contrast  IMPRESSION: 4.5 cm spiculated left upper lobe mass most compatible with primary lung cancer. This abuts the medial pleural surface. A few small adjacent pre-vascular mediastinal lymph nodes, none  pathologically enlarged. Recommend cardiothoracic surgical and oncologic consultation.   No acute findings or evidence of metastatic disease in the abdomen or pelvis.   04/24/2021 Cancer Staging   Staging form: Lung, AJCC 8th Edition - Clinical stage from 04/24/2021: Stage IVA (cT2, cN2, pM1b) - Signed by Malachy Mood, MD on 05/14/2021   04/24/2021 Initial Biopsy   FINAL MICROSCOPIC DIAGNOSIS:   A. LUNG, LUL, FINE NEEDLE ASPIRATION:  - Malignant cells present, consistent with adenocarcinoma   B. LUNG, LUL, BRUSHING:  - Malignant cells consistent with adenocarcinoma, see comment    COMMENT:  B.  Immunohistochemical stains show that the tumor cells are positive for TTF-1 while they are negative for p63 and CK5/6, consistent with above interpretation. The number of tumor cells appears to be insufficient for molecular studies.   05/03/2021 Initial Diagnosis   Lung cancer (HCC)   05/08/2021 - 05/13/2021 Radiation Therapy   SRS treatment to the metastatic brain lesions under the care of Dr. Kathrynn Running    08/05/2021 Imaging   EXAM: CT CHEST, ABDOMEN, AND PELVIS WITH CONTRAST  IMPRESSION: 1. No substantial change in size of the left upper lobe pulmonary lesion. 2. Borderline to mildly enlarged adjacent prevascular/AP window lymph nodes have increased in size in the interval. This finding raises concern for disease progression. 3. Interval development of upper normal bilateral common iliac and left external iliac lymph nodes. Close attention on follow-up recommended. 4. No other evidence for metastatic disease in the abdomen or pelvis.   09/22/2022 Imaging    IMPRESSION: 1. Area of chronic postradiation mass-like fibrosis in the posteromedial aspect of the left upper lung, without definitive findings of locally recurrent disease or definite metastatic disease in the chest, abdomen or pelvis. 2. Mild atherosclerosis. 3. Additional incidental findings, as above.        INTERVAL  HISTORY:  Abigail Golden is here for a follow up of  metastatic lung cancer. She was last seen by me on 09/26/2022. She presents to the clinic accompanied by friend. Pt state that she had some joint pain, she had some nausea.and diarrhea., she stated that she never vomited.Pt went to the doctor and fainted her BP was  73/47., she was dehydrated.Pt report of not having a lot of headaches, which is good.      All other systems were reviewed with the patient and are negative.  MEDICAL HISTORY:  Past Medical History:  Diagnosis Date   Allergy    Anxiety    Chronic bronchitis (HCC)    Depression    Headache    Hypoglycemia    Lower back pain    since fall ~ 2012 (05/25/2015)   Migraine    05/25/2015 "probably once/month"   Migraine headache    Pain in left hip    since fall ~ 2012 (05/25/2015)   Pneumonia "several times"    SURGICAL HISTORY: Past Surgical History:  Procedure Laterality Date   ABDOMINAL HYSTERECTOMY  11/2002   "found benign tumors"   BRONCHIAL BIOPSY  04/24/2021   Procedure: BRONCHIAL BIOPSIES;  Surgeon: Josephine Igo, DO;  Location: [facility];  Service: Pulmonary;;   BRONCHIAL BRUSHINGS  04/24/2021   Procedure: BRONCHIAL BRUSHINGS;  Surgeon: Josephine Igo, DO;  Location: [facility];  Service:  Pulmonary;;   BRONCHIAL NEEDLE ASPIRATION BIOPSY  04/24/2021   Procedure: BRONCHIAL NEEDLE ASPIRATION BIOPSIES;  Surgeon: Josephine Igo, DO;  Location: [facility];  Service: Pulmonary;;   CHEST TUBE INSERTION  04/24/2021   Procedure: CHEST TUBE INSERTION;  Surgeon: Josephine Igo, DO;  Location: [facility];  Service: Pulmonary;;   CHOLECYSTECTOMY N/A 05/25/2015   Procedure: LAPAROSCOPIC CHOLECYSTECTOMY;  Surgeon: Axel Filler, MD;  Location: [facility] OR;  Service: General;  Laterality: N/A;   LAPAROSCOPIC CHOLECYSTECTOMY  05/25/2015   MENISCUS REPAIR Right 03/2017   VIDEO BRONCHOSCOPY WITH ENDOBRONCHIAL NAVIGATION N/A 04/24/2021   Procedure: VIDEO BRONCHOSCOPY WITH  ENDOBRONCHIAL NAVIGATION;  Surgeon: Josephine Igo, DO;  Location: [facility];  Service: Pulmonary;  Laterality: N/A;   VIDEO BRONCHOSCOPY WITH RADIAL ENDOBRONCHIAL ULTRASOUND  04/24/2021   Procedure: VIDEO BRONCHOSCOPY WITH RADIAL ENDOBRONCHIAL ULTRASOUND;  Surgeon: Josephine Igo, DO;  Location: [facility];  Service: Pulmonary;;    I have reviewed the social history and family history with the patient and they are unchanged from previous note.  ALLERGIES:  is allergic to celebrex [celecoxib] and minocycline.  MEDICATIONS:  Current Outpatient Medications  Medication Sig Dispense Refill   acetaminophen (TYLENOL) 500 MG tablet Take 500 mg by mouth every 6 (six) hours as needed for moderate pain or headache.     albuterol (VENTOLIN HFA) 108 (90 Base) MCG/ACT inhaler Inhale 2 puffs into the lungs every 4 (four) hours as needed.     ALPRAZolam (XANAX) 0.25 MG tablet Take 0.25 mg by mouth every 4 (four) hours as needed for anxiety. for anxiety     aspirin EC 81 MG tablet Take 81 mg by mouth daily. Swallow whole.     citalopram (CELEXA) 10 MG tablet Take 10 mg by mouth at bedtime.     clindamycin (CLEOCIN T) 1 % external solution Apply topically 2 (two) times daily. 30 mL 0   Diclofenac Potassium,Migraine, 50 MG PACK Take 1 each by mouth as needed. As needed for migraines     diclofenac sodium (VOLTAREN) 1 % GEL Apply topically as needed. To affected area     diphenhydrAMINE (BENADRYL) 25 MG tablet Take 25 mg by mouth See admin instructions. 25 mg at bedtime 25 mg as needed for itching     Docusate Sodium (STOOL SOFTENER LAXATIVE PO) Take 1 tablet by mouth as needed.     eletriptan (RELPAX) 20 MG tablet Take 20 mg by mouth as needed for headache (at onset of migraine.). May repeat in 2 hours if needed.     fluconazole (DIFLUCAN) 200 MG tablet Take 1 tablet (200 mg total) by mouth daily. (Patient taking differently: Take 200 mg by mouth daily as needed.) 14 tablet 0   fluticasone (FLONASE)  50 MCG/ACT nasal spray Place 2 sprays into both nostrils daily as needed for allergies.     hydrOXYzine (ATARAX/VISTARIL) 25 MG tablet Take 25 mg by mouth See admin instructions. 25 mg at bedtime 25 mg during the day as needed for itching/anxiety     loratadine (CLARITIN) 10 MG tablet Take 10 mg by mouth daily.     magic mouthwash (nystatin, lidocaine, diphenhydrAMINE, alum & mag hydroxide) suspension Swish and swallow 5 mls by mouth every 6 hours as needed 250 mL 1   magic mouthwash w/lidocaine SOLN Take 5 mLs by mouth every 6 hours as needed for mouth pain. Swish and Swallow 250 mL 1   montelukast (SINGULAIR) 10 MG tablet Take 10 mg by mouth at bedtime.  nystatin powder APPLY  POWDER TOPICALLY TO AFFECTED AREA THREE TIMES DAILY 60 g 1   ondansetron (ZOFRAN) 4 MG tablet Take 1 tablet (4 mg total) by mouth every 4 (four) hours as needed for nausea or vomiting. 12 tablet 0   ondansetron (ZOFRAN-ODT) 4 MG disintegrating tablet DISSOLVE 1 TABLET IN MOUTH EVERY 8 HOURS AS NEEDED FOR NAUSEA AND VOMITING 18 tablet 1   osimertinib mesylate (TAGRISSO) 80 MG tablet Take 1 tablet (80 mg total) by mouth daily. 30 tablet 3   predniSONE (DELTASONE) 50 MG tablet Take 1 tablet (50 mg total) by mouth daily with breakfast. 7 tablet 0   prochlorperazine (COMPAZINE) 10 MG tablet TAKE 1 TABLET BY MOUTH EVERY 6 HOURS AS NEEDED FOR NAUSEA FOR VOMITING 30 tablet 1   solifenacin (VESICARE) 10 MG tablet Take 10 mg by mouth every evening.     zolpidem (AMBIEN) 10 MG tablet TAKE 1/2 TO 1 (ONE-HALF TO ONE) TABLET BY MOUTH AT BEDTIME AS NEEDED FOR SLEEP 30 tablet 0   No current facility-administered medications for this visit.    PHYSICAL EXAMINATION: ECOG PERFORMANCE STATUS: 1 - Symptomatic but completely ambulatory  Vitals:   11/28/22 1408  BP: 117/74  Pulse: 77  Resp: 18  Temp: 97.8 F (36.6 C)  SpO2: 98%   Wt Readings from Last 3 Encounters:  11/28/22 262 lb 8 oz (119.1 kg)  11/18/22 260 lb (117.9 kg)   10/14/22 258 lb 3.2 oz (117.1 kg)     GENERAL:alert, no distress and comfortable SKIN: skin color normal, no rashes or significant lesions EYES: normal, Conjunctiva are pink and non-injected, sclera clear  NEURO: alert & oriented x 3 with fluent speech LYMPH: (-) no palpable lymphadenopathy in the cervical, axillary  LUNGS:(-) clear to auscultation and percussion with normal breathing effort HEART: (-) regular rate & rhythm and no murmurs and no lower extremity edema ABDOMEN:(-)abdomen soft, (-) non-tender and (-) normal bowel sounds Musculoskeletal:no cyanosis of digits and no clubbing  NEURO: alert & oriented x 3 with fluent speech, no focal motor/sensory deficits  LABORATORY DATA:  I have reviewed the data as listed    Latest Ref Rng & Units 11/28/2022    1:50 PM 11/18/2022    3:11 PM 09/22/2022    1:57 PM  CBC  WBC 4.0 - 10.5 K/uL 3.2  2.7  2.9   Hemoglobin 12.0 - 15.0 g/dL 16.1  09.6  04.5   Hematocrit 36.0 - 46.0 % 33.4  44.9  33.5   Platelets 150 - 400 K/uL 158  126  157         Latest Ref Rng & Units 11/28/2022    1:50 PM 11/18/2022    3:11 PM 09/22/2022    1:57 PM  CMP  Glucose 70 - 99 mg/dL 409  811  87   BUN 6 - 20 mg/dL 19  16  20    Creatinine 0.44 - 1.00 mg/dL 9.14  7.82  9.56   Sodium 135 - 145 mmol/L 138  132  138   Potassium 3.5 - 5.1 mmol/L 4.5  4.2  4.6   Chloride 98 - 111 mmol/L 105  102  104   CO2 22 - 32 mmol/L 27  22  26    Calcium 8.9 - 10.3 mg/dL 9.4  8.9  9.7   Total Protein 6.5 - 8.1 g/dL 7.2   7.5   Total Bilirubin 0.3 - 1.2 mg/dL 0.5   0.4   Alkaline Phos 38 - 126 U/L  76   77   AST 15 - 41 U/L 16   19   ALT 0 - 44 U/L 12   15       RADIOGRAPHIC STUDIES: I have personally reviewed the radiological images as listed and agreed with the findings in the report. No results found.    No orders of the defined types were placed in this encounter.  All questions were answered. The patient knows to call the clinic with any problems, questions or  concerns. No barriers to learning was detected. The total time spent in the appointment was 25 minutes.     Malachy Mood, MD 11/28/2022   Carolin Coy, CMA, am acting as scribe for Malachy Mood, MD.   I have reviewed the above documentation for accuracy and completeness, and I agree with the above.

## 2022-12-01 ENCOUNTER — Telehealth: Payer: Self-pay | Admitting: Hematology

## 2022-12-01 NOTE — Telephone Encounter (Signed)
Called twice, left a message on patient's voicemail regarding next upcoming appointments

## 2022-12-09 ENCOUNTER — Other Ambulatory Visit (HOSPITAL_COMMUNITY): Payer: Self-pay

## 2022-12-16 ENCOUNTER — Other Ambulatory Visit (HOSPITAL_COMMUNITY): Payer: Self-pay

## 2022-12-16 ENCOUNTER — Ambulatory Visit (HOSPITAL_COMMUNITY)
Admission: RE | Admit: 2022-12-16 | Discharge: 2022-12-16 | Disposition: A | Discharge status: Home / Self Care | Payer: BC Managed Care – PPO | Source: Ambulatory Visit | Attending: Radiation Oncology | Admitting: Radiation Oncology

## 2022-12-16 DIAGNOSIS — C7931 Secondary malignant neoplasm of brain: Secondary | ICD-10-CM | POA: Diagnosis present

## 2022-12-16 DIAGNOSIS — C7949 Secondary malignant neoplasm of other parts of nervous system: Secondary | ICD-10-CM | POA: Insufficient documentation

## 2022-12-16 MED ORDER — GADOBUTROL 1 MMOL/ML IV SOLN: 9.0000 mL | Freq: Once | INTRAVENOUS | Status: DC | PRN

## 2022-12-16 MED ORDER — GADOBUTROL 1 MMOL/ML IV SOLN
10.0000 mL | Freq: Once | INTRAVENOUS | Status: AC | PRN
  Administered 2022-12-16: 10 mL via INTRAVENOUS

## 2022-12-17 ENCOUNTER — Telehealth: Payer: Self-pay | Admitting: Radiation Therapy

## 2022-12-17 NOTE — Telephone Encounter (Signed)
Spoke with Abigail Golden about moving her follow-up appointment to Monday 7/1 to allow the scan to be fused with her previous treatment course and presented in the next brain and spine conference prior to her visit.  [name] R.T.(R)(T) Radiation Special Procedures Navigator

## 2022-12-18 ENCOUNTER — Ambulatory Visit: Payer: BC Managed Care – PPO | Admitting: Urology

## 2022-12-18 ENCOUNTER — Telehealth: Payer: Self-pay | Admitting: Radiation Therapy

## 2022-12-18 NOTE — Telephone Encounter (Signed)
I called Abigail Golden for updates after the results of the requested fusion. She was thankful for the good news and the call.  -------------------------------------------------------  There was a question of a new punctate lesion in the high left frontal lobe, vs a small vessel demonstrated on the 6/25 brain MRI. A fusion of her previous treatment targets with the brain MRI was requested. This confirmed that the area in question has been previously treated. No intervention is needed at this time. We will plan for another brain MRI in 3 months for follow-up.   Abigail Golden is feeling great, no complaints or issues. She is preparing to go on a mission trip to [place] with her church and very excited.  She has my contact information if she has any other questions.   [name] R.T.(R)(T) Radiation Special Procedures Navigator

## 2022-12-22 ENCOUNTER — Ambulatory Visit: Payer: BC Managed Care – PPO | Admitting: Radiation Oncology

## 2022-12-23 ENCOUNTER — Other Ambulatory Visit: Payer: Self-pay | Admitting: Radiation Therapy

## 2022-12-23 DIAGNOSIS — C7931 Secondary malignant neoplasm of brain: Secondary | ICD-10-CM

## 2023-01-07 ENCOUNTER — Ambulatory Visit: Payer: BC Managed Care – PPO | Admitting: Hematology

## 2023-01-07 ENCOUNTER — Other Ambulatory Visit: Payer: BC Managed Care – PPO

## 2023-01-08 ENCOUNTER — Other Ambulatory Visit (HOSPITAL_COMMUNITY): Payer: Self-pay

## 2023-01-09 ENCOUNTER — Other Ambulatory Visit (HOSPITAL_COMMUNITY): Payer: Self-pay

## 2023-01-16 ENCOUNTER — Other Ambulatory Visit: Payer: Self-pay | Admitting: Radiation Therapy

## 2023-02-04 ENCOUNTER — Other Ambulatory Visit (HOSPITAL_COMMUNITY): Payer: Self-pay

## 2023-02-05 ENCOUNTER — Other Ambulatory Visit: Payer: Self-pay

## 2023-02-05 ENCOUNTER — Inpatient Hospital Stay: Payer: BC Managed Care – PPO | Attending: Hematology

## 2023-02-05 ENCOUNTER — Other Ambulatory Visit: Payer: Self-pay | Admitting: Hematology

## 2023-02-05 ENCOUNTER — Other Ambulatory Visit (HOSPITAL_COMMUNITY): Payer: Self-pay

## 2023-02-05 ENCOUNTER — Inpatient Hospital Stay (HOSPITAL_BASED_OUTPATIENT_CLINIC_OR_DEPARTMENT_OTHER): Payer: BC Managed Care – PPO | Admitting: Hematology

## 2023-02-05 VITALS — BP 126/64 | HR 71 | Temp 98.3°F | Resp 20 | Ht 60.0 in | Wt 259.3 lb

## 2023-02-05 DIAGNOSIS — C3412 Malignant neoplasm of upper lobe, left bronchus or lung: Secondary | ICD-10-CM

## 2023-02-05 DIAGNOSIS — C7931 Secondary malignant neoplasm of brain: Secondary | ICD-10-CM

## 2023-02-05 DIAGNOSIS — Z79899 Other long term (current) drug therapy: Secondary | ICD-10-CM | POA: Diagnosis not present

## 2023-02-05 LAB — CMP (CANCER CENTER ONLY)
ALT: 12 U/L (ref 0–44)
AST: 16 U/L (ref 15–41)
Albumin: 4.1 g/dL (ref 3.5–5.0)
Alkaline Phosphatase: 75 U/L (ref 38–126)
Anion gap: 7 (ref 5–15)
BUN: 18 mg/dL (ref 6–20)
CO2: 24 mmol/L (ref 22–32)
Calcium: 9.2 mg/dL (ref 8.9–10.3)
Chloride: 104 mmol/L (ref 98–111)
Creatinine: 1.2 mg/dL — ABNORMAL HIGH (ref 0.44–1.00)
GFR, Estimated: 53 mL/min — ABNORMAL LOW (ref >60)
Glucose, Bld: 115 mg/dL — ABNORMAL HIGH (ref 70–99)
Potassium: 4.4 mmol/L (ref 3.5–5.1)
Sodium: 135 mmol/L (ref 135–145)
Total Bilirubin: 0.5 mg/dL (ref 0.3–1.2)
Total Protein: 6.9 g/dL (ref 6.5–8.1)

## 2023-02-05 LAB — CBC WITH DIFFERENTIAL (CANCER CENTER ONLY)
Abs Immature Granulocytes: 0 10*3/uL (ref 0.00–0.07)
Basophils Absolute: 0 10*3/uL (ref 0.0–0.1)
Basophils Relative: 0 %
Eosinophils Absolute: 0 10*3/uL (ref 0.0–0.5)
Eosinophils Relative: 0 %
HCT: 34.4 % — ABNORMAL LOW (ref 36.0–46.0)
Hemoglobin: 11.7 g/dL — ABNORMAL LOW (ref 12.0–15.0)
Immature Granulocytes: 0 %
Lymphocytes Relative: 25 %
Lymphs Abs: 0.9 10*3/uL (ref 0.7–4.0)
MCH: 28.7 pg (ref 26.0–34.0)
MCHC: 34 g/dL (ref 30.0–36.0)
MCV: 84.3 fL (ref 80.0–100.0)
Monocytes Absolute: 0.3 10*3/uL (ref 0.1–1.0)
Monocytes Relative: 9 %
Neutro Abs: 2.2 10*3/uL (ref 1.7–7.7)
Neutrophils Relative %: 66 %
Platelet Count: 160 10*3/uL (ref 150–400)
RBC: 4.08 MIL/uL (ref 3.87–5.11)
RDW: 12.8 % (ref 11.5–15.5)
WBC Count: 3.4 10*3/uL — ABNORMAL LOW (ref 4.0–10.5)
nRBC: 0 % (ref 0.0–0.2)

## 2023-02-05 MED ORDER — ZOLPIDEM TARTRATE 10 MG PO TABS: ORAL_TABLET | ORAL | 0 refills | Status: DC

## 2023-02-05 MED ORDER — OSIMERTINIB MESYLATE 80 MG PO TABS
80.0000 mg | ORAL_TABLET | Freq: Every day | ORAL | 3 refills | Status: DC
  Filled 2023-02-05: qty 30, 30d supply, fill #0
  Filled 2023-03-04: qty 30, 30d supply, fill #1
  Filled 2023-03-31: qty 30, 30d supply, fill #2
  Filled 2023-05-04: qty 30, 30d supply, fill #3

## 2023-02-05 NOTE — Assessment & Plan Note (Signed)
-  From her lung cancer, status post brain radiation twice  -f/u with rad/onc and Dr. Barbaraann Cao -next brain MRI scheduled for 03/18/23

## 2023-02-05 NOTE — Assessment & Plan Note (Signed)
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 She tolerates moderately well with mucositis and skin toxicities.   -she had thoracic radiation (The primary tumor and involved mediastinal adenopathy were treated to 66 Gy in 33 fractions of 2 Gy), completed on 11/04/2021 - her recent restaging CT scan from May 22, 2022 showed continued response in the primary site and thoracic adenopathy, or new lesions. -She is clinically doing well, lives independently, tolerating Tagrisso well, will continue. -Restaging brain MRI from December 21st 2023 showed a stable brain lesions. - restaging CT from 09/23/2022 showed area of chronic postradiation mass-like fibrosis in the posteromedial aspect of the left upper lung, without definitive findings of locally recurrent disease or definite metastatic disease in the chest, abdomen or pelvis

## 2023-02-05 NOTE — Progress Notes (Signed)
[facility]   [phone] [phone]   Clinic Follow up Note   Patient Care Team: Richmond Campbell., PA-C as PCP - General (Family Medicine) Malachy Mood, MD as Consulting Physician (Hematology and Oncology) Margaretmary Dys, MD as Consulting Physician (Radiation Oncology)  Date of Service:  02/05/2023  CHIEF COMPLAINT: f/u of metastatic lung cancer   CURRENT THERAPY:  Tagrisso, 80 mg daily, started 05/22/21    ASSESSMENT:  Abigail Golden is a 57 y.o. 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 She tolerates moderately well with mucositis and skin toxicities.   -she had thoracic radiation (The primary tumor and involved mediastinal adenopathy were treated to 66 Gy in 33 fractions of 2 Gy), completed on 11/04/2021 - her recent restaging CT scan from May 22, 2022 showed continued response in the primary site and thoracic adenopathy, or new lesions. -She is clinically doing well, lives independently, tolerating Tagrisso well, will continue. -Restaging brain MRI from December 21st 2023 showed a stable brain lesions. - restaging CT from 09/23/2022 showed area of chronic postradiation mass-like fibrosis in the posteromedial aspect of the left upper lung, without definitive findings of locally recurrent disease or definite metastatic disease in the chest, abdomen or pelvis  Malignant neoplasm metastatic to brain [facility]) -From her lung cancer, status post brain radiation twice  -f/u with rad/onc and Dr. Barbaraann Cao -next brain MRI scheduled for 03/18/23    PLAN: -lab reviewed -Continue Tagrisso  -I order CT CAP in October -I refill Ambien -lab and f/u in 2 months  SUMMARY OF ONCOLOGIC HISTORY: Oncology History Overview Note   Cancer Staging  Primary adenocarcinoma of upper lobe of left lung  (HCC) Staging form: Lung, AJCC 8th Edition - Clinical stage from 04/24/2021: Stage IVA (cT2, cN2, pM1b) - Signed by Malachy Mood, MD on 05/14/2021    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)  04/22/2021 Imaging   CT HEAD WO CONTRAST   IMPRESSION: Two separate areas of vasogenic edema within the left hemisphere, 1 within the inferior frontal region in the other at the left parietal vertex. Metastatic disease would be the most likely cause of this appearance. Other possibilities include venous infarctions and cerebritis. MRI with contrast recommended.    04/23/2021 Imaging   MR Brain W and Wo Contrast  IMPRESSION: 1. Metastatic disease to the brain. Two enhancing brain masses with moderate associated vasogenic edema: solid 16 mm left parietal lobe mass, and a larger 29 mm rim enhancing cystic or necrotic mass in the left inferior frontal gyrus. Two other small 2-3 mm metastases in the right inferior frontal gyrus, right posterior temporal lobe. And questionable two additional punctate metastases in both superior frontal gyri abutting the falx (series 18, image 46). 2. No significant intracranial mass effect. No other intracranial abnormality.   04/23/2021 Imaging   CT Chest W Contrast  IMPRESSION: 4.5 cm spiculated left upper lobe mass most compatible with primary lung cancer. This abuts the medial pleural surface. A few small adjacent pre-vascular mediastinal lymph nodes, none pathologically enlarged. Recommend cardiothoracic surgical and oncologic consultation.   No acute findings or evidence of metastatic disease in the abdomen or pelvis.   04/24/2021 Cancer Staging   Staging form: Lung, AJCC 8th Edition - Clinical stage from 04/24/2021: Stage IVA (cT2, cN2, pM1b) - Signed by Malachy Mood, MD  on 05/14/2021   04/24/2021 Initial Biopsy   FINAL MICROSCOPIC DIAGNOSIS:   A. LUNG, LUL, FINE NEEDLE ASPIRATION:  - Malignant cells present, consistent with adenocarcinoma   B. LUNG,  LUL, BRUSHING:  - Malignant cells consistent with adenocarcinoma, see comment    COMMENT:  B.  Immunohistochemical stains show that the tumor cells are positive for TTF-1 while they are negative for p63 and CK5/6, consistent with above interpretation. The number of tumor cells appears to be insufficient for molecular studies.   05/03/2021 Initial Diagnosis   Lung cancer (HCC)   05/08/2021 - 05/13/2021 Radiation Therapy   SRS treatment to the metastatic brain lesions under the care of Dr. Kathrynn Running    08/05/2021 Imaging   EXAM: CT CHEST, ABDOMEN, AND PELVIS WITH CONTRAST  IMPRESSION: 1. No substantial change in size of the left upper lobe pulmonary lesion. 2. Borderline to mildly enlarged adjacent prevascular/AP window lymph nodes have increased in size in the interval. This finding raises concern for disease progression. 3. Interval development of upper normal bilateral common iliac and left external iliac lymph nodes. Close attention on follow-up recommended. 4. No other evidence for metastatic disease in the abdomen or pelvis.   09/22/2022 Imaging    IMPRESSION: 1. Area of chronic postradiation mass-like fibrosis in the posteromedial aspect of the left upper lung, without definitive findings of locally recurrent disease or definite metastatic disease in the chest, abdomen or pelvis. 2. Mild atherosclerosis. 3. Additional incidental findings, as above.        INTERVAL HISTORY:  Abigail Golden is here for a follow up of metastatic lung cancer. She was last seen by me on 11/28/2022. She presents to the clinic alone. Pt state that she drove herself to the appointment today. She reports of still having some fatigue.Pt stated that she has been sleeping a lot. Pt report of missing two days of her Tagrisso.   All other systems were reviewed with the patient and are negative.  MEDICAL HISTORY:  Past Medical History:  Diagnosis Date   Allergy    Anxiety    Chronic bronchitis  (HCC)    Depression    Headache    Hypoglycemia    Lower back pain    since fall ~ 2012 (05/25/2015)   Migraine    05/25/2015 "probably once/month"   Migraine headache    Pain in left hip    since fall ~ 2012 (05/25/2015)   Pneumonia "several times"    SURGICAL HISTORY: Past Surgical History:  Procedure Laterality Date   ABDOMINAL HYSTERECTOMY  11/2002   "found benign tumors"   BRONCHIAL BIOPSY  04/24/2021   Procedure: BRONCHIAL BIOPSIES;  Surgeon: Josephine Igo, DO;  Location: [facility];  Service: Pulmonary;;   BRONCHIAL BRUSHINGS  04/24/2021   Procedure: BRONCHIAL BRUSHINGS;  Surgeon: Josephine Igo, DO;  Location: [facility];  Service: Pulmonary;;   BRONCHIAL NEEDLE ASPIRATION BIOPSY  04/24/2021   Procedure: BRONCHIAL NEEDLE ASPIRATION BIOPSIES;  Surgeon: Josephine Igo, DO;  Location: [facility];  Service: Pulmonary;;   CHEST TUBE INSERTION  04/24/2021   Procedure: CHEST TUBE INSERTION;  Surgeon: Josephine Igo, DO;  Location: [facility];  Service: Pulmonary;;   CHOLECYSTECTOMY N/A 05/25/2015   Procedure: LAPAROSCOPIC CHOLECYSTECTOMY;  Surgeon: Axel Filler, MD;  Location: [facility] OR;  Service: General;  Laterality: N/A;   LAPAROSCOPIC CHOLECYSTECTOMY  05/25/2015   MENISCUS REPAIR Right 03/2017   VIDEO BRONCHOSCOPY WITH ENDOBRONCHIAL NAVIGATION N/A 04/24/2021   Procedure: VIDEO BRONCHOSCOPY WITH ENDOBRONCHIAL NAVIGATION;  Surgeon: Josephine Igo, DO;  Location: [facility];  Service: Pulmonary;  Laterality: N/A;   VIDEO BRONCHOSCOPY WITH RADIAL ENDOBRONCHIAL ULTRASOUND  04/24/2021   Procedure: VIDEO BRONCHOSCOPY WITH RADIAL ENDOBRONCHIAL ULTRASOUND;  Surgeon: Josephine Igo, DO;  Location: [facility];  Service: Pulmonary;;    I have reviewed the social history and family history with the patient and they are unchanged from previous note.  ALLERGIES:  is allergic to celebrex [celecoxib] and minocycline.  MEDICATIONS:  Current Outpatient Medications  Medication  Sig Dispense Refill   acetaminophen (TYLENOL) 500 MG tablet Take 500 mg by mouth every 6 (six) hours as needed for moderate pain or headache.     albuterol (VENTOLIN HFA) 108 (90 Base) MCG/ACT inhaler Inhale 2 puffs into the lungs every 4 (four) hours as needed.     ALPRAZolam (XANAX) 0.25 MG tablet Take 0.25 mg by mouth every 4 (four) hours as needed for anxiety. for anxiety     aspirin EC 81 MG tablet Take 81 mg by mouth daily. Swallow whole.     citalopram (CELEXA) 10 MG tablet Take 10 mg by mouth at bedtime.     clindamycin (CLEOCIN T) 1 % external solution Apply topically 2 (two) times daily. 30 mL 0   Diclofenac Potassium,Migraine, 50 MG PACK Take 1 each by mouth as needed. As needed for migraines     diclofenac sodium (VOLTAREN) 1 % GEL Apply topically as needed. To affected area     diphenhydrAMINE (BENADRYL) 25 MG tablet Take 25 mg by mouth See admin instructions. 25 mg at bedtime 25 mg as needed for itching     Docusate Sodium (STOOL SOFTENER LAXATIVE PO) Take 1 tablet by mouth as needed.     eletriptan (RELPAX) 20 MG tablet Take 20 mg by mouth as needed for headache (at onset of migraine.). May repeat in 2 hours if needed.     fluconazole (DIFLUCAN) 200 MG tablet Take 1 tablet (200 mg total) by mouth daily. (Patient taking differently: Take 200 mg by mouth daily as needed.) 14 tablet 0   fluticasone (FLONASE) 50 MCG/ACT nasal spray Place 2 sprays into both nostrils daily as needed for allergies.     hydrOXYzine (ATARAX/VISTARIL) 25 MG tablet Take 25 mg by mouth See admin instructions. 25 mg at bedtime 25 mg during the day as needed for itching/anxiety     loratadine (CLARITIN) 10 MG tablet Take 10 mg by mouth daily.     magic mouthwash (nystatin, lidocaine, diphenhydrAMINE, alum & mag hydroxide) suspension Swish and swallow 5 mls by mouth every 6 hours as needed 250 mL 1   magic mouthwash w/lidocaine SOLN Take 5 mLs by mouth every 6 hours as needed for mouth pain. Swish and Swallow 250  mL 1   montelukast (SINGULAIR) 10 MG tablet Take 10 mg by mouth at bedtime.     nystatin powder APPLY  POWDER TOPICALLY TO AFFECTED AREA THREE TIMES DAILY 60 g 1   ondansetron (ZOFRAN) 4 MG tablet Take 1 tablet (4 mg total) by mouth every 4 (four) hours as needed for nausea or vomiting. 12 tablet 0   ondansetron (ZOFRAN-ODT) 4 MG disintegrating tablet DISSOLVE 1 TABLET IN MOUTH EVERY 8 HOURS AS NEEDED FOR NAUSEA AND VOMITING 18 tablet 1   osimertinib mesylate (TAGRISSO) 80 MG tablet Take 1 tablet (80 mg total) by mouth daily. 30 tablet 3   prochlorperazine (COMPAZINE) 10 MG tablet TAKE 1 TABLET BY MOUTH EVERY 6 HOURS AS NEEDED FOR NAUSEA FOR  VOMITING 30 tablet 1   solifenacin (VESICARE) 10 MG tablet Take 10 mg by mouth every evening.     zolpidem (AMBIEN) 10 MG tablet TAKE 1/2 TO 1 (ONE-HALF TO ONE) TABLET BY MOUTH AT BEDTIME AS NEEDED FOR SLEEP 30 tablet 0   No current facility-administered medications for this visit.    PHYSICAL EXAMINATION: ECOG PERFORMANCE STATUS: 2 - Symptomatic, <50% confined to bed  Vitals:   02/05/23 1323  BP: 126/64  Pulse: 71  Resp: 20  Temp: 98.3 F (36.8 C)  SpO2: 98%   Wt Readings from Last 3 Encounters:  02/05/23 259 lb 4.8 oz (117.6 kg)  11/28/22 262 lb 8 oz (119.1 kg)  11/18/22 260 lb (117.9 kg)     GENERAL:alert, no distress and comfortable SKIN: skin color normal, no rashes or significant lesions EYES: normal, Conjunctiva are pink and non-injected, sclera clear  NEURO: alert & oriented x 3 with fluent speech LUNGS: (-)clear to auscultation and percussion with normal breathing effort HEART: (-)regular rate & rhythm and no murmurs and(-) no lower extremity edema  LABORATORY DATA:  I have reviewed the data as listed    Latest Ref Rng & Units 02/05/2023   12:58 PM 11/28/2022    1:50 PM 11/18/2022    3:11 PM  CBC  WBC 4.0 - 10.5 K/uL 3.4  3.2  2.7   Hemoglobin 12.0 - 15.0 g/dL 40.9  81.1  91.4   Hematocrit 36.0 - 46.0 % 34.4  33.4  44.9    Platelets 150 - 400 K/uL 160  158  126         Latest Ref Rng & Units 02/05/2023   12:58 PM 11/28/2022    1:50 PM 11/18/2022    3:11 PM  CMP  Glucose 70 - 99 mg/dL 782  956  213   BUN 6 - 20 mg/dL 18  19  16    Creatinine 0.44 - 1.00 mg/dL 0.86  5.78  4.69   Sodium 135 - 145 mmol/L 135  138  132   Potassium 3.5 - 5.1 mmol/L 4.4  4.5  4.2   Chloride 98 - 111 mmol/L 104  105  102   CO2 22 - 32 mmol/L 24  27  22    Calcium 8.9 - 10.3 mg/dL 9.2  9.4  8.9   Total Protein 6.5 - 8.1 g/dL 6.9  7.2    Total Bilirubin 0.3 - 1.2 mg/dL 0.5  0.5    Alkaline Phos 38 - 126 U/L 75  76    AST 15 - 41 U/L 16  16    ALT 0 - 44 U/L 12  12        RADIOGRAPHIC STUDIES: I have personally reviewed the radiological images as listed and agreed with the findings in the report. No results found.    Orders Placed This Encounter  Procedures   CT CHEST ABDOMEN PELVIS W CONTRAST    Standing Status:   Future    Standing Expiration Date:   02/05/2024    Order Specific Question:   If indicated for the ordered procedure, I authorize the administration of contrast media per Radiology protocol    Answer:   Yes    Order Specific Question:   Does the patient have a contrast media/X-ray dye allergy?    Answer:   No    Order Specific Question:   Is patient pregnant?    Answer:   No    Order Specific Question:   Preferred imaging  location?    Answer:   [facility]    Order Specific Question:   If indicated for the ordered procedure, I authorize the administration of oral contrast media per Radiology protocol    Answer:   Yes   All questions were answered. The patient knows to call the clinic with any problems, questions or concerns. No barriers to learning was detected. The total time spent in the appointment was 25 minutes.     Malachy Mood, MD 02/05/2023   Carolin Coy, CMA, am acting as scribe for Malachy Mood, MD.   I have reviewed the above documentation for accuracy and completeness, and I  agree with the above.

## 2023-02-10 ENCOUNTER — Other Ambulatory Visit (HOSPITAL_COMMUNITY): Payer: Self-pay

## 2023-03-04 ENCOUNTER — Other Ambulatory Visit: Payer: Self-pay

## 2023-03-18 ENCOUNTER — Ambulatory Visit (HOSPITAL_COMMUNITY)
Admission: RE | Admit: 2023-03-18 | Discharge: 2023-03-18 | Disposition: A | Discharge status: Home / Self Care | Payer: BC Managed Care – PPO | Source: Ambulatory Visit | Attending: Radiation Oncology | Admitting: Radiation Oncology

## 2023-03-18 ENCOUNTER — Other Ambulatory Visit: Payer: Self-pay

## 2023-03-18 ENCOUNTER — Encounter: Payer: Self-pay | Admitting: Hematology

## 2023-03-18 DIAGNOSIS — C7931 Secondary malignant neoplasm of brain: Secondary | ICD-10-CM | POA: Diagnosis present

## 2023-03-18 MED ORDER — GADOBUTROL 1 MMOL/ML IV SOLN
10.0000 mL | Freq: Once | INTRAVENOUS | Status: AC | PRN
  Administered 2023-03-18: 10 mL via INTRAVENOUS

## 2023-03-19 ENCOUNTER — Other Ambulatory Visit: Payer: Self-pay

## 2023-03-19 ENCOUNTER — Ambulatory Visit (HOSPITAL_COMMUNITY)
Admission: RE | Admit: 2023-03-19 | Discharge: 2023-03-19 | Disposition: A | Discharge status: Home / Self Care | Payer: BC Managed Care – PPO | Source: Ambulatory Visit | Attending: Hematology | Admitting: Hematology

## 2023-03-19 ENCOUNTER — Inpatient Hospital Stay: Payer: BC Managed Care – PPO | Attending: Hematology

## 2023-03-19 DIAGNOSIS — C3412 Malignant neoplasm of upper lobe, left bronchus or lung: Secondary | ICD-10-CM | POA: Insufficient documentation

## 2023-03-19 DIAGNOSIS — C7931 Secondary malignant neoplasm of brain: Secondary | ICD-10-CM

## 2023-03-19 LAB — CMP (CANCER CENTER ONLY)
ALT: 11 U/L (ref 0–44)
AST: 16 U/L (ref 15–41)
Albumin: 4.1 g/dL (ref 3.5–5.0)
Alkaline Phosphatase: 76 U/L (ref 38–126)
Anion gap: 7 (ref 5–15)
BUN: 21 mg/dL — ABNORMAL HIGH (ref 6–20)
CO2: 25 mmol/L (ref 22–32)
Calcium: 9.4 mg/dL (ref 8.9–10.3)
Chloride: 106 mmol/L (ref 98–111)
Creatinine: 1.34 mg/dL — ABNORMAL HIGH (ref 0.44–1.00)
GFR, Estimated: 47 mL/min — ABNORMAL LOW (ref >60)
Glucose, Bld: 88 mg/dL (ref 70–99)
Potassium: 4.5 mmol/L (ref 3.5–5.1)
Sodium: 138 mmol/L (ref 135–145)
Total Bilirubin: 0.4 mg/dL (ref 0.3–1.2)
Total Protein: 7.2 g/dL (ref 6.5–8.1)

## 2023-03-19 LAB — CBC WITH DIFFERENTIAL (CANCER CENTER ONLY)
Abs Immature Granulocytes: 0 10*3/uL (ref 0.00–0.07)
Basophils Absolute: 0 10*3/uL (ref 0.0–0.1)
Basophils Relative: 0 %
Eosinophils Absolute: 0 10*3/uL (ref 0.0–0.5)
Eosinophils Relative: 0 %
HCT: 36.8 % (ref 36.0–46.0)
Hemoglobin: 12.3 g/dL (ref 12.0–15.0)
Immature Granulocytes: 0 %
Lymphocytes Relative: 33 %
Lymphs Abs: 1 10*3/uL (ref 0.7–4.0)
MCH: 28.4 pg (ref 26.0–34.0)
MCHC: 33.4 g/dL (ref 30.0–36.0)
MCV: 85 fL (ref 80.0–100.0)
Monocytes Absolute: 0.3 10*3/uL (ref 0.1–1.0)
Monocytes Relative: 11 %
Neutro Abs: 1.7 10*3/uL (ref 1.7–7.7)
Neutrophils Relative %: 56 %
Platelet Count: 176 10*3/uL (ref 150–400)
RBC: 4.33 MIL/uL (ref 3.87–5.11)
RDW: 12.8 % (ref 11.5–15.5)
WBC Count: 3 10*3/uL — ABNORMAL LOW (ref 4.0–10.5)
nRBC: 0 % (ref 0.0–0.2)

## 2023-03-19 MED ORDER — IOHEXOL 300 MG/ML  SOLN
100.0000 mL | Freq: Once | INTRAMUSCULAR | Status: AC | PRN
  Administered 2023-03-19: 100 mL via INTRAVENOUS

## 2023-03-23 ENCOUNTER — Inpatient Hospital Stay: Payer: BC Managed Care – PPO

## 2023-03-24 ENCOUNTER — Ambulatory Visit
Admission: RE | Admit: 2023-03-24 | Discharge: 2023-03-24 | Disposition: A | Discharge status: Home / Self Care | Payer: BC Managed Care – PPO | Source: Ambulatory Visit | Attending: Urology | Admitting: Urology

## 2023-03-24 ENCOUNTER — Telehealth: Payer: Self-pay

## 2023-03-24 DIAGNOSIS — C7931 Secondary malignant neoplasm of brain: Secondary | ICD-10-CM

## 2023-03-24 NOTE — Progress Notes (Signed)
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  Interval Since Last Radiation:  16 months s/p SRS brain; 17 months s/p IMRT lung  12/10/21: Single fraction SRS brain PTV9   09/19/21 - 11/04/21: The primary tumor in the LUL lung and involved mediastinal adenopathy were treated to 66 Gy in 33 fractions of 2 Gy.  08/27/21: Single fraction SRS Brain PTV7-8   05/08/21 - 05/13/21: Fractionated SRS Brain PTV1 - PTV6     Narrative:  I spoke with the patient to conduct her routine scheduled 3 month follow up visit to review results of her recent MRI brain scan via telephone to spare the patient unnecessary potential exposure in the healthcare setting during the current COVID-19 pandemic.  The patient was notified in advance and gave permission to proceed with this visit format.  She has recovered well from the effects of her SRS treatments. Her most recent post-treatment MRI brain scan from 03/19/23 shows a slight increase in size of the previously treated superior left frontal lesion as compared to 12/16/22, a slightly fuller appearance of the lesion in the inferior left frontal lobe, favored treatment effect and stable appearance of tiny right frontal and left parietal cortically based lesions. However, there is a punctate lesion in the superior right frontal lobe that is stable as compared to 12/16/22 study but new since 09/11/22 and confirmed not previously treated on review of fusion with prior treatment plans. Fortunately, there are no new lesions present to suggest progressive disease.  We reviewed these results today.  On review of systems, the patient states that she is doing well  in general.  She reports that she has been having occasional headaches, typically behind her left ear and into the left posterior neck. She has had some associated nausea, light sensitivity, dizziness and "grainy vision" that typically respond to Tylenol but occasionally requires taking her migraine medication.  She specifically denies any changes in visual or auditory acuity, difficulty with speech, word finding, tremors or seizure activity. She has a history of intermittent migraine headaches, with associated nausea, dizziness/imbalance, and sensitivity to light that have responded quickly to her migraine medication. She has been having some joint pains in her knees and ankles, worse after she has been on her feet all day and recently she has had some tingling sensation in the left posterior head and neck as well as her usual mild fatigue in the afternoons. She continues to tolerate the Tagrisso fairly well and has not shown any evidence of disease progression on restaging CT C/A/P scans with her most recent scans completed 03/18/23.     ALLERGIES:  is allergic to celebrex [celecoxib] and minocycline.  Meds: Current Outpatient Medications  Medication Sig Dispense Refill   acetaminophen (TYLENOL) 500 MG tablet Take 500 mg by mouth every 6 (six) hours as needed for moderate pain or headache.     albuterol (VENTOLIN HFA) 108 (90 Base) MCG/ACT inhaler Inhale 2 puffs into the lungs every 4 (four) hours as needed.     ALPRAZolam (XANAX) 0.25 MG tablet Take 0.25 mg by mouth every 4 (four) hours as needed for anxiety. for anxiety     aspirin  EC 81 MG tablet Take 81 mg by mouth daily. Swallow whole.     citalopram (CELEXA) 10 MG tablet Take 10 mg by mouth at bedtime.     clindamycin (CLEOCIN T) 1 % external solution Apply topically 2 (two) times daily. 30 mL 0   Diclofenac Potassium,Migraine, 50 MG PACK Take 1 each by mouth as needed. As needed for migraines     diclofenac sodium (VOLTAREN) 1 % GEL Apply  topically as needed. To affected area     diphenhydrAMINE (BENADRYL) 25 MG tablet Take 25 mg by mouth See admin instructions. 25 mg at bedtime 25 mg as needed for itching     Docusate Sodium (STOOL SOFTENER LAXATIVE PO) Take 1 tablet by mouth as needed.     eletriptan (RELPAX) 20 MG tablet Take 20 mg by mouth as needed for headache (at onset of migraine.). May repeat in 2 hours if needed.     fluconazole (DIFLUCAN) 200 MG tablet Take 1 tablet (200 mg total) by mouth daily. (Patient taking differently: Take 200 mg by mouth daily as needed.) 14 tablet 0   fluticasone (FLONASE) 50 MCG/ACT nasal spray Place 2 sprays into both nostrils daily as needed for allergies.     hydrOXYzine (ATARAX/VISTARIL) 25 MG tablet Take 25 mg by mouth See admin instructions. 25 mg at bedtime 25 mg during the day as needed for itching/anxiety     loratadine (CLARITIN) 10 MG tablet Take 10 mg by mouth daily.     magic mouthwash (nystatin, lidocaine, diphenhydrAMINE, alum & mag hydroxide) suspension Swish and swallow 5 mls by mouth every 6 hours as needed 250 mL 1   magic mouthwash w/lidocaine SOLN Take 5 mLs by mouth every 6 hours as needed for mouth pain. Swish and Swallow 250 mL 1   montelukast (SINGULAIR) 10 MG tablet Take 10 mg by mouth at bedtime.     nystatin powder APPLY  POWDER TOPICALLY TO AFFECTED AREA THREE TIMES DAILY 60 g 1   ondansetron (ZOFRAN) 4 MG tablet Take 1 tablet (4 mg total) by mouth every 4 (four) hours as needed for nausea or vomiting. 12 tablet 0   ondansetron (ZOFRAN-ODT) 4 MG disintegrating tablet DISSOLVE 1 TABLET IN MOUTH EVERY 8 HOURS AS NEEDED FOR NAUSEA AND VOMITING 18 tablet 1   osimertinib mesylate (TAGRISSO) 80 MG tablet Take 1 tablet (80 mg total) by mouth daily. 30 tablet 3   prochlorperazine (COMPAZINE) 10 MG tablet TAKE 1 TABLET BY MOUTH EVERY 6 HOURS AS NEEDED FOR NAUSEA FOR VOMITING 30 tablet 1   solifenacin (VESICARE) 10 MG tablet Take 10 mg by mouth every evening.     zolpidem  (AMBIEN) 10 MG tablet TAKE 1/2 TO 1 (ONE-HALF TO ONE) TABLET BY MOUTH AT BEDTIME AS NEEDED FOR SLEEP 30 tablet 0   No current facility-administered medications for this encounter.    Physical Findings:  vitals were not taken for this visit.   /10 Unable to assess due to telephone follow-up visit format.  Lab Findings: Lab Results  Component Value Date   WBC 3.0 (L) 03/19/2023   HGB 12.3 03/19/2023   HCT 36.8 03/19/2023   MCV 85.0 03/19/2023   PLT 176 03/19/2023     Radiographic Findings: CT CHEST ABDOMEN PELVIS W CONTRAST  Result Date: 03/20/2023 CLINICAL DATA:  Metastatic non-small cell lung cancer restaging * Tracking Code: BO * EXAM: CT CHEST, ABDOMEN, AND PELVIS WITH CONTRAST TECHNIQUE: Multidetector CT imaging of the chest, abdomen and pelvis was performed following  the standard protocol during bolus administration of intravenous contrast. RADIATION DOSE REDUCTION: This exam was performed according to the departmental dose-optimization program which includes automated exposure control, adjustment of the mA and/or kV according to patient size and/or use of iterative reconstruction technique. CONTRAST:  OMNIPAQUE IOHEXOL 300 MG/ML  SOLN COMPARISON:  CT chest angiogram, CT abdomen pelvis, 11/18/2022 FINDINGS: CT CHEST FINDINGS Cardiovascular: No significant vascular findings. Normal heart size. No pericardial effusion. Mediastinum/Nodes: No enlarged mediastinal, hilar, or axillary lymph nodes. Thyroid gland, trachea, and esophagus demonstrate no significant findings. Lungs/Pleura: Unchanged post treatment appearance of posterior paramedian left upper lobe with dense post treatment fibrosis and volume loss (series 4, image 67). No pleural effusion or pneumothorax. Musculoskeletal: No chest wall abnormality. No acute osseous findings. CT ABDOMEN PELVIS FINDINGS Hepatobiliary: No focal liver abnormality is seen. Mildly coarse contour of the liver. Status post cholecystectomy. No biliary  dilatation. Pancreas: Unremarkable. No pancreatic ductal dilatation or surrounding inflammatory changes. Spleen: Normal in size without significant abnormality. Adrenals/Urinary Tract: Adrenal glands are unremarkable. Kidneys are normal, without renal calculi, solid lesion, or hydronephrosis. Bladder is unremarkable. Stomach/Bowel: Stomach is within normal limits. Appendix appears normal. No evidence of bowel wall thickening, distention, or inflammatory changes. Vascular/Lymphatic: Scattered aortic atherosclerosis. No enlarged abdominal or pelvic lymph nodes. Reproductive: Status post hysterectomy. Other: Small fat containing umbilical hernia.  No ascites. Musculoskeletal: No acute osseous findings. IMPRESSION: 1. Unchanged appearance of posterior paramedian left upper lobe with dense post treatment fibrosis and volume loss. 2. No evidence of recurrent or metastatic disease in the chest, abdomen, or pelvis. 3. Mildly coarse contour of the liver, suggestive of cirrhosis. Correlate with biochemical findings. 4. Status post cholecystectomy and hysterectomy. Aortic Atherosclerosis (ICD10-I70.0). Electronically Signed   By: Jearld Lesch M.D.   On: 03/20/2023 13:34   MR Brain W Wo Contrast  Result Date: 03/20/2023 CLINICAL DATA:  Brain metastases, assess treatment response. EXAM: MRI HEAD WITHOUT AND WITH CONTRAST TECHNIQUE: Multiplanar, multiecho pulse sequences of the brain and surrounding structures were obtained without and with intravenous contrast. CONTRAST:  10mL GADAVIST GADOBUTROL 1 MMOL/ML IV SOLN COMPARISON:  12/16/2022 FINDINGS: Brain: Increased size of a somewhat linear focus of enhancement in the high left frontal lobe measuring 4 mm on 1100:220, previously punctate. Node on the same image there is a more lateral dural based nodule suggesting meningioma, and stable over multiple priors at 3 mm. Punctate focus of superior right frontal lobe enhancement on 1100:245, in retrospect seen on prior and likely  stable, although not seen on the scan previous. Small foci of cortical enhancement in the left parietal lobe on 1100:232 and in the parasagittal right frontal lobe on 1100:222 are stable from priors. Irregularly-shaped nodular enhancement on both sides of the lower left sylvian fissure appears slightly fuller, especially the upper extent with mild increase in adjacent T2 hyperintensity. The dominant nodule in the frontal lobe measures 17 x 6 mm on 1100:156. Vascular: Major flow voids and vascular enhancements are preserved Skull and upper cervical spine: No focal marrow lesion Sinuses/Orbits: Negative IMPRESSION: 1. Slight increase in superior left frontal lesion highlighted on the prior, 12/16/2022 scan. This is in close proximity to a lesion seen 08/16/2021. 2. Punctate lesion in the superior right frontal lobe, better seen than on prior and new from 09/11/2022. Consider treatment scan overlay to ensure this is not also a remotely treated lesion. 3. Slightly fuller lesion in the inferior left frontal lobe with slight increase in T2 hyperintensity. This lesion spans the lower  sylvian fissure and has wispy appearance, favor treatment effect. 4. Right frontal and left parietal cortically based lesions are tiny and stable. Electronically Signed   By: Tiburcio Pea M.D.   On: 03/20/2023 13:33    Impression/Plan: 1. 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 She appears to have recovered well from the effects of her prior SRS brain treatments and is currently without complaints.  Her recent posttreatment MRI brain scan from 03/19/23 shows a slight increase in size of the previously treated superior left frontal lesion as compared to 12/16/22 favored treatment effect, a slightly fuller appearance of the lesion in the inferior left frontal lobe, favored treatment effect and stable appearance of tiny right frontal and left parietal  cortically based lesions. However, there is a punctate lesion in the superior right frontal lobe that is stable as compared to 12/16/22 study but new since 09/11/22 and confirmed not previously treated on review of fusion with prior treatment plans. Fortunately, there are no new lesions present to suggest progressive disease.  Her scans were reviewed in the recent multidisciplinary brain tumor conference on 03/23/23 and consensus recommendation is to proceed with a single fraction SRS treatment to the untreated lesion in the right frontal lobe. We discussed this recommendation today and she is familiar with the risks benefits and associated side effects of SRS and is in agreement to proceed. All questions and concerns were answered and she is in agreement with the stated plan so we will share our discussion with Dr. Mosetta Putt and coordinate the [facility] treatment planning and treatment with her neurosurgeon, Dr. Jake Samples, in anticipation of proceeding with treatment in the near future.   She will also continue in routine follow up with Dr. Mosetta Putt for continued management of her systemic disease. She continues to tolerate the Tagrisso fairly well and her most recent disease restaging imaging from 03/18/23 continues to show excellent response to treatment with decreased size of left supraclavicular and mediastinal lymph nodes and evolution of radiation changes in the LUL lung. She is scheduled for a follow up visit with Dr. Mosetta Putt on 03/26/23 and she knows that she is welcome to call with any questions or concerns at any time in the interim.  I personally spent 45 minutes in this encounter including chart review, reviewing radiological studies, telephone conversation with the patient, entering orders, coordinating her care and completing documentation.     Marguarite Arbour, PA-C

## 2023-03-24 NOTE — Telephone Encounter (Signed)
Scheduled patient for CT Simulation for [facility]. Spoke to patient about schedule and patient agreed.

## 2023-03-25 ENCOUNTER — Ambulatory Visit: Payer: BC Managed Care – PPO | Admitting: Urology

## 2023-03-25 ENCOUNTER — Ambulatory Visit: Payer: BC Managed Care – PPO | Admitting: Radiation Oncology

## 2023-03-26 ENCOUNTER — Ambulatory Visit
Admission: RE | Admit: 2023-03-26 | Payer: BC Managed Care – PPO | Source: Ambulatory Visit | Admitting: Radiation Oncology

## 2023-03-26 ENCOUNTER — Inpatient Hospital Stay: Payer: BC Managed Care – PPO | Attending: Hematology | Admitting: Hematology

## 2023-03-26 ENCOUNTER — Ambulatory Visit
Admission: RE | Admit: 2023-03-26 | Discharge: 2023-03-26 | Disposition: A | Discharge status: Home / Self Care | Payer: BC Managed Care – PPO | Source: Ambulatory Visit | Attending: Radiation Oncology | Admitting: Radiation Oncology

## 2023-03-26 ENCOUNTER — Encounter: Payer: Self-pay | Admitting: Hematology

## 2023-03-26 ENCOUNTER — Other Ambulatory Visit: Payer: Self-pay

## 2023-03-26 VITALS — BP 119/76 | HR 67 | Temp 97.7°F | Resp 18 | Ht 60.0 in | Wt 259.3 lb

## 2023-03-26 DIAGNOSIS — Z79899 Other long term (current) drug therapy: Secondary | ICD-10-CM | POA: Diagnosis not present

## 2023-03-26 DIAGNOSIS — Z452 Encounter for adjustment and management of vascular access device: Secondary | ICD-10-CM | POA: Insufficient documentation

## 2023-03-26 DIAGNOSIS — G4700 Insomnia, unspecified: Secondary | ICD-10-CM | POA: Insufficient documentation

## 2023-03-26 DIAGNOSIS — R110 Nausea: Secondary | ICD-10-CM | POA: Diagnosis not present

## 2023-03-26 DIAGNOSIS — C7931 Secondary malignant neoplasm of brain: Secondary | ICD-10-CM

## 2023-03-26 DIAGNOSIS — Z7982 Long term (current) use of aspirin: Secondary | ICD-10-CM | POA: Diagnosis not present

## 2023-03-26 DIAGNOSIS — C3412 Malignant neoplasm of upper lobe, left bronchus or lung: Secondary | ICD-10-CM | POA: Diagnosis not present

## 2023-03-26 MED ORDER — ONDANSETRON 4 MG PO TBDP: ORAL_TABLET | ORAL | 1 refills | Status: DC

## 2023-03-26 MED ORDER — ZOLPIDEM TARTRATE 10 MG PO TABS: ORAL_TABLET | ORAL | 0 refills | Status: DC

## 2023-03-26 NOTE — Assessment & Plan Note (Addendum)
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 She tolerates moderately well with mucositis and skin toxicities.   -she had thoracic radiation (The primary tumor and involved mediastinal adenopathy were treated to 66 Gy in 33 fractions of 2 Gy), completed on 11/04/2021 - her recent restaging CT scan from May 22, 2022 showed continued response in the primary site and thoracic adenopathy, or new lesions. -She is clinically doing well, lives independently, tolerating Tagrisso well, will continue. - restaging CT from 03/19/2023 showed post radiation change in lung and no evidence of recurrence. Will continue Tagrisso  -she we will continue follow-up with his radiation oncologist Dr. Kathrynn Running for her brain metastasis, as single fraction SRS was recommended based on her recent brain MRI from March 18, 2023

## 2023-03-26 NOTE — Progress Notes (Signed)
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 She tolerates moderately well with mucositis and skin toxicities.   -she had thoracic radiation (The primary tumor and involved mediastinal adenopathy were treated to 66 Gy in 33 fractions of 2 Gy), completed on 11/04/2021 - her recent restaging CT scan from May 22, 2022 showed continued response in the primary site and thoracic adenopathy, or new lesions. -She is clinically doing well, lives independently, tolerating Tagrisso well, will continue. - restaging CT from 03/19/2023 showed post radiation change in lung and no evidence of recurrence. Will continue Tagrisso  -she we will continue follow-up with his radiation oncologist Dr. Kathrynn Running for her brain metastasis, as single fraction SRS was recommended based on her recent brain MRI from March 18, 2023     Assessment and Plan    Metastatic lung adenocarcinoma I reviewed her recent MRI of brain scan and restaging CT scan results, new brain lesion identified with associated headaches and blurry vision. Stable disease in the chest, abdomen, and pelvis on Tagrisso. -Continue Tagrisso.  She is overall tolerating well.  Lab reviewed, stable -Undergo brain radiation for new lesion.  Skin Irritation Complaints of drainage and itchiness in skin folds for a couple of months. -Use Neosporin more  frequently. -Dry skin thoroughly after showering. -Use Nystatin powder.  Insomnia Current prescription of Ambien running low. -Refill Ambien prescription at [facility] [address]  Nausea Patient has Zofran on hand for nausea. -Refill Zofran as needed.  Follow-up in 6 weeks. Schedule next CT scan around Christmas time        SUMMARY OF ONCOLOGIC HISTORY: Oncology History Overview Note   Cancer Staging  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) Staging form: Lung, AJCC 8th Edition - Clinical stage from 04/24/2021: Stage IVA (cT2, cN2, pM1b) - Signed by Malachy Mood, MD on 05/14/2021    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)  04/22/2021 Imaging   CT HEAD WO CONTRAST   IMPRESSION: Two separate areas of vasogenic edema within the left hemisphere, 1 within the inferior frontal region in the other at the left parietal vertex. Metastatic disease would be the most likely cause of this appearance. Other possibilities include venous infarctions and cerebritis. MRI with contrast recommended.    04/23/2021 Imaging   MR Brain W and Wo Contrast  IMPRESSION: 1. Metastatic disease to the brain. Two enhancing brain masses with moderate associated vasogenic edema: solid 16 mm left parietal lobe mass, and a larger 29 mm rim enhancing cystic or necrotic mass in the left inferior frontal gyrus. Two other small 2-3 mm metastases in the right inferior frontal gyrus, right posterior temporal lobe. And questionable two additional punctate metastases in both superior frontal gyri abutting the falx (series 18, image 46). 2. No significant intracranial mass effect. No other intracranial abnormality.   04/23/2021 Imaging   CT Chest W Contrast  IMPRESSION: 4.5 cm spiculated left upper lobe mass most compatible with  primary lung cancer. This abuts the medial pleural surface. A few small adjacent pre-vascular mediastinal lymph nodes, none pathologically enlarged.  Recommend cardiothoracic surgical and oncologic consultation.   No acute findings or evidence of metastatic disease in the abdomen or pelvis.   04/24/2021 Cancer Staging   Staging form: Lung, AJCC 8th Edition - Clinical stage from 04/24/2021: Stage IVA (cT2, cN2, pM1b) - Signed by Malachy Mood, MD on 05/14/2021   04/24/2021 Initial Biopsy   FINAL MICROSCOPIC DIAGNOSIS:   A. LUNG, LUL, FINE NEEDLE ASPIRATION:  - Malignant cells present, consistent with adenocarcinoma   B. LUNG, LUL, BRUSHING:  - Malignant cells consistent with adenocarcinoma, see comment    COMMENT:  B.  Immunohistochemical stains show that the tumor cells are positive for TTF-1 while they are negative for p63 and CK5/6, consistent with above interpretation. The number of tumor cells appears to be insufficient for molecular studies.   05/03/2021 Initial Diagnosis   Lung cancer (HCC)   05/08/2021 - 05/13/2021 Radiation Therapy   SRS treatment to the metastatic brain lesions under the care of Dr. Kathrynn Running    08/05/2021 Imaging   EXAM: CT CHEST, ABDOMEN, AND PELVIS WITH CONTRAST  IMPRESSION: 1. No substantial change in size of the left upper lobe pulmonary lesion. 2. Borderline to mildly enlarged adjacent prevascular/AP window lymph nodes have increased in size in the interval. This finding raises concern for disease progression. 3. Interval development of upper normal bilateral common iliac and left external iliac lymph nodes. Close attention on follow-up recommended. 4. No other evidence for metastatic disease in the abdomen or pelvis.   09/22/2022 Imaging    IMPRESSION: 1. Area of chronic postradiation mass-like fibrosis in the posteromedial aspect of the left upper lung, without definitive findings of locally recurrent disease or definite metastatic disease in the chest, abdomen or pelvis. 2. Mild atherosclerosis. 3. Additional incidental findings, as above.     03/19/2023 Imaging   CT chest,abdomen, and  pelvis with contrast IMPRESSION: 1. Unchanged appearance of posterior paramedian left upper lobe with dense post treatment fibrosis and volume loss. 2. No evidence of recurrent or metastatic disease in the chest, abdomen, or pelvis. 3. Mildly coarse contour of the liver, suggestive of cirrhosis. Correlate with biochemical findings. 4. Status post cholecystectomy and hysterectomy.  Aortic Atherosclerosis (ICD10-       Discussed the use of AI scribe software for clinical note transcription with the patient, who gave verbal consent to proceed.  History of Present Illness   The patient, a 57 year old with metastatic nonmuscular lung cancer, presents for a follow-up visit. She reports a recent discovery of a new spot in the brain, which has been causing headaches and blurry vision. The patient also reports shortness of breath, but no chest problems. Her condition has been managed with Tagrisso, which has been effective for the chest, abdomen, and pelvis, but not as effective for the brain. Her skin condition has improved, with less itching reported. She has been experiencing issues with drainage for a couple of months, which has not been causing pain. She has been managing this issue with creams and lotions. She is also on Zofran and Ambien for other symptoms.         All other systems were reviewed with the patient and are negative.  MEDICAL HISTORY:  Past Medical History:  Diagnosis Date   Allergy    Anxiety    Chronic bronchitis (HCC)    Depression    Headache    Hypoglycemia  Lower back pain    since fall ~ 2012 (05/25/2015)   Migraine    05/25/2015 "probably once/month"   Migraine headache    Pain in left hip    since fall ~ 2012 (05/25/2015)   Pneumonia "several times"    SURGICAL HISTORY: Past Surgical History:  Procedure Laterality Date   ABDOMINAL HYSTERECTOMY  11/2002   "found benign tumors"   BRONCHIAL BIOPSY  04/24/2021   Procedure: BRONCHIAL BIOPSIES;  Surgeon:  Josephine Igo, DO;  Location: [facility];  Service: Pulmonary;;   BRONCHIAL BRUSHINGS  04/24/2021   Procedure: BRONCHIAL BRUSHINGS;  Surgeon: Josephine Igo, DO;  Location: [facility];  Service: Pulmonary;;   BRONCHIAL NEEDLE ASPIRATION BIOPSY  04/24/2021   Procedure: BRONCHIAL NEEDLE ASPIRATION BIOPSIES;  Surgeon: Josephine Igo, DO;  Location: [facility];  Service: Pulmonary;;   CHEST TUBE INSERTION  04/24/2021   Procedure: CHEST TUBE INSERTION;  Surgeon: Josephine Igo, DO;  Location: [facility];  Service: Pulmonary;;   CHOLECYSTECTOMY N/A 05/25/2015   Procedure: LAPAROSCOPIC CHOLECYSTECTOMY;  Surgeon: Axel Filler, MD;  Location: [facility] OR;  Service: General;  Laterality: N/A;   LAPAROSCOPIC CHOLECYSTECTOMY  05/25/2015   MENISCUS REPAIR Right 03/2017   VIDEO BRONCHOSCOPY WITH ENDOBRONCHIAL NAVIGATION N/A 04/24/2021   Procedure: VIDEO BRONCHOSCOPY WITH ENDOBRONCHIAL NAVIGATION;  Surgeon: Josephine Igo, DO;  Location: [facility];  Service: Pulmonary;  Laterality: N/A;   VIDEO BRONCHOSCOPY WITH RADIAL ENDOBRONCHIAL ULTRASOUND  04/24/2021   Procedure: VIDEO BRONCHOSCOPY WITH RADIAL ENDOBRONCHIAL ULTRASOUND;  Surgeon: Josephine Igo, DO;  Location: [facility];  Service: Pulmonary;;    I have reviewed the social history and family history with the patient and they are unchanged from previous note.  ALLERGIES:  is allergic to celebrex [celecoxib] and minocycline.  MEDICATIONS:  Current Outpatient Medications  Medication Sig Dispense Refill   acetaminophen (TYLENOL) 500 MG tablet Take 500 mg by mouth every 6 (six) hours as needed for moderate pain or headache.     albuterol (VENTOLIN HFA) 108 (90 Base) MCG/ACT inhaler Inhale 2 puffs into the lungs every 4 (four) hours as needed.     ALPRAZolam (XANAX) 0.25 MG tablet Take 0.25 mg by mouth every 4 (four) hours as needed for anxiety. for anxiety     aspirin EC 81 MG tablet Take 81 mg by mouth daily. Swallow whole.      citalopram (CELEXA) 10 MG tablet Take 10 mg by mouth at bedtime.     clindamycin (CLEOCIN T) 1 % external solution Apply topically 2 (two) times daily. 30 mL 0   Diclofenac Potassium,Migraine, 50 MG PACK Take 1 each by mouth as needed. As needed for migraines     diclofenac sodium (VOLTAREN) 1 % GEL Apply topically as needed. To affected area     diphenhydrAMINE (BENADRYL) 25 MG tablet Take 25 mg by mouth See admin instructions. 25 mg at bedtime 25 mg as needed for itching     Docusate Sodium (STOOL SOFTENER LAXATIVE PO) Take 1 tablet by mouth as needed.     eletriptan (RELPAX) 20 MG tablet Take 20 mg by mouth as needed for headache (at onset of migraine.). May repeat in 2 hours if needed.     fluticasone (FLONASE) 50 MCG/ACT nasal spray Place 2 sprays into both nostrils daily as needed for allergies.     hydrOXYzine (ATARAX/VISTARIL) 25 MG tablet Take 25 mg by mouth See admin instructions. 25 mg at bedtime 25 mg during the day as needed for itching/anxiety  loratadine (CLARITIN) 10 MG tablet Take 10 mg by mouth daily.     magic mouthwash (nystatin, lidocaine, diphenhydrAMINE, alum & mag hydroxide) suspension Swish and swallow 5 mls by mouth every 6 hours as needed 250 mL 1   magic mouthwash w/lidocaine SOLN Take 5 mLs by mouth every 6 hours as needed for mouth pain. Swish and Swallow 250 mL 1   montelukast (SINGULAIR) 10 MG tablet Take 10 mg by mouth at bedtime.     nystatin powder APPLY  POWDER TOPICALLY TO AFFECTED AREA THREE TIMES DAILY 60 g 1   ondansetron (ZOFRAN) 4 MG tablet Take 1 tablet (4 mg total) by mouth every 4 (four) hours as needed for nausea or vomiting. 12 tablet 0   ondansetron (ZOFRAN-ODT) 4 MG disintegrating tablet DISSOLVE 1 TABLET IN MOUTH EVERY 8 HOURS AS NEEDED FOR NAUSEA AND VOMITING 18 tablet 1   osimertinib mesylate (TAGRISSO) 80 MG tablet Take 1 tablet (80 mg total) by mouth daily. 30 tablet 3   prochlorperazine (COMPAZINE) 10 MG tablet TAKE 1 TABLET BY MOUTH EVERY  6 HOURS AS NEEDED FOR NAUSEA FOR VOMITING 30 tablet 1   solifenacin (VESICARE) 10 MG tablet Take 10 mg by mouth every evening.     zolpidem (AMBIEN) 10 MG tablet TAKE 1/2 TO 1 (ONE-HALF TO ONE) TABLET BY MOUTH AT BEDTIME AS NEEDED FOR SLEEP 30 tablet 0   No current facility-administered medications for this visit.    PHYSICAL EXAMINATION: ECOG PERFORMANCE STATUS: 2 - Symptomatic, <50% confined to bed  Vitals:   03/26/23 1319  BP: 119/76  Pulse: 67  Resp: 18  Temp: 97.7 F (36.5 C)  SpO2: 100%   Wt Readings from Last 3 Encounters:  03/26/23 259 lb 4.8 oz (117.6 kg)  02/05/23 259 lb 4.8 oz (117.6 kg)  11/28/22 262 lb 8 oz (119.1 kg)     GENERAL:alert, no distress and comfortable SKIN: skin color, texture, turgor are normal, no rashes or significant lesions EYES: normal, Conjunctiva are pink and non-injected, sclera clear NECK: supple, thyroid normal size, non-tender, without nodularity LYMPH:  no palpable lymphadenopathy in the cervical, axillary  LUNGS: clear to auscultation and percussion with normal breathing effort HEART: regular rate & rhythm and no murmurs and no lower extremity edema ABDOMEN:abdomen soft, non-tender and normal bowel sounds Musculoskeletal:no cyanosis of digits and no clubbing  NEURO: alert & oriented x 3 with fluent speech, no focal motor/sensory deficits   LABORATORY DATA:  I have reviewed the data as listed    Latest Ref Rng & Units 03/19/2023   12:46 PM 02/05/2023   12:58 PM 11/28/2022    1:50 PM  CBC  WBC 4.0 - 10.5 K/uL 3.0  3.4  3.2   Hemoglobin 12.0 - 15.0 g/dL 14.7  82.9  56.2   Hematocrit 36.0 - 46.0 % 36.8  34.4  33.4   Platelets 150 - 400 K/uL 176  160  158         Latest Ref Rng & Units 03/19/2023   12:46 PM 02/05/2023   12:58 PM 11/28/2022    1:50 PM  CMP  Glucose 70 - 99 mg/dL 88  130  865   BUN 6 - 20 mg/dL 21  18  19    Creatinine 0.44 - 1.00 mg/dL 7.84  6.96  2.95   Sodium 135 - 145 mmol/L 138  135  138   Potassium 3.5 - 5.1  mmol/L 4.5  4.4  4.5   Chloride 98 - 111 mmol/L  106  104  105   CO2 22 - 32 mmol/L 25  24  27    Calcium 8.9 - 10.3 mg/dL 9.4  9.2  9.4   Total Protein 6.5 - 8.1 g/dL 7.2  6.9  7.2   Total Bilirubin 0.3 - 1.2 mg/dL 0.4  0.5  0.5   Alkaline Phos 38 - 126 U/L 76  75  76   AST 15 - 41 U/L 16  16  16    ALT 0 - 44 U/L 11  12  12        RADIOGRAPHIC STUDIES: I have personally reviewed the radiological images as listed and agreed with the findings in the report. No results found.    No orders of the defined types were placed in this encounter.  All questions were answered. The patient knows to call the clinic with any problems, questions or concerns. No barriers to learning was detected. The total time spent in the appointment was 25 minutes.     Malachy Mood, MD 03/26/2023

## 2023-03-27 NOTE — Progress Notes (Signed)
Radiation Oncology         (336) [phone] ________________________________  Name: Abigail Golden MRN: [id number]  Date: 03/26/2023  DOB: 01/27/1966  SIMULATION AND TREATMENT PLANNING NOTE    ICD-10-CM   1. 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  NARRATIVE:  The patient was brought to the CT Simulation planning suite.  Identity was confirmed.  All relevant records and images related to the planned course of therapy were reviewed.  The patient freely provided informed written consent to proceed with treatment after reviewing the details related to the planned course of therapy. The consent form was witnessed and verified by the simulation staff. Intravenous access was established for contrast administration. Then, the patient was set-up in a stable reproducible supine position for radiation therapy.  A relocatable thermoplastic stereotactic head frame was fabricated for precise immobilization.  CT images were obtained.  Surface markings were placed.  The CT images were loaded into the planning software and fused with the patient's targeting MRI scan.  Then the target and avoidance structures were contoured.  Treatment planning then occurred.  The radiation prescription was entered and confirmed.  I have requested 3D planning  I have requested a DVH of the following structures: Brain stem, brain, left eye, right eye, lenses, optic chiasm, target volumes, uninvolved brain, and normal tissue.    SPECIAL TREATMENT PROCEDURE:  The planned course of therapy using radiation constitutes a special treatment procedure. Special care is required in the management of this patient for the following reasons. This treatment constitutes a Special Treatment Procedure for the following reason: High dose per fraction  requiring special monitoring for increased toxicities of treatment including daily imaging.  The special nature of the planned course of radiotherapy will require increased physician supervision and oversight to ensure patient's safety with optimal treatment outcomes.  This requires extended time and effort.  PLAN:  The patient will receive 20 Gy in 1 fraction.  ________________________________  Artist Pais Kathrynn Running, M.D.

## 2023-03-30 ENCOUNTER — Other Ambulatory Visit: Payer: Self-pay

## 2023-03-30 ENCOUNTER — Ambulatory Visit
Admission: RE | Admit: 2023-03-30 | Discharge: 2023-03-30 | Disposition: A | Discharge status: Home / Self Care | Payer: BC Managed Care – PPO | Source: Ambulatory Visit | Attending: Radiation Oncology | Admitting: Radiation Oncology

## 2023-03-30 VITALS — BP 127/51 | HR 67 | Temp 97.6°F | Resp 20

## 2023-03-30 DIAGNOSIS — Z510 Encounter for antineoplastic radiation therapy: Secondary | ICD-10-CM | POA: Insufficient documentation

## 2023-03-30 DIAGNOSIS — C7931 Secondary malignant neoplasm of brain: Secondary | ICD-10-CM | POA: Insufficient documentation

## 2023-03-30 DIAGNOSIS — C3412 Malignant neoplasm of upper lobe, left bronchus or lung: Secondary | ICD-10-CM | POA: Diagnosis not present

## 2023-03-30 LAB — RAD ONC ARIA SESSION SUMMARY
Course Elapsed Days: 0
Plan Fractions Treated to Date: 1
Plan Prescribed Dose Per Fraction: 20 Gy
Plan Total Fractions Prescribed: 1
Plan Total Prescribed Dose: 20 Gy
Reference Point Dosage Given to Date: 20 Gy
Reference Point Session Dosage Given: 20 Gy
Session Number: 1

## 2023-03-30 NOTE — Progress Notes (Signed)
Patient to clinic for observation SRS Brain 30 minutes.  Denies headaches, fatigue, visual changes, ringing in the ears, nausea.  Speech clear.  No Decadron orders.  Gait steady ambulates without assistive device.  Vitals: 97.6-67-20-127/51 O2 sat 100% room air.  Advised not to do anything strenuous for 24 hours.  Call if need [phone].

## 2023-03-31 ENCOUNTER — Other Ambulatory Visit: Payer: Self-pay

## 2023-03-31 NOTE — Progress Notes (Signed)
Specialty Pharmacy Refill Coordination Note  Abigail Golden is a 57 y.o. female contacted today regarding refills of specialty medication(s) Osimertinib Mesylate   Patient requested Delivery   Delivery date: 04/13/23   Verified address: [name], [place], [address]   Medication will be filled on 04/10/2023.

## 2023-04-01 NOTE — Radiation Completion Notes (Addendum)
Radiation Oncology         (336) [phone] ________________________________  Name: Abigail Golden MRN: [id number]  Date: 03/30/2023  DOB: 07/19/1965  Referring Physician: Mady Gemma, M.D. Date of Service: 2023-04-01 Radiation Oncologist: Margaretmary Bayley, M.D. [facility]     RADIATION ONCOLOGY END OF TREATMENT NOTE     Diagnosis:  57 yo woman with a punctate brain metastasis in the right frontal lobe, secondary to Stage IV NSCLC, left upper lung, adenocarcinoma, (T2b, N3, M1) EGFR L858R mutation (+).   Intent: Palliative     ==========DELIVERED PLANS==========  First Treatment Date: 2023-03-30 - Last Treatment Date: 2023-03-30   Plan Name: Brain_SRS Site: Brain Technique: SBRT/SRT-IMRT Mode: Photon Dose Per Fraction: 20 Gy Prescribed Dose (Delivered / Prescribed): 20 Gy / 20 Gy Prescribed Fxs (Delivered / Prescribed): 1 / 1     ==========ON TREATMENT VISIT DATES========== 2023-03-30   See weekly On Treatment Notes in Epic for details.  She tolerated the [facility] treatment well and was discharged home in stable condition.  The patient will receive a call in about one month from the radiation oncology department.  We will coordinate for a posttreatment MRI brain scan in January 2025, with a telephone follow-up visit thereafter to review the results and recommendations.  She will continue follow up with her medical oncologist, Dr. Mosetta Putt, as well.  ------------------------------------------------   Margaretmary Dys, MD [facility]  Radiation Oncology Direct Dial: [phone]  Fax: [phone] [email or web address]  Skype  LinkedIn

## 2023-04-07 ENCOUNTER — Telehealth: Payer: Self-pay | Admitting: *Deleted

## 2023-04-07 ENCOUNTER — Other Ambulatory Visit: Payer: Self-pay | Admitting: Internal Medicine

## 2023-04-07 ENCOUNTER — Telehealth: Payer: Self-pay | Admitting: Radiation Therapy

## 2023-04-07 MED ORDER — DEXAMETHASONE 4 MG PO TABS: 4.0000 mg | ORAL_TABLET | Freq: Every day | ORAL | 0 refills | Status: DC

## 2023-04-07 NOTE — Telephone Encounter (Signed)
PC to patient, informed her Dr Barbaraann Cao would like a phone visit with her to discuss how she is feeling after taking dexamethasone.  She verbalizes understanding, phone appointment is 04/13/23 at 11:00.

## 2023-04-07 NOTE — Telephone Encounter (Signed)
I left Abigail Golden a detailed message about Dr. Barbaraann Cao sending in the Rx for her to begin 4mg  dexamethasone once per day in response to her complaints of nausea and extreme fatigue since the completion of her recent SRS brain course. His team will be reaching out to set up a telephone follow-up with her next week to see if this provides relief or improvement of symptoms.   [name] R.T.(R)(T) Radiation Special Procedures Navigator

## 2023-04-10 ENCOUNTER — Other Ambulatory Visit: Payer: Self-pay | Admitting: Radiation Therapy

## 2023-04-10 DIAGNOSIS — C7931 Secondary malignant neoplasm of brain: Secondary | ICD-10-CM

## 2023-04-13 ENCOUNTER — Inpatient Hospital Stay (HOSPITAL_BASED_OUTPATIENT_CLINIC_OR_DEPARTMENT_OTHER): Payer: BC Managed Care – PPO | Admitting: Internal Medicine

## 2023-04-13 DIAGNOSIS — C7931 Secondary malignant neoplasm of brain: Secondary | ICD-10-CM | POA: Diagnosis not present

## 2023-04-13 NOTE — Progress Notes (Signed)
I connected with Abigail Golden on 04/13/23 at 11:00 AM EDT by telephone visit and verified that I am speaking with the correct person using two identifiers.  I discussed the limitations, risks, security and privacy concerns of performing an evaluation and management service by telemedicine and the availability of in-person appointments. I also discussed with the patient that there may be a patient responsible charge related to this service. The patient expressed understanding and agreed to proceed.   Other persons participating in the visit and their role in the encounter:  n/a   Patient's location:  Home Provider's location:  Office  Chief Complaint:  Malignant neoplasm metastatic to brain [facility])  History of Present Ilness: Abigail Golden reports improvement in nausea and fatigue since starting the 4mg  daily decadron on 04/07/23.  The drug has had a lot of side effects, however, including insomnia and irritability.  No other new or progressive changes.  Observations: Language and cognition at baseline  Assessment and Plan: Malignant neoplasm metastatic to brain [facility])  Clinically improved.  Recommended stopping decadron due to side effects, low burden of edema and small tumor burden.  Follow Up Instructions: RTC in December after MRI brain or as needed  I discussed the assessment and treatment plan with the patient.  The patient was provided an opportunity to ask questions and all were answered.  The patient agreed with the plan and demonstrated understanding of the instructions.    The patient was advised to call back or seek an in-person evaluation if the symptoms worsen or if the condition fails to improve as anticipated.    Henreitta Leber, MD   I provided 22 minutes of non face-to-face telephone visit time during this encounter, and > 50% was spent counseling as documented under my assessment & plan.

## 2023-04-15 NOTE — Op Note (Signed)
Name: Abigail Golden    MRN: [id number]   Date: 03/30/2023    DOB: 1965/09/06   STEREOTACTIC RADIOSURGERY OPERATIVE NOTE  PRE-OPERATIVE DIAGNOSIS:  Metastatic lung adenocarcinoma to the brain, 2 lesions  POST-OPERATIVE DIAGNOSIS:  Same  PROCEDURE:  Stereotactic Radiosurgery  SURGEON:  Monia Pouch, DO  RADIATION ONCOLOGIST: Dr. Kathrynn Running  TECHNIQUE:  The patient underwent a radiation treatment planning session in the radiation oncology simulation suite under the care of the radiation oncology physician and physicist.  I participated closely in the radiation treatment planning afterwards. The patient underwent planning CT which was fused to 3T high resolution MRI with 1 mm axial slices.  These images were fused on the planning system.  We contoured the gross target volumes and subsequently expanded this to yield the Planning Target Volume. I actively participated in the planning process.  I helped to define and review the target contours and also the contours of the optic pathway, eyes, brainstem and selected nearby organs at risk.  All the dose constraints for critical structures were reviewed and compared to AAPM Task Group 101.  The prescription dose conformity was reviewed.  I approved the plan electronically.    Accordingly, Abigail Golden  was brought to the TrueBeam stereotactic radiation treatment linac and placed in the custom immobilization mask.  The patient was aligned according to the IR fiducial markers with BrainLab Exactrac, then orthogonal x-rays were used in ExacTrac with the 6DOF robotic table and the shifts were made to align the patient.  Abigail Golden received stereotactic radiosurgery to a prescription dose of 20Gy uneventfully in 1 fraction to both lesions.    The detailed description of the procedure is recorded in the radiation oncology procedure note.  I was present for the duration of the procedure.  DISPOSITION:   Following delivery, the patient was transported to  nursing in stable condition and monitored for possible acute effects to be discharged to home in stable condition with follow-up in one month.  Monia Pouch, DO [facility]

## 2023-04-15 NOTE — Addendum Note (Signed)
Encounter addended by: Bethann Goo, DO on: 04/15/2023 9:36 AM  Actions taken: Clinical Note Signed

## 2023-04-17 NOTE — Addendum Note (Signed)
Encounter addended by: Marcello Fennel, PA-C on: 04/17/2023 4:17 PM  Actions taken: Clinical Note Signed

## 2023-04-20 ENCOUNTER — Other Ambulatory Visit: Payer: Self-pay | Admitting: Radiation Therapy

## 2023-04-21 ENCOUNTER — Other Ambulatory Visit: Payer: Self-pay | Admitting: Radiation Therapy

## 2023-04-25 LAB — COLOGUARD: COLOGUARD: NEGATIVE

## 2023-05-04 ENCOUNTER — Other Ambulatory Visit: Payer: Self-pay

## 2023-05-04 NOTE — Progress Notes (Signed)
Specialty Pharmacy Refill Coordination Note  Abigail Golden is a 57 y.o. female contacted today regarding refills of specialty medication(s) Osimertinib Mesylate   Patient requested Delivery   Delivery date: 05/18/23   Verified address: [address] [place], [address]   Medication will be filled on 05/15/23.

## 2023-05-05 ENCOUNTER — Ambulatory Visit
Admission: RE | Admit: 2023-05-05 | Discharge: 2023-05-05 | Disposition: A | Discharge status: Home / Self Care | Payer: BC Managed Care – PPO | Source: Ambulatory Visit | Attending: Hematology | Admitting: Hematology

## 2023-05-05 NOTE — Progress Notes (Signed)
  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 (as documented in provider EOT note)  The patient was available for call today.  The patient did note fatigue during radiation but has improved. The patient did not note hair loss or skin changes in the field of radiation during therapy. The patient is not taking dexamethasone. The patient does not have symptoms of  weakness or loss of control of the extremities. The patient does not have symptoms of headache. The patient does not have symptoms of seizure or uncontrolled movement. The patient does not have symptoms of changes in vision. The patient does not have changes in speech. The patient does not have confusion.   The patient was counseled that she will be contacted by our brain and spine navigator to schedule surveillance imaging. The patient was encouraged to call if she have not received a call to schedule imaging, or if she develops concerns or questions regarding radiation. The patient will also continue to follow up with Dr. Mosetta Putt in medical oncology.  This concludes the interaction.  Ruel Favors, LPN

## 2023-05-07 ENCOUNTER — Encounter: Payer: Self-pay | Admitting: Hematology

## 2023-05-07 ENCOUNTER — Other Ambulatory Visit: Payer: Self-pay

## 2023-05-07 ENCOUNTER — Other Ambulatory Visit (HOSPITAL_COMMUNITY): Payer: Self-pay

## 2023-05-07 ENCOUNTER — Inpatient Hospital Stay: Payer: BC Managed Care – PPO | Admitting: Hematology

## 2023-05-07 ENCOUNTER — Inpatient Hospital Stay: Payer: BC Managed Care – PPO | Attending: Hematology

## 2023-05-07 VITALS — BP 129/59 | HR 75 | Temp 97.5°F | Resp 20 | Ht 60.0 in | Wt 261.4 lb

## 2023-05-07 DIAGNOSIS — C3412 Malignant neoplasm of upper lobe, left bronchus or lung: Secondary | ICD-10-CM

## 2023-05-07 DIAGNOSIS — Z923 Personal history of irradiation: Secondary | ICD-10-CM | POA: Diagnosis not present

## 2023-05-07 DIAGNOSIS — Z79899 Other long term (current) drug therapy: Secondary | ICD-10-CM | POA: Diagnosis not present

## 2023-05-07 DIAGNOSIS — Z7982 Long term (current) use of aspirin: Secondary | ICD-10-CM | POA: Insufficient documentation

## 2023-05-07 DIAGNOSIS — Z452 Encounter for adjustment and management of vascular access device: Secondary | ICD-10-CM | POA: Insufficient documentation

## 2023-05-07 DIAGNOSIS — C7931 Secondary malignant neoplasm of brain: Secondary | ICD-10-CM | POA: Insufficient documentation

## 2023-05-07 LAB — CMP (CANCER CENTER ONLY)
ALT: 14 U/L (ref 0–44)
AST: 17 U/L (ref 15–41)
Albumin: 4.3 g/dL (ref 3.5–5.0)
Alkaline Phosphatase: 76 U/L (ref 38–126)
Anion gap: 5 (ref 5–15)
BUN: 19 mg/dL (ref 6–20)
CO2: 29 mmol/L (ref 22–32)
Calcium: 9.8 mg/dL (ref 8.9–10.3)
Chloride: 104 mmol/L (ref 98–111)
Creatinine: 1.25 mg/dL — ABNORMAL HIGH (ref 0.44–1.00)
GFR, Estimated: 51 mL/min — ABNORMAL LOW (ref >60)
Glucose, Bld: 95 mg/dL (ref 70–99)
Potassium: 4.5 mmol/L (ref 3.5–5.1)
Sodium: 138 mmol/L (ref 135–145)
Total Bilirubin: 0.5 mg/dL (ref <1.2)
Total Protein: 7.4 g/dL (ref 6.5–8.1)

## 2023-05-07 LAB — CBC WITH DIFFERENTIAL (CANCER CENTER ONLY)
Abs Immature Granulocytes: 0.01 10*3/uL (ref 0.00–0.07)
Basophils Absolute: 0 10*3/uL (ref 0.0–0.1)
Basophils Relative: 0 %
Eosinophils Absolute: 0 10*3/uL (ref 0.0–0.5)
Eosinophils Relative: 0 %
HCT: 36 % (ref 36.0–46.0)
Hemoglobin: 12 g/dL (ref 12.0–15.0)
Immature Granulocytes: 0 %
Lymphocytes Relative: 27 %
Lymphs Abs: 1 10*3/uL (ref 0.7–4.0)
MCH: 28.8 pg (ref 26.0–34.0)
MCHC: 33.3 g/dL (ref 30.0–36.0)
MCV: 86.3 fL (ref 80.0–100.0)
Monocytes Absolute: 0.6 10*3/uL (ref 0.1–1.0)
Monocytes Relative: 16 %
Neutro Abs: 2.2 10*3/uL (ref 1.7–7.7)
Neutrophils Relative %: 57 %
Platelet Count: 186 10*3/uL (ref 150–400)
RBC: 4.17 MIL/uL (ref 3.87–5.11)
RDW: 13.5 % (ref 11.5–15.5)
WBC Count: 3.8 10*3/uL — ABNORMAL LOW (ref 4.0–10.5)
nRBC: 0 % (ref 0.0–0.2)

## 2023-05-07 MED ORDER — OSIMERTINIB MESYLATE 80 MG PO TABS
80.0000 mg | ORAL_TABLET | Freq: Every day | ORAL | 3 refills | Status: DC
  Filled 2023-05-07 – 2023-05-18 (×2): qty 30, 30d supply, fill #0
  Filled 2023-06-09: qty 30, 30d supply, fill #1
  Filled 2023-07-13: qty 30, 30d supply, fill #2
  Filled 2023-08-10: qty 30, 30d supply, fill #3

## 2023-05-07 NOTE — Progress Notes (Signed)
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 She tolerates moderately well with mucositis and skin toxicities.   -she had thoracic radiation (The primary tumor and involved mediastinal adenopathy were treated to 66 Gy in 33 fractions of 2 Gy), completed on 11/04/2021 - her recent restaging CT scan from May 22, 2022 showed continued response in the primary site and thoracic adenopathy, or new lesions. -She is clinically doing well, lives independently, tolerating Tagrisso well, will continue. - restaging CT from 03/19/2023 showed post radiation change in lung and no evidence of recurrence. Will continue Tagrisso  -she we will continue follow-up with his radiation oncologist Dr. Kathrynn Running for her brain metastasis, as single fraction SRS was recommended based on her recent brain MRI from March 18, 2023   Assessment and Plan    Metastatic Lung Cancer Patient with metastatic lung cancer on Tagrisso (osimertinib). Recent SRS radiation for brain metastases on October 7th, treated two spots. Post-radiation, experienced hair loss and mild headaches, now resolved. Blood counts show slight leukopenia likely due to Tagrisso. Kidney and liver functions are well-managed. Recent CT scan in September; next scan scheduled for end of December. Discussed hydration to manage creatinine levels and contrast clearance. Patient prefers CT  scan before year-end to meet deductible. - Refill Tagrisso prescription - Order CT scan with contrast for end of December - Scheduled brain MRI for December 27 - Ensure adequate hydration for creatinine management and contrast clearance - Follow-up appointment in the first week of January  Hernia Intermittent sharp abdominal pain, likely related to previously identified hernia on CT scan. No constipation reported. Discussed avoiding constipation to prevent pain exacerbation. - Monitor abdominal pain - Avoid constipation with stool softeners, increased water intake, and dietary fiber  Post-Vaccination Reaction Severe headache and nausea following COVID-19 vaccination on November 1st, lasting three days, resolved with rest. Discussed importance of pneumonia vaccination for lung cancer patients to prevent severe respiratory infections. Patient concerned about pneumonia vaccine after adverse reaction to COVID-19 vaccine. - Monitor for future adverse reactions to vaccinations - Administer pneumonia vaccine at an appropriate time post-recovery from recent vaccination reaction.      Plan -continue Tagrisso  -f/u second week of Jan with lab and CT CAP W contrast around 06/22/23    SUMMARY OF ONCOLOGIC HISTORY: Oncology History Overview Note   Cancer Staging  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) Staging form: Lung, AJCC 8th Edition - Clinical stage from 04/24/2021: Stage IVA (cT2, cN2, pM1b) - Signed by Malachy Mood, MD on 05/14/2021    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)  04/22/2021 Imaging   CT HEAD WO CONTRAST   IMPRESSION: Two separate areas of vasogenic edema within the left hemisphere, 1 within the inferior frontal region in the other at the left parietal vertex. Metastatic disease would be the most likely cause of this appearance. Other possibilities include venous infarctions and cerebritis. MRI with contrast recommended.  04/23/2021 Imaging    MR Brain W and Wo Contrast  IMPRESSION: 1. Metastatic disease to the brain. Two enhancing brain masses with moderate associated vasogenic edema: solid 16 mm left parietal lobe mass, and a larger 29 mm rim enhancing cystic or necrotic mass in the left inferior frontal gyrus. Two other small 2-3 mm metastases in the right inferior frontal gyrus, right posterior temporal lobe. And questionable two additional punctate metastases in both superior frontal gyri abutting the falx (series 18, image 46). 2. No significant intracranial mass effect. No other intracranial abnormality.   04/23/2021 Imaging   CT Chest W Contrast  IMPRESSION: 4.5 cm spiculated left upper lobe mass most compatible with primary lung cancer. This abuts the medial pleural surface. A few small adjacent pre-vascular mediastinal lymph nodes, none pathologically enlarged. Recommend cardiothoracic surgical and oncologic consultation.   No acute findings or evidence of metastatic disease in the abdomen or pelvis.   04/24/2021 Cancer Staging   Staging form: Lung, AJCC 8th Edition - Clinical stage from 04/24/2021: Stage IVA (cT2, cN2, pM1b) - Signed by Malachy Mood, MD on 05/14/2021   04/24/2021 Initial Biopsy   FINAL MICROSCOPIC DIAGNOSIS:   A. LUNG, LUL, FINE NEEDLE ASPIRATION:  - Malignant cells present, consistent with adenocarcinoma   B. LUNG, LUL, BRUSHING:  - Malignant cells consistent with adenocarcinoma, see comment    COMMENT:  B.  Immunohistochemical stains show that the tumor cells are positive for TTF-1 while they are negative for p63 and CK5/6, consistent with above interpretation. The number of tumor cells appears to be insufficient for molecular studies.   05/03/2021 Initial Diagnosis   Lung cancer (HCC)   05/08/2021 - 05/13/2021 Radiation Therapy   SRS treatment to the metastatic brain lesions under the care of Dr. Kathrynn Running    08/05/2021 Imaging   EXAM: CT CHEST, ABDOMEN, AND PELVIS WITH  CONTRAST  IMPRESSION: 1. No substantial change in size of the left upper lobe pulmonary lesion. 2. Borderline to mildly enlarged adjacent prevascular/AP window lymph nodes have increased in size in the interval. This finding raises concern for disease progression. 3. Interval development of upper normal bilateral common iliac and left external iliac lymph nodes. Close attention on follow-up recommended. 4. No other evidence for metastatic disease in the abdomen or pelvis.   09/22/2022 Imaging    IMPRESSION: 1. Area of chronic postradiation mass-like fibrosis in the posteromedial aspect of the left upper lung, without definitive findings of locally recurrent disease or definite metastatic disease in the chest, abdomen or pelvis. 2. Mild atherosclerosis. 3. Additional incidental findings, as above.     03/19/2023 Imaging   CT chest,abdomen, and pelvis with contrast IMPRESSION: 1. Unchanged appearance of posterior paramedian left upper lobe with dense post treatment fibrosis and volume loss. 2. No evidence of recurrent or metastatic disease in the chest, abdomen, or pelvis. 3. Mildly coarse contour of the liver, suggestive of cirrhosis. Correlate with biochemical findings. 4. Status post cholecystectomy and hysterectomy.  Aortic Atherosclerosis (ICD10-       Discussed the use of AI scribe software for clinical note transcription with the patient, who gave verbal consent to proceed.  History of Present Illness   The patient, a 57 year old with a history of metastatic lung cancer, presents for a follow-up visit. She recently underwent stereotactic radiosurgery [facility] for two brain metastases, one on each side. She reports some hair loss in the irradiated areas, but overall, she tolerated the treatment well and did not experience significant side effects.  Following  the radiation therapy, she was put on a short course of steroids, which she has since completed. She reports feeling  better after stopping the steroids.  The patient also discusses her experience with the COVID-19 vaccine, which she received on November 1st. She reports that the vaccine made her feel very ill, with severe headaches and nausea lasting for three days. She had to rest and sleep off the side effects.  She also mentions that her family doctor has recommended a pneumonia vaccine, but she is hesitant to get it due to her recent experience with the COVID-19 vaccine.  The patient is currently on Tagrisso for her lung cancer, and she reports no problems with this medication. She is due for a refill, which is scheduled to be delivered on November 25th.  She also mentions a slight pain in her abdomen, which she suspects might be related to a hernia identified in her last CT scan. She reports her bowel movements are regular, and she takes a stool softener if needed.  The patient is quite active, involved in her church, and does some dog sitting and tutoring. She lives alone and manages her daily activities independently.         All other systems were reviewed with the patient and are negative.  MEDICAL HISTORY:  Past Medical History:  Diagnosis Date   Allergy    Anxiety    Chronic bronchitis (HCC)    Depression    Headache    Hypoglycemia    Lower back pain    since fall ~ 2012 (05/25/2015)   Migraine    05/25/2015 "probably once/month"   Migraine headache    Pain in left hip    since fall ~ 2012 (05/25/2015)   Pneumonia "several times"    SURGICAL HISTORY: Past Surgical History:  Procedure Laterality Date   ABDOMINAL HYSTERECTOMY  11/2002   "found benign tumors"   BRONCHIAL BIOPSY  04/24/2021   Procedure: BRONCHIAL BIOPSIES;  Surgeon: Josephine Igo, DO;  Location: [facility];  Service: Pulmonary;;   BRONCHIAL BRUSHINGS  04/24/2021   Procedure: BRONCHIAL BRUSHINGS;  Surgeon: Josephine Igo, DO;  Location: [facility];  Service: Pulmonary;;   BRONCHIAL NEEDLE ASPIRATION BIOPSY   04/24/2021   Procedure: BRONCHIAL NEEDLE ASPIRATION BIOPSIES;  Surgeon: Josephine Igo, DO;  Location: [facility];  Service: Pulmonary;;   CHEST TUBE INSERTION  04/24/2021   Procedure: CHEST TUBE INSERTION;  Surgeon: Josephine Igo, DO;  Location: [facility];  Service: Pulmonary;;   CHOLECYSTECTOMY N/A 05/25/2015   Procedure: LAPAROSCOPIC CHOLECYSTECTOMY;  Surgeon: Axel Filler, MD;  Location: [facility] OR;  Service: General;  Laterality: N/A;   LAPAROSCOPIC CHOLECYSTECTOMY  05/25/2015   MENISCUS REPAIR Right 03/2017   VIDEO BRONCHOSCOPY WITH ENDOBRONCHIAL NAVIGATION N/A 04/24/2021   Procedure: VIDEO BRONCHOSCOPY WITH ENDOBRONCHIAL NAVIGATION;  Surgeon: Josephine Igo, DO;  Location: [facility];  Service: Pulmonary;  Laterality: N/A;   VIDEO BRONCHOSCOPY WITH RADIAL ENDOBRONCHIAL ULTRASOUND  04/24/2021   Procedure: VIDEO BRONCHOSCOPY WITH RADIAL ENDOBRONCHIAL ULTRASOUND;  Surgeon: Josephine Igo, DO;  Location: [facility];  Service: Pulmonary;;    I have reviewed the social history and family history with the patient and they are unchanged from previous note.  ALLERGIES:  is allergic to celebrex [celecoxib] and minocycline.  MEDICATIONS:  Current Outpatient Medications  Medication Sig Dispense Refill   acetaminophen (TYLENOL) 500 MG tablet Take 500 mg by mouth every 6 (six) hours as needed for moderate pain or headache.  albuterol (VENTOLIN HFA) 108 (90 Base) MCG/ACT inhaler Inhale 2 puffs into the lungs every 4 (four) hours as needed.     ALPRAZolam (XANAX) 0.25 MG tablet Take 0.25 mg by mouth every 4 (four) hours as needed for anxiety. for anxiety     aspirin EC 81 MG tablet Take 81 mg by mouth daily. Swallow whole.     citalopram (CELEXA) 10 MG tablet Take 10 mg by mouth at bedtime.     Diclofenac Potassium,Migraine, 50 MG PACK Take 1 each by mouth as needed. As needed for migraines     diclofenac sodium (VOLTAREN) 1 % GEL Apply topically as needed. To affected area      diphenhydrAMINE (BENADRYL) 25 MG tablet Take 25 mg by mouth See admin instructions. 25 mg at bedtime 25 mg as needed for itching     Docusate Sodium (STOOL SOFTENER LAXATIVE PO) Take 1 tablet by mouth as needed.     eletriptan (RELPAX) 20 MG tablet Take 20 mg by mouth as needed for headache (at onset of migraine.). May repeat in 2 hours if needed.     fluticasone (FLONASE) 50 MCG/ACT nasal spray Place 2 sprays into both nostrils daily as needed for allergies.     hydrOXYzine (ATARAX/VISTARIL) 25 MG tablet Take 25 mg by mouth See admin instructions. 25 mg at bedtime 25 mg during the day as needed for itching/anxiety     loratadine (CLARITIN) 10 MG tablet Take 10 mg by mouth daily.     magic mouthwash w/lidocaine SOLN Take 5 mLs by mouth every 6 hours as needed for mouth pain. Swish and Swallow 250 mL 1   montelukast (SINGULAIR) 10 MG tablet Take 10 mg by mouth at bedtime.     nystatin powder APPLY  POWDER TOPICALLY TO AFFECTED AREA THREE TIMES DAILY 60 g 1   ondansetron (ZOFRAN) 4 MG tablet Take 1 tablet (4 mg total) by mouth every 4 (four) hours as needed for nausea or vomiting. 12 tablet 0   ondansetron (ZOFRAN-ODT) 4 MG disintegrating tablet DISSOLVE 1 TABLET IN MOUTH EVERY 8 HOURS AS NEEDED FOR NAUSEA AND VOMITING 18 tablet 1   osimertinib mesylate (TAGRISSO) 80 MG tablet Take 1 tablet (80 mg total) by mouth daily. 30 tablet 3   prochlorperazine (COMPAZINE) 10 MG tablet TAKE 1 TABLET BY MOUTH EVERY 6 HOURS AS NEEDED FOR NAUSEA FOR VOMITING 30 tablet 1   solifenacin (VESICARE) 10 MG tablet Take 10 mg by mouth every evening.     zolpidem (AMBIEN) 10 MG tablet TAKE 1/2 TO 1 (ONE-HALF TO ONE) TABLET BY MOUTH AT BEDTIME AS NEEDED FOR SLEEP 30 tablet 0   No current facility-administered medications for this visit.    PHYSICAL EXAMINATION: ECOG PERFORMANCE STATUS: 1 - Symptomatic but completely ambulatory  Vitals:   05/07/23 1329  BP: (!) 129/59  Pulse: 75  Resp: 20  Temp: (!) 97.5 F (36.4  C)  SpO2: 100%   Wt Readings from Last 3 Encounters:  05/07/23 261 lb 6.4 oz (118.6 kg)  03/26/23 259 lb 4.8 oz (117.6 kg)  02/05/23 259 lb 4.8 oz (117.6 kg)     GENERAL:alert, no distress and comfortable SKIN: skin color, texture, turgor are normal, no rashes or significant lesions EYES: normal, Conjunctiva are pink and non-injected, sclera clear NECK: supple, thyroid normal size, non-tender, without nodularity LYMPH:  no palpable lymphadenopathy in the cervical, axillary  LUNGS: clear to auscultation and percussion with normal breathing effort HEART: regular rate & rhythm and no  murmurs and no lower extremity edema ABDOMEN:abdomen soft, non-tender and normal bowel sounds Musculoskeletal:no cyanosis of digits and no clubbing  NEURO: alert & oriented x 3 with fluent speech, no focal motor/sensory deficits   LABORATORY DATA:  I have reviewed the data as listed    Latest Ref Rng & Units 05/07/2023   12:54 PM 03/19/2023   12:46 PM 02/05/2023   12:58 PM  CBC  WBC 4.0 - 10.5 K/uL 3.8  3.0  3.4   Hemoglobin 12.0 - 15.0 g/dL 76.2  83.1  51.7   Hematocrit 36.0 - 46.0 % 36.0  36.8  34.4   Platelets 150 - 400 K/uL 186  176  160         Latest Ref Rng & Units 05/07/2023   12:54 PM 03/19/2023   12:46 PM 02/05/2023   12:58 PM  CMP  Glucose 70 - 99 mg/dL 95  88  616   BUN 6 - 20 mg/dL 19  21  18    Creatinine 0.44 - 1.00 mg/dL 0.73  7.10  6.26   Sodium 135 - 145 mmol/L 138  138  135   Potassium 3.5 - 5.1 mmol/L 4.5  4.5  4.4   Chloride 98 - 111 mmol/L 104  106  104   CO2 22 - 32 mmol/L 29  25  24    Calcium 8.9 - 10.3 mg/dL 9.8  9.4  9.2   Total Protein 6.5 - 8.1 g/dL 7.4  7.2  6.9   Total Bilirubin <1.2 mg/dL 0.5  0.4  0.5   Alkaline Phos 38 - 126 U/L 76  76  75   AST 15 - 41 U/L 17  16  16    ALT 0 - 44 U/L 14  11  12        RADIOGRAPHIC STUDIES: I have personally reviewed the radiological images as listed and agreed with the findings in the report. No results found.     Orders Placed This Encounter  Procedures   CT CHEST ABDOMEN PELVIS W CONTRAST    HOLD IV contrast if EGFR<50    Standing Status:   Future    Standing Expiration Date:   05/06/2024    Order Specific Question:   If indicated for the ordered procedure, I authorize the administration of contrast media per Radiology protocol    Answer:   Yes    Order Specific Question:   Does the patient have a contrast media/X-ray dye allergy?    Answer:   No    Order Specific Question:   Preferred imaging location?    Answer:   [facility]    Order Specific Question:   If indicated for the ordered procedure, I authorize the administration of oral contrast media per Radiology protocol    Answer:   Yes   All questions were answered. The patient knows to call the clinic with any problems, questions or concerns. No barriers to learning was detected. The total time spent in the appointment was 25 minutes.     Malachy Mood, MD 05/07/2023

## 2023-05-07 NOTE — Assessment & Plan Note (Signed)
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 She tolerates moderately well with mucositis and skin toxicities.   -she had thoracic radiation (The primary tumor and involved mediastinal adenopathy were treated to 66 Gy in 33 fractions of 2 Gy), completed on 11/04/2021 - her recent restaging CT scan from May 22, 2022 showed continued response in the primary site and thoracic adenopathy, or new lesions. -She is clinically doing well, lives independently, tolerating Tagrisso well, will continue. - restaging CT from 03/19/2023 showed post radiation change in lung and no evidence of recurrence. Will continue Tagrisso  -she we will continue follow-up with his radiation oncologist Dr. Kathrynn Running for her brain metastasis, as single fraction SRS was recommended based on her recent brain MRI from March 18, 2023

## 2023-05-18 ENCOUNTER — Other Ambulatory Visit: Payer: Self-pay

## 2023-05-18 NOTE — Progress Notes (Signed)
Patient called regarding Tagrisso delivery. Medication was originally scheduled with rx number [id number] for delivery on 05/18/23 but the rx was discontinued and renewed with rx number [id number]. New rx was profiled in error. Patient has enough medication to last until 11/30 and is okay to receive medication on 11/29. Tagrisso also needs a PA renewal, patient is aware and will be updated with any delays. Sent information to [facility] for PA renewal.

## 2023-05-19 ENCOUNTER — Other Ambulatory Visit: Payer: Self-pay

## 2023-05-19 ENCOUNTER — Telehealth: Payer: Self-pay | Admitting: Pharmacy Technician

## 2023-05-19 ENCOUNTER — Other Ambulatory Visit (HOSPITAL_COMMUNITY): Payer: Self-pay

## 2023-05-19 NOTE — Telephone Encounter (Signed)
Oral Oncology Patient Advocate Encounter   Received notification that prior authorization for Tagrisso is due for renewal.   PA submitted on 05/19/23 Key [id number] Status is pending     Jinger Neighbors, CPhT-Adv Oncology Pharmacy Patient Advocate [facility] Direct Number: [phone]  Fax: [phone]

## 2023-05-19 NOTE — Telephone Encounter (Signed)
Oral Oncology Patient Advocate Encounter  Prior Authorization for [name] has been approved.    PA# [id number] Effective dates: 05/17/23 through 05/17/24  Patient will continue to follow at [facility].    Jinger Neighbors, CPhT-Adv Oncology Pharmacy Patient Advocate [facility] Direct Number: [phone]  Fax: [phone]

## 2023-06-08 ENCOUNTER — Telehealth: Payer: Self-pay

## 2023-06-08 ENCOUNTER — Other Ambulatory Visit: Payer: Self-pay | Admitting: Hematology

## 2023-06-08 MED ORDER — ZOLPIDEM TARTRATE 10 MG PO TABS: ORAL_TABLET | ORAL | 0 refills | Status: DC

## 2023-06-08 NOTE — Telephone Encounter (Signed)
Patient called in needing a refill on her Zolpidem could you please do a refill and send to her pharmacy.

## 2023-06-09 ENCOUNTER — Other Ambulatory Visit: Payer: Self-pay

## 2023-06-09 ENCOUNTER — Encounter: Payer: Self-pay | Admitting: Hematology

## 2023-06-09 NOTE — Progress Notes (Signed)
Specialty Pharmacy Refill Coordination Note  Abigail Golden is a 57 y.o. female contacted today regarding refills of specialty medication(s) Osimertinib Mesylate [name])   Patient requested Daryll Drown at [facility] [place] date: 06/22/23   Medication will be filled on 12.27.24.

## 2023-06-15 NOTE — Progress Notes (Signed)
Telephone nursing appointment for . I verified patient's identity x2 and began nursing interview.     Abigail Golden was called today for a follow up post radiation to the brain.    Does the patient complain of any of the following: Headache: She reports having a major headache after having her MRI and the day after. Visual Changes: Denies Hearing Changes: Denies Nausea: Denies Vomiting: Denies Balance or coordination issues: Denies Memory issues: Denies  Is the patient currently on a Decadron regimen? : She is no longer taking Decadron.      Meaningful use complete.   Patient aware of their telephone appointment w/ Bryan Lemma PA-C. I left my extension [phone] in case patient needs anything. Patient verbalized understanding. This concludes the nursing interview.   Patient contact [phone]

## 2023-06-19 ENCOUNTER — Ambulatory Visit (HOSPITAL_COMMUNITY)
Admission: RE | Admit: 2023-06-19 | Discharge: 2023-06-19 | Disposition: A | Discharge status: Home / Self Care | Payer: BC Managed Care – PPO | Source: Ambulatory Visit | Attending: Radiation Oncology | Admitting: Radiation Oncology

## 2023-06-19 DIAGNOSIS — C7931 Secondary malignant neoplasm of brain: Secondary | ICD-10-CM | POA: Diagnosis present

## 2023-06-19 MED ORDER — GADOBUTROL 1 MMOL/ML IV SOLN
10.0000 mL | Freq: Once | INTRAVENOUS | Status: AC | PRN
  Administered 2023-06-19: 10 mL via INTRAVENOUS

## 2023-06-22 ENCOUNTER — Inpatient Hospital Stay: Payer: BC Managed Care – PPO

## 2023-06-22 ENCOUNTER — Encounter: Payer: Self-pay | Admitting: Radiology

## 2023-06-22 ENCOUNTER — Encounter: Payer: Self-pay | Admitting: Hematology

## 2023-06-22 ENCOUNTER — Ambulatory Visit: Payer: BC Managed Care – PPO | Admitting: Radiology

## 2023-06-22 ENCOUNTER — Other Ambulatory Visit: Payer: BC Managed Care – PPO

## 2023-06-22 ENCOUNTER — Ambulatory Visit
Admission: RE | Admit: 2023-06-22 | Discharge: 2023-06-22 | Disposition: A | Discharge status: Home / Self Care | Payer: BC Managed Care – PPO | Source: Ambulatory Visit | Attending: Radiology | Admitting: Radiology

## 2023-06-22 ENCOUNTER — Inpatient Hospital Stay: Payer: BC Managed Care – PPO | Attending: Hematology

## 2023-06-22 ENCOUNTER — Ambulatory Visit (HOSPITAL_COMMUNITY)
Admission: RE | Admit: 2023-06-22 | Discharge: 2023-06-22 | Disposition: A | Discharge status: Home / Self Care | Payer: BC Managed Care – PPO | Source: Ambulatory Visit | Attending: Hematology | Admitting: Hematology

## 2023-06-22 DIAGNOSIS — C3412 Malignant neoplasm of upper lobe, left bronchus or lung: Secondary | ICD-10-CM | POA: Insufficient documentation

## 2023-06-22 DIAGNOSIS — C7931 Secondary malignant neoplasm of brain: Secondary | ICD-10-CM | POA: Insufficient documentation

## 2023-06-22 DIAGNOSIS — Z90710 Acquired absence of both cervix and uterus: Secondary | ICD-10-CM | POA: Diagnosis not present

## 2023-06-22 DIAGNOSIS — Z9049 Acquired absence of other specified parts of digestive tract: Secondary | ICD-10-CM | POA: Insufficient documentation

## 2023-06-22 LAB — CBC WITH DIFFERENTIAL (CANCER CENTER ONLY)
Abs Immature Granulocytes: 0 10*3/uL (ref 0.00–0.07)
Basophils Absolute: 0 10*3/uL (ref 0.0–0.1)
Basophils Relative: 0 %
Eosinophils Absolute: 0 10*3/uL (ref 0.0–0.5)
Eosinophils Relative: 0 %
HCT: 34.1 % — ABNORMAL LOW (ref 36.0–46.0)
Hemoglobin: 11.5 g/dL — ABNORMAL LOW (ref 12.0–15.0)
Immature Granulocytes: 0 %
Lymphocytes Relative: 26 %
Lymphs Abs: 1.1 10*3/uL (ref 0.7–4.0)
MCH: 28.8 pg (ref 26.0–34.0)
MCHC: 33.7 g/dL (ref 30.0–36.0)
MCV: 85.3 fL (ref 80.0–100.0)
Monocytes Absolute: 0.4 10*3/uL (ref 0.1–1.0)
Monocytes Relative: 9 %
Neutro Abs: 2.8 10*3/uL (ref 1.7–7.7)
Neutrophils Relative %: 65 %
Platelet Count: 177 10*3/uL (ref 150–400)
RBC: 4 MIL/uL (ref 3.87–5.11)
RDW: 13.2 % (ref 11.5–15.5)
WBC Count: 4.3 10*3/uL (ref 4.0–10.5)
nRBC: 0 % (ref 0.0–0.2)

## 2023-06-22 LAB — CMP (CANCER CENTER ONLY)
ALT: 15 U/L (ref 0–44)
AST: 17 U/L (ref 15–41)
Albumin: 4 g/dL (ref 3.5–5.0)
Alkaline Phosphatase: 83 U/L (ref 38–126)
Anion gap: 7 (ref 5–15)
BUN: 21 mg/dL — ABNORMAL HIGH (ref 6–20)
CO2: 25 mmol/L (ref 22–32)
Calcium: 9.5 mg/dL (ref 8.9–10.3)
Chloride: 105 mmol/L (ref 98–111)
Creatinine: 1.18 mg/dL — ABNORMAL HIGH (ref 0.44–1.00)
GFR, Estimated: 54 mL/min — ABNORMAL LOW (ref >60)
Glucose, Bld: 118 mg/dL — ABNORMAL HIGH (ref 70–99)
Potassium: 4.1 mmol/L (ref 3.5–5.1)
Sodium: 137 mmol/L (ref 135–145)
Total Bilirubin: 0.4 mg/dL (ref 0.0–1.2)
Total Protein: 7.2 g/dL (ref 6.5–8.1)

## 2023-06-22 MED ORDER — IOHEXOL 300 MG/ML  SOLN
100.0000 mL | Freq: Once | INTRAMUSCULAR | Status: AC | PRN
  Administered 2023-06-22: 100 mL via INTRAVENOUS

## 2023-06-26 ENCOUNTER — Other Ambulatory Visit: Payer: Self-pay | Admitting: Radiation Therapy

## 2023-06-26 DIAGNOSIS — C7931 Secondary malignant neoplasm of brain: Secondary | ICD-10-CM

## 2023-07-01 ENCOUNTER — Other Ambulatory Visit: Payer: Self-pay | Admitting: Family Medicine

## 2023-07-01 DIAGNOSIS — Z1231 Encounter for screening mammogram for malignant neoplasm of breast: Secondary | ICD-10-CM

## 2023-07-05 NOTE — Assessment & Plan Note (Signed)
 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 She tolerates moderately well with mucositis and skin toxicities.   -she had thoracic radiation (The primary tumor and involved mediastinal adenopathy were treated to 66 Gy in 33 fractions of 2 Gy), completed on 11/04/2021 - her recent restaging CT scan from May 22, 2022 showed continued response in the primary site and thoracic adenopathy, or new lesions. -She is clinically doing well, lives independently, tolerating Tagrisso well, will continue. - restaging CT from 03/19/2023 showed post radiation change in lung and no evidence of recurrence. Will continue Tagrisso  -she we will continue follow-up with his radiation oncologist Dr. Kathrynn Running for her brain metastasis, as single fraction SRS was recommended based on her recent brain MRI from March 18, 2023 -restaging CT CAP 06/22/2023 showed stable diease, no progression

## 2023-07-06 ENCOUNTER — Ambulatory Visit: Payer: BC Managed Care – PPO | Admitting: Radiology

## 2023-07-07 ENCOUNTER — Encounter: Payer: Self-pay | Admitting: Hematology

## 2023-07-07 ENCOUNTER — Inpatient Hospital Stay: Payer: Self-pay | Attending: Hematology | Admitting: Hematology

## 2023-07-07 VITALS — BP 150/69 | HR 80 | Temp 97.8°F | Resp 18 | Wt 265.3 lb

## 2023-07-07 DIAGNOSIS — C3412 Malignant neoplasm of upper lobe, left bronchus or lung: Secondary | ICD-10-CM | POA: Diagnosis present

## 2023-07-07 DIAGNOSIS — G4700 Insomnia, unspecified: Secondary | ICD-10-CM | POA: Insufficient documentation

## 2023-07-07 DIAGNOSIS — C7931 Secondary malignant neoplasm of brain: Secondary | ICD-10-CM | POA: Insufficient documentation

## 2023-07-07 MED ORDER — ZOLPIDEM TARTRATE 10 MG PO TABS: ORAL_TABLET | ORAL | 0 refills | Status: DC

## 2023-07-07 NOTE — Progress Notes (Signed)
 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 She tolerates moderately well with mucositis and skin toxicities.   -she had thoracic radiation (The primary tumor and involved mediastinal adenopathy were treated to 66 Gy in 33 fractions of 2 Gy), completed on 11/04/2021 - her recent restaging CT scan from May 22, 2022 showed continued response in the primary site and thoracic adenopathy, or new lesions. -She is clinically doing well, lives independently, tolerating Tagrisso  well, will continue. - restaging CT from 03/19/2023 showed post radiation change in lung and no evidence of recurrence. Will continue Tagrisso   -she we will continue follow-up with his radiation oncologist Dr. Patrcia for her brain metastasis, as single fraction SRS was recommended based on her recent brain MRI from March 18, 2023 -restaging CT CAP 06/22/2023 showed stable diease, no progression    Assessment and Plan    Metastatic Non-Small Cell Lung Cancer Follow-up for metastatic non-small cell lung cancer. Reports feeling well with no significant changes. Recent CT scan and brain MRI show stable disease with no new lesions. Experienced nausea and facial flushing after MRI contrast, resolved with Benadryl . Currently on Tagrisso , which appears effective. Discussed potential for future progression on Tagrisso  and need for  ongoing monitoring. Concerned about potential bone metastasis; no current symptoms suggestive of this. Reports joint pain, likely a side effect of Tagrisso , and occasional leg instability. Discussed that bone metastasis typically presents with pain and would be evaluated with PET or bone scan if symptoms arise. - Continue Tagrisso  - Administer Benadryl  before next MRI contrast - Monitor for new symptoms, especially bone pain - Schedule follow-up in two months  Insomnia Requires refill of Ambien . Currently taking half a dose, effective in helping sleep. - Refill Ambien  with a two-month supply (60 tablets)  Recurrent Fungal Skin Infection Recent severe fungal skin infection in skin folds, resolved with nystatin  powder. Using powder regularly after showering to prevent recurrence. - Refill nystatin  powder  General Health Maintenance Received flu shot and COVID-19 vaccine recently. Double masking due to high prevalence of flu. - Continue current vaccination schedule - Maintain double masking in high-risk areas  Plan -I reviewed her recent CT and MRI scan images, overall stable disease -Will continue Tagrisso  80 mg daily - Schedule follow-up appointment in two months.         SUMMARY OF ONCOLOGIC HISTORY: Oncology History Overview Note   Cancer Staging  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) Staging form: Lung, AJCC 8th Edition - Clinical stage from 04/24/2021: Stage IVA (cT2, cN2, pM1b) - Signed by [name], MD on 05/14/2021    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)  04/22/2021 Imaging   CT HEAD WO CONTRAST   IMPRESSION: Two separate areas of vasogenic edema within the left hemisphere, 1 within the inferior frontal region in the other at the left parietal vertex. Metastatic disease would be the most likely cause of this appearance. Other  possibilities include venous infarctions and cerebritis. MRI with contrast recommended.    04/23/2021  Imaging   MR Brain W and Wo Contrast  IMPRESSION: 1. Metastatic disease to the brain. Two enhancing brain masses with moderate associated vasogenic edema: solid 16 mm left parietal lobe mass, and a larger 29 mm rim enhancing cystic or necrotic mass in the left inferior frontal gyrus. Two other small 2-3 mm metastases in the right inferior frontal gyrus, right posterior temporal lobe. And questionable two additional punctate metastases in both superior frontal gyri abutting the falx (series 18, image 46). 2. No significant intracranial mass effect. No other intracranial abnormality.   04/23/2021 Imaging   CT Chest W Contrast  IMPRESSION: 4.5 cm spiculated left upper lobe mass most compatible with primary lung cancer. This abuts the medial pleural surface. A few small adjacent pre-vascular mediastinal lymph nodes, none pathologically enlarged. Recommend cardiothoracic surgical and oncologic consultation.   No acute findings or evidence of metastatic disease in the abdomen or pelvis.   04/24/2021 Cancer Staging   Staging form: Lung, AJCC 8th Edition - Clinical stage from 04/24/2021: Stage IVA (cT2, cN2, pM1b) - Signed by [name], MD on 05/14/2021   04/24/2021 Initial Biopsy   FINAL MICROSCOPIC DIAGNOSIS:   A. LUNG, LUL, FINE NEEDLE ASPIRATION:  - Malignant cells present, consistent with adenocarcinoma   B. LUNG, LUL, BRUSHING:  - Malignant cells consistent with adenocarcinoma, see comment    COMMENT:  B.  Immunohistochemical stains show that the tumor cells are positive for TTF-1 while they are negative for p63 and CK5/6, consistent with above interpretation. The number of tumor cells appears to be insufficient for molecular studies.   05/03/2021 Initial Diagnosis   Lung cancer (HCC)   05/08/2021 - 05/13/2021 Radiation Therapy   SRS treatment to the metastatic brain lesions under the care of Dr. Patrcia    08/05/2021 Imaging   EXAM: CT CHEST, ABDOMEN, AND PELVIS WITH  CONTRAST  IMPRESSION: 1. No substantial change in size of the left upper lobe pulmonary lesion. 2. Borderline to mildly enlarged adjacent prevascular/AP window lymph nodes have increased in size in the interval. This finding raises concern for disease progression. 3. Interval development of upper normal bilateral common iliac and left external iliac lymph nodes. Close attention on follow-up recommended. 4. No other evidence for metastatic disease in the abdomen or pelvis.   09/22/2022 Imaging    IMPRESSION: 1. Area of chronic postradiation mass-like fibrosis in the posteromedial aspect of the left upper lung, without definitive findings of locally recurrent disease or definite metastatic disease in the chest, abdomen or pelvis. 2. Mild atherosclerosis. 3. Additional incidental findings, as above.     03/19/2023 Imaging   CT chest,abdomen, and pelvis with contrast IMPRESSION: 1. Unchanged appearance of posterior paramedian left upper lobe with dense post treatment fibrosis and volume loss. 2. No evidence of recurrent or metastatic disease in the chest, abdomen, or pelvis. 3. Mildly coarse contour of the liver, suggestive of cirrhosis. Correlate with biochemical findings. 4. Status post cholecystectomy and hysterectomy.  Aortic Atherosclerosis (ICD10-       Discussed the use of AI scribe software for clinical note transcription with the patient, who gave verbal consent to proceed.  History of Present Illness   The patient, with a history of metastatic non-small cell lung cancer, presents for a routine follow-up. She reports feeling generally well, with no significant changes in her condition. However, she experienced nausea and facial flushing for two days following a recent brain  MRI with contrast, which was a new experience for her as she typically tolerates CT contrast well. She also had a hacking cough for about a week, which resolved with prescribed cough pearls. She did not  have a fever or feel unwell during this time, but did note some days of increased fatigue. She received both a flu shot and a COVID-19 vaccine recently, which made her feel unwell. She also reports intermittent joint pain, particularly in the legs, which she attributes to a side effect of her cancer medication, Tagrisso . She has a history of a torn MCL and meniscus in one knee, and wonders if the other knee is becoming weak. She also reports occasional skin rashes in skin folds, which she manages with nystatin  powder.         All other systems were reviewed with the patient and are negative.  MEDICAL HISTORY:  Past Medical History:  Diagnosis Date   Allergy    Anxiety    Chronic bronchitis (HCC)    Depression    Headache    Hypoglycemia    Lower back pain    since fall ~ 2012 (05/25/2015)   Migraine    05/25/2015 probably once/month   Migraine headache    Pain in left hip    since fall ~ 2012 (05/25/2015)   Pneumonia several times    SURGICAL HISTORY: Past Surgical History:  Procedure Laterality Date   ABDOMINAL HYSTERECTOMY  11/2002   found benign tumors   BRONCHIAL BIOPSY  04/24/2021   Procedure: BRONCHIAL BIOPSIES;  Surgeon: Brenna Adine CROME, DO;  Location: [facility];  Service: Pulmonary;;   BRONCHIAL BRUSHINGS  04/24/2021   Procedure: BRONCHIAL BRUSHINGS;  Surgeon: Brenna Adine CROME, DO;  Location: [facility];  Service: Pulmonary;;   BRONCHIAL NEEDLE ASPIRATION BIOPSY  04/24/2021   Procedure: BRONCHIAL NEEDLE ASPIRATION BIOPSIES;  Surgeon: Brenna Adine CROME, DO;  Location: [facility];  Service: Pulmonary;;   CHEST TUBE INSERTION  04/24/2021   Procedure: CHEST TUBE INSERTION;  Surgeon: Brenna Adine CROME, DO;  Location: [facility];  Service: Pulmonary;;   CHOLECYSTECTOMY N/A 05/25/2015   Procedure: LAPAROSCOPIC CHOLECYSTECTOMY;  Surgeon: Lynda Leos, MD;  Location: [facility];  Service: General;  Laterality: N/A;   LAPAROSCOPIC CHOLECYSTECTOMY  05/25/2015   MENISCUS  REPAIR Right 03/2017   VIDEO BRONCHOSCOPY WITH ENDOBRONCHIAL NAVIGATION N/A 04/24/2021   Procedure: VIDEO BRONCHOSCOPY WITH ENDOBRONCHIAL NAVIGATION;  Surgeon: Brenna Adine CROME, DO;  Location: [facility];  Service: Pulmonary;  Laterality: N/A;   VIDEO BRONCHOSCOPY WITH RADIAL ENDOBRONCHIAL ULTRASOUND  04/24/2021   Procedure: VIDEO BRONCHOSCOPY WITH RADIAL ENDOBRONCHIAL ULTRASOUND;  Surgeon: Brenna Adine CROME, DO;  Location: [facility];  Service: Pulmonary;;    I have reviewed the social history and family history with the patient and they are unchanged from previous note.  ALLERGIES:  is allergic to celebrex [celecoxib] and minocycline.  MEDICATIONS:  Current Outpatient Medications  Medication Sig Dispense Refill   acetaminophen  (TYLENOL ) 500 MG tablet Take 500 mg by mouth every 6 (six) hours as needed for moderate pain or headache.     albuterol  (VENTOLIN  HFA) 108 (90 Base) MCG/ACT inhaler Inhale 2 puffs into the lungs every 4 (four) hours as needed.     ALPRAZolam  (XANAX ) 0.25 MG tablet Take 0.25 mg by mouth every 4 (four) hours as needed for anxiety. for anxiety     aspirin EC 81 MG tablet Take 81 mg by mouth daily. Swallow whole.     citalopram  (CELEXA ) 10 MG tablet Take 10  mg by mouth at bedtime.     Diclofenac Potassium,Migraine, 50 MG PACK Take 1 each by mouth as needed. As needed for migraines     diclofenac sodium (VOLTAREN) 1 % GEL Apply topically as needed. To affected area     diphenhydrAMINE  (BENADRYL ) 25 MG tablet Take 25 mg by mouth See admin instructions. 25 mg at bedtime 25 mg as needed for itching     Docusate Sodium  (STOOL SOFTENER LAXATIVE PO) Take 1 tablet by mouth as needed.     eletriptan (RELPAX) 20 MG tablet Take 20 mg by mouth as needed for headache (at onset of migraine.). May repeat in 2 hours if needed.     fluticasone (FLONASE) 50 MCG/ACT nasal spray Place 2 sprays into both nostrils daily as needed for allergies.     hydrOXYzine  (ATARAX /VISTARIL ) 25 MG tablet  Take 25 mg by mouth See admin instructions. 25 mg at bedtime 25 mg during the day as needed for itching/anxiety     loratadine  (CLARITIN ) 10 MG tablet Take 10 mg by mouth daily.     magic mouthwash w/lidocaine  SOLN Take 5 mLs by mouth every 6 hours as needed for mouth pain. Swish and Swallow (Patient not taking: Reported on 06/22/2023) 250 mL 1   montelukast  (SINGULAIR ) 10 MG tablet Take 10 mg by mouth at bedtime.     nystatin  powder APPLY  POWDER TOPICALLY TO AFFECTED AREA THREE TIMES DAILY 60 g 1   ondansetron  (ZOFRAN ) 4 MG tablet Take 1 tablet (4 mg total) by mouth every 4 (four) hours as needed for nausea or vomiting. 12 tablet 0   ondansetron  (ZOFRAN -ODT) 4 MG disintegrating tablet DISSOLVE 1 TABLET IN MOUTH EVERY 8 HOURS AS NEEDED FOR NAUSEA AND VOMITING 18 tablet 1   osimertinib  mesylate (TAGRISSO ) 80 MG tablet Take 1 tablet (80 mg total) by mouth daily. 30 tablet 3   prochlorperazine  (COMPAZINE ) 10 MG tablet TAKE 1 TABLET BY MOUTH EVERY 6 HOURS AS NEEDED FOR NAUSEA FOR VOMITING 30 tablet 1   solifenacin (VESICARE) 10 MG tablet Take 10 mg by mouth every evening.     zolpidem  (AMBIEN ) 10 MG tablet TAKE 1/2 TO 1 (ONE-HALF TO ONE) TABLET BY MOUTH AT BEDTIME AS NEEDED FOR SLEEP 60 tablet 0   No current facility-administered medications for this visit.    PHYSICAL EXAMINATION: ECOG PERFORMANCE STATUS: 2 - Symptomatic, <50% confined to bed  Vitals:   07/07/23 1258  BP: (!) 150/69  Pulse: 80  Resp: 18  Temp: 97.8 F (36.6 C)  SpO2: 99%   Wt Readings from Last 3 Encounters:  07/07/23 265 lb 4.8 oz (120.3 kg)  05/07/23 261 lb 6.4 oz (118.6 kg)  03/26/23 259 lb 4.8 oz (117.6 kg)     GENERAL:alert, no distress and comfortable SKIN: skin color, texture, turgor are normal, no rashes or significant lesions EYES: normal, Conjunctiva are pink and non-injected, sclera clear NECK: supple, thyroid normal size, non-tender, without nodularity LYMPH:  no palpable lymphadenopathy in the  cervical, axillary  LUNGS: clear to auscultation and percussion with normal breathing effort HEART: regular rate & rhythm and no murmurs and no lower extremity edema ABDOMEN:abdomen soft, non-tender and normal bowel sounds Musculoskeletal:no cyanosis of digits and no clubbing  NEURO: alert & oriented x 3 with fluent speech, no focal motor/sensory deficits   LABORATORY DATA:  I have reviewed the data as listed    Latest Ref Rng & Units 06/22/2023    1:43 PM 05/07/2023   12:54 PM 03/19/2023  12:46 PM  CBC  WBC 4.0 - 10.5 K/uL 4.3  3.8  3.0   Hemoglobin 12.0 - 15.0 g/dL 88.4  87.9  87.6   Hematocrit 36.0 - 46.0 % 34.1  36.0  36.8   Platelets 150 - 400 K/uL 177  186  176         Latest Ref Rng & Units 06/22/2023    1:43 PM 05/07/2023   12:54 PM 03/19/2023   12:46 PM  CMP  Glucose 70 - 99 mg/dL 881  95  88   BUN 6 - 20 mg/dL 21  19  21    Creatinine 0.44 - 1.00 mg/dL 8.81  8.74  8.65   Sodium 135 - 145 mmol/L 137  138  138   Potassium 3.5 - 5.1 mmol/L 4.1  4.5  4.5   Chloride 98 - 111 mmol/L 105  104  106   CO2 22 - 32 mmol/L 25  29  25    Calcium 8.9 - 10.3 mg/dL 9.5  9.8  9.4   Total Protein 6.5 - 8.1 g/dL 7.2  7.4  7.2   Total Bilirubin 0.0 - 1.2 mg/dL 0.4  0.5  0.4   Alkaline Phos 38 - 126 U/L 83  76  76   AST 15 - 41 U/L 17  17  16    ALT 0 - 44 U/L 15  14  11        RADIOGRAPHIC STUDIES: I have personally reviewed the radiological images as listed and agreed with the findings in the report. No results found.    No orders of the defined types were placed in this encounter.  All questions were answered. The patient knows to call the clinic with any problems, questions or concerns. No barriers to learning was detected. The total time spent in the appointment was 30 minutes.     Onita Mattock, MD 07/07/2023

## 2023-07-13 ENCOUNTER — Other Ambulatory Visit: Payer: Self-pay

## 2023-07-13 NOTE — Progress Notes (Signed)
Specialty Pharmacy Refill Coordination Note  Abigail Golden is a 58 y.o. female contacted today regarding refills of specialty medication(s) Osimertinib Mesylate [name])   Patient requested No data recorded  Delivery date: 07/21/23   Verified address: [address] [place], [address]   Medication will be filled on 07/20/23.

## 2023-07-20 ENCOUNTER — Other Ambulatory Visit: Payer: Self-pay

## 2023-07-27 ENCOUNTER — Encounter: Payer: Self-pay | Admitting: Hematology

## 2023-08-10 ENCOUNTER — Other Ambulatory Visit: Payer: Self-pay

## 2023-08-10 NOTE — Progress Notes (Signed)
 Specialty Pharmacy Refill Coordination Note  Abigail Golden is a 58 y.o. female contacted today regarding refills of specialty medication(s) Osimertinib Mesylate [name])   Patient requested Delivery   Delivery date: 08/18/23   Verified address: [address], [place], [address]   Medication will be filled on 08/17/23.

## 2023-08-11 ENCOUNTER — Ambulatory Visit
Admission: RE | Admit: 2023-08-11 | Discharge: 2023-08-11 | Disposition: A | Discharge status: Home / Self Care | Payer: 59 | Source: Ambulatory Visit | Attending: Family Medicine | Admitting: Family Medicine

## 2023-08-11 DIAGNOSIS — Z1231 Encounter for screening mammogram for malignant neoplasm of breast: Secondary | ICD-10-CM

## 2023-09-07 ENCOUNTER — Other Ambulatory Visit: Payer: Self-pay

## 2023-09-07 DIAGNOSIS — C7931 Secondary malignant neoplasm of brain: Secondary | ICD-10-CM

## 2023-09-07 NOTE — Assessment & Plan Note (Signed)
 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 She tolerates moderately well with mucositis and skin toxicities.   -she had thoracic radiation (The primary tumor and involved mediastinal adenopathy were treated to 66 Gy in 33 fractions of 2 Gy), completed on 11/04/2021 - her recent restaging CT scan from May 22, 2022 showed continued response in the primary site and thoracic adenopathy, or new lesions. -She is clinically doing well, lives independently, tolerating Tagrisso well, will continue. - restaging CT from 03/19/2023 showed post radiation change in lung and no evidence of recurrence. Will continue Tagrisso  -she we will continue follow-up with his radiation oncologist Dr. Kathrynn Running for her brain metastasis, as single fraction SRS was recommended based on her recent brain MRI from March 18, 2023 -restaging CT CAP 06/22/2023 showed stable diease, no progression

## 2023-09-08 ENCOUNTER — Inpatient Hospital Stay: Payer: Self-pay | Admitting: Hematology

## 2023-09-08 ENCOUNTER — Other Ambulatory Visit: Payer: Self-pay

## 2023-09-08 ENCOUNTER — Other Ambulatory Visit (HOSPITAL_COMMUNITY): Payer: Self-pay

## 2023-09-08 ENCOUNTER — Inpatient Hospital Stay: Payer: Self-pay | Attending: Hematology

## 2023-09-08 VITALS — BP 118/68 | HR 74 | Temp 97.7°F | Resp 23 | Ht 60.0 in | Wt 266.4 lb

## 2023-09-08 DIAGNOSIS — Z923 Personal history of irradiation: Secondary | ICD-10-CM | POA: Insufficient documentation

## 2023-09-08 DIAGNOSIS — Z79899 Other long term (current) drug therapy: Secondary | ICD-10-CM | POA: Diagnosis not present

## 2023-09-08 DIAGNOSIS — C3412 Malignant neoplasm of upper lobe, left bronchus or lung: Secondary | ICD-10-CM

## 2023-09-08 DIAGNOSIS — Z7982 Long term (current) use of aspirin: Secondary | ICD-10-CM | POA: Diagnosis not present

## 2023-09-08 DIAGNOSIS — C7931 Secondary malignant neoplasm of brain: Secondary | ICD-10-CM | POA: Diagnosis present

## 2023-09-08 LAB — CMP (CANCER CENTER ONLY)
ALT: 16 U/L (ref 0–44)
AST: 19 U/L (ref 15–41)
Albumin: 4.2 g/dL (ref 3.5–5.0)
Alkaline Phosphatase: 81 U/L (ref 38–126)
Anion gap: 5 (ref 5–15)
BUN: 20 mg/dL (ref 6–20)
CO2: 28 mmol/L (ref 22–32)
Calcium: 9.3 mg/dL (ref 8.9–10.3)
Chloride: 104 mmol/L (ref 98–111)
Creatinine: 1.39 mg/dL — ABNORMAL HIGH (ref 0.44–1.00)
GFR, Estimated: 45 mL/min — ABNORMAL LOW (ref >60)
Glucose, Bld: 101 mg/dL — ABNORMAL HIGH (ref 70–99)
Potassium: 4.8 mmol/L (ref 3.5–5.1)
Sodium: 137 mmol/L (ref 135–145)
Total Bilirubin: 0.4 mg/dL (ref 0.0–1.2)
Total Protein: 7.3 g/dL (ref 6.5–8.1)

## 2023-09-08 LAB — CBC WITH DIFFERENTIAL (CANCER CENTER ONLY)
Abs Immature Granulocytes: 0.01 10*3/uL (ref 0.00–0.07)
Basophils Absolute: 0 10*3/uL (ref 0.0–0.1)
Basophils Relative: 0 %
Eosinophils Absolute: 0 10*3/uL (ref 0.0–0.5)
Eosinophils Relative: 0 %
HCT: 35.7 % — ABNORMAL LOW (ref 36.0–46.0)
Hemoglobin: 11.9 g/dL — ABNORMAL LOW (ref 12.0–15.0)
Immature Granulocytes: 0 %
Lymphocytes Relative: 24 %
Lymphs Abs: 1.1 10*3/uL (ref 0.7–4.0)
MCH: 28.5 pg (ref 26.0–34.0)
MCHC: 33.3 g/dL (ref 30.0–36.0)
MCV: 85.4 fL (ref 80.0–100.0)
Monocytes Absolute: 0.6 10*3/uL (ref 0.1–1.0)
Monocytes Relative: 13 %
Neutro Abs: 2.8 10*3/uL (ref 1.7–7.7)
Neutrophils Relative %: 63 %
Platelet Count: 175 10*3/uL (ref 150–400)
RBC: 4.18 MIL/uL (ref 3.87–5.11)
RDW: 13.1 % (ref 11.5–15.5)
WBC Count: 4.5 10*3/uL (ref 4.0–10.5)
nRBC: 0 % (ref 0.0–0.2)

## 2023-09-08 MED ORDER — OSIMERTINIB MESYLATE 80 MG PO TABS
80.0000 mg | ORAL_TABLET | Freq: Every day | ORAL | 3 refills | Status: DC
  Filled 2023-09-08: qty 30, 30d supply, fill #0
  Filled 2023-10-08: qty 30, 30d supply, fill #1
  Filled 2023-11-11: qty 30, 30d supply, fill #2

## 2023-09-08 MED ORDER — ONDANSETRON 4 MG PO TBDP: ORAL_TABLET | ORAL | 1 refills | Status: DC

## 2023-09-08 NOTE — Progress Notes (Signed)
 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 She tolerates moderately well with mucositis and skin toxicities.   -she had thoracic radiation (The primary tumor and involved mediastinal adenopathy were treated to 66 Gy in 33 fractions of 2 Gy), completed on 11/04/2021 - her recent restaging CT scan from May 22, 2022 showed continued response in the primary site and thoracic adenopathy, or new lesions. -She is clinically doing well, lives independently, tolerating Tagrisso well, will continue. - restaging CT from 03/19/2023 showed post radiation change in lung and no evidence of recurrence. Will continue Tagrisso  -she we will continue follow-up with his radiation oncologist Dr. Kathrynn Running for her brain metastasis, as single fraction SRS was recommended based on her recent brain MRI from March 18, 2023 -restaging CT CAP 06/22/2023 showed stable diease, no progression    Assessment and Plan    Metastatic non-small cell lung cancer Experiencing fatigue, joint pain, and muscle pain, likely side effects of Tagrisso (osimertinib). Joint pain and fatigue have worsened. Managing pain with hydrocodone and Ambien for sleep. High-dose Tylenol (650 mg) discussed as an alternative to hydrocodone due to its addictive potential. Due for a brain scan on April 1st and a PET scan in late April to monitor cancer progression.  Unlikely for cancer to spread to smaller bones like those in the forearm or below the knee. - Order brain scan on April 1st - Schedule PET scan in late April - Advise use of high-dose Tylenol (650 mg) for joint pain - Advise against regular use of hydrocodone - Encourage protein intake and consider protein supplements - Encourage staying active  Shortness of breath Experiences intermittent shortness of breath when walking, possibly related to lung cancer or its treatment.  Medication management Managing pain with hydrocodone and Ambien for sleep. Refill needed for Zofran ODT. Advised to use Tylenol and ibuprofen for pain management instead of hydrocodone. Tramadol or Tylenol #3 discussed as less addictive alternatives if needed. - Refill Zofran ODT at [facility] - Advise use of Tylenol and ibuprofen for pain management - Consider Tramadol or Tylenol #3 if additional pain management is needed  Insurance and follow-up Transitioning from [place] to [facility] on April 1st and will also have Medicare. Needs to update insurance information with the clinic. Follow-up scheduled for the end of April after the PET scan. - Update insurance information with the clinic on April 1st - Schedule follow-up appointment for the end of April after PET scan      Plan -She is clinically stable, will continue Tagrisso -Follow-up in 6 weeks with lab and CT scan 1 week before   SUMMARY OF ONCOLOGIC HISTORY: Oncology History Overview Note   Cancer Staging  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) Staging form: Lung, AJCC 8th Edition - Clinical stage from 04/24/2021: Stage IVA (cT2, cN2, pM1b) - Signed by Malachy Mood, MD on 05/14/2021    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)  04/22/2021 Imaging   CT HEAD WO CONTRAST   IMPRESSION: Two separate areas of vasogenic edema within the left hemisphere, 1 within the inferior frontal region in the other at the left parietal  vertex. Metastatic disease would be the most likely cause of this appearance. Other possibilities include venous infarctions and cerebritis. MRI with contrast recommended.    04/23/2021 Imaging   MR Brain W and Wo Contrast  IMPRESSION: 1. Metastatic disease to the brain. Two enhancing brain masses with moderate associated vasogenic edema: solid 16 mm left parietal lobe mass, and a larger 29 mm rim enhancing cystic or necrotic mass in the left inferior frontal gyrus. Two other small 2-3 mm metastases in the right inferior frontal gyrus, right posterior temporal lobe. And questionable two additional punctate metastases in both superior frontal gyri abutting the falx (series 18, image 46). 2. No significant intracranial mass effect. No other intracranial abnormality.   04/23/2021 Imaging   CT Chest W Contrast  IMPRESSION: 4.5 cm spiculated left upper lobe mass most compatible with primary lung cancer. This abuts the medial pleural surface. A few small adjacent pre-vascular mediastinal lymph nodes, none pathologically enlarged. Recommend cardiothoracic surgical and oncologic consultation.   No acute findings or evidence of metastatic disease in the abdomen or pelvis.   04/24/2021 Cancer Staging   Staging form: Lung, AJCC 8th Edition - Clinical stage from 04/24/2021: Stage IVA (cT2, cN2, pM1b) - Signed by Malachy Mood, MD on 05/14/2021   04/24/2021 Initial Biopsy   FINAL MICROSCOPIC DIAGNOSIS:   A. LUNG, LUL, FINE NEEDLE ASPIRATION:  - Malignant cells present, consistent with adenocarcinoma   B. LUNG, LUL, BRUSHING:  - Malignant cells consistent with adenocarcinoma, see comment    COMMENT:  B.  Immunohistochemical stains show that the tumor cells are positive for TTF-1 while they are negative for p63 and CK5/6, consistent with above interpretation. The number of tumor cells appears to be insufficient for molecular studies.   05/03/2021 Initial Diagnosis   Lung cancer (HCC)   05/08/2021 -  05/13/2021 Radiation Therapy   SRS treatment to the metastatic brain lesions under the care of Dr. Kathrynn Running    08/05/2021 Imaging   EXAM: CT CHEST, ABDOMEN, AND PELVIS WITH CONTRAST  IMPRESSION: 1. No substantial change in size of the left upper lobe pulmonary lesion. 2. Borderline to mildly enlarged adjacent prevascular/AP window lymph nodes have increased in size in the interval. This finding raises concern for disease progression. 3. Interval development of upper normal bilateral common iliac and left external iliac lymph nodes. Close attention on follow-up recommended. 4. No other evidence for metastatic disease in the abdomen or pelvis.   09/22/2022 Imaging    IMPRESSION: 1. Area of chronic postradiation mass-like fibrosis in the posteromedial aspect of the left upper lung, without definitive findings of locally recurrent disease or definite metastatic disease in the chest, abdomen or pelvis. 2. Mild atherosclerosis. 3. Additional incidental findings, as above.     03/19/2023 Imaging   CT chest,abdomen, and pelvis with contrast IMPRESSION: 1. Unchanged appearance of posterior paramedian left upper lobe with dense post treatment fibrosis and volume loss. 2. No evidence of recurrent or metastatic disease in the chest, abdomen, or pelvis. 3. Mildly coarse contour of the liver, suggestive of cirrhosis. Correlate with biochemical findings. 4. Status post cholecystectomy and hysterectomy.  Aortic Atherosclerosis (ICD10-       Discussed the use of AI scribe software for clinical note transcription with the patient, who gave verbal consent to proceed.  History  of Present Illness   The patient, a 58 year old female with metastatic non-small cell lung cancer, presents with increased fatigue and joint pain. The fatigue has been persistent, affecting her daily activities, and she describes her pace as slower than normal. The joint pain, particularly in her legs, has worsened,  preventing her from standing for extended periods. She manages her symptoms by pacing her activities and resting when needed. She has been taking hydrocodone, half a pill as needed, particularly at night to manage the pain and help her sleep. She also takes Ambien for sleep. She reports occasional leg twitching and jerking during sleep.  The patient also experiences shortness of breath, which she describes as coming and going, particularly noticeable when walking. Her appetite is good, with lunch being her main meal. She expresses concern about her nutritional intake, particularly protein, and mentions that she used to take protein supplements during radiation treatments.  The patient also mentions an upset stomach, which she attributes to something she ate. She took Pepto-Bismol to manage the discomfort. She also expresses concern about her cancer potentially spreading to her bones, although she acknowledges that this is more worry than based on any specific symptoms.         All other systems were reviewed with the patient and are negative.  MEDICAL HISTORY:  Past Medical History:  Diagnosis Date   Allergy    Anxiety    Chronic bronchitis (HCC)    Depression    Headache    Hypoglycemia    Lower back pain    since fall ~ 2012 (05/25/2015)   Migraine    05/25/2015 "probably once/month"   Migraine headache    Pain in left hip    since fall ~ 2012 (05/25/2015)   Pneumonia "several times"    SURGICAL HISTORY: Past Surgical History:  Procedure Laterality Date   ABDOMINAL HYSTERECTOMY  11/2002   "found benign tumors"   BRONCHIAL BIOPSY  04/24/2021   Procedure: BRONCHIAL BIOPSIES;  Surgeon: Josephine Igo, DO;  Location: [facility];  Service: Pulmonary;;   BRONCHIAL BRUSHINGS  04/24/2021   Procedure: BRONCHIAL BRUSHINGS;  Surgeon: Josephine Igo, DO;  Location: [facility];  Service: Pulmonary;;   BRONCHIAL NEEDLE ASPIRATION BIOPSY  04/24/2021   Procedure: BRONCHIAL NEEDLE  ASPIRATION BIOPSIES;  Surgeon: Josephine Igo, DO;  Location: [facility];  Service: Pulmonary;;   CHEST TUBE INSERTION  04/24/2021   Procedure: CHEST TUBE INSERTION;  Surgeon: Josephine Igo, DO;  Location: [facility];  Service: Pulmonary;;   CHOLECYSTECTOMY N/A 05/25/2015   Procedure: LAPAROSCOPIC CHOLECYSTECTOMY;  Surgeon: Axel Filler, MD;  Location: [facility] OR;  Service: General;  Laterality: N/A;   LAPAROSCOPIC CHOLECYSTECTOMY  05/25/2015   MENISCUS REPAIR Right 03/2017   VIDEO BRONCHOSCOPY WITH ENDOBRONCHIAL NAVIGATION N/A 04/24/2021   Procedure: VIDEO BRONCHOSCOPY WITH ENDOBRONCHIAL NAVIGATION;  Surgeon: Josephine Igo, DO;  Location: [facility];  Service: Pulmonary;  Laterality: N/A;   VIDEO BRONCHOSCOPY WITH RADIAL ENDOBRONCHIAL ULTRASOUND  04/24/2021   Procedure: VIDEO BRONCHOSCOPY WITH RADIAL ENDOBRONCHIAL ULTRASOUND;  Surgeon: Josephine Igo, DO;  Location: [facility];  Service: Pulmonary;;    I have reviewed the social history and family history with the patient and they are unchanged from previous note.  ALLERGIES:  is allergic to celebrex [celecoxib] and minocycline.  MEDICATIONS:  Current Outpatient Medications  Medication Sig Dispense Refill   acetaminophen (TYLENOL) 500 MG tablet Take 500 mg by mouth every 6 (six) hours as needed for moderate pain or headache.  albuterol (VENTOLIN HFA) 108 (90 Base) MCG/ACT inhaler Inhale 2 puffs into the lungs every 4 (four) hours as needed.     ALPRAZolam (XANAX) 0.25 MG tablet Take 0.25 mg by mouth every 4 (four) hours as needed for anxiety. for anxiety     aspirin EC 81 MG tablet Take 81 mg by mouth daily. Swallow whole.     citalopram (CELEXA) 10 MG tablet Take 10 mg by mouth at bedtime.     Diclofenac Potassium,Migraine, 50 MG PACK Take 1 each by mouth as needed. As needed for migraines     diclofenac sodium (VOLTAREN) 1 % GEL Apply topically as needed. To affected area     diphenhydrAMINE (BENADRYL) 25 MG tablet Take 25  mg by mouth See admin instructions. 25 mg at bedtime 25 mg as needed for itching     Docusate Sodium (STOOL SOFTENER LAXATIVE PO) Take 1 tablet by mouth as needed.     eletriptan (RELPAX) 20 MG tablet Take 20 mg by mouth as needed for headache (at onset of migraine.). May repeat in 2 hours if needed.     fluticasone (FLONASE) 50 MCG/ACT nasal spray Place 2 sprays into both nostrils daily as needed for allergies.     HYDROcodone-acetaminophen (NORCO/VICODIN) 5-325 MG tablet Take 1 tablet by mouth every 6 (six) hours as needed for moderate pain (pain score 4-6).     hydrOXYzine (ATARAX/VISTARIL) 25 MG tablet Take 25 mg by mouth See admin instructions. 25 mg at bedtime 25 mg during the day as needed for itching/anxiety     loratadine (CLARITIN) 10 MG tablet Take 10 mg by mouth daily.     magic mouthwash w/lidocaine SOLN Take 5 mLs by mouth every 6 hours as needed for mouth pain. Swish and Swallow 250 mL 1   montelukast (SINGULAIR) 10 MG tablet Take 10 mg by mouth at bedtime.     nystatin powder APPLY  POWDER TOPICALLY TO AFFECTED AREA THREE TIMES DAILY 60 g 1   ondansetron (ZOFRAN) 4 MG tablet Take 1 tablet (4 mg total) by mouth every 4 (four) hours as needed for nausea or vomiting. 12 tablet 0   prochlorperazine (COMPAZINE) 10 MG tablet TAKE 1 TABLET BY MOUTH EVERY 6 HOURS AS NEEDED FOR NAUSEA FOR VOMITING 30 tablet 1   solifenacin (VESICARE) 10 MG tablet Take 10 mg by mouth every evening.     zolpidem (AMBIEN) 10 MG tablet TAKE 1/2 TO 1 (ONE-HALF TO ONE) TABLET BY MOUTH AT BEDTIME AS NEEDED FOR SLEEP 60 tablet 0   ondansetron (ZOFRAN-ODT) 4 MG disintegrating tablet DISSOLVE 1 TABLET IN MOUTH EVERY 8 HOURS AS NEEDED FOR NAUSEA AND VOMITING 18 tablet 1   osimertinib mesylate (TAGRISSO) 80 MG tablet Take 1 tablet (80 mg total) by mouth daily. 30 tablet 3   No current facility-administered medications for this visit.    PHYSICAL EXAMINATION: ECOG PERFORMANCE STATUS: 2 - Symptomatic, <50% confined  to bed  Vitals:   09/08/23 1353  BP: 118/68  Pulse: 74  Resp: (!) 23  Temp: 97.7 F (36.5 C)  SpO2: 95%   Wt Readings from Last 3 Encounters:  09/08/23 266 lb 6.4 oz (120.8 kg)  07/07/23 265 lb 4.8 oz (120.3 kg)  05/07/23 261 lb 6.4 oz (118.6 kg)     GENERAL:alert, no distress and comfortable SKIN: skin color, texture, turgor are normal, no rashes or significant lesions EYES: normal, Conjunctiva are pink and non-injected, sclera clear NECK: supple, thyroid normal size, non-tender, without nodularity LYMPH:  no palpable lymphadenopathy in the cervical, axillary  LUNGS: clear to auscultation and percussion with normal breathing effort HEART: regular rate & rhythm and no murmurs and no lower extremity edema ABDOMEN:abdomen soft, non-tender and normal bowel sounds Musculoskeletal:no cyanosis of digits and no clubbing  NEURO: alert & oriented x 3 with fluent speech, no focal motor/sensory deficits    LABORATORY DATA:  I have reviewed the data as listed    Latest Ref Rng & Units 09/08/2023    1:19 PM 06/22/2023    1:43 PM 05/07/2023   12:54 PM  CBC  WBC 4.0 - 10.5 K/uL 4.5  4.3  3.8   Hemoglobin 12.0 - 15.0 g/dL 16.1  09.6  04.5   Hematocrit 36.0 - 46.0 % 35.7  34.1  36.0   Platelets 150 - 400 K/uL 175  177  186         Latest Ref Rng & Units 09/08/2023    1:19 PM 06/22/2023    1:43 PM 05/07/2023   12:54 PM  CMP  Glucose 70 - 99 mg/dL 409  811  95   BUN 6 - 20 mg/dL 20  21  19    Creatinine 0.44 - 1.00 mg/dL 9.14  7.82  9.56   Sodium 135 - 145 mmol/L 137  137  138   Potassium 3.5 - 5.1 mmol/L 4.8  4.1  4.5   Chloride 98 - 111 mmol/L 104  105  104   CO2 22 - 32 mmol/L 28  25  29    Calcium 8.9 - 10.3 mg/dL 9.3  9.5  9.8   Total Protein 6.5 - 8.1 g/dL 7.3  7.2  7.4   Total Bilirubin 0.0 - 1.2 mg/dL 0.4  0.4  0.5   Alkaline Phos 38 - 126 U/L 81  83  76   AST 15 - 41 U/L 19  17  17    ALT 0 - 44 U/L 16  15  14        RADIOGRAPHIC STUDIES: I have personally  reviewed the radiological images as listed and agreed with the findings in the report. No results found.    Orders Placed This Encounter  Procedures   NM PET Image Restag (PS) Skull Base To Thigh    Standing Status:   Future    Expected Date:   10/13/2023    Expiration Date:   09/07/2024    If indicated for the ordered procedure, I authorize the administration of a radiopharmaceutical per Radiology protocol:   Yes    Is the patient pregnant?:   No    Preferred imaging location?:   [facility]   All questions were answered. The patient knows to call the clinic with any problems, questions or concerns. No barriers to learning was detected. The total time spent in the appointment was 25 minutes.     Malachy Mood, MD 09/08/2023

## 2023-09-08 NOTE — Progress Notes (Signed)
 Specialty Pharmacy Refill Coordination Note  Abigail Golden is a 58 y.o. female contacted today regarding refills of specialty medication(s) Osimertinib Mesylate [name])   Patient requested Daryll Drown at [facility] at [employer] date: 09/21/23   Medication will be filled on 09/18/23.

## 2023-09-10 ENCOUNTER — Encounter: Payer: Self-pay | Admitting: Nurse Practitioner

## 2023-09-18 ENCOUNTER — Other Ambulatory Visit: Payer: Self-pay

## 2023-09-22 ENCOUNTER — Ambulatory Visit (HOSPITAL_COMMUNITY)
Admission: RE | Admit: 2023-09-22 | Discharge: 2023-09-22 | Disposition: A | Discharge status: Home / Self Care | Payer: Self-pay | Source: Ambulatory Visit | Attending: Radiation Oncology | Admitting: Radiation Oncology

## 2023-09-22 ENCOUNTER — Encounter: Payer: Self-pay | Admitting: Hematology

## 2023-09-22 DIAGNOSIS — C7931 Secondary malignant neoplasm of brain: Secondary | ICD-10-CM | POA: Insufficient documentation

## 2023-09-22 MED ORDER — GADOBUTROL 1 MMOL/ML IV SOLN
10.0000 mL | Freq: Once | INTRAVENOUS | Status: AC | PRN
  Administered 2023-09-22: 10 mL via INTRAVENOUS

## 2023-09-24 ENCOUNTER — Other Ambulatory Visit: Payer: Self-pay | Admitting: Radiology

## 2023-09-25 ENCOUNTER — Ambulatory Visit
Admission: RE | Admit: 2023-09-25 | Discharge: 2023-09-25 | Disposition: A | Discharge status: Home / Self Care | Payer: Self-pay | Source: Ambulatory Visit | Attending: Urology | Admitting: Urology

## 2023-09-25 ENCOUNTER — Encounter: Payer: Self-pay | Admitting: Urology

## 2023-09-25 DIAGNOSIS — C7931 Secondary malignant neoplasm of brain: Secondary | ICD-10-CM

## 2023-09-25 NOTE — Progress Notes (Addendum)
 Telephone nursing appointment for review of most recent MRI-Brain results. I verified patient's identity x2 and began nursing interview.   Patient reports:    Headaches- Yes- 7/10, increasing in frequency within the last month.   -Fatigue- Yes- (moderate)  -Hair loss- Yes- mild, during prior radiation but is returning.  -Dexamethasone- No  -Weakness or loss of control of the extremities- No, but is unbalanced at times.   -Seizure or uncontrolled movement- No   -Changes in vision- Yes- Mild decline since completing last course of radiation- First Treatment Date: 2023-03-30 - Last Treatment Date: 2023-03-30   -Changes in speech- No  -Confusion- No   Patient denies any other related issues at this time.  Meaningful use complete.  Patient aware of their telephone appointment w/ Ashlyn Bruning PA-C. I left my extension [phone] in case patient needs anything. Patient verbalized understanding. This concludes the nursing interview.   Patient preferred phone # [phone]    Ruel Favors, LPN

## 2023-09-25 NOTE — Progress Notes (Signed)
 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 Malignant neoplasm metastatic to brain [facility])  C79.31        Interval Since Last Radiation:  6 months   First Treatment Date: 2023-03-30 - Last Treatment Date: 2023-03-30   Plan Name: Brain_SRS Site: Brain Technique: SBRT/SRT-IMRT Mode: Photon Dose Per Fraction: 20 Gy Prescribed Dose (Delivered / Prescribed): 20 Gy / 20 Gy Prescribed Fxs (Delivered / Prescribed): 1 / 1  12/10/21: Single fraction SRS brain PTV9 left frontal lobe    09/19/21 - 11/04/21: The primary tumor in the LUL lung and involved mediastinal adenopathy were treated to 66 Gy in 33 fractions of 2 Gy.   08/27/21: Single fraction SRS Brain PTV7-8 in left frontal lobe    05/08/21 - 05/13/21: Fractionated SRS Brain PTV1 - PTV6      Narrative:  I spoke with the patient to conduct her routine scheduled 3 month follow up visit to review results of her recent MRI brain scan via telephone to spare the patient unnecessary potential exposure in the healthcare setting during the current COVID-19 pandemic.  The patient was notified in advance and gave permission to proceed with this visit format.   She has recovered well from the effects of her SRS treatments. Her most recent post-treatment MRI brain scan from 09/22/23 demonstrated increased size of an anterior left frontal lobe lesion and parasagittal right frontal lobe lesion with increased surrounding edema, felt most likely to be treatment effect.  There was also mention of a new punctate focus of enhancement in the high left parietal lobe concerning  for a new metastatic lesion but upon fusing these images with her prior treatment plans, it is confirmed that this lesion has been previously treated.  Therefore, there are no new lesions noted and this study is felt to be overall, stable.  We reviewed these results today.   On review of systems, the patient states that she is doing well overall. She reports having some occasional pain and tingling at the back of her head and into her neck that is transient over the past 3 to 4 days, but has not experienced any significant headaches, lightheadedness or changes in her visual acuity. She denies any nausea, vision or auditory disturbances, issues with balance or coordination, difficulties with walking, or numbness/tingling in her extremities.    ALLERGIES:  is allergic to celebrex [celecoxib] and minocycline.  Meds: Current Outpatient Medications  Medication Sig Dispense Refill   acetaminophen (TYLENOL) 500 MG tablet Take 500 mg by mouth every 6 (six) hours as needed for moderate pain or headache.     albuterol (VENTOLIN HFA) 108 (90 Base) MCG/ACT inhaler Inhale 2 puffs into the lungs every 4 (four) hours as needed.     ALPRAZolam (XANAX) 0.25 MG tablet Take 0.25 mg by mouth every 4 (four) hours as needed for anxiety. for anxiety     aspirin EC 81 MG tablet Take 81 mg by mouth daily. Swallow whole.     citalopram (CELEXA) 10 MG tablet Take 10 mg by mouth at bedtime.     Diclofenac Potassium,Migraine,  50 MG PACK Take 1 each by mouth as needed. As needed for migraines     diclofenac sodium (VOLTAREN) 1 % GEL Apply topically as needed. To affected area     diphenhydrAMINE (BENADRYL) 25 MG tablet Take 25 mg by mouth See admin instructions. 25 mg at bedtime 25 mg as needed for itching     Docusate Sodium (STOOL SOFTENER LAXATIVE PO) Take 1 tablet by mouth as needed.     eletriptan (RELPAX) 20 MG tablet Take 20 mg by mouth as needed for headache (at onset of migraine.). May repeat in 2 hours if needed.      fluticasone (FLONASE) 50 MCG/ACT nasal spray Place 2 sprays into both nostrils daily as needed for allergies.     HYDROcodone-acetaminophen (NORCO/VICODIN) 5-325 MG tablet Take 1 tablet by mouth every 6 (six) hours as needed for moderate pain (pain score 4-6).     hydrOXYzine (ATARAX/VISTARIL) 25 MG tablet Take 25 mg by mouth See admin instructions. 25 mg at bedtime 25 mg during the day as needed for itching/anxiety     loratadine (CLARITIN) 10 MG tablet Take 10 mg by mouth daily.     magic mouthwash w/lidocaine SOLN Take 5 mLs by mouth every 6 hours as needed for mouth pain. Swish and Swallow (Patient not taking: Reported on 09/25/2023) 250 mL 1   montelukast (SINGULAIR) 10 MG tablet Take 10 mg by mouth at bedtime.     nystatin powder APPLY  POWDER TOPICALLY TO AFFECTED AREA THREE TIMES DAILY 60 g 1   ondansetron (ZOFRAN-ODT) 4 MG disintegrating tablet DISSOLVE 1 TABLET IN MOUTH EVERY 8 HOURS AS NEEDED FOR NAUSEA AND VOMITING 18 tablet 1   osimertinib mesylate (TAGRISSO) 80 MG tablet Take 1 tablet (80 mg total) by mouth daily. 30 tablet 3   prochlorperazine (COMPAZINE) 10 MG tablet TAKE 1 TABLET BY MOUTH EVERY 6 HOURS AS NEEDED FOR NAUSEA FOR VOMITING 30 tablet 1   solifenacin (VESICARE) 10 MG tablet Take 10 mg by mouth every evening.     zolpidem (AMBIEN) 10 MG tablet TAKE 1/2 TO 1 (ONE-HALF TO ONE) TABLET BY MOUTH AT BEDTIME AS NEEDED FOR SLEEP 60 tablet 0   No current facility-administered medications for this encounter.    Physical Findings: Today's Vitals   09/25/23 1040  PainSc: 7   PainLoc: Head   There is no height or weight on file to calculate BMI. Unable to assess due to telephone follow-up visit format.    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 We personally reviewed her MRI brain scan from 09/22/23 which demonstrated increased size of an anterior left frontal lobe lesion and parasagittal right frontal lobe lesion with increased surrounding edema, felt most likely to be treatment effect.  There was also mention of a new punctate focus of enhancement in the high left parietal lobe concerning for a new metastatic lesion but upon fusing these images with her prior treatment plans, it is confirmed that this lesion has been previously treated.  Therefore, there are no new  lesions noted and this study is felt to be overall, stable. We discussed the recommendation to continue to monitor closely with serial brain MRIs every 3 months and I will call her to review the results and any recommendations following her each scan. She was educated on signs concerning for disease progression and understands to inform our staff if she experiences any of these in the interim.   She continues to follow up with Dr. Mosetta Putt for management of her systemic disease. She is currently on Tagrisso therapy and her most recent restaging CT from 06/22/2023 showed no evidence of disease progression or recurrence.  She is scheduled for a PET scan on 10/16/2023 for disease restaging and has a follow-up visit with Dr. Mosetta Putt on 10/22/2023 to review those results.  We appreciate the opportunity to participate in this patient's care. She knows that she is welcome to call with any questions or concerns in the interim.   I personally spent 30 minutes in this encounter including chart review, reviewing radiological studies, telephone conversation with the patient, entering orders and completing documentation.   Marguarite Arbour, MMS, PA-C [place]  [facility] Radiation  Oncology Physician Assistant Direct Dial: [phone]  Fax: [phone]

## 2023-09-28 ENCOUNTER — Other Ambulatory Visit: Payer: Self-pay

## 2023-09-28 ENCOUNTER — Inpatient Hospital Stay: Payer: Self-pay | Attending: Hematology

## 2023-09-28 ENCOUNTER — Inpatient Hospital Stay: Payer: Self-pay

## 2023-09-28 DIAGNOSIS — C7931 Secondary malignant neoplasm of brain: Secondary | ICD-10-CM

## 2023-09-29 ENCOUNTER — Other Ambulatory Visit: Payer: Self-pay | Admitting: Radiation Therapy

## 2023-09-30 ENCOUNTER — Ambulatory Visit: Payer: Self-pay | Admitting: Urology

## 2023-10-08 ENCOUNTER — Encounter: Payer: Self-pay | Admitting: Hematology

## 2023-10-08 ENCOUNTER — Other Ambulatory Visit: Payer: Self-pay

## 2023-10-08 NOTE — Progress Notes (Signed)
 Specialty Pharmacy Refill Coordination Note  Abigail Golden is a 58 y.o. female contacted today regarding refills of specialty medication(s) Osimertinib Mesylate (TAGRISSO)   Patient requested (Patient-Rptd) Pickup at [facility] at [facility] date: (Patient-Rptd) 10/16/23   Medication will be filled on 04.24.25.

## 2023-10-11 ENCOUNTER — Observation Stay (HOSPITAL_COMMUNITY)
Admission: EM | Admit: 2023-10-11 | Discharge: 2023-10-13 | Disposition: A | Discharge status: Home / Self Care | Attending: Family Medicine | Admitting: Family Medicine

## 2023-10-11 ENCOUNTER — Encounter (HOSPITAL_COMMUNITY): Payer: Self-pay

## 2023-10-11 ENCOUNTER — Other Ambulatory Visit: Payer: Self-pay

## 2023-10-11 DIAGNOSIS — N1832 Chronic kidney disease, stage 3b: Secondary | ICD-10-CM | POA: Diagnosis not present

## 2023-10-11 DIAGNOSIS — Z79899 Other long term (current) drug therapy: Secondary | ICD-10-CM | POA: Diagnosis not present

## 2023-10-11 DIAGNOSIS — C3490 Malignant neoplasm of unspecified part of unspecified bronchus or lung: Secondary | ICD-10-CM | POA: Diagnosis not present

## 2023-10-11 DIAGNOSIS — R109 Unspecified abdominal pain: Secondary | ICD-10-CM | POA: Diagnosis present

## 2023-10-11 DIAGNOSIS — Z7901 Long term (current) use of anticoagulants: Secondary | ICD-10-CM | POA: Diagnosis not present

## 2023-10-11 DIAGNOSIS — K56600 Partial intestinal obstruction, unspecified as to cause: Principal | ICD-10-CM | POA: Diagnosis present

## 2023-10-11 DIAGNOSIS — C7931 Secondary malignant neoplasm of brain: Secondary | ICD-10-CM | POA: Diagnosis not present

## 2023-10-11 DIAGNOSIS — R197 Diarrhea, unspecified: Secondary | ICD-10-CM | POA: Diagnosis not present

## 2023-10-11 LAB — CBC
HCT: 39.5 % (ref 36.0–46.0)
Hemoglobin: 12.6 g/dL (ref 12.0–15.0)
MCH: 28.1 pg (ref 26.0–34.0)
MCHC: 31.9 g/dL (ref 30.0–36.0)
MCV: 88 fL (ref 80.0–100.0)
Platelets: 185 10*3/uL (ref 150–400)
RBC: 4.49 MIL/uL (ref 3.87–5.11)
RDW: 13.6 % (ref 11.5–15.5)
WBC: 6.8 10*3/uL (ref 4.0–10.5)
nRBC: 0 % (ref 0.0–0.2)

## 2023-10-11 LAB — COMPREHENSIVE METABOLIC PANEL WITH GFR
ALT: 20 U/L (ref 0–44)
AST: 21 U/L (ref 15–41)
Albumin: 3.9 g/dL (ref 3.5–5.0)
Alkaline Phosphatase: 63 U/L (ref 38–126)
Anion gap: 9 (ref 5–15)
BUN: 19 mg/dL (ref 6–20)
CO2: 24 mmol/L (ref 22–32)
Calcium: 9.6 mg/dL (ref 8.9–10.3)
Chloride: 104 mmol/L (ref 98–111)
Creatinine, Ser: 1.33 mg/dL — ABNORMAL HIGH (ref 0.44–1.00)
GFR, Estimated: 47 mL/min — ABNORMAL LOW (ref >60)
Glucose, Bld: 119 mg/dL — ABNORMAL HIGH (ref 70–99)
Potassium: 4.4 mmol/L (ref 3.5–5.1)
Sodium: 137 mmol/L (ref 135–145)
Total Bilirubin: 0.9 mg/dL (ref 0.0–1.2)
Total Protein: 7.3 g/dL (ref 6.5–8.1)

## 2023-10-11 LAB — LIPASE, BLOOD: Lipase: 35 U/L (ref 11–51)

## 2023-10-11 LAB — POC OCCULT BLOOD, ED: Fecal Occult Bld: NEGATIVE

## 2023-10-11 MED ORDER — ONDANSETRON HCL 4 MG/2ML IJ SOLN
4.0000 mg | Freq: Once | INTRAMUSCULAR | Status: AC
  Administered 2023-10-11: 4 mg via INTRAVENOUS
  Filled 2023-10-11: qty 2

## 2023-10-11 MED ORDER — SODIUM CHLORIDE 0.9 % IV BOLUS (SEPSIS)
1000.0000 mL | Freq: Once | INTRAVENOUS | Status: AC
  Administered 2023-10-11: 1000 mL via INTRAVENOUS

## 2023-10-11 MED ORDER — FENTANYL CITRATE PF 50 MCG/ML IJ SOSY
25.0000 ug | PREFILLED_SYRINGE | Freq: Once | INTRAMUSCULAR | Status: AC
  Administered 2023-10-11: 25 ug via INTRAVENOUS
  Filled 2023-10-11: qty 1

## 2023-10-11 NOTE — ED Provider Notes (Signed)
 [place] EMERGENCY DEPARTMENT AT [facility] Provider Note   CSN: [id number] Arrival date & time: 10/11/23  1922     History {Add pertinent medical, surgical, social history, OB history to HPI:1} Chief Complaint  Patient presents with   Abdominal Pain    Abigail Golden is a 58 y.o. female.  The history is provided by the patient and a friend.  Patient w/history of lung CA with brain mets on oral chemo presents with lower abdominal pain and diarrhea.  Patient reports that she was in her usual state of health yesterday.  She woke up around 4 AM this morning with lower abdominal pain.  The pain continued throughout the day with nausea, and after arriving in the ER she has had multiple episodes of nonbloody diarrhea.  No fevers.  Previous abdominal surgery includes cholecystectomy and hysterectomy.  No cough or shortness of breath   Patient reports while getting her blood drawn she started feeling faint but this resolved soon after, and she is now back to baseline, denies any falls or injuries She has had this previously She was on Omnicef recently for a sinus infection No recent travel Home Medications Prior to Admission medications   Medication Sig Start Date End Date Taking? Authorizing Provider  acetaminophen  (TYLENOL ) 500 MG tablet Take 500 mg by mouth every 6 (six) hours as needed for moderate pain or headache.    [provider]  albuterol  (VENTOLIN  HFA) 108 (90 Base) MCG/ACT inhaler Inhale 2 puffs into the lungs every 4 (four) hours as needed. 06/21/19   [provider]  ALPRAZolam  (XANAX ) 0.25 MG tablet Take 0.25 mg by mouth every 4 (four) hours as needed for anxiety. for anxiety 07/16/18   [provider]  aspirin EC 81 MG tablet Take 81 mg by mouth daily. Swallow whole.    [provider]  citalopram  (CELEXA ) 10 MG tablet Take 10 mg by mouth at bedtime. 08/16/18   [provider]  Diclofenac Potassium,Migraine, 50 MG PACK  Take 1 each by mouth as needed. As needed for migraines    [provider]  diclofenac sodium (VOLTAREN) 1 % GEL Apply topically as needed. To affected area    [provider]  diphenhydrAMINE  (BENADRYL ) 25 MG tablet Take 25 mg by mouth See admin instructions. 25 mg at bedtime 25 mg as needed for itching    [provider]  Docusate Sodium  (STOOL SOFTENER LAXATIVE PO) Take 1 tablet by mouth as needed.    [provider]  eletriptan (RELPAX) 20 MG tablet Take 20 mg by mouth as needed for headache (at onset of migraine.). May repeat in 2 hours if needed.    [provider]  fluticasone (FLONASE) 50 MCG/ACT nasal spray Place 2 sprays into both nostrils daily as needed for allergies. 02/12/17   [provider]  HYDROcodone -acetaminophen  (NORCO/VICODIN) 5-325 MG tablet Take 1 tablet by mouth every 6 (six) hours as needed for moderate pain (pain score 4-6).    [provider]  hydrOXYzine  (ATARAX /VISTARIL ) 25 MG tablet Take 25 mg by mouth See admin instructions. 25 mg at bedtime 25 mg during the day as needed for itching/anxiety    [provider]  loratadine  (CLARITIN ) 10 MG tablet Take 10 mg by mouth daily.    [provider]  magic mouthwash w/lidocaine  SOLN Take 5 mLs by mouth every 6 hours as needed for mouth pain. Swish and Swallow Patient not taking: Reported on 09/25/2023 06/03/21   Maryalice Smaller,  Gracie Lav, MD  montelukast  (SINGULAIR ) 10 MG tablet Take 10 mg by mouth at bedtime.    [provider]  nystatin  powder APPLY  POWDER TOPICALLY TO AFFECTED AREA THREE TIMES DAILY 11/28/22   Sonja LaPlace, MD  ondansetron  (ZOFRAN -ODT) 4 MG disintegrating tablet DISSOLVE 1 TABLET IN MOUTH EVERY 8 HOURS AS NEEDED FOR NAUSEA AND VOMITING 09/08/23   Sonja [place], MD  osimertinib  mesylate (TAGRISSO ) 80 MG tablet Take 1 tablet (80 mg total) by mouth daily. 09/08/23   Sonja Hurricane, MD  prochlorperazine  (COMPAZINE ) 10 MG tablet TAKE 1 TABLET BY MOUTH EVERY  6 HOURS AS NEEDED FOR NAUSEA FOR VOMITING 01/14/22   Sonja Alpine, MD  solifenacin (VESICARE) 10 MG tablet Take 10 mg by mouth every evening.    [provider]  zolpidem  (AMBIEN ) 10 MG tablet TAKE 1/2 TO 1 (ONE-HALF TO ONE) TABLET BY MOUTH AT BEDTIME AS NEEDED FOR SLEEP 07/07/23   Sonja [place], MD      Allergies    Celebrex [celecoxib] and Minocycline    Review of Systems   Review of Systems  Constitutional:  Positive for fatigue. Negative for fever.  Respiratory:  Negative for cough.   Gastrointestinal:  Positive for abdominal pain, diarrhea and nausea. Negative for blood in stool and vomiting.    Physical Exam Updated Vital Signs BP (!) 97/49   Pulse 63   Temp 97.8 F (36.6 C) (Oral)   Resp 12   SpO2 98%  Physical Exam CONSTITUTIONAL: Uncomfortable appearing HEAD: Normocephalic/atraumatic EYES: EOMI/PERRL, no icterus ENMT: Mucous membranes dry NECK: supple no meningeal signs CV: S1/S2 noted, no murmurs/rubs/gallops noted LUNGS: Lungs are clear to auscultation bilaterally, no apparent distress ABDOMEN: soft, moderate RLQ tenderness, no rebound or guarding, bowel sounds noted throughout abdomen NEURO: Pt is awake/alert/appropriate, moves all extremitiesx4.  No facial droop.   EXTREMITIES: pulses normal/equal, full ROM SKIN: warm, color normal  ED Results / Procedures / Treatments   Labs (all labs ordered are listed, but only abnormal results are displayed) Labs Reviewed  COMPREHENSIVE METABOLIC PANEL WITH GFR - Abnormal; Notable for the following components:      Result Value   Glucose, Bld 119 (*)    Creatinine, Ser 1.33 (*)    GFR, Estimated 47 (*)    All other components within normal limits  C DIFFICILE QUICK SCREEN W PCR REFLEX    LIPASE, BLOOD  CBC  POC OCCULT BLOOD, ED    EKG EKG Interpretation Date/Time:  Sunday October 11 2023 23:02:33 EDT Ventricular Rate:  60 PR Interval:  121 QRS Duration:  92 QT Interval:  451 QTC Calculation: 451 R  Axis:   57  Text Interpretation: Sinus rhythm Confirmed by Eldon Greenland ([id number]) on 10/11/2023 11:06:13 PM  Radiology No results found.  Procedures Procedures  {Document cardiac monitor, telemetry assessment procedure when appropriate:1}  Medications Ordered in ED Medications  sodium chloride  0.9 % bolus 1,000 mL (1,000 mLs Intravenous New Bag/Given 10/11/23 2337)  ondansetron  (ZOFRAN ) injection 4 mg (4 mg Intravenous Given 10/11/23 2337)  fentaNYL  (SUBLIMAZE ) injection 25 mcg (25 mcg Intravenous Given 10/11/23 2337)    ED Course/ Medical Decision Making/ A&P Clinical Course as of 10/11/23 2351  Sun Oct 11, 2023  2349 Creatinine(!): 1.33 Mild renal insufficiency [DW]  2349 Patient with abdominal pain over the past day now having abrupt nonbloody diarrhea.  Patient did have a vasovagal episode while getting her blood drawn earlier, but she has had this previously. Patient appears clinically dehydrated.  She has  lower abdominal tenderness.  Will proceed with CT imaging [DW]    Clinical Course User Index [DW] Eldon Greenland, MD   {   Click here for ABCD2, HEART and other calculatorsREFRESH Note before signing :1}                              Medical Decision Making Amount and/or Complexity of Data Reviewed Labs:  Decision-making details documented in ED Course. Radiology: ordered.  Risk Prescription drug management.   This patient presents to the ED for concern of abdominal pain, this involves an extensive number of treatment options, and is a complaint that carries with it a high risk of complications and morbidity.  The differential diagnosis includes but is not limited to  pancreatitis, gastritis, peptic ulcer disease, appendicitis, bowel obstruction, bowel perforation, diverticulitis, AAA, ischemic bowel C. difficile colitis  Comorbidities that complicate the patient evaluation: Patient's presentation is complicated by their history of lung cancer   Additional  history obtained: Additional history obtained from  friend Records reviewed  outpatient records  Lab Tests: I Ordered, and personally interpreted labs.  The pertinent results include: Renal insufficiency  Imaging Studies ordered: I ordered imaging studies including CT scan abdomen pelvis   I independently visualized and interpreted imaging which showed *** I agree with the radiologist interpretation  Cardiac Monitoring: The patient was maintained on a cardiac monitor.  I personally viewed and interpreted the cardiac monitor which showed an underlying rhythm of:  sinus rhythm  Medicines ordered and prescription drug management: I ordered medication including IV fluids for dehydration IV fentanyl  for analgesia Reevaluation of the patient after these medicines showed that the patient    {resolved/improved/worsened:23923::"improved"}  Test Considered: Patient is low risk / negative by ***, therefore do not feel that *** is indicated.  Critical Interventions:  ***  Consultations Obtained: I requested consultation with the {consultation:[id number]}, and discussed  findings as well as pertinent plan - they recommend: ***  Reevaluation: After the interventions noted above, I reevaluated the patient and found that they have :{resolved/improved/worsened:23923::"improved"}  Complexity of problems addressed: Patient's presentation is most consistent with  acute presentation with potential threat to life or bodily function  Disposition: After consideration of the diagnostic results and the patient's response to treatment,  I feel that the patent would benefit from {disposition:26850}.     {Document critical care time when appropriate:1} {Document review of labs and clinical decision tools ie heart score, Chads2Vasc2 etc:1}  {Document your independent review of radiology images, and any outside records:1} {Document your discussion with family members, caretakers, and with  consultants:1} {Document social determinants of health affecting pt's care:1} {Document your decision making why or why not admission, treatments were needed:1} Final Clinical Impression(s) / ED Diagnoses Final diagnoses:  None    Rx / DC Orders ED Discharge Orders     None

## 2023-10-11 NOTE — ED Triage Notes (Signed)
 Pt. Arrives for RLQ abdominal pain. Pt. Endorses nausea. Took zofran  at 12:30. Denies vomiting. Also endorses loose stools.

## 2023-10-12 ENCOUNTER — Observation Stay (HOSPITAL_COMMUNITY)

## 2023-10-12 ENCOUNTER — Emergency Department (HOSPITAL_COMMUNITY)

## 2023-10-12 DIAGNOSIS — K56600 Partial intestinal obstruction, unspecified as to cause: Secondary | ICD-10-CM | POA: Diagnosis not present

## 2023-10-12 LAB — BASIC METABOLIC PANEL WITH GFR
Anion gap: 7 (ref 5–15)
BUN: 17 mg/dL (ref 6–20)
CO2: 21 mmol/L — ABNORMAL LOW (ref 22–32)
Calcium: 8.3 mg/dL — ABNORMAL LOW (ref 8.9–10.3)
Chloride: 105 mmol/L (ref 98–111)
Creatinine, Ser: 1.24 mg/dL — ABNORMAL HIGH (ref 0.44–1.00)
GFR, Estimated: 51 mL/min — ABNORMAL LOW (ref >60)
Glucose, Bld: 108 mg/dL — ABNORMAL HIGH (ref 70–99)
Potassium: 4.4 mmol/L (ref 3.5–5.1)
Sodium: 133 mmol/L — ABNORMAL LOW (ref 135–145)

## 2023-10-12 LAB — HIV ANTIBODY (ROUTINE TESTING W REFLEX): HIV Screen 4th Generation wRfx: NONREACTIVE

## 2023-10-12 LAB — CBC
HCT: 32.4 % — ABNORMAL LOW (ref 36.0–46.0)
Hemoglobin: 10.4 g/dL — ABNORMAL LOW (ref 12.0–15.0)
MCH: 28.3 pg (ref 26.0–34.0)
MCHC: 32.1 g/dL (ref 30.0–36.0)
MCV: 88.3 fL (ref 80.0–100.0)
Platelets: 123 10*3/uL — ABNORMAL LOW (ref 150–400)
RBC: 3.67 MIL/uL — ABNORMAL LOW (ref 3.87–5.11)
RDW: 13.6 % (ref 11.5–15.5)
WBC: 4.1 10*3/uL (ref 4.0–10.5)
nRBC: 0 % (ref 0.0–0.2)

## 2023-10-12 LAB — PHOSPHORUS: Phosphorus: 3.7 mg/dL (ref 2.5–4.6)

## 2023-10-12 LAB — C DIFFICILE QUICK SCREEN W PCR REFLEX
C Diff antigen: NEGATIVE
C Diff interpretation: NOT DETECTED
C Diff toxin: NEGATIVE

## 2023-10-12 LAB — MAGNESIUM: Magnesium: 2.1 mg/dL (ref 1.7–2.4)

## 2023-10-12 MED ORDER — ENOXAPARIN SODIUM 40 MG/0.4ML IJ SOSY
40.0000 mg | PREFILLED_SYRINGE | INTRAMUSCULAR | Status: DC
  Administered 2023-10-12 – 2023-10-13 (×2): 40 mg via SUBCUTANEOUS
  Filled 2023-10-12 (×2): qty 0.4

## 2023-10-12 MED ORDER — ONDANSETRON HCL 4 MG/2ML IJ SOLN
4.0000 mg | Freq: Four times a day (QID) | INTRAMUSCULAR | Status: DC | PRN
  Administered 2023-10-12 (×2): 4 mg via INTRAVENOUS
  Filled 2023-10-12 (×2): qty 2

## 2023-10-12 MED ORDER — IOHEXOL 300 MG/ML  SOLN
100.0000 mL | Freq: Once | INTRAMUSCULAR | Status: AC | PRN
  Administered 2023-10-12: 100 mL via INTRAVENOUS

## 2023-10-12 MED ORDER — ACETAMINOPHEN 325 MG PO TABS
650.0000 mg | ORAL_TABLET | Freq: Four times a day (QID) | ORAL | Status: DC | PRN
  Administered 2023-10-12 (×2): 650 mg via ORAL
  Filled 2023-10-12 (×3): qty 2

## 2023-10-12 MED ORDER — LACTATED RINGERS IV SOLN: INTRAVENOUS | Status: DC

## 2023-10-12 MED ORDER — FENTANYL CITRATE PF 50 MCG/ML IJ SOSY
50.0000 ug | PREFILLED_SYRINGE | INTRAMUSCULAR | Status: DC | PRN
  Administered 2023-10-12: 50 ug via INTRAVENOUS
  Filled 2023-10-12: qty 1

## 2023-10-12 MED ORDER — DIATRIZOATE MEGLUMINE & SODIUM 66-10 % PO SOLN
90.0000 mL | Freq: Once | ORAL | Status: AC
  Administered 2023-10-12: 90 mL via ORAL
  Filled 2023-10-12: qty 90

## 2023-10-12 MED ORDER — SODIUM CHLORIDE 0.9 % IV BOLUS (SEPSIS)
1000.0000 mL | Freq: Once | INTRAVENOUS | Status: AC
  Administered 2023-10-12: 1000 mL via INTRAVENOUS

## 2023-10-12 NOTE — H&P (Signed)
 History and Physical  Abigail Golden:[id number] DOB: 1966/06/20 DOA: 10/11/2023  Referring physician: Dr. Wallis Gun, EDP  PCP: [name], PA-C  Outpatient Specialists: Medical oncology. Patient coming from: Home.  Chief Complaint: Abdominal pain and watery stools.  HPI: Abigail Golden is a 58 y.o. 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  Associated with watery stools and nausea without vomiting.  Symptoms persisted all day.  Since her pain was not improving she presented to the ER for further evaluation.  In the ER, afebrile.  Contrasted CT abdomen and pelvis revealed findings consistent with a distal partial small bowel obstruction.  The patient received 2 L IV fluid boluses, IV antiemetics, and IV opiate-based analgesics in the ER.  [facility], hospitalist service, was asked to admit.  The patient is alert and oriented x 3.  She had another watery stools while here in the ER.   ED Course: Temperature 97.4.  BP 120/63, pulse 76, respiration rate 12, O2 saturation 97% on room air.  CBC is unremarkable.  Creatinine 1.3 with GFR 47 at baseline.  Review of Systems: Review of systems as noted in the HPI. All other systems reviewed and are negative.   Past Medical History:  Diagnosis Date   Allergy    Anxiety    Chronic bronchitis (HCC)    Depression    Headache    Hypoglycemia    Lower back pain    since fall ~ 2012 (05/25/2015)   Migraine    05/25/2015 "probably once/month"   Migraine headache    Pain in left hip    since fall ~ 2012 (05/25/2015)   Pneumonia "several times"   Past Surgical History:  Procedure Laterality Date   ABDOMINAL HYSTERECTOMY  11/2002   "found benign tumors"   BRONCHIAL BIOPSY  04/24/2021   Procedure: BRONCHIAL BIOPSIES;  Surgeon:  Prudy Brownie, DO;  Location: [facility];  Service: Pulmonary;;   BRONCHIAL BRUSHINGS  04/24/2021   Procedure: BRONCHIAL BRUSHINGS;  Surgeon: Prudy Brownie, DO;  Location: [facility];  Service: Pulmonary;;   BRONCHIAL NEEDLE ASPIRATION BIOPSY  04/24/2021   Procedure: BRONCHIAL NEEDLE ASPIRATION BIOPSIES;  Surgeon: Prudy Brownie, DO;  Location: [facility];  Service: Pulmonary;;   CHEST TUBE INSERTION  04/24/2021   Procedure: CHEST TUBE INSERTION;  Surgeon: Prudy Brownie, DO;  Location: [facility];  Service: Pulmonary;;   CHOLECYSTECTOMY N/A 05/25/2015   Procedure: LAPAROSCOPIC CHOLECYSTECTOMY;  Surgeon: Shela Derby, MD;  Location: [facility];  Service: General;  Laterality: N/A;   LAPAROSCOPIC CHOLECYSTECTOMY  05/25/2015   MENISCUS REPAIR Right 03/2017   VIDEO BRONCHOSCOPY WITH ENDOBRONCHIAL NAVIGATION N/A 04/24/2021   Procedure: VIDEO BRONCHOSCOPY WITH ENDOBRONCHIAL NAVIGATION;  Surgeon: Prudy Brownie, DO;  Location: [facility];  Service: Pulmonary;  Laterality: N/A;   VIDEO BRONCHOSCOPY WITH RADIAL ENDOBRONCHIAL ULTRASOUND  04/24/2021   Procedure: VIDEO BRONCHOSCOPY WITH RADIAL ENDOBRONCHIAL ULTRASOUND;  Surgeon: Prudy Brownie, DO;  Location: [facility];  Service: Pulmonary;;    Social History:  reports that she has never smoked. She has never used smokeless tobacco. She reports that she does not drink alcohol and does not use drugs.   Allergies  Allergen Reactions   Celebrex [Celecoxib] Rash    On face itching   Minocycline Rash    On face Itching  Family History  Problem Relation Age of Onset   Heart disease Mother    Zoanne Hinders White syndrome Father    Fredrick Jenkins Parkinson White syndrome Paternal Grandmother    Diabetes Paternal Grandmother    Stomach cancer Paternal Grandmother    Breast cancer Neg Hx       Prior to Admission medications   Medication Sig Start Date End Date Taking? Authorizing Provider  acetaminophen  (TYLENOL ) 500 MG tablet Take  500 mg by mouth every 6 (six) hours as needed for moderate pain or headache.   Yes [provider]  albuterol  (VENTOLIN  HFA) 108 (90 Base) MCG/ACT inhaler Inhale 2 puffs into the lungs every 4 (four) hours as needed. 06/21/19  Yes [provider]  ALPRAZolam  (XANAX ) 0.25 MG tablet Take 0.25 mg by mouth every 4 (four) hours as needed for anxiety. for anxiety 07/16/18  Yes [provider]  aspirin EC 81 MG tablet Take 81 mg by mouth daily. Swallow whole.   Yes [provider]  benzonatate (TESSALON) 100 MG capsule Take 200 mg by mouth 3 (three) times daily. 09/28/23  Yes [provider]  citalopram  (CELEXA ) 10 MG tablet Take 10 mg by mouth at bedtime. 08/16/18  Yes [provider]  Diclofenac Potassium,Migraine, 50 MG PACK Take 1 each by mouth as needed. As needed for migraines   Yes [provider]  diclofenac sodium (VOLTAREN) 1 % GEL Apply topically as needed. To affected area   Yes [provider]  diphenhydrAMINE  (BENADRYL ) 25 MG tablet Take 25 mg by mouth See admin instructions. 25 mg at bedtime 25 mg as needed for itching   Yes [provider]  Docusate Sodium  (STOOL SOFTENER LAXATIVE PO) Take 1 tablet by mouth as needed.   Yes [provider]  eletriptan (RELPAX) 20 MG tablet Take 20 mg by mouth as needed for headache (at onset of migraine.). May repeat in 2 hours if needed.   Yes [provider]  fluticasone (FLONASE) 50 MCG/ACT nasal spray Place 2 sprays into both nostrils daily as needed for allergies. 02/12/17  Yes [provider]  HYDROcodone -acetaminophen  (NORCO/VICODIN) 5-325 MG tablet Take 1 tablet by mouth every 6 (six) hours as needed for moderate pain (pain score 4-6).   Yes [provider]  hydrOXYzine  (ATARAX /VISTARIL ) 25 MG tablet Take 25 mg by mouth See admin instructions. 25 mg at bedtime 25 mg during the day as needed for itching/anxiety   Yes [provider]   loratadine  (CLARITIN ) 10 MG tablet Take 10 mg by mouth daily.   Yes [provider]  montelukast  (SINGULAIR ) 10 MG tablet Take 10 mg by mouth at bedtime.   Yes [provider]  nystatin  powder APPLY  POWDER TOPICALLY TO AFFECTED AREA THREE TIMES DAILY Patient taking differently: Apply 1 Application topically as needed. APPLY  POWDER TOPICALLY TO AFFECTED AREA THREE TIMES DAILY 11/28/22  Yes Sonja [place], MD  ondansetron  (ZOFRAN -ODT) 4 MG disintegrating tablet DISSOLVE 1 TABLET IN MOUTH EVERY 8 HOURS AS NEEDED FOR NAUSEA AND VOMITING 09/08/23  Yes Sonja [place], MD  osimertinib  mesylate (TAGRISSO ) 80 MG tablet Take 1 tablet (80 mg total) by mouth daily. 09/08/23  Yes Sonja , MD  prochlorperazine  (COMPAZINE ) 10 MG tablet TAKE 1 TABLET BY MOUTH EVERY 6 HOURS AS NEEDED FOR NAUSEA FOR VOMITING 01/14/22  Yes Sonja , MD  solifenacin (VESICARE) 10 MG tablet Take 10 mg by mouth every evening.   Yes [provider]  triamcinolone cream (KENALOG) 0.1 %  Apply 1 Application topically 2 (two) times daily.   Yes [provider]  zolpidem  (AMBIEN ) 10 MG tablet TAKE 1/2 TO 1 (ONE-HALF TO ONE) TABLET BY MOUTH AT BEDTIME AS NEEDED FOR SLEEP 07/07/23  Yes Sonja [place], MD  cefdinir (OMNICEF) 300 MG capsule Take 300 mg by mouth 2 (two) times daily. Patient not taking: Reported on 10/12/2023 09/28/23   [provider]  magic mouthwash w/lidocaine  SOLN Take 5 mLs by mouth every 6 hours as needed for mouth pain. Swish and Swallow Patient not taking: Reported on 09/25/2023 06/03/21   Sonja [place], MD  predniSONE  (DELTASONE ) 20 MG tablet Take 20 mg by mouth as directed. Patient not taking: Reported on 10/12/2023 09/28/23   [provider]    Physical Exam: BP (!) 119/51   Pulse 65   Temp (!) 97.4 F (36.3 C) (Oral)   Resp 18   SpO2 100%   General: 58 y.o. year-old female well developed well nourished in no acute distress.  Alert and oriented x3. Cardiovascular: Regular rate and  rhythm with no rubs or gallops.  No thyromegaly or JVD noted.  No lower extremity edema. 2/4 pulses in all 4 extremities. Respiratory: Clear to auscultation with no wheezes or rales. Good inspiratory effort. Abdomen: Soft diffuse tenderness with palpation.  Hypoactive bowel sounds.   Muskuloskeletal: No cyanosis, clubbing or edema noted bilaterally Neuro: CN II-XII intact, strength, sensation, reflexes Skin: No ulcerative lesions noted or rashes Psychiatry: Judgement and insight appear normal. Mood is appropriate for condition and setting          Labs on Admission:  Basic Metabolic Panel: Recent Labs  Lab 10/11/23 2250  NA 137  K 4.4  CL 104  CO2 24  GLUCOSE 119*  BUN 19  CREATININE 1.33*  CALCIUM 9.6   Liver Function Tests: Recent Labs  Lab 10/11/23 2250  AST 21  ALT 20  ALKPHOS 63  BILITOT 0.9  PROT 7.3  ALBUMIN 3.9   Recent Labs  Lab 10/11/23 2250  LIPASE 35   No results for input(s): "AMMONIA" in the last 168 hours. CBC: Recent Labs  Lab 10/11/23 2250  WBC 6.8  HGB 12.6  HCT 39.5  MCV 88.0  PLT 185   Cardiac Enzymes: No results for input(s): "CKTOTAL", "CKMB", "CKMBINDEX", "TROPONINI" in the last 168 hours.  BNP (last 3 results) No results for input(s): "BNP" in the last 8760 hours.  ProBNP (last 3 results) No results for input(s): "PROBNP" in the last 8760 hours.  CBG: No results for input(s): "GLUCAP" in the last 168 hours.  Radiological Exams on Admission: CT ABDOMEN PELVIS W CONTRAST Result Date: 10/12/2023 CLINICAL DATA:  Right lower quadrant abdominal pain. EXAM: CT ABDOMEN AND PELVIS WITH CONTRAST TECHNIQUE: Multidetector CT imaging of the abdomen and pelvis was performed using the standard protocol following bolus administration of intravenous contrast. RADIATION DOSE REDUCTION: This exam was performed according to the departmental dose-optimization program which includes automated exposure control, adjustment of the mA and/or kV according  to patient size and/or use of iterative reconstruction technique. CONTRAST:  OMNIPAQUE  IOHEXOL  300 MG/ML  SOLN COMPARISON:  June 22, 2023 FINDINGS: Lower chest: No acute abnormality. Hepatobiliary: No focal liver abnormality is seen. Status post cholecystectomy. No biliary dilatation. Pancreas: Unremarkable. No pancreatic ductal dilatation or surrounding inflammatory changes. Spleen: Normal in size without focal abnormality. Adrenals/Urinary Tract: Adrenal glands are unremarkable. Kidneys are normal, without renal calculi, focal lesion, or hydronephrosis. Bladder is unremarkable. Stomach/Bowel: Stomach is within normal  limits. Appendix appears normal. Mildly dilated small bowel loops are seen within the right lower quadrant and right hemipelvis (maximum small bowel diameter of approximately 3.3 cm). A midline transition zone is suspected within the anterior aspect of the lower abdomen (coronal reformatted images 63 through 75, CT series 8) Vascular/Lymphatic: No significant vascular findings are present. No enlarged abdominal or pelvic lymph nodes. Reproductive: Status post hysterectomy. No adnexal masses. Other: No abdominal wall hernia or abnormality. No abdominopelvic ascites. Musculoskeletal: Moderate severity degenerative changes are seen at the level of L3-L4. IMPRESSION: 1. Findings consistent with a distal partial small bowel obstruction. 2. Evidence of prior cholecystectomy and prior hysterectomy. Electronically Signed   By: Virgle Grime M.D.   On: 10/12/2023 02:00    EKG: I independently viewed the EKG done and my findings are as followed: Sinus rhythm rate of 60.  Nonspecific ST-T changes with QTc 451.  Assessment/Plan Present on Admission:  Partial small bowel obstruction (HCC)  Principal Problem:   Partial small bowel obstruction (HCC)  Partial small bowel obstruction, seen on CT scan. Reports nausea without vomiting Gastrografin  p.o., abdominal x-ray in the morning. IV  fluid hydration IV antiemetics as needed Optimize magnesium and potassium levels Early mobilization Consider general surgery involvement if no improvement of her symptoms.  Stage IV small cell lung cancer with mets to brain No acute issues, follows with medical oncology outpatient  CKD 3B At baseline Avoid nephrotoxic agents, dehydration and hypotension. Continue IV fluid hydration  Obesity Weight 120 kg Recommend weight loss outpatient with regular physical activity and healthy diet.   Time: 65 minutes.   DVT prophylaxis: Subcu Lovenox  daily.  Code Status: Full code.  Family Communication: None at bedside.  Disposition Plan: Admitted to telemetry unit.  Consults called: None.  Admission status: Observation status.   Status is: Observation    Bary Boss MD [facility] Pager [phone]  If 7PM-7AM, please contact night-coverage [email or web address] Password [facility]  10/12/2023, 4:48 AM

## 2023-10-12 NOTE — ED Notes (Signed)
 Patient transported to CT

## 2023-10-12 NOTE — ED Notes (Addendum)
 Pt advised she was feeling a little better and gave permission to establish an IV. After obtaining access and drawing blood, pt advised she still felt too dizzy to move.

## 2023-10-12 NOTE — ED Notes (Signed)
 After the first attempt at obtaining blood from pt, pt began complaining of dizziness and dry heaving. Pt advised this was a normal reaction to needles. Pt was allowed to elevate feet and given a cold rag per her request.

## 2023-10-12 NOTE — ED Notes (Signed)
 Patient appears awake, alert and oriented. Breathing even and unlabored. No other needs at this time.

## 2023-10-12 NOTE — Plan of Care (Signed)

## 2023-10-12 NOTE — Progress Notes (Signed)
 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  Associated with watery stools and nausea without vomiting.  Symptoms persisted all day.  Since her pain was not improving she presented to the ER for further evaluation. CT abdomen and pelvis revealed findings consistent with a distal partial small bowel obstruction.  The patient received 2 L IV fluid boluses, IV antiemetics, and IV opiate-based analgesics in the ER.  Patient admitted for further evaluation.  Assessment & Plan:   Principal Problem:   Partial small bowel obstruction (HCC)  Partial small bowel obstruction: Patient presented with diffuse abdominal pain associated with nausea, no vomiting. CT abdomen and pelvis showed partial small bowel obstruction. Continue bowel rest, no need for NG tube since denies nausea and vomiting. Continue IV fluid hydration Continue IV antiemetics as needed Optimize magnesium and potassium levels Early mobilization. Consider general surgery involvement if no improvement of her symptoms. Gastrografin  p.o. x-ray completed , report pending.   Stage IV small cell lung cancer with mets to brain No acute issues, follows with medical oncology outpatient   CKD 3B: Serum creatinine at baseline. Avoid nephrotoxic agents, dehydration and hypotension. Continue IV fluid hydration   Obesity: Weight 120 kg Recommend weight loss outpatient with regular physical activity and healthy diet.    DVT prophylaxis: Lovenox  Code Status:Full code Family Communication: No family at bed side Disposition Plan:    Status is: Observation The patient remains OBS  appropriate and will d/c before 2 midnights.   Patient admitted for partial small bowel obstruction, requiring bowel rest, IV hydration pain control  Consultants:  None  Procedures: CT A/P  Antimicrobials:  Anti-infectives (From admission, onward)    None      Subjective: Patient was seen and examined at bedside.  Overnight events noted. Patient continued to complain of abdominal pain but improving. Denies Nausea and vomiting.  Pain is controlled  Objective: Vitals:   10/12/23 1145 10/12/23 1200 10/12/23 1206 10/12/23 1251  BP:  122/60    Pulse: 94 79    Resp: 20 15 19    Temp:    (!) 97 F (36.1 C)  TempSrc:    Oral  SpO2: 99% 96%    Weight:      Height:        Intake/Output Summary (Last 24 hours) at 10/12/2023 1305 Last data filed at 10/12/2023 0414 Gross per 24 hour  Intake 2000 ml  Output --  Net 2000 ml   Filed Weights   10/12/23 0748  Weight: 117.9 kg    Examination:  General exam: Appears calm and comfortable, not in any acute distress. Respiratory system: Clear to auscultation. Respiratory effort normal. RR 17 Cardiovascular system: S1 & S2 heard, RRR. No JVD, murmurs, rubs, gallops or clicks. No pedal edema. Gastrointestinal system: Abdomen is Mildly distended, soft and  mildly tender.  Bowel sounds sluggish. Central nervous system: Alert and oriented x 3. No focal neurological deficits. Extremities: No edema, no cyanosis, no clubbing Skin: No rashes, lesions or ulcers Psychiatry: Judgement and insight appear normal. Mood & affect appropriate.     Data Reviewed: I have personally reviewed following labs and imaging studies  CBC: Recent Labs  Lab 10/11/23 2250 10/12/23 0607  WBC 6.8 4.1  HGB 12.6 10.4*  HCT 39.5 32.4*  MCV 88.0 88.3  PLT 185 123*   Basic Metabolic Panel: Recent Labs  Lab 10/11/23 2250 10/12/23 0607  NA 137 133*  K 4.4 4.4  CL 104 105  CO2 24 21*  GLUCOSE 119* 108*  BUN 19 17  CREATININE 1.33* 1.24*  CALCIUM 9.6  8.3*  MG  --  2.1  PHOS  --  3.7   GFR: Estimated Creatinine Clearance: 59.6 mL/min (A) (by C-G formula based on SCr of 1.24 mg/dL (H)). Liver Function Tests: Recent Labs  Lab 10/11/23 2250  AST 21  ALT 20  ALKPHOS 63  BILITOT 0.9  PROT 7.3  ALBUMIN 3.9   Recent Labs  Lab 10/11/23 2250  LIPASE 35   No results for input(s): "AMMONIA" in the last 168 hours. Coagulation Profile: No results for input(s): "INR", "PROTIME" in the last 168 hours. Cardiac Enzymes: No results for input(s): "CKTOTAL", "CKMB", "CKMBINDEX", "TROPONINI" in the last 168 hours. BNP (last 3 results) No results for input(s): "PROBNP" in the last 8760 hours. HbA1C: No results for input(s): "HGBA1C" in the last 72 hours. CBG: No results for input(s): "GLUCAP" in the last 168 hours. Lipid Profile: No results for input(s): "CHOL", "HDL", "LDLCALC", "TRIG", "CHOLHDL", "LDLDIRECT" in the last 72 hours. Thyroid Function Tests: No results for input(s): "TSH", "T4TOTAL", "FREET4", "T3FREE", "THYROIDAB" in the last 72 hours. Anemia Panel: No results for input(s): "VITAMINB12", "FOLATE", "FERRITIN", "TIBC", "IRON", "RETICCTPCT" in the last 72 hours. Sepsis Labs: No results for input(s): "PROCALCITON", "LATICACIDVEN" in the last 168 hours.  Recent Results (from the past 240 hours)  C Difficile Quick Screen w PCR reflex     Status: None   Collection Time: 10/11/23 11:13 PM   Specimen: STOOL  Result Value Ref Range Status   C Diff antigen NEGATIVE NEGATIVE Final   C Diff toxin NEGATIVE NEGATIVE Final   C Diff interpretation No C. difficile detected.  Final    Comment: Performed at [facility], [address], [place], [place] [address]    Radiology Studies: CT ABDOMEN PELVIS W CONTRAST Result Date: 10/12/2023 CLINICAL DATA:  Right lower quadrant abdominal pain. EXAM: CT ABDOMEN AND PELVIS WITH CONTRAST TECHNIQUE: Multidetector CT imaging of the abdomen and pelvis was performed using the  standard protocol following bolus administration of intravenous contrast. RADIATION DOSE REDUCTION: This exam was performed according to the departmental dose-optimization program which includes automated exposure control, adjustment of the mA and/or kV according to patient size and/or use of iterative reconstruction technique. CONTRAST:  OMNIPAQUE  IOHEXOL  300 MG/ML  SOLN COMPARISON:  June 22, 2023 FINDINGS: Lower chest: No acute abnormality. Hepatobiliary: No focal liver abnormality is seen. Status post cholecystectomy. No biliary dilatation. Pancreas: Unremarkable. No pancreatic ductal dilatation or surrounding inflammatory changes. Spleen: Normal in size without focal abnormality. Adrenals/Urinary Tract: Adrenal glands are unremarkable. Kidneys are normal, without renal calculi, focal lesion, or hydronephrosis. Bladder is unremarkable. Stomach/Bowel: Stomach is within normal limits. Appendix appears normal. Mildly dilated small bowel loops are seen within the right lower quadrant and right hemipelvis (maximum small bowel diameter of approximately 3.3 cm). A midline transition zone is suspected within the anterior aspect of the lower abdomen (coronal reformatted images 63 through 75, CT series 8) Vascular/Lymphatic: No significant vascular findings are present. No enlarged abdominal or pelvic lymph nodes. Reproductive: Status post hysterectomy. No adnexal masses. Other: No abdominal wall hernia or abnormality. No  abdominopelvic ascites. Musculoskeletal: Moderate severity degenerative changes are seen at the level of L3-L4. IMPRESSION: 1. Findings consistent with a distal partial small bowel obstruction. 2. Evidence of prior cholecystectomy and prior hysterectomy. Electronically Signed   By: Virgle Grime M.D.   On: 10/12/2023 02:00   Scheduled Meds:  enoxaparin  (LOVENOX ) injection  40 mg Subcutaneous Q24H   Continuous Infusions:  lactated ringers        LOS: 0 days    Time spent: 50  Mins    Magdalene School, MD [facility]   If 7PM-7AM, please contact night-coverage

## 2023-10-12 NOTE — ED Notes (Signed)
 Pt appeared more pale and complained of continued dizziness and the urge to defecate. Vitals were obtained and physician made aware. When attempting to move patient to a wheelchair pt was unable to bear weight due to near syncope. Patient was lifted, moved to the hospital bed and taken to [facility] room 10.

## 2023-10-12 NOTE — ED Notes (Signed)
 Assisted patient to restroom ambulatory. No signs of N/V or shortness of breath.

## 2023-10-13 DIAGNOSIS — K56600 Partial intestinal obstruction, unspecified as to cause: Secondary | ICD-10-CM | POA: Diagnosis not present

## 2023-10-13 MED ORDER — LACTATED RINGERS IV SOLN: INTRAVENOUS | Status: DC

## 2023-10-13 NOTE — Plan of Care (Signed)
  Problem: Clinical Measurements: Goal: Ability to maintain clinical measurements within normal limits will improve Outcome: Progressing Goal: Respiratory complications will improve Outcome: Progressing Goal: Cardiovascular complication will be avoided Outcome: Progressing   Problem: Elimination: Goal: Will not experience complications related to bowel motility Outcome: Progressing   Problem: Pain Managment: Goal: General experience of comfort will improve and/or be controlled Outcome: Progressing

## 2023-10-13 NOTE — Consult Note (Addendum)
 Consult Note  KINZLEIGH KANDLER 01-Oct-1965  [id number].    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 Pain was in lower abdomen. Multiple episodes of non-bloody diarrhea in the ED. Recent omnicef for sinus infection as well. Prior abdominal surgery includes hysterectomy and laparoscopic cholecystectomy. She had a CT that questioned distal PSBO. She continues to have bowel function and reports pain is now improved.   ROS: Negative other than HPI  Family History  Problem Relation Age of Onset   Heart disease Mother    Zoanne Hinders White syndrome Father    Fredrick Jenkins Parkinson White syndrome Paternal Grandmother    Diabetes Paternal Grandmother    Stomach cancer Paternal Grandmother    Breast cancer Neg Hx     Past Medical History:  Diagnosis Date   Allergy    Anxiety    Chronic bronchitis (HCC)    Depression    Headache    Hypoglycemia    Lower back pain    since fall ~ 2012 (05/25/2015)   Migraine    05/25/2015 "probably once/month"   Migraine headache    Pain in left hip    since fall ~ 2012 (05/25/2015)   Pneumonia "several times"    Past Surgical History:  Procedure Laterality Date   ABDOMINAL HYSTERECTOMY  11/2002   "found benign tumors"   BRONCHIAL BIOPSY  04/24/2021   Procedure: BRONCHIAL BIOPSIES;  Surgeon: Prudy Brownie, DO;  Location: [facility];  Service: Pulmonary;;   BRONCHIAL BRUSHINGS  04/24/2021   Procedure: BRONCHIAL BRUSHINGS;  Surgeon: Prudy Brownie, DO;  Location: [facility];  Service: Pulmonary;;   BRONCHIAL NEEDLE ASPIRATION BIOPSY  04/24/2021   Procedure: BRONCHIAL NEEDLE ASPIRATION BIOPSIES;  Surgeon: Prudy Brownie,  DO;  Location: [facility];  Service: Pulmonary;;   CHEST TUBE INSERTION  04/24/2021   Procedure: CHEST TUBE INSERTION;  Surgeon: Prudy Brownie, DO;  Location: [facility];  Service: Pulmonary;;   CHOLECYSTECTOMY N/A 05/25/2015   Procedure: LAPAROSCOPIC CHOLECYSTECTOMY;  Surgeon: Shela Derby, MD;  Location: [facility];  Service: General;  Laterality: N/A;   LAPAROSCOPIC CHOLECYSTECTOMY  05/25/2015   MENISCUS REPAIR Right 03/2017   VIDEO BRONCHOSCOPY WITH ENDOBRONCHIAL NAVIGATION N/A 04/24/2021   Procedure: VIDEO BRONCHOSCOPY WITH ENDOBRONCHIAL NAVIGATION;  Surgeon: Prudy Brownie, DO;  Location: [facility];  Service: Pulmonary;  Laterality: N/A;   VIDEO BRONCHOSCOPY WITH RADIAL ENDOBRONCHIAL ULTRASOUND  04/24/2021   Procedure: VIDEO BRONCHOSCOPY WITH RADIAL ENDOBRONCHIAL ULTRASOUND;  Surgeon: Prudy Brownie, DO;  Location: [facility];  Service: Pulmonary;;    Social History:  reports that she has never smoked. She has never used smokeless tobacco. She reports that she does not drink alcohol and does not use drugs.  Allergies:  Allergies  Allergen Reactions   Celebrex [Celecoxib] Rash    On face itching   Minocycline Rash    On face Itching    Medications Prior to Admission  Medication Sig Dispense Refill   acetaminophen  (TYLENOL ) 500 MG tablet Take 500 mg by mouth every 6 (six) hours as needed for moderate pain or headache.     albuterol  (VENTOLIN  HFA) 108 (90 Base) MCG/ACT inhaler Inhale 2 puffs into  the lungs every 4 (four) hours as needed.     ALPRAZolam  (XANAX ) 0.25 MG tablet Take 0.25 mg by mouth every 4 (four) hours as needed for anxiety. for anxiety     aspirin EC 81 MG tablet Take 81 mg by mouth daily. Swallow whole.     benzonatate (TESSALON) 100 MG capsule Take 200 mg by mouth 3 (three) times daily.     citalopram  (CELEXA ) 10 MG tablet Take 10 mg by mouth at bedtime.     Diclofenac Potassium,Migraine, 50 MG PACK Take 1 each by mouth as needed. As needed for  migraines     diclofenac sodium (VOLTAREN) 1 % GEL Apply topically as needed. To affected area     diphenhydrAMINE  (BENADRYL ) 25 MG tablet Take 25 mg by mouth See admin instructions. 25 mg at bedtime 25 mg as needed for itching     Docusate Sodium  (STOOL SOFTENER LAXATIVE PO) Take 1 tablet by mouth as needed.     eletriptan (RELPAX) 20 MG tablet Take 20 mg by mouth as needed for headache (at onset of migraine.). May repeat in 2 hours if needed.     fluticasone (FLONASE) 50 MCG/ACT nasal spray Place 2 sprays into both nostrils daily as needed for allergies.     HYDROcodone -acetaminophen  (NORCO/VICODIN) 5-325 MG tablet Take 1 tablet by mouth every 6 (six) hours as needed for moderate pain (pain score 4-6).     hydrOXYzine  (ATARAX /VISTARIL ) 25 MG tablet Take 25 mg by mouth See admin instructions. 25 mg at bedtime 25 mg during the day as needed for itching/anxiety     loratadine  (CLARITIN ) 10 MG tablet Take 10 mg by mouth daily.     montelukast  (SINGULAIR ) 10 MG tablet Take 10 mg by mouth at bedtime.     nystatin  powder APPLY  POWDER TOPICALLY TO AFFECTED AREA THREE TIMES DAILY (Patient taking differently: Apply 1 Application topically as needed. APPLY  POWDER TOPICALLY TO AFFECTED AREA THREE TIMES DAILY) 60 g 1   ondansetron  (ZOFRAN -ODT) 4 MG disintegrating tablet DISSOLVE 1 TABLET IN MOUTH EVERY 8 HOURS AS NEEDED FOR NAUSEA AND VOMITING 18 tablet 1   osimertinib  mesylate (TAGRISSO ) 80 MG tablet Take 1 tablet (80 mg total) by mouth daily. 30 tablet 3   prochlorperazine  (COMPAZINE ) 10 MG tablet TAKE 1 TABLET BY MOUTH EVERY 6 HOURS AS NEEDED FOR NAUSEA FOR VOMITING 30 tablet 1   solifenacin (VESICARE) 10 MG tablet Take 10 mg by mouth every evening.     triamcinolone cream (KENALOG) 0.1 % Apply 1 Application topically 2 (two) times daily.     zolpidem  (AMBIEN ) 10 MG tablet TAKE 1/2 TO 1 (ONE-HALF TO ONE) TABLET BY MOUTH AT BEDTIME AS NEEDED FOR SLEEP 60 tablet 0   cefdinir (OMNICEF) 300 MG capsule Take  300 mg by mouth 2 (two) times daily. (Patient not taking: Reported on 10/12/2023)     magic mouthwash w/lidocaine  SOLN Take 5 mLs by mouth every 6 hours as needed for mouth pain. Swish and Swallow (Patient not taking: Reported on 09/25/2023) 250 mL 1   predniSONE  (DELTASONE ) 20 MG tablet Take 20 mg by mouth as directed. (Patient not taking: Reported on 10/12/2023)      Blood pressure (!) 151/70, pulse 64, temperature 97.8 F (36.6 C), temperature source Oral, resp. rate 14, height 5' (1.524 m), weight 117.9 kg, SpO2 98%. Physical Exam:  General: pleasant, WD, morbidly obese female who is laying in bed in NAD Heart: regular, rate, and rhythm.  Lungs: Respiratory effort  nonlabored Abd: soft, NT, ND, no masses, hernias, or organomegaly MS: all 4 extremities are symmetrical with no cyanosis, clubbing, or edema. Skin: warm and dry with no masses, lesions, or rashes Psych: A&Ox3 with an appropriate affect.   Results for orders placed or performed during the hospital encounter of 10/11/23 (from the past 48 hours)  Lipase, blood     Status: None   Collection Time: 10/11/23 10:50 PM  Result Value Ref Range   Lipase 35 11 - 51 U/L    Comment: Performed at [facility], [address] [place], [place] [address]  Comprehensive metabolic panel     Status: Abnormal   Collection Time: 10/11/23 10:50 PM  Result Value Ref Range   Sodium 137 135 - 145 mmol/L   Potassium 4.4 3.5 - 5.1 mmol/L   Chloride 104 98 - 111 mmol/L   CO2 24 22 - 32 mmol/L   Glucose, Bld 119 (H) 70 - 99 mg/dL    Comment: Glucose reference range applies only to samples taken after fasting for at least 8 hours.   BUN 19 6 - 20 mg/dL   Creatinine, Ser 6.04 (H) 0.44 - 1.00 mg/dL   Calcium 9.6 8.9 - 54.0 mg/dL   Total Protein 7.3 6.5 - 8.1 g/dL   Albumin 3.9 3.5 - 5.0 g/dL   AST 21 15 - 41 U/L   ALT 20 0 - 44 U/L   Alkaline Phosphatase 63 38 - 126 U/L   Total Bilirubin 0.9 0.0 - 1.2 mg/dL   GFR, Estimated 47  (L) >60 mL/min    Comment: (NOTE) Calculated using the CKD-EPI Creatinine Equation (2021)    Anion gap 9 5 - 15    Comment: Performed at [facility], [address] [place], [place] [address]  CBC     Status: None   Collection Time: 10/11/23 10:50 PM  Result Value Ref Range   WBC 6.8 4.0 - 10.5 K/uL   RBC 4.49 3.87 - 5.11 MIL/uL   Hemoglobin 12.6 12.0 - 15.0 g/dL   HCT 14.7 82.9 - 56.2 %   MCV 88.0 80.0 - 100.0 fL   MCH 28.1 26.0 - 34.0 pg   MCHC 31.9 30.0 - 36.0 g/dL   RDW 13.0 86.5 - 78.4 %   Platelets 185 150 - 400 K/uL   nRBC 0.0 0.0 - 0.2 %    Comment: Performed at [facility], [address] [place], [place] [address]  C Difficile Quick Screen w PCR reflex     Status: None   Collection Time: 10/11/23 11:13 PM   Specimen: STOOL  Result Value Ref Range   C Diff antigen NEGATIVE NEGATIVE   C Diff toxin NEGATIVE NEGATIVE   C Diff interpretation No C. difficile detected.     Comment: Performed at [facility], [address] [place], [place] [address]  POC occult blood, ED     Status: None   Collection Time: 10/11/23 11:13 PM  Result Value Ref Range   Fecal Occult Bld NEGATIVE NEGATIVE  CBC     Status: Abnormal   Collection Time: 10/12/23  6:07 AM  Result Value Ref Range   WBC 4.1 4.0 - 10.5 K/uL   RBC 3.67 (L) 3.87 - 5.11 MIL/uL   Hemoglobin 10.4 (L) 12.0 - 15.0 g/dL   HCT 32.4 (L) 40.1 - 02.7 %   MCV 88.3 80.0 - 100.0 fL   MCH 28.3 26.0 - 34.0 pg  MCHC 32.1 30.0 - 36.0 g/dL   RDW 16.1 09.6 - 04.5 %   Platelets 123 (L) 150 - 400 K/uL   nRBC 0.0 0.0 - 0.2 %    Comment: Performed at [facility], [address], [place], [place] [address]  Basic metabolic panel     Status: Abnormal   Collection Time: 10/12/23  6:07 AM  Result Value Ref Range   Sodium 133 (L) 135 - 145 mmol/L   Potassium 4.4 3.5 - 5.1 mmol/L   Chloride 105 98 - 111 mmol/L   CO2 21 (L) 22 - 32 mmol/L   Glucose, Bld 108  (H) 70 - 99 mg/dL    Comment: Glucose reference range applies only to samples taken after fasting for at least 8 hours.   BUN 17 6 - 20 mg/dL   Creatinine, Ser 1.91 (H) 0.44 - 1.00 mg/dL   Calcium 8.3 (L) 8.9 - 10.3 mg/dL   GFR, Estimated 51 (L) >60 mL/min    Comment: (NOTE) Calculated using the CKD-EPI Creatinine Equation (2021)    Anion gap 7 5 - 15    Comment: Performed at [facility], [address] [place], [place] [address]  Magnesium     Status: None   Collection Time: 10/12/23  6:07 AM  Result Value Ref Range   Magnesium 2.1 1.7 - 2.4 mg/dL    Comment: Performed at [facility], [address], [place], [place] [address]  Phosphorus     Status: None   Collection Time: 10/12/23  6:07 AM  Result Value Ref Range   Phosphorus 3.7 2.5 - 4.6 mg/dL    Comment: Performed at [facility], [address], [place], [place] [address]  HIV Antibody (routine testing w rflx)     Status: None   Collection Time: 10/12/23  7:00 AM  Result Value Ref Range   HIV Screen 4th Generation wRfx Non Reactive Non Reactive    Comment: Performed at [facility], [address] [place], [place] [address]   DG Abd 1 View Result Date: 10/12/2023 CLINICAL DATA:  SBO EXAM: ABDOMEN - 1 VIEW COMPARISON:  None Available. FINDINGS: The bowel gas pattern is normal. Retained oral contrast in colon and rectosigmoid. No radio-opaque calculi or other significant radiographic abnormality are seen. IMPRESSION: Negative. Electronically Signed   By: Sydell Eva M.D.   On: 10/12/2023 20:34   DG Abd Portable 1V Result Date: 10/12/2023 CLINICAL DATA:  Small bowel obstruction right follow-up EXAM: PORTABLE ABDOMEN - 1 VIEW COMPARISON:  CT 10/12/2023 FINDINGS: Contrast material noted within the colon and decompressed lower abdominal/pelvic small bowel loops. Slight prominence of lower abdominal small bowel loops again noted. No free air or organomegaly.  IMPRESSION: Contrast material noted throughout the colon and in decompressed distal small bowel loops. Slight prominence of mid to lower abdominal small bowel loops could reflect mild ileus or early low grade obstruction. Electronically Signed   By: Janeece Mechanic M.D.   On: 10/12/2023 13:56   CT ABDOMEN PELVIS W CONTRAST Result Date: 10/12/2023 CLINICAL DATA:  Right lower quadrant abdominal pain. EXAM: CT ABDOMEN AND PELVIS WITH CONTRAST TECHNIQUE: Multidetector CT imaging of the abdomen and pelvis was performed using the standard protocol following bolus administration of intravenous contrast. RADIATION DOSE REDUCTION: This exam was performed according to the departmental dose-optimization program which includes automated exposure control, adjustment of the mA and/or kV according to patient size and/or use of iterative reconstruction technique.  CONTRAST:  OMNIPAQUE  IOHEXOL  300 MG/ML  SOLN COMPARISON:  June 22, 2023 FINDINGS: Lower chest: No acute abnormality. Hepatobiliary: No focal liver abnormality is seen. Status post cholecystectomy. No biliary dilatation. Pancreas: Unremarkable. No pancreatic ductal dilatation or surrounding inflammatory changes. Spleen: Normal in size without focal abnormality. Adrenals/Urinary Tract: Adrenal glands are unremarkable. Kidneys are normal, without renal calculi, focal lesion, or hydronephrosis. Bladder is unremarkable. Stomach/Bowel: Stomach is within normal limits. Appendix appears normal. Mildly dilated small bowel loops are seen within the right lower quadrant and right hemipelvis (maximum small bowel diameter of approximately 3.3 cm). A midline transition zone is suspected within the anterior aspect of the lower abdomen (coronal reformatted images 63 through 75, CT series 8) Vascular/Lymphatic: No significant vascular findings are present. No enlarged abdominal or pelvic lymph nodes. Reproductive: Status post hysterectomy. No adnexal masses. Other: No abdominal  wall hernia or abnormality. No abdominopelvic ascites. Musculoskeletal: Moderate severity degenerative changes are seen at the level of L3-L4. IMPRESSION: 1. Findings consistent with a distal partial small bowel obstruction. 2. Evidence of prior cholecystectomy and prior hysterectomy. Electronically Signed   By: Virgle Grime M.D.   On: 10/12/2023 02:00      Assessment/Plan Enteritis  - CT yesterday with concern for distal PSBO although small bowel appears minimally dilated on my own review - pt was given PO gastrografin  yesterday and passed to colon on 8h delay film, follow up KUB later in the evening also showed normal bowel gas pattern - given hx of recent diarrhea prior to this, more consistent with enteritis and mild reactive ileus  - no indication for acute surgical intervention or involvement at this time - general surgery will sign off - agree with GI panel to rule out infectious source, could also be related to oral chemotherapy  - supportive care  - if persistent symptoms or worsening can consult GI but do not feel they have much to add acutely at this time  FEN: ok to ADAT to soft/low fiber, IVF per [facility] VTE: LMWH ID: no current abx   I reviewed ED provider notes, hospitalist notes, last 24 h vitals and pain scores, last 48 h intake and output, last 24 h labs and trends, and last 24 h imaging results.  This care required high  level of medical decision making.   Annetta Killian, [facility] 10/13/2023, 8:33 AM Please see Amion for pager number during day hours 7:00am-4:30pm

## 2023-10-13 NOTE — Discharge Instructions (Signed)
 Small bowel obstruction / Ileus has resolved. Her symptoms are related to possible viral enteritis. Continue supportive care.  Encourage oral hydration.

## 2023-10-13 NOTE — Discharge Summary (Signed)
 Physician Discharge Summary  Abigail Golden UJW:[id number] DOB: February 26, 1966 DOA: 10/11/2023  PCP: [name], PA-C  Admit date: 10/11/2023  Discharge date: 10/13/2023  Admitted From: Home.  Disposition:  Home.  Recommendations for Outpatient Follow-up:  Follow up with PCP in 1-2 weeks. Please obtain BMP/CBC in one week. Small bowel obstruction / Ileus has resolved. Her symptoms are related to possible viral enteritis. Continue supportive care.  Encourage oral hydration.  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  Associated with watery stools and nausea without vomiting.  Symptoms persisted all day.  Since her pain was not improving she presented to the ER for further evaluation. CT abdomen and pelvis revealed findings consistent with a distal partial small bowel obstruction.  The patient received 2 L IV fluid boluses, IV antiemetics, and IV opiate-based analgesics in the ER.  Patient admitted for further evaluation.  Patient was kept n.p.o., continued on pain control and IV hydration. General surgery consulted. Patient subsequently improved,  Small bowel or ileus obstruction has resolved.  Patient started on clear liquid diet, She tolerated well,  advanced to soft diet.  Repeat x-ray abdomen showed resolved small bowel obstruction.  General surgery signed off , Patient wants to be discharged.  Patient being discharged home.  Discharge Diagnoses:  Principal Problem:   Partial small bowel obstruction (HCC)  Partial small bowel obstruction: Patient presented with diffuse abdominal pain associated with  nausea, no vomiting. CT abdomen and pelvis showed partial small bowel obstruction. Continue bowel rest, no need for NG tube since denies nausea and vomiting. Continue IV fluid hydration Continue IV antiemetics as needed Optimize magnesium and potassium levels. Early mobilization. Consider general surgery involvement if no improvement of her symptoms. Gastrografin  p.o. x-ray completed , SBO Resolved. Patient tolerated soft diet.  Wants to be discharged.    Stage IV small cell lung cancer with mets to brain No acute issues, follows with medical oncology outpatient.   CKD 3B: Serum creatinine at baseline. Avoid nephrotoxic agents, dehydration and hypotension. Continue IV fluid hydration   Obesity: Weight 120 kg Recommend weight loss outpatient with regular physical activity and healthy diet.    Discharge Instructions  Discharge Instructions     Call MD for:  persistant dizziness or light-headedness   Complete by: As directed    Call MD for:  persistant nausea and vomiting   Complete by: As directed    Diet - low sodium heart healthy   Complete by: As directed    Diet general   Complete by: As directed    Discharge instructions   Complete by: As directed    Advised to follow-up with primary care physician in 1 week. Small bowel obstruction / Ileus has resolved. Her symptoms are related to possible viral enteritis. Continue supportive care.  Encourage oral hydration.   Increase activity slowly   Complete by: As directed       Allergies as of 10/13/2023       Reactions   Celebrex [celecoxib] Rash   On face itching   Minocycline Rash   On face Itching        Medication List     STOP taking these medications  cefdinir 300 MG capsule Commonly known as: OMNICEF   magic mouthwash w/lidocaine  Soln   predniSONE  20 MG tablet Commonly known as: DELTASONE        TAKE these medications    acetaminophen  500 MG tablet Commonly known as: TYLENOL  Take 500  mg by mouth every 6 (six) hours as needed for moderate pain or headache.   albuterol  108 (90 Base) MCG/ACT inhaler Commonly known as: VENTOLIN  HFA Inhale 2 puffs into the lungs every 4 (four) hours as needed.   ALPRAZolam  0.25 MG tablet Commonly known as: XANAX  Take 0.25 mg by mouth every 4 (four) hours as needed for anxiety. for anxiety   aspirin EC 81 MG tablet Take 81 mg by mouth daily. Swallow whole.   benzonatate 100 MG capsule Commonly known as: TESSALON Take 200 mg by mouth 3 (three) times daily.   citalopram  10 MG tablet Commonly known as: CELEXA  Take 10 mg by mouth at bedtime.   Diclofenac Potassium(Migraine) 50 MG Pack Take 1 each by mouth as needed. As needed for migraines   diclofenac sodium 1 % Gel Commonly known as: VOLTAREN Apply topically as needed. To affected area   diphenhydrAMINE  25 MG tablet Commonly known as: BENADRYL  Take 25 mg by mouth See admin instructions. 25 mg at bedtime 25 mg as needed for itching   eletriptan 20 MG tablet Commonly known as: RELPAX Take 20 mg by mouth as needed for headache (at onset of migraine.). May repeat in 2 hours if needed.   fluticasone 50 MCG/ACT nasal spray Commonly known as: FLONASE Place 2 sprays into both nostrils daily as needed for allergies.   HYDROcodone -acetaminophen  5-325 MG tablet Commonly known as: NORCO/VICODIN Take 1 tablet by mouth every 6 (six) hours as needed for moderate pain (pain score 4-6).   hydrOXYzine  25 MG tablet Commonly known as: ATARAX  Take 25 mg by mouth See admin instructions. 25 mg at bedtime 25 mg during the day as needed for itching/anxiety   loratadine  10 MG tablet Commonly known as: CLARITIN  Take 10 mg by mouth daily.   montelukast  10 MG tablet Commonly known as: SINGULAIR  Take 10 mg by mouth at bedtime.   nystatin  powder Commonly known as: nystatin  APPLY  POWDER TOPICALLY TO AFFECTED AREA THREE TIMES DAILY What changed:  how much to take how to take this when to  take this reasons to take this   ondansetron  4 MG disintegrating tablet Commonly known as: ZOFRAN -ODT DISSOLVE 1 TABLET IN MOUTH EVERY 8 HOURS AS NEEDED FOR NAUSEA AND VOMITING   prochlorperazine  10 MG tablet Commonly known as: COMPAZINE  TAKE 1 TABLET BY MOUTH EVERY 6 HOURS AS NEEDED FOR NAUSEA FOR VOMITING   solifenacin 10 MG tablet Commonly known as: VESICARE Take 10 mg by mouth every evening.   STOOL SOFTENER LAXATIVE PO Take 1 tablet by mouth as needed.   Tagrisso  80 MG tablet Generic drug: osimertinib  mesylate Take 1 tablet (80 mg total) by mouth daily.   triamcinolone cream 0.1 % Commonly known as: KENALOG Apply 1 Application topically 2 (two) times daily.   zolpidem  10 MG tablet Commonly known as: AMBIEN  TAKE 1/2 TO 1 (ONE-HALF TO ONE) TABLET BY MOUTH AT BEDTIME AS NEEDED FOR SLEEP        Follow-up Information     [name], PA-C Follow up in 1 week(s).   Specialty: Family Medicine Contact information: [address] [place] [address] [phone]  Allergies  Allergen Reactions   Celebrex [Celecoxib] Rash    On face itching   Minocycline Rash    On face Itching    Consultations: General Surgery   Procedures/Studies: DG Abd 1 View Result Date: 10/12/2023 CLINICAL DATA:  SBO EXAM: ABDOMEN - 1 VIEW COMPARISON:  None Available. FINDINGS: The bowel gas pattern is normal. Retained oral contrast in colon and rectosigmoid. No radio-opaque calculi or other significant radiographic abnormality are seen. IMPRESSION: Negative. Electronically Signed   By: Sydell Eva M.D.   On: 10/12/2023 20:34   DG Abd Portable 1V Result Date: 10/12/2023 CLINICAL DATA:  Small bowel obstruction right follow-up EXAM: PORTABLE ABDOMEN - 1 VIEW COMPARISON:  CT 10/12/2023 FINDINGS: Contrast material noted within the colon and decompressed lower abdominal/pelvic small bowel loops. Slight prominence of lower abdominal small bowel loops again  noted. No free air or organomegaly. IMPRESSION: Contrast material noted throughout the colon and in decompressed distal small bowel loops. Slight prominence of mid to lower abdominal small bowel loops could reflect mild ileus or early low grade obstruction. Electronically Signed   By: Janeece Mechanic M.D.   On: 10/12/2023 13:56   CT ABDOMEN PELVIS W CONTRAST Result Date: 10/12/2023 CLINICAL DATA:  Right lower quadrant abdominal pain. EXAM: CT ABDOMEN AND PELVIS WITH CONTRAST TECHNIQUE: Multidetector CT imaging of the abdomen and pelvis was performed using the standard protocol following bolus administration of intravenous contrast. RADIATION DOSE REDUCTION: This exam was performed according to the departmental dose-optimization program which includes automated exposure control, adjustment of the mA and/or kV according to patient size and/or use of iterative reconstruction technique. CONTRAST:  OMNIPAQUE  IOHEXOL  300 MG/ML  SOLN COMPARISON:  June 22, 2023 FINDINGS: Lower chest: No acute abnormality. Hepatobiliary: No focal liver abnormality is seen. Status post cholecystectomy. No biliary dilatation. Pancreas: Unremarkable. No pancreatic ductal dilatation or surrounding inflammatory changes. Spleen: Normal in size without focal abnormality. Adrenals/Urinary Tract: Adrenal glands are unremarkable. Kidneys are normal, without renal calculi, focal lesion, or hydronephrosis. Bladder is unremarkable. Stomach/Bowel: Stomach is within normal limits. Appendix appears normal. Mildly dilated small bowel loops are seen within the right lower quadrant and right hemipelvis (maximum small bowel diameter of approximately 3.3 cm). A midline transition zone is suspected within the anterior aspect of the lower abdomen (coronal reformatted images 63 through 75, CT series 8) Vascular/Lymphatic: No significant vascular findings are present. No enlarged abdominal or pelvic lymph nodes. Reproductive: Status post hysterectomy. No  adnexal masses. Other: No abdominal wall hernia or abnormality. No abdominopelvic ascites. Musculoskeletal: Moderate severity degenerative changes are seen at the level of L3-L4. IMPRESSION: 1. Findings consistent with a distal partial small bowel obstruction. 2. Evidence of prior cholecystectomy and prior hysterectomy. Electronically Signed   By: Virgle Grime M.D.   On: 10/12/2023 02:00   MR Brain W Wo Contrast Result Date: 09/22/2023 CLINICAL DATA:  Brain metastasis, assess treatment response. History of primary adenocarcinoma of the left lung diagnosed in 2022. Single fraction SRS first treated on 03/30/2023. Stable disease in December 2024. EXAM: MRI HEAD WITHOUT AND WITH CONTRAST TECHNIQUE: Multiplanar, multiecho pulse sequences of the brain and surrounding structures were obtained without and with intravenous contrast. CONTRAST:  10mL GADAVIST  GADOBUTROL  1 MMOL/ML IV SOLN COMPARISON:  MRI brain 06/19/2023 and earlier. FINDINGS: Brain: Redemonstrated irregular enhancement within the inferior left frontal lobe measuring approximately 19 x 10 mm, previously 17 x 8 mm in 06/19/2023 and approximately 16 x 7 mm in 03/18/2023 (series 1100, image 168). Adjacent component of  nodular enhancement in the anterior superior left temporal lobe measures up to 13 mm in length, previously 10 mm. There is slightly increased edema in the inferior left frontal lobe extending into the external capsule. 10 x 8 x 9 mm focus of nodular enhancement involving the anterior aspect of the left superior frontal gyrus, previously 6 x 2 x 4 mm (series 1100, image 228). Increased associated edema compared to 06/19/2023. New punctate focus of susceptibility could reflect post treatment changes. 5 x 4 x 4 mm nodular enhancement involving the parasagittal aspect of the right superior frontal gyrus previously measuring 3.5 x 2 x 3 mm (series 1100 image 252). Mild associated edema is increased from prior. 3 mm focus of enhancement in the  left parietal lobe near the vertex is unchanged. There is no additional faint punctate focus of enhancement in the left parietal lobe which is not appreciated on prior studies (series 1100, image 243). Foci of enhancement along the posterior aspect of the left sylvian fissure is unchanged from 03/18/2023 and is favored to reflect a vascular etiology. No acute infarct. No midline shift. Unremarkable appearance of the cerebellum and brainstem. Basilar cisterns are patent. No extra-axial fluid collections. Ventricles: Normal size and configuration of the ventricles. Vascular: Skull base flow voids are visualized. Skull and upper cervical spine: No focal abnormality. Sinuses/Orbits: Orbits are symmetric. Paranasal sinuses are clear. Other: Trace fluid in the mastoid air cells slightly greater on the right. IMPRESSION: Increased size of anterior left frontal lobe lesion and parasagittal right frontal lobe lesion with increased surrounding edema. Findings concerning for disease progression. Recommend short interval follow-up MRI. Stable appearance of small left parietal lesion. There is a new punctate focus of enhancement in the high left parietal lobe concerning for a new metastatic lesion. Irregular enhancement in the inferior left frontal lobe and anterior superior left temporal lobe has slowly progressively increased in size over multiple prior studies. Slightly increased associated edema extending into the external capsule. Findings may reflect post treatment changes versus additional site of metastatic disease. Recommend attention on follow-up. Electronically Signed   By: Denny Flack M.D.   On: 09/22/2023 18:25    Subjective: Patient was seen and examined at bedside.  Overnight events noted.   Patient reports doing much better , She wants to be discharged , she tolerated soft bland diet.   Abdominal pain has resolved,  small bowel obstruction has resolved.  Discharge Exam: Vitals:   10/13/23 0529  10/13/23 1317  BP: (!) 151/70 (!) 141/64  Pulse: 64 66  Resp: 14 17  Temp: 97.8 F (36.6 C) 98.5 F (36.9 C)  SpO2: 98% 94%   Vitals:   10/12/23 2132 10/13/23 0139 10/13/23 0529 10/13/23 1317  BP: 120/60 138/61 (!) 151/70 (!) 141/64  Pulse: 65 62 64 66  Resp: 14 14 14 17   Temp: 98 F (36.7 C) (!) 97.5 F (36.4 C) 97.8 F (36.6 C) 98.5 F (36.9 C)  TempSrc: Oral Oral Oral Oral  SpO2: 98% 98% 98% 94%  Weight:      Height:        General: Pt is alert, awake, not in acute distress Cardiovascular: RRR, S1/S2 +, no rubs, no gallops Respiratory: CTA bilaterally, no wheezing, no rhonchi Abdominal: Soft, NT, ND, bowel sounds + Extremities: no edema, no cyanosis    The results of significant diagnostics from this hospitalization (including imaging, microbiology, ancillary and laboratory) are listed below for reference.     Microbiology: Recent Results (from the past  240 hours)  C Difficile Quick Screen w PCR reflex     Status: None   Collection Time: 10/11/23 11:13 PM   Specimen: STOOL  Result Value Ref Range Status   C Diff antigen NEGATIVE NEGATIVE Final   C Diff toxin NEGATIVE NEGATIVE Final   C Diff interpretation No C. difficile detected.  Final    Comment: Performed at [facility], [address] [place], [place] [address]     Labs: BNP (last 3 results) No results for input(s): "BNP" in the last 8760 hours. Basic Metabolic Panel: Recent Labs  Lab 10/11/23 2250 10/12/23 0607  NA 137 133*  K 4.4 4.4  CL 104 105  CO2 24 21*  GLUCOSE 119* 108*  BUN 19 17  CREATININE 1.33* 1.24*  CALCIUM 9.6 8.3*  MG  --  2.1  PHOS  --  3.7   Liver Function Tests: Recent Labs  Lab 10/11/23 2250  AST 21  ALT 20  ALKPHOS 63  BILITOT 0.9  PROT 7.3  ALBUMIN 3.9   Recent Labs  Lab 10/11/23 2250  LIPASE 35   No results for input(s): "AMMONIA" in the last 168 hours. CBC: Recent Labs  Lab 10/11/23 2250 10/12/23 0607  WBC 6.8 4.1  HGB 12.6  10.4*  HCT 39.5 32.4*  MCV 88.0 88.3  PLT 185 123*   Cardiac Enzymes: No results for input(s): "CKTOTAL", "CKMB", "CKMBINDEX", "TROPONINI" in the last 168 hours. BNP: Invalid input(s): "POCBNP" CBG: No results for input(s): "GLUCAP" in the last 168 hours. D-Dimer No results for input(s): "DDIMER" in the last 72 hours. Hgb A1c No results for input(s): "HGBA1C" in the last 72 hours. Lipid Profile No results for input(s): "CHOL", "HDL", "LDLCALC", "TRIG", "CHOLHDL", "LDLDIRECT" in the last 72 hours. Thyroid function studies No results for input(s): "TSH", "T4TOTAL", "T3FREE", "THYROIDAB" in the last 72 hours.  Invalid input(s): "FREET3" Anemia work up No results for input(s): "VITAMINB12", "FOLATE", "FERRITIN", "TIBC", "IRON", "RETICCTPCT" in the last 72 hours. Urinalysis    Component Value Date/Time   COLORURINE YELLOW 11/18/2022 1638   APPEARANCEUR HAZY (A) 11/18/2022 1638   LABSPEC 1.023 11/18/2022 1638   PHURINE 5.0 11/18/2022 1638   GLUCOSEU NEGATIVE 11/18/2022 1638   HGBUR NEGATIVE 11/18/2022 1638   BILIRUBINUR NEGATIVE 11/18/2022 1638   KETONESUR NEGATIVE 11/18/2022 1638   PROTEINUR 30 (A) 11/18/2022 1638   NITRITE NEGATIVE 11/18/2022 1638   LEUKOCYTESUR TRACE (A) 11/18/2022 1638   Sepsis Labs Recent Labs  Lab 10/11/23 2250 10/12/23 0607  WBC 6.8 4.1   Microbiology Recent Results (from the past 240 hours)  C Difficile Quick Screen w PCR reflex     Status: None   Collection Time: 10/11/23 11:13 PM   Specimen: STOOL  Result Value Ref Range Status   C Diff antigen NEGATIVE NEGATIVE Final   C Diff toxin NEGATIVE NEGATIVE Final   C Diff interpretation No C. difficile detected.  Final    Comment: Performed at [facility], [address], [place], [place] [address]     Time coordinating discharge: Over 30 minutes  SIGNED:   Magdalene School, MD  [facility] 10/13/2023, 3:04 PM Pager   If 7PM-7AM, please contact  night-coverage

## 2023-10-13 NOTE — Care Management Obs Status (Signed)
 MEDICARE OBSERVATION STATUS NOTIFICATION   Patient Details  Name: Abigail Golden MRN: [id number] Date of Birth: 1966/01/07   Medicare Observation Status Notification Given:  Yes    Loreda Rodriguez, RN 10/13/2023, 10:01 AM

## 2023-10-14 LAB — GASTROINTESTINAL PANEL BY PCR, STOOL (REPLACES STOOL CULTURE)
Adenovirus F40/41: NOT DETECTED
Astrovirus: NOT DETECTED
Campylobacter species: NOT DETECTED
Cryptosporidium: NOT DETECTED
Cyclospora cayetanensis: NOT DETECTED
Entamoeba histolytica: NOT DETECTED
Enteroaggregative E coli (EAEC): NOT DETECTED
Enteropathogenic E coli (EPEC): NOT DETECTED
Enterotoxigenic E coli (ETEC): NOT DETECTED
Giardia lamblia: NOT DETECTED
Norovirus GI/GII: NOT DETECTED
Plesimonas shigelloides: NOT DETECTED
Rotavirus A: NOT DETECTED
Salmonella species: NOT DETECTED
Sapovirus (I, II, IV, and V): NOT DETECTED
Shiga like toxin producing E coli (STEC): NOT DETECTED
Shigella/Enteroinvasive E coli (EIEC): NOT DETECTED
Vibrio cholerae: NOT DETECTED
Vibrio species: DETECTED — AB
Yersinia enterocolitica: NOT DETECTED

## 2023-10-15 ENCOUNTER — Other Ambulatory Visit: Payer: Self-pay

## 2023-10-16 ENCOUNTER — Encounter (HOSPITAL_COMMUNITY)
Admission: RE | Admit: 2023-10-16 | Discharge: 2023-10-16 | Disposition: A | Discharge status: Home / Self Care | Source: Ambulatory Visit | Attending: Hematology | Admitting: Hematology

## 2023-10-16 DIAGNOSIS — C3412 Malignant neoplasm of upper lobe, left bronchus or lung: Secondary | ICD-10-CM | POA: Insufficient documentation

## 2023-10-16 LAB — GLUCOSE, CAPILLARY: Glucose-Capillary: 91 mg/dL (ref 70–99)

## 2023-10-16 MED ORDER — FLUDEOXYGLUCOSE F - 18 (FDG) INJECTION
13.0000 | Freq: Once | INTRAVENOUS | Status: AC
  Administered 2023-10-16: 12.89 via INTRAVENOUS

## 2023-10-19 ENCOUNTER — Encounter (HOSPITAL_COMMUNITY): Payer: Self-pay

## 2023-10-19 ENCOUNTER — Emergency Department (HOSPITAL_COMMUNITY)

## 2023-10-19 ENCOUNTER — Inpatient Hospital Stay (HOSPITAL_COMMUNITY)
Admission: EM | Admit: 2023-10-19 | Discharge: 2023-10-22 | DRG: 389 | Disposition: A | Discharge status: Home / Self Care | Attending: Internal Medicine | Admitting: Internal Medicine

## 2023-10-19 ENCOUNTER — Other Ambulatory Visit: Payer: Self-pay

## 2023-10-19 DIAGNOSIS — F32A Depression, unspecified: Secondary | ICD-10-CM | POA: Diagnosis present

## 2023-10-19 DIAGNOSIS — Z79899 Other long term (current) drug therapy: Secondary | ICD-10-CM

## 2023-10-19 DIAGNOSIS — Z8701 Personal history of pneumonia (recurrent): Secondary | ICD-10-CM | POA: Diagnosis not present

## 2023-10-19 DIAGNOSIS — Z90710 Acquired absence of both cervix and uterus: Secondary | ICD-10-CM | POA: Diagnosis not present

## 2023-10-19 DIAGNOSIS — F419 Anxiety disorder, unspecified: Secondary | ICD-10-CM | POA: Diagnosis present

## 2023-10-19 DIAGNOSIS — Z888 Allergy status to other drugs, medicaments and biological substances status: Secondary | ICD-10-CM | POA: Diagnosis not present

## 2023-10-19 DIAGNOSIS — Z923 Personal history of irradiation: Secondary | ICD-10-CM | POA: Diagnosis not present

## 2023-10-19 DIAGNOSIS — Z6841 Body Mass Index (BMI) 40.0 and over, adult: Secondary | ICD-10-CM

## 2023-10-19 DIAGNOSIS — R1031 Right lower quadrant pain: Principal | ICD-10-CM

## 2023-10-19 DIAGNOSIS — D72819 Decreased white blood cell count, unspecified: Secondary | ICD-10-CM | POA: Diagnosis present

## 2023-10-19 DIAGNOSIS — Z9221 Personal history of antineoplastic chemotherapy: Secondary | ICD-10-CM

## 2023-10-19 DIAGNOSIS — Z85118 Personal history of other malignant neoplasm of bronchus and lung: Secondary | ICD-10-CM

## 2023-10-19 DIAGNOSIS — C7931 Secondary malignant neoplasm of brain: Secondary | ICD-10-CM | POA: Diagnosis present

## 2023-10-19 DIAGNOSIS — Z800 Family history of malignant neoplasm of digestive organs: Secondary | ICD-10-CM

## 2023-10-19 DIAGNOSIS — Z833 Family history of diabetes mellitus: Secondary | ICD-10-CM | POA: Diagnosis not present

## 2023-10-19 DIAGNOSIS — Z881 Allergy status to other antibiotic agents status: Secondary | ICD-10-CM | POA: Diagnosis not present

## 2023-10-19 DIAGNOSIS — N1831 Chronic kidney disease, stage 3a: Secondary | ICD-10-CM | POA: Diagnosis present

## 2023-10-19 DIAGNOSIS — Z8249 Family history of ischemic heart disease and other diseases of the circulatory system: Secondary | ICD-10-CM

## 2023-10-19 DIAGNOSIS — Z7982 Long term (current) use of aspirin: Secondary | ICD-10-CM

## 2023-10-19 DIAGNOSIS — Z8719 Personal history of other diseases of the digestive system: Secondary | ICD-10-CM

## 2023-10-19 DIAGNOSIS — R109 Unspecified abdominal pain: Secondary | ICD-10-CM | POA: Diagnosis present

## 2023-10-19 DIAGNOSIS — K5669 Other partial intestinal obstruction: Principal | ICD-10-CM | POA: Diagnosis present

## 2023-10-19 DIAGNOSIS — Z9049 Acquired absence of other specified parts of digestive tract: Secondary | ICD-10-CM | POA: Diagnosis not present

## 2023-10-19 DIAGNOSIS — K56609 Unspecified intestinal obstruction, unspecified as to partial versus complete obstruction: Secondary | ICD-10-CM | POA: Diagnosis not present

## 2023-10-19 DIAGNOSIS — R1114 Bilious vomiting: Secondary | ICD-10-CM

## 2023-10-19 LAB — COMPREHENSIVE METABOLIC PANEL WITH GFR
ALT: 19 U/L (ref 0–44)
AST: 22 U/L (ref 15–41)
Albumin: 4.4 g/dL (ref 3.5–5.0)
Alkaline Phosphatase: 64 U/L (ref 38–126)
Anion gap: 10 (ref 5–15)
BUN: 17 mg/dL (ref 6–20)
CO2: 22 mmol/L (ref 22–32)
Calcium: 9.9 mg/dL (ref 8.9–10.3)
Chloride: 103 mmol/L (ref 98–111)
Creatinine, Ser: 1.21 mg/dL — ABNORMAL HIGH (ref 0.44–1.00)
GFR, Estimated: 53 mL/min — ABNORMAL LOW (ref >60)
Glucose, Bld: 147 mg/dL — ABNORMAL HIGH (ref 70–99)
Potassium: 4.4 mmol/L (ref 3.5–5.1)
Sodium: 135 mmol/L (ref 135–145)
Total Bilirubin: 0.9 mg/dL (ref 0.0–1.2)
Total Protein: 8 g/dL (ref 6.5–8.1)

## 2023-10-19 LAB — URINALYSIS, ROUTINE W REFLEX MICROSCOPIC
Bacteria, UA: NONE SEEN
Bilirubin Urine: NEGATIVE
Glucose, UA: NEGATIVE mg/dL
Hgb urine dipstick: NEGATIVE
Ketones, ur: NEGATIVE mg/dL
Leukocytes,Ua: NEGATIVE
Nitrite: NEGATIVE
Protein, ur: NEGATIVE mg/dL
Specific Gravity, Urine: 1.034 — ABNORMAL HIGH (ref 1.005–1.030)
pH: 5 (ref 5.0–8.0)

## 2023-10-19 LAB — LIPASE, BLOOD: Lipase: 36 U/L (ref 11–51)

## 2023-10-19 LAB — CBC
HCT: 42.3 % (ref 36.0–46.0)
Hemoglobin: 13.6 g/dL (ref 12.0–15.0)
MCH: 28.3 pg (ref 26.0–34.0)
MCHC: 32.2 g/dL (ref 30.0–36.0)
MCV: 88.1 fL (ref 80.0–100.0)
Platelets: 160 10*3/uL (ref 150–400)
RBC: 4.8 MIL/uL (ref 3.87–5.11)
RDW: 13.2 % (ref 11.5–15.5)
WBC: 5.1 10*3/uL (ref 4.0–10.5)
nRBC: 0 % (ref 0.0–0.2)

## 2023-10-19 MED ORDER — FENTANYL CITRATE PF 50 MCG/ML IJ SOSY
50.0000 ug | PREFILLED_SYRINGE | Freq: Once | INTRAMUSCULAR | Status: AC
  Administered 2023-10-19: 50 ug via INTRAMUSCULAR
  Filled 2023-10-19: qty 1

## 2023-10-19 MED ORDER — DIPHENHYDRAMINE HCL 50 MG/ML IJ SOLN
25.0000 mg | Freq: Once | INTRAMUSCULAR | Status: AC
  Administered 2023-10-19: 25 mg via INTRAVENOUS
  Filled 2023-10-19: qty 1

## 2023-10-19 MED ORDER — LACTATED RINGERS IV BOLUS
1000.0000 mL | Freq: Once | INTRAVENOUS | Status: AC
  Administered 2023-10-19: 1000 mL via INTRAVENOUS

## 2023-10-19 MED ORDER — IOHEXOL 300 MG/ML  SOLN
100.0000 mL | Freq: Once | INTRAMUSCULAR | Status: AC | PRN
  Administered 2023-10-19: 100 mL via INTRAVENOUS

## 2023-10-19 MED ORDER — PROCHLORPERAZINE EDISYLATE 10 MG/2ML IJ SOLN
10.0000 mg | Freq: Once | INTRAMUSCULAR | Status: AC
  Administered 2023-10-19: 10 mg via INTRAVENOUS
  Filled 2023-10-19: qty 2

## 2023-10-19 MED ORDER — HYDROMORPHONE HCL 1 MG/ML IJ SOLN: 0.5000 mg | INTRAMUSCULAR | Status: DC | PRN

## 2023-10-19 MED ORDER — ACETAMINOPHEN 650 MG RE SUPP: 650.0000 mg | Freq: Four times a day (QID) | RECTAL | Status: DC | PRN

## 2023-10-19 MED ORDER — OXYCODONE HCL 5 MG PO TABS
5.0000 mg | ORAL_TABLET | ORAL | Status: DC | PRN
  Administered 2023-10-20 – 2023-10-21 (×2): 5 mg via ORAL
  Filled 2023-10-19 (×2): qty 1

## 2023-10-19 MED ORDER — ENOXAPARIN SODIUM 40 MG/0.4ML IJ SOSY: 40.0000 mg | PREFILLED_SYRINGE | INTRAMUSCULAR | Status: DC

## 2023-10-19 MED ORDER — ACETAMINOPHEN 325 MG PO TABS
650.0000 mg | ORAL_TABLET | Freq: Four times a day (QID) | ORAL | Status: DC | PRN
  Administered 2023-10-20 – 2023-10-21 (×4): 650 mg via ORAL
  Filled 2023-10-19 (×4): qty 2

## 2023-10-19 MED ORDER — ONDANSETRON HCL 4 MG PO TABS
4.0000 mg | ORAL_TABLET | Freq: Four times a day (QID) | ORAL | Status: DC | PRN
  Filled 2023-10-19: qty 1

## 2023-10-19 MED ORDER — ENOXAPARIN SODIUM 60 MG/0.6ML IJ SOSY
60.0000 mg | PREFILLED_SYRINGE | INTRAMUSCULAR | Status: DC
  Administered 2023-10-19 – 2023-10-21 (×3): 60 mg via SUBCUTANEOUS
  Filled 2023-10-19 (×3): qty 0.6

## 2023-10-19 MED ORDER — PROMETHAZINE HCL 25 MG PO TABS
25.0000 mg | ORAL_TABLET | Freq: Once | ORAL | Status: AC
  Administered 2023-10-19: 25 mg via ORAL
  Filled 2023-10-19: qty 1

## 2023-10-19 MED ORDER — SODIUM CHLORIDE (PF) 0.9 % IJ SOLN
INTRAMUSCULAR | Status: AC
Start: 2023-10-19 — End: ?
  Filled 2023-10-19: qty 50

## 2023-10-19 MED ORDER — ALPRAZOLAM 0.25 MG PO TABS: 0.2500 mg | ORAL_TABLET | ORAL | Status: DC | PRN

## 2023-10-19 MED ORDER — ALBUTEROL SULFATE (2.5 MG/3ML) 0.083% IN NEBU: 2.5000 mg | INHALATION_SOLUTION | RESPIRATORY_TRACT | Status: DC | PRN

## 2023-10-19 MED ORDER — ONDANSETRON HCL 4 MG/2ML IJ SOLN
4.0000 mg | Freq: Four times a day (QID) | INTRAMUSCULAR | Status: DC | PRN
  Administered 2023-10-20: 4 mg via INTRAVENOUS
  Filled 2023-10-19: qty 2

## 2023-10-19 MED ORDER — LACTATED RINGERS IV SOLN: INTRAVENOUS | Status: AC

## 2023-10-19 NOTE — H&P (Signed)
 History and Physical  Abigail Golden GEX:[id number] DOB: [birth year] DOA: 10/19/2023  PCP: [name], PA-C   Chief Complaint: Recurrent abdominal pain  HPI: Abigail Golden is a 58 y.o. 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  Patient was admitted to the hospital overnight on 4/21 for partial small bowel obstruction, this resolved and the patient discharged home.  During that hospital stay, stool studies were done which were positive for vibrio species.  Patient was contacted after discharge, she stated that her soft stools were improved.  States that over the last few days, she had some intermittent abdominal discomfort returning, last night she had sudden worsening of this abdominal pain, and early this morning started having nausea and vomited a few times at home.  Presented to her doctor's office, and was then transferred to the ER where workup as detailed below shows evidence of bowel obstruction.  Currently she is not nauseous, has just minimal abdominal pain.  She denies any fevers or chills, any blood in her stool, her last bowel movement was actually earlier this morning, it was somewhat loose and soft but not completely watery.  Review of Systems: Please see HPI for pertinent positives and negatives. A complete 10 system review of systems are otherwise negative.  Past Medical History:  Diagnosis Date   Allergy    Anxiety    Chronic bronchitis (HCC)    Depression    Headache    Hypoglycemia    Lower back pain    since fall ~ 2012 (05/25/2015)   Migraine    05/25/2015 "probably once/month"   Migraine headache    Pain in left hip    since fall ~ 2012  (05/25/2015)   Pneumonia "several times"   Past Surgical History:  Procedure Laterality Date   ABDOMINAL HYSTERECTOMY  11/2002   "found benign tumors"   BRONCHIAL BIOPSY  04/24/2021   Procedure: BRONCHIAL BIOPSIES;  Surgeon: Prudy Brownie, DO;  Location: [facility];  Service: Pulmonary;;   BRONCHIAL BRUSHINGS  04/24/2021   Procedure: BRONCHIAL BRUSHINGS;  Surgeon: Prudy Brownie, DO;  Location: [facility];  Service: Pulmonary;;   BRONCHIAL NEEDLE ASPIRATION BIOPSY  04/24/2021   Procedure: BRONCHIAL NEEDLE ASPIRATION BIOPSIES;  Surgeon: Prudy Brownie, DO;  Location: [facility];  Service: Pulmonary;;   CHEST TUBE INSERTION  04/24/2021   Procedure: CHEST TUBE INSERTION;  Surgeon: Prudy Brownie, DO;  Location: [facility];  Service: Pulmonary;;   CHOLECYSTECTOMY N/A 05/25/2015   Procedure: LAPAROSCOPIC CHOLECYSTECTOMY;  Surgeon: Shela Derby, MD;  Location: [facility];  Service: General;  Laterality: N/A;   LAPAROSCOPIC CHOLECYSTECTOMY  05/25/2015   MENISCUS REPAIR Right 03/2017   VIDEO BRONCHOSCOPY WITH ENDOBRONCHIAL NAVIGATION N/A 04/24/2021   Procedure: VIDEO BRONCHOSCOPY WITH ENDOBRONCHIAL NAVIGATION;  Surgeon: Prudy Brownie, DO;  Location: [facility];  Service: Pulmonary;  Laterality: N/A;   VIDEO BRONCHOSCOPY WITH RADIAL ENDOBRONCHIAL ULTRASOUND  04/24/2021   Procedure: VIDEO BRONCHOSCOPY WITH RADIAL ENDOBRONCHIAL ULTRASOUND;  Surgeon: Prudy Brownie, DO;  Location: [facility];  Service: Pulmonary;;   Social History:  reports that she has never smoked. She has never used smokeless tobacco. She reports that she does not drink alcohol and does not use drugs.  Allergies  Allergen Reactions   Celebrex [Celecoxib] Rash    On face itching   Minocycline Rash    On face Itching    Family History  Problem Relation Age of Onset   Heart disease Mother    Zoanne Hinders White syndrome Father    Fredrick Jenkins Parkinson White syndrome Paternal Grandmother    Diabetes Paternal  Grandmother    Stomach cancer Paternal Grandmother    Breast cancer Neg Hx      Prior to Admission medications   Medication Sig Start Date End Date Taking? Authorizing Provider  acetaminophen  (TYLENOL ) 500 MG tablet Take 500 mg by mouth every 6 (six) hours as needed for moderate pain or headache.    [provider]  albuterol  (VENTOLIN  HFA) 108 (90 Base) MCG/ACT inhaler Inhale 2 puffs into the lungs every 4 (four) hours as needed. 06/21/19   [provider]  ALPRAZolam  (XANAX ) 0.25 MG tablet Take 0.25 mg by mouth every 4 (four) hours as needed for anxiety. for anxiety 07/16/18   [provider]  aspirin EC 81 MG tablet Take 81 mg by mouth daily. Swallow whole.    [provider]  benzonatate (TESSALON) 100 MG capsule Take 200 mg by mouth 3 (three) times daily. 09/28/23   [provider]  citalopram  (CELEXA ) 10 MG tablet Take 10 mg by mouth at bedtime. 08/16/18   [provider]  Diclofenac Potassium,Migraine, 50 MG PACK Take 1 each by mouth as needed. As needed for migraines    [provider]  diclofenac sodium (VOLTAREN) 1 % GEL Apply topically as needed. To affected area    [provider]  diphenhydrAMINE  (BENADRYL ) 25 MG tablet Take 25 mg by mouth See admin instructions. 25 mg at bedtime 25 mg as needed for itching    [provider]  Docusate Sodium  (STOOL SOFTENER LAXATIVE PO) Take 1 tablet by mouth as needed.    [provider]  eletriptan (RELPAX) 20 MG tablet Take 20 mg by mouth as needed for headache (at onset of migraine.). May repeat in 2 hours if needed.    [provider]  fluticasone (FLONASE) 50 MCG/ACT nasal spray Place 2 sprays into both nostrils daily as needed for allergies. 02/12/17   [provider]  HYDROcodone -acetaminophen  (NORCO/VICODIN) 5-325 MG tablet Take 1 tablet by mouth every 6 (six) hours as needed for moderate pain (pain score 4-6).    [provider]   hydrOXYzine  (ATARAX /VISTARIL ) 25 MG tablet Take 25 mg by mouth See admin instructions. 25 mg at bedtime 25 mg during the day as needed for itching/anxiety    [provider]  loratadine  (CLARITIN ) 10 MG tablet Take 10 mg by mouth daily.    [provider]  montelukast  (SINGULAIR ) 10 MG tablet Take 10 mg by mouth at bedtime.    [provider]  nystatin  powder APPLY  POWDER TOPICALLY TO AFFECTED AREA THREE TIMES DAILY Patient taking differently: Apply 1 Application topically as needed. APPLY  POWDER TOPICALLY TO AFFECTED AREA THREE TIMES DAILY 11/28/22   Sonja [place], MD  ondansetron  (ZOFRAN -ODT) 4 MG disintegrating tablet DISSOLVE 1 TABLET IN MOUTH EVERY 8 HOURS AS NEEDED FOR NAUSEA AND VOMITING 09/08/23   Sonja [place], MD  osimertinib  mesylate (TAGRISSO ) 80 MG tablet Take 1 tablet (80 mg total) by mouth daily. 09/08/23   Sonja [place], MD  prochlorperazine  (COMPAZINE ) 10 MG tablet TAKE 1 TABLET BY MOUTH EVERY 6 HOURS AS NEEDED FOR NAUSEA FOR VOMITING 01/14/22   Sonja [place],  MD  solifenacin (VESICARE) 10 MG tablet Take 10 mg by mouth every evening.    [provider]  triamcinolone cream (KENALOG) 0.1 % Apply 1 Application topically 2 (two) times daily.    [provider]  zolpidem  (AMBIEN ) 10 MG tablet TAKE 1/2 TO 1 (ONE-HALF TO ONE) TABLET BY MOUTH AT BEDTIME AS NEEDED FOR SLEEP 07/07/23   Sonja [place], MD    Physical Exam: BP (!) 148/81 (BP Location: Left Arm)   Pulse 62   Temp 97.9 F (36.6 C) (Oral)   Resp 12   SpO2 100%  General:  Alert, oriented, calm, in no acute distress  Eyes: EOMI, clear conjuctivae, white sclerea Neck: supple, no masses, trachea mildline  Cardiovascular: RRR, no murmurs or rubs, no peripheral edema  Respiratory: clear to auscultation bilaterally, no wheezes, no crackles  Abdomen: soft, nontender, slightly distended, normal bowel tones heard  Skin: dry, no rashes  Musculoskeletal: no joint effusions, normal range of motion   Psychiatric: appropriate affect, normal speech  Neurologic: extraocular muscles intact, clear speech, moving all extremities with intact sensorium         Labs on Admission:  Basic Metabolic Panel: Recent Labs  Lab 10/19/23 1220  NA 135  K 4.4  CL 103  CO2 22  GLUCOSE 147*  BUN 17  CREATININE 1.21*  CALCIUM 9.9   Liver Function Tests: Recent Labs  Lab 10/19/23 1220  AST 22  ALT 19  ALKPHOS 64  BILITOT 0.9  PROT 8.0  ALBUMIN 4.4   Recent Labs  Lab 10/19/23 1220  LIPASE 36   No results for input(s): "AMMONIA" in the last 168 hours. CBC: Recent Labs  Lab 10/19/23 1220  WBC 5.1  HGB 13.6  HCT 42.3  MCV 88.1  PLT 160   Cardiac Enzymes: No results for input(s): "CKTOTAL", "CKMB", "CKMBINDEX", "TROPONINI" in the last 168 hours. BNP (last 3 results) No results for input(s): "BNP" in the last 8760 hours.  ProBNP (last 3 results) No results for input(s): "PROBNP" in the last 8760 hours.  CBG: Recent Labs  Lab 10/16/23 1241  GLUCAP 91    Radiological Exams on Admission: CT ABDOMEN PELVIS W CONTRAST Result Date: 10/19/2023 CLINICAL DATA:  Right lower quadrant abdominal pain for 1 week, nausea and vomiting, chills, metastatic non-small cell lung cancer EXAM: CT ABDOMEN AND PELVIS WITH CONTRAST TECHNIQUE: Multidetector CT imaging of the abdomen and pelvis was performed using the standard protocol following bolus administration of intravenous contrast. RADIATION DOSE REDUCTION: This exam was performed according to the departmental dose-optimization program which includes automated exposure control, adjustment of the mA and/or kV according to patient size and/or use of iterative reconstruction technique. CONTRAST:  OMNIPAQUE  IOHEXOL  300 MG/ML  SOLN COMPARISON:  10/16/2023, 10/12/2023 FINDINGS: Lower chest: No acute pleural or parenchymal lung disease. Hepatobiliary: No focal liver abnormality is seen. Status post cholecystectomy. No biliary dilatation. Pancreas:  Unremarkable. No pancreatic ductal dilatation or surrounding inflammatory changes. Spleen: Normal in size without focal abnormality. Adrenals/Urinary Tract: Kidneys enhance normally and symmetrically. No urinary tract calculi or obstruction. The adrenals and bladder are unremarkable. Stomach/Bowel: Moderate dilation of the distal small bowel, measuring up to 3.6 cm in diameter, consistent with small bowel obstruction. There is an abrupt transition point within the distal jejunum right lower quadrant reference image 63/2. Colon is decompressed. Normal appendix right lower quadrant. No bowel wall thickening or inflammatory change. Vascular/Lymphatic: Aortic atherosclerosis. No enlarged abdominal or pelvic lymph nodes. Reproductive: Status post hysterectomy. No adnexal  masses. Other: No free fluid or free intraperitoneal gas. No abdominal wall hernia. Musculoskeletal: No acute or destructive bony abnormalities. Reconstructed images demonstrate no additional findings. IMPRESSION: 1. Small-bowel obstruction, with transition point within the distal jejunum right lower quadrant as above. 2.  Aortic Atherosclerosis (ICD10-I70.0). Electronically Signed   By: Bobbye Burrow M.D.   On: 10/19/2023 15:38   Assessment/Plan Abigail Golden is a 58 y.o. 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  Small bowel obstruction-with transition point in the distal jejunum.  Patient is currently comfortable and denies any significant nausea.  She did have a recent outpatient PET scan, concern would be that she may have bowel metastasis causing obstruction though this would be uncommon. -Inpatient admission -N.p.o. -IV  fluids -Pain and nausea medication as needed -General Surgery consulted by ER provider  Vibrio infection-from stool sample 4/22, diarrhea had essentially resolved prior to this admission, no indication for treatment  Anxiety-Xanax  every 4 hours as needed  Stage IV small cell lung cancer-under the care of Dr. Maryalice Smaller, will hold her Tagrisso  while admitted  DVT prophylaxis: Lovenox      Code Status: Full Code  Consults called: ER provider discussed with general surgery who will consult  Admission status: The appropriate patient status for this patient is INPATIENT. Inpatient status is judged to be reasonable and necessary in order to provide the required intensity of service to ensure the patient's safety. The patient's presenting symptoms, physical exam findings, and initial radiographic and laboratory data in the context of their chronic comorbidities is felt to place them at high risk for further clinical deterioration. Furthermore, it is not anticipated that the patient will be medically stable for discharge from the hospital within 2 midnights of admission.    I certify that at the point of admission it is my clinical judgment that the patient will require inpatient hospital care spanning beyond 2 midnights from the point of admission due to high intensity of service, high risk for further deterioration and high frequency of surveillance required  Time spent: 59 minutes  Leonia Heatherly Rickey Charm MD [facility] Pager [phone]  If 7PM-7AM, please contact night-coverage [email or web address] Password [facility]  10/19/2023, 5:01 PM

## 2023-10-19 NOTE — ED Provider Notes (Signed)
 [place] EMERGENCY DEPARTMENT AT [facility] Provider Note   CSN: [id number] Arrival date & time: 10/19/23  1022     History  Chief Complaint  Patient presents with   Abdominal Pain   Emesis    Abigail Golden is a 58 y.o. female with history of chronic bronchitis, migraines, low back pain, anxiety, depression, who presents emergency department complaining of abdominal pain for about a week.  Patient was admitted to the hospital last week and discharged after partial bowel obstruction, treated with medical management.  She had follow-up with her primary doctor this morning, she had worsening pain starting around 3 AM this morning with associated nausea and vomiting, as well as chills.  Pain is mainly in the mid to right lower quadrant of the abdomen.  Last bowel movement was this morning, and she is passing gas.   Abdominal Pain Associated symptoms: chills, nausea and vomiting   Emesis Associated symptoms: abdominal pain and chills        Home Medications Prior to Admission medications   Medication Sig Start Date End Date Taking? Authorizing Provider  acetaminophen  (TYLENOL ) 500 MG tablet Take 500 mg by mouth every 6 (six) hours as needed for moderate pain or headache.    [provider]  albuterol  (VENTOLIN  HFA) 108 (90 Base) MCG/ACT inhaler Inhale 2 puffs into the lungs every 4 (four) hours as needed. 06/21/19   [provider]  ALPRAZolam  (XANAX ) 0.25 MG tablet Take 0.25 mg by mouth every 4 (four) hours as needed for anxiety. for anxiety 07/16/18   [provider]  aspirin EC 81 MG tablet Take 81 mg by mouth daily. Swallow whole.    [provider]  benzonatate (TESSALON) 100 MG capsule Take 200 mg by mouth 3 (three) times daily. 09/28/23   [provider]  citalopram  (CELEXA ) 10 MG tablet Take 10 mg by mouth at bedtime. 08/16/18   [provider]  Diclofenac Potassium,Migraine, 50 MG PACK Take 1 each by mouth as  needed. As needed for migraines    [provider]  diclofenac sodium (VOLTAREN) 1 % GEL Apply topically as needed. To affected area    [provider]  diphenhydrAMINE  (BENADRYL ) 25 MG tablet Take 25 mg by mouth See admin instructions. 25 mg at bedtime 25 mg as needed for itching    [provider]  Docusate Sodium  (STOOL SOFTENER LAXATIVE PO) Take 1 tablet by mouth as needed.    [provider]  eletriptan (RELPAX) 20 MG tablet Take 20 mg by mouth as needed for headache (at onset of migraine.). May repeat in 2 hours if needed.    [provider]  fluticasone (FLONASE) 50 MCG/ACT nasal spray Place 2 sprays into both nostrils daily as needed for allergies. 02/12/17   [provider]  HYDROcodone -acetaminophen  (NORCO/VICODIN) 5-325 MG tablet Take 1 tablet by mouth every 6 (six) hours as needed for moderate pain (pain score 4-6).    [provider]  hydrOXYzine  (ATARAX /VISTARIL ) 25 MG tablet Take 25 mg by mouth See admin instructions. 25 mg at bedtime 25 mg during the day as needed for itching/anxiety    [provider]  loratadine  (CLARITIN ) 10 MG tablet Take 10 mg by mouth daily.    [provider]  montelukast  (SINGULAIR ) 10 MG tablet Take 10 mg by mouth at bedtime.    [provider]  nystatin  powder APPLY  POWDER TOPICALLY TO AFFECTED AREA THREE TIMES DAILY Patient taking differently: Apply 1  Application topically as needed. APPLY  POWDER TOPICALLY TO AFFECTED AREA THREE TIMES DAILY 11/28/22   Sonja [place], MD  ondansetron  (ZOFRAN -ODT) 4 MG disintegrating tablet DISSOLVE 1 TABLET IN MOUTH EVERY 8 HOURS AS NEEDED FOR NAUSEA AND VOMITING 09/08/23   Sonja [place], MD  osimertinib  mesylate (TAGRISSO ) 80 MG tablet Take 1 tablet (80 mg total) by mouth daily. 09/08/23   Sonja [place], MD  prochlorperazine  (COMPAZINE ) 10 MG tablet TAKE 1 TABLET BY MOUTH EVERY 6 HOURS AS NEEDED FOR NAUSEA FOR VOMITING 01/14/22   Sonja [place], MD   solifenacin (VESICARE) 10 MG tablet Take 10 mg by mouth every evening.    [provider]  triamcinolone cream (KENALOG) 0.1 % Apply 1 Application topically 2 (two) times daily.    [provider]  zolpidem  (AMBIEN ) 10 MG tablet TAKE 1/2 TO 1 (ONE-HALF TO ONE) TABLET BY MOUTH AT BEDTIME AS NEEDED FOR SLEEP 07/07/23   Sonja [place], MD      Allergies    Celebrex [celecoxib] and Minocycline    Review of Systems   Review of Systems  Constitutional:  Positive for chills.  Gastrointestinal:  Positive for abdominal pain, nausea and vomiting.  All other systems reviewed and are negative.   Physical Exam Updated Vital Signs BP (!) 148/81 (BP Location: Left Arm)   Pulse 62   Temp 97.9 F (36.6 C) (Oral)   Resp 12   SpO2 100%  Physical Exam Vitals and nursing note reviewed.  Constitutional:      Appearance: Normal appearance.  HENT:     Head: Normocephalic and atraumatic.  Eyes:     Conjunctiva/sclera: Conjunctivae normal.  Cardiovascular:     Rate and Rhythm: Normal rate and regular rhythm.  Pulmonary:     Effort: Pulmonary effort is normal. No respiratory distress.     Breath sounds: Normal breath sounds.  Abdominal:     General: There is no distension.     Palpations: Abdomen is soft.     Tenderness: There is abdominal tenderness in the right upper quadrant and right lower quadrant. There is guarding.  Skin:    General: Skin is warm and dry.  Neurological:     General: No focal deficit present.     Mental Status: She is alert.    ED Results / Procedures / Treatments   Labs (all labs ordered are listed, but only abnormal results are displayed) Labs Reviewed  COMPREHENSIVE METABOLIC PANEL WITH GFR - Abnormal; Notable for the following components:      Result Value   Glucose, Bld 147 (*)    Creatinine, Ser 1.21 (*)    GFR, Estimated 53 (*)    All other components within normal limits  LIPASE, BLOOD  CBC  URINALYSIS, ROUTINE W REFLEX MICROSCOPIC     EKG None  Radiology No results found.  Procedures Procedures    Medications Ordered in ED Medications  prochlorperazine  (COMPAZINE ) injection 10 mg (10 mg Intravenous Given 10/19/23 1244)  diphenhydrAMINE  (BENADRYL ) injection 25 mg (25 mg Intravenous Given 10/19/23 1245)  promethazine  (PHENERGAN ) tablet 25 mg (25 mg Oral Given 10/19/23 1253)  fentaNYL  (SUBLIMAZE ) injection 50 mcg (50 mcg Intramuscular Given 10/19/23 1253)  iohexol  (OMNIPAQUE ) 300 MG/ML solution 100 mL (100 mLs Intravenous Contrast Given 10/19/23 1458)    ED Course/ Medical Decision Making/ A&P  Medical Decision Making Amount and/or Complexity of Data Reviewed Labs: ordered. Radiology: ordered.  Risk Prescription drug management.  This patient is a 58 y.o. female  who presents to the ED for concern of abdominal pain.   Differential diagnoses prior to evaluation: The emergent differential diagnosis includes, but is not limited to,  AAA, mesenteric ischemia, appendicitis, diverticulitis, DKA, gastroenteritis, nephrolithiasis, pancreatitis, constipation, UTI, bowel obstruction, biliary disease, IBD, PUD, hepatitis. This is not an exhaustive differential.   Past Medical History / Co-morbidities / Social History: chronic bronchitis, migraines, low back pain, anxiety, depression, CKD stage IIIb, small cell lung cancer with known metastasis to the brain  Additional history: Chart reviewed. Pertinent results include: Reviewed past ER visit and subsequent admission on 4/20 after CT scan showed partial bowel obstruction.  She was discharged on 4/22.  Gastrointestinal panel showed vibrio species, I do not see where patient received antibiotics.  Physical Exam: Physical exam performed. The pertinent findings include: Hypertensive, otherwise normal vital signs.  Lab Tests/Imaging studies: I personally interpreted labs/imaging and the pertinent results include:  CBC and CMP grossly  unremarkable compared to prior. Lipase normal. UA not yet collected.   CT pending at time of shift change.   Medications: I ordered medication including fentanyl  IM, phenegran PO prior to IV access. Compazine  and benadryl  IV given afterwards. On reevaluation, pain somewhat improved, still some nausea but no vomiting. I have reviewed the patients home medicines and have made adjustments as needed.   Disposition: Patient discussed and care transferred to [facility] at shift change. Please see his/her note for further details regarding further ED course and disposition. Plan at time of handoff is follow up on CT imaging. Anticipate patient would likely require admission, but will be very dependent on imaging results. At this time greatest concern for recurrent bowel obstruction.    Final Clinical Impression(s) / ED Diagnoses Final diagnoses:  History of small bowel obstruction  RLQ abdominal pain  Bilious vomiting with nausea    Rx / DC Orders ED Discharge Orders     None      Portions of this report may have been transcribed using voice recognition software. Every effort was made to ensure accuracy; however, inadvertent computerized transcription errors may be present.    Murel Wigle T, PA-C 10/19/23 1523    Iva Mariner, MD 10/19/23 [phone]

## 2023-10-19 NOTE — ED Triage Notes (Signed)
 Pt BIB EMS from doc office due to c/o abdominal pain for about a week now; sharp mid to RLQ abd pain, n/v green bile and chills since 3am today; pt nonfebrile. Pt was admitted with bowel obstruction Sunday discharged Tuesday. Pt last BM was this morning; pt is passing gas.

## 2023-10-19 NOTE — ED Provider Notes (Signed)
  Accepted handoff at shift change from Lorin Roemhildt PA-C. Please see prior provider note for more detail.   Briefly: Patient is 58 y.o. "complaining of abdominal pain for about a week. Patient was admitted to the hospital last week and discharged after partial bowel obstruction, treated with medical management. She had follow-up with her primary doctor this morning, she had worsening pain starting around 3 AM this morning with associated nausea and vomiting, as well as chills. Pain is mainly in the mid to right lower quadrant of the abdomen. Last bowel movement was this morning, and she is passing gas."  DDX: concern for AAA, mesenteric ischemia, appendicitis, diverticulitis, DKA, gastroenteritis, nephrolithiasis, pancreatitis, constipation, UTI, bowel obstruction, biliary disease, IBD, PUD, hepatitis   Plan:  - dispo pending UA and CT.  - admitting patient for SBO. 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 Also with recent admission for SBO last week treated non-surgically.  - presents to ED concerned for progressive abdominal pain since last Saturday. Patient started vomiting today. Vomiting has been well controlled in ED since antiemetic medicines were administered. - Physical exam with right sided abdominal tenderness to palpation.  Patient afebrile with stable vitals. - CBC without leukocytosis or anemia.  CMP with mild elevation creatinine at 1.21.  Lipase within normal limits.  UA pending. - CT showing SBO. Patient without vomiting since being in ED and feels better overall. I do not think NG tube is necessary at this time. Starting patient on IV fluids.  - upon sharing results with patient, she stated that her stool samples from last week showed Vibrio Species and she has not taken any medications for this.  - consulted with general surgeon on-call Dr. Lucienne Ryder who agrees to follow patient while admitted. -  Dr. Jannette Mend admitting provider.     .Critical Care  Performed by: Campbell Bureau, PA-C Authorized by: [employer] Bureau, PA-C   Critical care provider statement:    Critical care time (minutes):  30   Critical care was necessary to treat or prevent imminent or life-threatening deterioration of the following conditions: small bowel obstruction.   Critical care was time spent personally by me on the following activities:  Development of treatment plan with patient or surrogate, discussions with consultants, evaluation of patient's response to treatment, examination of patient, ordering and review of laboratory studies, ordering and review of radiographic studies, ordering and performing treatments and interventions, pulse oximetry, re-evaluation of patient's condition and review of old charts   Care discussed with: admitting provider   Comments:     Small bowel obstruction requiring General Surgery Consult and Hospitalist Admission       Don Fritter 10/19/23 1745    Iva Mariner, MD 10/20/23 1517

## 2023-10-20 ENCOUNTER — Inpatient Hospital Stay (HOSPITAL_COMMUNITY)

## 2023-10-20 ENCOUNTER — Telehealth: Payer: Self-pay

## 2023-10-20 DIAGNOSIS — K56609 Unspecified intestinal obstruction, unspecified as to partial versus complete obstruction: Secondary | ICD-10-CM | POA: Diagnosis not present

## 2023-10-20 LAB — CBC
HCT: 32.3 % — ABNORMAL LOW (ref 36.0–46.0)
Hemoglobin: 10.4 g/dL — ABNORMAL LOW (ref 12.0–15.0)
MCH: 28.5 pg (ref 26.0–34.0)
MCHC: 32.2 g/dL (ref 30.0–36.0)
MCV: 88.5 fL (ref 80.0–100.0)
Platelets: 122 10*3/uL — ABNORMAL LOW (ref 150–400)
RBC: 3.65 MIL/uL — ABNORMAL LOW (ref 3.87–5.11)
RDW: 13.3 % (ref 11.5–15.5)
WBC: 2.6 10*3/uL — ABNORMAL LOW (ref 4.0–10.5)
nRBC: 0 % (ref 0.0–0.2)

## 2023-10-20 LAB — BASIC METABOLIC PANEL WITH GFR
Anion gap: 8 (ref 5–15)
BUN: 15 mg/dL (ref 6–20)
CO2: 23 mmol/L (ref 22–32)
Calcium: 8.9 mg/dL (ref 8.9–10.3)
Chloride: 105 mmol/L (ref 98–111)
Creatinine, Ser: 1.11 mg/dL — ABNORMAL HIGH (ref 0.44–1.00)
GFR, Estimated: 58 mL/min — ABNORMAL LOW (ref >60)
Glucose, Bld: 103 mg/dL — ABNORMAL HIGH (ref 70–99)
Potassium: 4 mmol/L (ref 3.5–5.1)
Sodium: 136 mmol/L (ref 135–145)

## 2023-10-20 MED ORDER — LACTATED RINGERS IV SOLN: INTRAVENOUS | Status: AC

## 2023-10-20 MED ORDER — DIATRIZOATE MEGLUMINE & SODIUM 66-10 % PO SOLN
90.0000 mL | Freq: Once | ORAL | Status: AC
  Administered 2023-10-20: 90 mL via ORAL
  Filled 2023-10-20: qty 90

## 2023-10-20 NOTE — Consult Note (Signed)
 MAUDENE ARSENAULT [birth year]  [id number].    Requesting MD: Gaylin Ke, MD  Chief Complaint/Reason for Consult: SBO  HPI: RUSSELL TEANEY is a 58 y.o. 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   Patient was recently seen by group on 4/22 for SBO.  At that time patient was having diarrhea and improvement in her pain.  She was discharged home on 4/22.  She reports after discharge she was notified her GI panel came back positive for vibrio.  She was not treated with antibiotics per her report.  She slowly advance her diet at home to a soft diet and was feeling better, tolerating this and having normal BMs.  However on Saturday, 3 days ago, she began having crampy lower abdominal pain.  She backed her self down to liquids without improvement.  On Sunday she awoke in the early morning with nausea and vomiting.  She was seen by her PCP and directed the ED for evaluation.    Her workup was concerning for SBO with transition point in the distal jejunum RLQ.  No free fluid or free air.  She is afebrile without tachycardia or hypotension.  Noted leukopenia of 2.6K.  Since admission she reports her abdominal pain has greatly improved.  No current abdominal pain.  She does occasionally get waves of sharp lower abdominal pain that will last for a few seconds along with waves of nausea that do not seem to be brought on by anything. Her last episode of emesis was yesterday morning.  No flatus or BM since admission.  Her last BM was yesterday morning, soft/liquidy without hematochezia.  Prior to last week she had never had a bowel obstruction.  She has a history of a prior hysterectomy and laparoscopic cholecystectomy. She has an outpt PET scan pending.   ROS: ROS As above, see hpi  Family History  Problem Relation Age of Onset   Heart disease  Mother    Zoanne Hinders White syndrome Father    Fredrick Jenkins Parkinson White syndrome Paternal Grandmother    Diabetes Paternal Grandmother    Stomach cancer Paternal Grandmother    Breast cancer Neg Hx     Past Medical History:  Diagnosis Date   Allergy    Anxiety    Chronic bronchitis (HCC)    Depression    Headache    Hypoglycemia    Lower back pain    since fall ~ 2012 (05/25/2015)   Migraine    05/25/2015 "probably once/month"   Migraine headache    Pain in left hip    since fall ~ 2012 (05/25/2015)   Pneumonia "several times"    Past Surgical History:  Procedure Laterality Date   ABDOMINAL HYSTERECTOMY  11/2002   "found benign tumors"   BRONCHIAL BIOPSY  04/24/2021   Procedure: BRONCHIAL BIOPSIES;  Surgeon: Prudy Brownie, DO;  Location: [facility];  Service: Pulmonary;;   BRONCHIAL BRUSHINGS  04/24/2021   Procedure: BRONCHIAL BRUSHINGS;  Surgeon: Prudy Brownie, DO;  Location: [facility];  Service: Pulmonary;;   BRONCHIAL NEEDLE ASPIRATION BIOPSY  04/24/2021   Procedure: BRONCHIAL NEEDLE ASPIRATION BIOPSIES;  Surgeon: Prudy Brownie, DO;  Location: [facility];  Service: Pulmonary;;   CHEST TUBE INSERTION  04/24/2021   Procedure: CHEST TUBE INSERTION;  Surgeon: Prudy Brownie, DO;  Location: [facility];  Service:  Pulmonary;;   CHOLECYSTECTOMY N/A 05/25/2015   Procedure: LAPAROSCOPIC CHOLECYSTECTOMY;  Surgeon: Shela Derby, MD;  Location: [facility] OR;  Service: General;  Laterality: N/A;   LAPAROSCOPIC CHOLECYSTECTOMY  05/25/2015   MENISCUS REPAIR Right 03/2017   VIDEO BRONCHOSCOPY WITH ENDOBRONCHIAL NAVIGATION N/A 04/24/2021   Procedure: VIDEO BRONCHOSCOPY WITH ENDOBRONCHIAL NAVIGATION;  Surgeon: Prudy Brownie, DO;  Location: [facility];  Service: Pulmonary;  Laterality: N/A;   VIDEO BRONCHOSCOPY WITH RADIAL ENDOBRONCHIAL ULTRASOUND  04/24/2021   Procedure: VIDEO BRONCHOSCOPY WITH RADIAL ENDOBRONCHIAL ULTRASOUND;  Surgeon: Prudy Brownie, DO;  Location: [facility];  Service: Pulmonary;;    Social History:  reports that she has never smoked. She has never used smokeless tobacco. She reports that she does not drink alcohol and does not use drugs.  Allergies:  Allergies  Allergen Reactions   Celebrex [Celecoxib] Itching and Rash    Facial itching   Minocycline Itching and Rash    Facial itching     Medications Prior to Admission  Medication Sig Dispense Refill   acetaminophen  (TYLENOL ) 500 MG tablet Take 500 mg by mouth every 6 (six) hours as needed for moderate pain or headache.     albuterol  (VENTOLIN  HFA) 108 (90 Base) MCG/ACT inhaler Inhale 2 puffs into the lungs every 4 (four) hours as needed.     ALPRAZolam  (XANAX ) 0.25 MG tablet Take 0.25 mg by mouth every 4 (four) hours as needed for anxiety. for anxiety     aspirin EC 81 MG tablet Take 81 mg by mouth daily. Swallow whole.     benzonatate (TESSALON) 100 MG capsule Take 200 mg by mouth 3 (three) times daily.     citalopram  (CELEXA ) 10 MG tablet Take 10 mg by mouth at bedtime.     Diclofenac Potassium,Migraine, 50 MG PACK Take 1 each by mouth as needed. As needed for migraines     diclofenac sodium (VOLTAREN) 1 % GEL Apply topically as needed. To affected area     diphenhydrAMINE  (BENADRYL ) 25 MG tablet Take 25 mg by mouth See admin instructions. 25 mg at bedtime 25 mg as needed for itching     Docusate Sodium  (STOOL SOFTENER LAXATIVE PO) Take 1 tablet by mouth as needed.     eletriptan (RELPAX) 20 MG tablet Take 20 mg by mouth as needed for headache (at onset of migraine.). May repeat in 2 hours if needed.     fluticasone (FLONASE) 50 MCG/ACT nasal spray Place 2 sprays into both nostrils daily as needed for allergies.     HYDROcodone -acetaminophen  (NORCO/VICODIN) 5-325 MG tablet Take 1 tablet by mouth every 6 (six) hours as needed for moderate pain (pain score 4-6).     hydrOXYzine  (ATARAX /VISTARIL ) 25 MG tablet Take 25 mg by mouth See admin instructions. 25 mg at bedtime 25 mg  during the day as needed for itching/anxiety     loratadine  (CLARITIN ) 10 MG tablet Take 10 mg by mouth daily.     montelukast  (SINGULAIR ) 10 MG tablet Take 10 mg by mouth at bedtime.     nystatin  powder APPLY  POWDER TOPICALLY TO AFFECTED AREA THREE TIMES DAILY (Patient taking differently: Apply 1 Application topically as needed. APPLY  POWDER TOPICALLY TO AFFECTED AREA THREE TIMES DAILY) 60 g 1   ondansetron  (ZOFRAN -ODT) 4 MG disintegrating tablet DISSOLVE 1 TABLET IN MOUTH EVERY 8 HOURS AS NEEDED FOR NAUSEA AND VOMITING 18 tablet 1   osimertinib  mesylate (TAGRISSO ) 80 MG tablet Take 1 tablet (80 mg total) by mouth daily. 30  tablet 3   prochlorperazine  (COMPAZINE ) 10 MG tablet TAKE 1 TABLET BY MOUTH EVERY 6 HOURS AS NEEDED FOR NAUSEA FOR VOMITING 30 tablet 1   solifenacin (VESICARE) 10 MG tablet Take 10 mg by mouth every evening.     triamcinolone cream (KENALOG) 0.1 % Apply 1 Application topically 2 (two) times daily.     zolpidem  (AMBIEN ) 10 MG tablet TAKE 1/2 TO 1 (ONE-HALF TO ONE) TABLET BY MOUTH AT BEDTIME AS NEEDED FOR SLEEP 60 tablet 0     Physical Exam: Blood pressure 111/64, pulse 62, temperature 98.6 F (37 C), resp. rate 18, height 5\' 1"  (1.549 m), weight 116.9 kg, SpO2 96%. General: pleasant, WD/WN female who is laying in bed in NAD HEENT: head is normocephalic, atraumatic.  Sclera are non-icteric. Heart: regular, rate, and rhythm.   Lungs: CTAB, no wheezes, rhonchi, or rales noted.  Respiratory effort nonlabored Abd:  Soft, mild distension, suprapubic ttp without rigidity or guarding and otherwise NT. Hypoactive BS. No masses, hernias, or organomegaly MS: no BUE edema Skin: warm and dry  Psych: A&Ox4 with an appropriate affect Neuro: normal speech, thought process intact, gait not assessed   Results for orders placed or performed during the hospital encounter of 10/19/23 (from the past 48 hours)  Lipase, blood     Status: None   Collection Time: 10/19/23 12:20 PM  Result  Value Ref Range   Lipase 36 11 - 51 U/L    Comment: Performed at [facility], [address] [place], [place] [address]  Comprehensive metabolic panel     Status: Abnormal   Collection Time: 10/19/23 12:20 PM  Result Value Ref Range   Sodium 135 135 - 145 mmol/L   Potassium 4.4 3.5 - 5.1 mmol/L   Chloride 103 98 - 111 mmol/L   CO2 22 22 - 32 mmol/L   Glucose, Bld 147 (H) 70 - 99 mg/dL    Comment: Glucose reference range applies only to samples taken after fasting for at least 8 hours.   BUN 17 6 - 20 mg/dL   Creatinine, Ser 9.56 (H) 0.44 - 1.00 mg/dL   Calcium 9.9 8.9 - 21.3 mg/dL   Total Protein 8.0 6.5 - 8.1 g/dL   Albumin 4.4 3.5 - 5.0 g/dL   AST 22 15 - 41 U/L   ALT 19 0 - 44 U/L   Alkaline Phosphatase 64 38 - 126 U/L   Total Bilirubin 0.9 0.0 - 1.2 mg/dL   GFR, Estimated 53 (L) >60 mL/min    Comment: (NOTE) Calculated using the CKD-EPI Creatinine Equation (2021)    Anion gap 10 5 - 15    Comment: Performed at [facility], [address], [place], [place] [address]  CBC     Status: None   Collection Time: 10/19/23 12:20 PM  Result Value Ref Range   WBC 5.1 4.0 - 10.5 K/uL   RBC 4.80 3.87 - 5.11 MIL/uL   Hemoglobin 13.6 12.0 - 15.0 g/dL   HCT 84.6 96.2 - 95.2 %   MCV 88.1 80.0 - 100.0 fL   MCH 28.3 26.0 - 34.0 pg   MCHC 32.2 30.0 - 36.0 g/dL   RDW 84.1 32.4 - 40.1 %   Platelets 160 150 - 400 K/uL   nRBC 0.0 0.0 - 0.2 %    Comment: Performed at [facility], [address], [place], [place] [address]  Urinalysis, Routine w reflex microscopic -Urine, Clean Catch  Status: Abnormal   Collection Time: 10/19/23  4:54 PM  Result Value Ref Range   Color, Urine YELLOW YELLOW   APPearance CLEAR CLEAR   Specific Gravity, Urine 1.034 (H) 1.005 - 1.030   pH 5.0 5.0 - 8.0   Glucose, UA NEGATIVE NEGATIVE mg/dL   Hgb urine dipstick NEGATIVE NEGATIVE   Bilirubin Urine NEGATIVE NEGATIVE   Ketones, ur NEGATIVE NEGATIVE  mg/dL   Protein, ur NEGATIVE NEGATIVE mg/dL   Nitrite NEGATIVE NEGATIVE   Leukocytes,Ua NEGATIVE NEGATIVE   RBC / HPF 0-5 0 - 5 RBC/hpf   WBC, UA 0-5 0 - 5 WBC/hpf   Bacteria, UA NONE SEEN NONE SEEN   Squamous Epithelial / HPF 0-5 0 - 5 /HPF   Mucus PRESENT     Comment: Performed at [facility], [address], [place], [place] [address]  Basic metabolic panel     Status: Abnormal   Collection Time: 10/20/23  4:44 AM  Result Value Ref Range   Sodium 136 135 - 145 mmol/L   Potassium 4.0 3.5 - 5.1 mmol/L   Chloride 105 98 - 111 mmol/L   CO2 23 22 - 32 mmol/L   Glucose, Bld 103 (H) 70 - 99 mg/dL    Comment: Glucose reference range applies only to samples taken after fasting for at least 8 hours.   BUN 15 6 - 20 mg/dL   Creatinine, Ser 8.46 (H) 0.44 - 1.00 mg/dL   Calcium 8.9 8.9 - 96.2 mg/dL   GFR, Estimated 58 (L) >60 mL/min    Comment: (NOTE) Calculated using the CKD-EPI Creatinine Equation (2021)    Anion gap 8 5 - 15    Comment: Performed at [facility] [address], [place], [place] [address]  CBC     Status: Abnormal   Collection Time: 10/20/23  4:44 AM  Result Value Ref Range   WBC 2.6 (L) 4.0 - 10.5 K/uL   RBC 3.65 (L) 3.87 - 5.11 MIL/uL   Hemoglobin 10.4 (L) 12.0 - 15.0 g/dL   HCT 13.2 (L) 44.0 - 10.2 %   MCV 88.5 80.0 - 100.0 fL   MCH 28.5 26.0 - 34.0 pg   MCHC 32.2 30.0 - 36.0 g/dL   RDW 72.5 36.6 - 44.0 %   Platelets 122 (L) 150 - 400 K/uL   nRBC 0.0 0.0 - 0.2 %    Comment: Performed at [facility], [address] [place], [place] [address]   CT ABDOMEN PELVIS W CONTRAST Result Date: 10/19/2023 CLINICAL DATA:  Right lower quadrant abdominal pain for 1 week, nausea and vomiting, chills, metastatic non-small cell lung cancer EXAM: CT ABDOMEN AND PELVIS WITH CONTRAST TECHNIQUE: Multidetector CT imaging of the abdomen and pelvis was performed using the standard protocol following bolus administration of  intravenous contrast. RADIATION DOSE REDUCTION: This exam was performed according to the departmental dose-optimization program which includes automated exposure control, adjustment of the mA and/or kV according to patient size and/or use of iterative reconstruction technique. CONTRAST:  OMNIPAQUE  IOHEXOL  300 MG/ML  SOLN COMPARISON:  10/16/2023, 10/12/2023 FINDINGS: Lower chest: No acute pleural or parenchymal lung disease. Hepatobiliary: No focal liver abnormality is seen. Status post cholecystectomy. No biliary dilatation. Pancreas: Unremarkable. No pancreatic ductal dilatation or surrounding inflammatory changes. Spleen: Normal in size without focal abnormality. Adrenals/Urinary Tract: Kidneys enhance normally and symmetrically. No urinary tract calculi or obstruction. The adrenals and bladder are unremarkable. Stomach/Bowel: Moderate dilation of the distal small bowel,  measuring up to 3.6 cm in diameter, consistent with small bowel obstruction. There is an abrupt transition point within the distal jejunum right lower quadrant reference image 63/2. Colon is decompressed. Normal appendix right lower quadrant. No bowel wall thickening or inflammatory change. Vascular/Lymphatic: Aortic atherosclerosis. No enlarged abdominal or pelvic lymph nodes. Reproductive: Status post hysterectomy. No adnexal masses. Other: No free fluid or free intraperitoneal gas. No abdominal wall hernia. Musculoskeletal: No acute or destructive bony abnormalities. Reconstructed images demonstrate no additional findings. IMPRESSION: 1. Small-bowel obstruction, with transition point within the distal jejunum right lower quadrant as above. 2.  Aortic Atherosclerosis (ICD10-I70.0). Electronically Signed   By: Bobbye Burrow M.D.   On: 10/19/2023 15:38    Anti-infectives (From admission, onward)    None       Assessment/Plan SBO - CT w/ transition point in the distal jejunum RLQ.  No free fluid or free air.  Hx of prior  hysterectomy and cholecystectomy. 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  - Recent hx of Vibrio + GI panel.  - HDS without fever, tachycardia or hypotension. No peritonitis on exam. WBC non-elevated. No current indication for emergency surgery - Can hold off on NGT given no current n/v - if any further vomiting, recommend NGT - Start SBO protocol orally - Keep K >=4, Phos >= 3, Mg >= 2 and mobilize for bowel function - Hopefully patient will improve with conservative management. If patient fails to improve with conservative management, they may require exploratory surgery during admission - Agree with medical admission. We will follow with you.   FEN - NPO as above, IVF per [facility] VTE - SCDs, okay for chem ppx from a general surgery standpoint ID - None  I reviewed nursing notes, ED provider notes, hospitalist notes, last 24 h vitals and pain scores, last 48 h intake and output, last 24 h labs and trends, and last 24 h imaging results.  Delton Filbert, [facility] 10/20/2023, 8:43 AM Please see Amion for pager number during day hours 7:00am-4:30pm

## 2023-10-20 NOTE — Plan of Care (Signed)
   Problem: Clinical Measurements: Goal: Will remain free from infection Outcome: Progressing Goal: Diagnostic test results will improve Outcome: Progressing

## 2023-10-20 NOTE — Progress Notes (Signed)
 Abigail Golden   DOB:10-11-1965   YN#:[id number]   QMV#:[id number]  Med/onc follow up note   Subjective: Patient is a myelotomy, on the last year for her metastatic non-small cell lung cancer.  She was admitted yesterday for recurrent small bowel obstruction.  She was originally admitted for small bowel obstruction from April 20 to April 22.  Her Tagrisso  has been held since hospital admission.   Objective:  Vitals:   10/20/23 0951 10/20/23 1340  BP: (!) 136/57 133/65  Pulse: 64 60  Resp: 18 18  Temp:  98.2 F (36.8 C)  SpO2: 96% 96%    Body mass index is 48.7 kg/m.  Intake/Output Summary (Last 24 hours) at 10/20/2023 2106 Last data filed at 10/20/2023 1800 Gross per 24 hour  Intake 1695.55 ml  Output 950 ml  Net 745.55 ml     Sclerae unicteric  Oropharynx clear  No peripheral adenopathy  Lungs clear -- no rales or rhonchi  Heart regular rate and rhythm  Abdomen soft, mild diffuse tenderness.  MSK no focal spinal tenderness, no peripheral edema  Neuro nonfocal   CBG (last 3)  No results for input(s): "GLUCAP" in the last 72 hours.   Labs:  Lab Results  Component Value Date   WBC 2.6 (L) 10/20/2023   HGB 10.4 (L) 10/20/2023   HCT 32.3 (L) 10/20/2023   MCV 88.5 10/20/2023   PLT 122 (L) 10/20/2023   NEUTROABS 2.8 09/08/2023    Urine Studies No results for input(s): "UHGB", "CRYS" in the last 72 hours.  Invalid input(s): "UACOL", "UAPR", "USPG", "UPH", "UTP", "UGL", "UKET", "UBIL", "UNIT", "UROB", "ULEU", "UEPI", "UWBC", "URBC", "UBAC", "CAST", "UCOM", "BILUA"  Basic Metabolic Panel: Recent Labs  Lab 10/19/23 1220 10/20/23 0444  NA 135 136  K 4.4 4.0  CL 103 105  CO2 22 23  GLUCOSE 147* 103*  BUN 17 15  CREATININE 1.21* 1.11*  CALCIUM 9.9 8.9   GFR Estimated Creatinine Clearance: 67.4 mL/min (A) (by C-G formula based on SCr of 1.11 mg/dL (H)). Liver Function Tests: Recent Labs  Lab 10/19/23 1220  AST 22  ALT 19  ALKPHOS 64  BILITOT 0.9  PROT  8.0  ALBUMIN 4.4   Recent Labs  Lab 10/19/23 1220  LIPASE 36   No results for input(s): "AMMONIA" in the last 168 hours. Coagulation profile No results for input(s): "INR", "PROTIME" in the last 168 hours.  CBC: Recent Labs  Lab 10/19/23 1220 10/20/23 0444  WBC 5.1 2.6*  HGB 13.6 10.4*  HCT 42.3 32.3*  MCV 88.1 88.5  PLT 160 122*   Cardiac Enzymes: No results for input(s): "CKTOTAL", "CKMB", "CKMBINDEX", "TROPONINI" in the last 168 hours. BNP: Invalid input(s): "POCBNP" CBG: Recent Labs  Lab 10/16/23 1241  GLUCAP 91   D-Dimer No results for input(s): "DDIMER" in the last 72 hours. Hgb A1c No results for input(s): "HGBA1C" in the last 72 hours. Lipid Profile No results for input(s): "CHOL", "HDL", "LDLCALC", "TRIG", "CHOLHDL", "LDLDIRECT" in the last 72 hours. Thyroid function studies No results for input(s): "TSH", "T4TOTAL", "T3FREE", "THYROIDAB" in the last 72 hours.  Invalid input(s): "FREET3" Anemia work up No results for input(s): "VITAMINB12", "FOLATE", "FERRITIN", "TIBC", "IRON", "RETICCTPCT" in the last 72 hours. Microbiology Recent Results (from the past 240 hours)  C Difficile Quick Screen w PCR reflex     Status: None   Collection Time: 10/11/23 11:13 PM   Specimen: STOOL  Result Value Ref Range Status   C Diff antigen NEGATIVE NEGATIVE  Final   C Diff toxin NEGATIVE NEGATIVE Final   C Diff interpretation No C. difficile detected.  Final    Comment: Performed at [facility], [address] [place], [place] [address]  Gastrointestinal Panel by PCR , Stool     Status: Abnormal   Collection Time: 10/13/23  5:47 AM   Specimen: Rectum; Stool  Result Value Ref Range Status   Campylobacter species NOT DETECTED NOT DETECTED Final   Plesimonas shigelloides NOT DETECTED NOT DETECTED Final   Salmonella species NOT DETECTED NOT DETECTED Final   Yersinia enterocolitica NOT DETECTED NOT DETECTED Final   Vibrio species DETECTED (A) NOT  DETECTED Final    Comment: RESULT CALLED TO, READ BACK BY AND VERIFIED WITH: STEPHANIE WRENN RN @0320  10/14/23 ASW    Vibrio cholerae NOT DETECTED NOT DETECTED Final   Enteroaggregative E coli (EAEC) NOT DETECTED NOT DETECTED Final   Enteropathogenic E coli (EPEC) NOT DETECTED NOT DETECTED Final   Enterotoxigenic E coli (ETEC) NOT DETECTED NOT DETECTED Final   Shiga like toxin producing E coli (STEC) NOT DETECTED NOT DETECTED Final   Shigella/Enteroinvasive E coli (EIEC) NOT DETECTED NOT DETECTED Final   Cryptosporidium NOT DETECTED NOT DETECTED Final   Cyclospora cayetanensis NOT DETECTED NOT DETECTED Final   Entamoeba histolytica NOT DETECTED NOT DETECTED Final   Giardia lamblia NOT DETECTED NOT DETECTED Final   Adenovirus F40/41 NOT DETECTED NOT DETECTED Final   Astrovirus NOT DETECTED NOT DETECTED Final   Norovirus GI/GII NOT DETECTED NOT DETECTED Final   Rotavirus A NOT DETECTED NOT DETECTED Final   Sapovirus (I, II, IV, and V) NOT DETECTED NOT DETECTED Final    Comment: Performed at [facility], [address] [place], [place] [address]      Studies:  DG Abd Portable 1V-Small Bowel Obstruction Protocol-initial, 8 hr delay Result Date: 10/20/2023 CLINICAL DATA:  Right lower quadrant abdominal pain EXAM: PORTABLE ABDOMEN - 1 VIEW COMPARISON:  None Available. FINDINGS: Administered oral contrast opacifies a nondistended colon rectal vault. Normal abdominal gas pattern. No gross free intraperitoneal gas. Subdiaphragmatic region is excluded view. No nephro or urolithiasis. No acut bone abnormality e IMPRESSION: 1. Nonobstructive bowel gas pattern. Electronically Signed   By: Worthy Heads M.D.   On: 10/20/2023 19:25   CT ABDOMEN PELVIS W CONTRAST Result Date: 10/19/2023 CLINICAL DATA:  Right lower quadrant abdominal pain for 1 week, nausea and vomiting, chills, metastatic non-small cell lung cancer EXAM: CT ABDOMEN AND PELVIS WITH CONTRAST TECHNIQUE: Multidetector CT imaging  of the abdomen and pelvis was performed using the standard protocol following bolus administration of intravenous contrast. RADIATION DOSE REDUCTION: This exam was performed according to the departmental dose-optimization program which includes automated exposure control, adjustment of the mA and/or kV according to patient size and/or use of iterative reconstruction technique. CONTRAST:  OMNIPAQUE  IOHEXOL  300 MG/ML  SOLN COMPARISON:  10/16/2023, 10/12/2023 FINDINGS: Lower chest: No acute pleural or parenchymal lung disease. Hepatobiliary: No focal liver abnormality is seen. Status post cholecystectomy. No biliary dilatation. Pancreas: Unremarkable. No pancreatic ductal dilatation or surrounding inflammatory changes. Spleen: Normal in size without focal abnormality. Adrenals/Urinary Tract: Kidneys enhance normally and symmetrically. No urinary tract calculi or obstruction. The adrenals and bladder are unremarkable. Stomach/Bowel: Moderate dilation of the distal small bowel, measuring up to 3.6 cm in diameter, consistent with small bowel obstruction. There is an abrupt transition point within the distal jejunum right lower quadrant reference image 63/2. Colon is decompressed. Normal appendix right lower  quadrant. No bowel wall thickening or inflammatory change. Vascular/Lymphatic: Aortic atherosclerosis. No enlarged abdominal or pelvic lymph nodes. Reproductive: Status post hysterectomy. No adnexal masses. Other: No free fluid or free intraperitoneal gas. No abdominal wall hernia. Musculoskeletal: No acute or destructive bony abnormalities. Reconstructed images demonstrate no additional findings. IMPRESSION: 1. Small-bowel obstruction, with transition point within the distal jejunum right lower quadrant as above. 2.  Aortic Atherosclerosis (ICD10-I70.0). Electronically Signed   By: Bobbye Burrow M.D.   On: 10/19/2023 15:38    Assessment: 58 y.o. female   Recurrent small bowel obstruction, likely related to  her previously abdominal surgery. Enteritis from vibrio infection  Metastatic non-small cell lung cancer, on Tagrisso   CKD, stage III Anxiety Obesity  Plan:  - I agreed to hold Tagrisso  when she is NPO, OK to restart when her SBO resolves  -SBO management per primary and surgical team - She had a restaging PET scan from October 16, 2023, it has not been read by radiologist.  I personally reviewed, which showed stable disease, minimally FDG avid. - I will follow-up with her as needed before discharge.   Sonja [place], MD 10/20/2023

## 2023-10-20 NOTE — Telephone Encounter (Signed)
 Pt called stating she's currently admitted w/in the hospital and will not be d/c home today nor tomorrow.  Pt stated she has an appt with Dr. Maryalice Smaller on Thursday, 10/22/2023.  Notified Dr. Maryalice Smaller and her Team and requested if Scheduling would cancel pt's appt on 10/22/2023.

## 2023-10-20 NOTE — Progress Notes (Signed)
 PROGRESS NOTE  Abigail Golden ZOX:[id number] DOB: April 08, 1966 DOA: 10/19/2023 PCP: Abigail Golden., PA-C  HPI/Recap of past 24 hours: Abigail Golden is a 58 y.o. 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 During that hospital stay, stool studies were done which were positive for vibrio species (patient was contacted after discharge, but stated her stools were improved). Presented to her doctor's office, and was then transferred to the ER where workup showed SBO. General surgery consulted.  Patient admitted for further management.    Today, patient still complaining of abdominal tenderness, denies any further nausea/vomiting.  Having BMs.   Assessment/Plan: Principal Problem:   SBO (small bowel obstruction) (HCC)  SBO Noted transition point in the distal jejunum CT abdomen/pelvis showed above General Surgery consulted, appreciate recs N.p.o., IV fluids Pain management, antiemetics   Vibrio infection Noted positive from stool sample 4/22 Still with ongoing diarrhea, repeat C. 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   Weight as of  this encounter: 116.9 kg.     Code Status: Full  Family Communication: None at bedside  Disposition Plan: Status is: Inpatient Remains inpatient appropriate because: Level of care      Consultants: General Surgery  Procedures: None  Antimicrobials: None  DVT prophylaxis: Lovenox    Objective: Vitals:   10/20/23 0157 10/20/23 0538 10/20/23 0951 10/20/23 1340  BP: 122/62 111/64 (!) 136/57 133/65  Pulse: 66 62 64 60  Resp: 16 18 18 18   Temp: 97.9 F (36.6 C) 98.6 F (37 C)  98.2 F (36.8 C)  TempSrc:    Oral  SpO2: 96% 96% 96% 96%  Weight:      Height:        Intake/Output Summary (Last 24 hours) at 10/20/2023 1519 Last data filed at 10/20/2023 1000 Gross per 24 hour  Intake 2155.39 ml  Output 950 ml  Net 1205.39 ml   Filed Weights   10/19/23 1805  Weight: 116.9 kg    Exam: General: NAD  Cardiovascular: S1, S2 present Respiratory: CTAB Abdomen: Soft, tender, nondistended, bowel sounds present Musculoskeletal: No bilateral pedal edema noted Skin: Normal Psychiatry: Normal mood     Data Reviewed: CBC: Recent Labs  Lab 10/19/23 1220 10/20/23 0444  WBC 5.1 2.6*  HGB 13.6 10.4*  HCT 42.3 32.3*  MCV 88.1 88.5  PLT 160 122*   Basic Metabolic Panel: Recent Labs  Lab 10/19/23 1220 10/20/23 0444  NA 135 136  K 4.4 4.0  CL 103 105  CO2 22 23  GLUCOSE 147* 103*  BUN 17 15  CREATININE 1.21* 1.11*  CALCIUM 9.9 8.9   GFR: Estimated Creatinine Clearance: 67.4 mL/min (A) (by C-G formula based  on SCr of 1.11 mg/dL (H)). Liver Function Tests: Recent Labs  Lab 10/19/23 1220  AST 22  ALT 19  ALKPHOS 64  BILITOT 0.9  PROT 8.0  ALBUMIN 4.4   Recent Labs  Lab 10/19/23 1220  LIPASE 36   No results for input(s): "AMMONIA" in the last 168 hours. Coagulation Profile: No results for input(s): "INR", "PROTIME" in the last 168 hours. Cardiac Enzymes: No results for input(s): "CKTOTAL", "CKMB", "CKMBINDEX", "TROPONINI" in the last 168  hours. BNP (last 3 results) No results for input(s): "PROBNP" in the last 8760 hours. HbA1C: No results for input(s): "HGBA1C" in the last 72 hours. CBG: Recent Labs  Lab 10/16/23 1241  GLUCAP 91   Lipid Profile: No results for input(s): "CHOL", "HDL", "LDLCALC", "TRIG", "CHOLHDL", "LDLDIRECT" in the last 72 hours. Thyroid Function Tests: No results for input(s): "TSH", "T4TOTAL", "FREET4", "T3FREE", "THYROIDAB" in the last 72 hours. Anemia Panel: No results for input(s): "VITAMINB12", "FOLATE", "FERRITIN", "TIBC", "IRON", "RETICCTPCT" in the last 72 hours. Urine analysis:    Component Value Date/Time   COLORURINE YELLOW 10/19/2023 1654   APPEARANCEUR CLEAR 10/19/2023 1654   LABSPEC 1.034 (H) 10/19/2023 1654   PHURINE 5.0 10/19/2023 1654   GLUCOSEU NEGATIVE 10/19/2023 1654   HGBUR NEGATIVE 10/19/2023 1654   BILIRUBINUR NEGATIVE 10/19/2023 1654   KETONESUR NEGATIVE 10/19/2023 1654   PROTEINUR NEGATIVE 10/19/2023 1654   NITRITE NEGATIVE 10/19/2023 1654   LEUKOCYTESUR NEGATIVE 10/19/2023 1654   Sepsis Labs: @LABRCNTIP (procalcitonin:4,lacticidven:4)  ) Recent Results (from the past 240 hours)  C Difficile Quick Screen w PCR reflex     Status: None   Collection Time: 10/11/23 11:13 PM   Specimen: STOOL  Result Value Ref Range Status   C Diff antigen NEGATIVE NEGATIVE Final   C Diff toxin NEGATIVE NEGATIVE Final   C Diff interpretation No C. difficile detected.  Final    Comment: Performed at [facility], [address], [place], [place] [address]  Gastrointestinal Panel by PCR , Stool     Status: Abnormal   Collection Time: 10/13/23  5:47 AM   Specimen: Rectum; Stool  Result Value Ref Range Status   Campylobacter species NOT DETECTED NOT DETECTED Final   Plesimonas shigelloides NOT DETECTED NOT DETECTED Final   Salmonella species NOT DETECTED NOT DETECTED Final   Yersinia enterocolitica NOT DETECTED NOT DETECTED Final   Vibrio species DETECTED (A)  NOT DETECTED Final    Comment: RESULT CALLED TO, READ BACK BY AND VERIFIED WITH: STEPHANIE WRENN RN @0320  10/14/23 ASW    Vibrio cholerae NOT DETECTED NOT DETECTED Final   Enteroaggregative E coli (EAEC) NOT DETECTED NOT DETECTED Final   Enteropathogenic E coli (EPEC) NOT DETECTED NOT DETECTED Final   Enterotoxigenic E coli (ETEC) NOT DETECTED NOT DETECTED Final   Shiga like toxin producing E coli (STEC) NOT DETECTED NOT DETECTED Final   Shigella/Enteroinvasive E coli (EIEC) NOT DETECTED NOT DETECTED Final   Cryptosporidium NOT DETECTED NOT DETECTED Final   Cyclospora cayetanensis NOT DETECTED NOT DETECTED Final   Entamoeba histolytica NOT DETECTED NOT DETECTED Final   Giardia lamblia NOT DETECTED NOT DETECTED Final   Adenovirus F40/41 NOT DETECTED NOT DETECTED Final   Astrovirus NOT DETECTED NOT DETECTED Final   Norovirus GI/GII NOT DETECTED NOT DETECTED Final   Rotavirus A NOT DETECTED NOT DETECTED Final   Sapovirus (I, II, IV, and V) NOT DETECTED NOT DETECTED Final    Comment: Performed at [facility], [address] [place], [place]  [address]      Studies: No results found.  Scheduled Meds:  enoxaparin  (LOVENOX ) injection  60 mg Subcutaneous Q24H    Continuous Infusions:  lactated ringers        LOS: 1 day     Abigail Gordon, MD [facility]  If 7PM-7AM, please contact night-coverage [email or web address] 10/20/2023, 3:19 PM

## 2023-10-20 NOTE — Progress Notes (Signed)
   10/20/23 1022  TOC Brief Assessment  Insurance and Status Reviewed  Patient has primary care physician Yes  Home environment has been reviewed lives alone  Prior level of function: independent  Prior/Current Home Services No current home services  Social Drivers of Health Review SDOH reviewed no interventions necessary  Readmission risk has been reviewed Yes  Transition of care needs no transition of care needs at this time

## 2023-10-21 DIAGNOSIS — K56609 Unspecified intestinal obstruction, unspecified as to partial versus complete obstruction: Secondary | ICD-10-CM | POA: Diagnosis not present

## 2023-10-21 LAB — CBC WITH DIFFERENTIAL/PLATELET
Abs Immature Granulocytes: 0 10*3/uL (ref 0.00–0.07)
Basophils Absolute: 0 10*3/uL (ref 0.0–0.1)
Basophils Relative: 0 %
Eosinophils Absolute: 0 10*3/uL (ref 0.0–0.5)
Eosinophils Relative: 0 %
HCT: 30.9 % — ABNORMAL LOW (ref 36.0–46.0)
Hemoglobin: 10.1 g/dL — ABNORMAL LOW (ref 12.0–15.0)
Immature Granulocytes: 0 %
Lymphocytes Relative: 36 %
Lymphs Abs: 0.9 10*3/uL (ref 0.7–4.0)
MCH: 28.5 pg (ref 26.0–34.0)
MCHC: 32.7 g/dL (ref 30.0–36.0)
MCV: 87.3 fL (ref 80.0–100.0)
Monocytes Absolute: 0.2 10*3/uL (ref 0.1–1.0)
Monocytes Relative: 9 %
Neutro Abs: 1.4 10*3/uL — ABNORMAL LOW (ref 1.7–7.7)
Neutrophils Relative %: 55 %
Platelets: 105 10*3/uL — ABNORMAL LOW (ref 150–400)
RBC: 3.54 MIL/uL — ABNORMAL LOW (ref 3.87–5.11)
RDW: 13.4 % (ref 11.5–15.5)
WBC: 2.5 10*3/uL — ABNORMAL LOW (ref 4.0–10.5)
nRBC: 0 % (ref 0.0–0.2)

## 2023-10-21 LAB — BASIC METABOLIC PANEL WITH GFR
Anion gap: 7 (ref 5–15)
BUN: 13 mg/dL (ref 6–20)
CO2: 23 mmol/L (ref 22–32)
Calcium: 8.7 mg/dL — ABNORMAL LOW (ref 8.9–10.3)
Chloride: 105 mmol/L (ref 98–111)
Creatinine, Ser: 1.24 mg/dL — ABNORMAL HIGH (ref 0.44–1.00)
GFR, Estimated: 51 mL/min — ABNORMAL LOW (ref >60)
Glucose, Bld: 89 mg/dL (ref 70–99)
Potassium: 3.8 mmol/L (ref 3.5–5.1)
Sodium: 135 mmol/L (ref 135–145)

## 2023-10-21 LAB — GASTROINTESTINAL PANEL BY PCR, STOOL (REPLACES STOOL CULTURE)

## 2023-10-21 NOTE — Plan of Care (Signed)
  Problem: Clinical Measurements: Goal: Will remain free from infection Outcome: Progressing   Problem: Clinical Measurements: Goal: Diagnostic test results will improve Outcome: Progressing   Problem: Activity: Goal: Risk for activity intolerance will decrease Outcome: Completed/Met

## 2023-10-21 NOTE — Progress Notes (Signed)
 Subjective/Chief Complaint: Reports headache this morning Denies abdominal pain Having diarrhea   Objective: Vital signs in last 24 hours: Temp:  [98.1 F (36.7 C)-98.6 F (37 C)] 98.1 F (36.7 C) (04/30 0630) Pulse Rate:  [60-72] 65 (04/30 0630) Resp:  [15-18] 15 (04/30 0630) BP: (125-137)/(57-69) 125/69 (04/30 0630) SpO2:  [95 %-96 %] 96 % (04/30 0630) Last BM Date : 10/21/23  Intake/Output from previous day: 04/29 0701 - 04/30 0700 In: 1575.3 [P.O.:180; I.V.:1395.3] Out: 150 [Urine:150] Intake/Output this shift: No intake/output data recorded.  Exam: Awake and alert Abdomen soft, non-tender, nondistended  Lab Results:  Recent Labs    10/20/23 0444 10/21/23 0450  WBC 2.6* 2.5*  HGB 10.4* 10.1*  HCT 32.3* 30.9*  PLT 122* 105*   BMET Recent Labs    10/20/23 0444 10/21/23 0450  NA 136 135  K 4.0 3.8  CL 105 105  CO2 23 23  GLUCOSE 103* 89  BUN 15 13  CREATININE 1.11* 1.24*  CALCIUM 8.9 8.7*   PT/INR No results for input(s): "LABPROT", "INR" in the last 72 hours. ABG No results for input(s): "PHART", "HCO3" in the last 72 hours.  Invalid input(s): "PCO2", "PO2"  Studies/Results: DG Abd Portable 1V-Small Bowel Obstruction Protocol-initial, 8 hr delay Result Date: 10/20/2023 CLINICAL DATA:  Right lower quadrant abdominal pain EXAM: PORTABLE ABDOMEN - 1 VIEW COMPARISON:  None Available. FINDINGS: Administered oral contrast opacifies a nondistended colon rectal vault. Normal abdominal gas pattern. No gross free intraperitoneal gas. Subdiaphragmatic region is excluded view. No nephro or urolithiasis. No acut bone abnormality e IMPRESSION: 1. Nonobstructive bowel gas pattern. Electronically Signed   By: Worthy Heads M.D.   On: 10/20/2023 19:25   CT ABDOMEN PELVIS W CONTRAST Result Date: 10/19/2023 CLINICAL DATA:  Right lower quadrant abdominal pain for 1 week, nausea and vomiting, chills, metastatic non-small cell lung cancer EXAM: CT ABDOMEN AND  PELVIS WITH CONTRAST TECHNIQUE: Multidetector CT imaging of the abdomen and pelvis was performed using the standard protocol following bolus administration of intravenous contrast. RADIATION DOSE REDUCTION: This exam was performed according to the departmental dose-optimization program which includes automated exposure control, adjustment of the mA and/or kV according to patient size and/or use of iterative reconstruction technique. CONTRAST:  OMNIPAQUE  IOHEXOL  300 MG/ML  SOLN COMPARISON:  10/16/2023, 10/12/2023 FINDINGS: Lower chest: No acute pleural or parenchymal lung disease. Hepatobiliary: No focal liver abnormality is seen. Status post cholecystectomy. No biliary dilatation. Pancreas: Unremarkable. No pancreatic ductal dilatation or surrounding inflammatory changes. Spleen: Normal in size without focal abnormality. Adrenals/Urinary Tract: Kidneys enhance normally and symmetrically. No urinary tract calculi or obstruction. The adrenals and bladder are unremarkable. Stomach/Bowel: Moderate dilation of the distal small bowel, measuring up to 3.6 cm in diameter, consistent with small bowel obstruction. There is an abrupt transition point within the distal jejunum right lower quadrant reference image 63/2. Colon is decompressed. Normal appendix right lower quadrant. No bowel wall thickening or inflammatory change. Vascular/Lymphatic: Aortic atherosclerosis. No enlarged abdominal or pelvic lymph nodes. Reproductive: Status post hysterectomy. No adnexal masses. Other: No free fluid or free intraperitoneal gas. No abdominal wall hernia. Musculoskeletal: No acute or destructive bony abnormalities. Reconstructed images demonstrate no additional findings. IMPRESSION: 1. Small-bowel obstruction, with transition point within the distal jejunum right lower quadrant as above. 2.  Aortic Atherosclerosis (ICD10-I70.0). Electronically Signed   By: Bobbye Burrow M.D.   On: 10/19/2023 15:38     Anti-infectives: Anti-infectives (From admission, onward)    None  Assessment/Plan: SBO vs enteritis  -small bowel protocol films shows all the contrast is in the colon and there is no dilated bowel. -will advance diet to full liquids -would consider a diagnostic laparoscopy or repeat imaging if she again develops obstructive symptoms  Moderated medical decision making  LOS: 2 days    Oza Blumenthal 10/21/2023

## 2023-10-21 NOTE — Progress Notes (Signed)
 PROGRESS NOTE    Abigail Golden  ZOX:[id number] DOB: 04/29/1966 DOA: 10/19/2023 PCP: [name], PA-C    Brief Narrative:  58 y.o. 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 During that hospital stay, stool studies were done which were positive for vibrio species (patient was contacted after discharge, but stated her stools were improved). Presented to her doctor's office, and was then transferred to the ER where workup showed SBO. General surgery consulted.  Patient admitted for further management.   Subjective: Patient seen and examined.  6 or 7 small frequency loose bowel movements since last 24 hours.  Denies any nausea vomiting.  Had some rumbling in her abdomen but denies any excessive pain. Taking ice chips.  Assessment & Plan:   Partial small bowel obstruction likely secondary to recent gastroenteritis from vibrio: Presented with abdominal discomfort.  Noted to have transition point in the distal jejunum.  N.p.o., IV fluids, and adequate pain management, antiemetics.  Followed by surgery. Has adequate bowel functions.   Start clears.  If tolerated, will advance to full liquid diet today. Avoid Imodium for management.  Recent vibrio infection: On a stool sample from 4/22.  This may be responsible for all current issues. Symptomatic management day.  Gradually improving.  C. difficile negative.  Repeat GI pathogen panel pending.  Conservative management.  CKD stage IIIb: At about baseline.  Monitor.  Check electrolytes, magnesium and phosphorus with morning labs.  Anxiety: On Xanax .  Stage IV lung cancer with brain  mets: Followed by Dr. Maryalice Smaller.  Tagrisso  on hold.   DVT prophylaxis: Lovenox  subcu   Code Status: Full code Family Communication: None at the bedside Disposition Plan: Status is: Inpatient Remains inpatient appropriate because: Ongoing bowel management     Consultants:  General Surgery Oncology  Procedures:  None  Antimicrobials:  None     Objective: Vitals:   10/20/23 0951 10/20/23 1340 10/20/23 2158 10/21/23 0630  BP: (!) 136/57 133/65 137/64 125/69  Pulse: 64 60 72 65  Resp: 18 18 16 15   Temp:  98.2 F (36.8 C) 98.6 F (37 C) 98.1 F (36.7 C)  TempSrc:  Oral Oral Oral  SpO2: 96% 96% 95% 96%  Weight:      Height:        Intake/Output Summary (Last 24 hours) at 10/21/2023 0816 Last data filed at 10/21/2023 0600 Gross per 24 hour  Intake 1575.28 ml  Output 150 ml  Net 1425.28 ml   Filed Weights   10/19/23 1805  Weight: 116.9 kg    Examination:  General: Looks fairly comfortable.  Not in distress. Cardiovascular: 1 S2 normal.  Regular rate rhythm. Respiratory: Bilateral clear.  No added sounds. Gastrointestinal: Soft.  Nontender.  Bowel sounds present and hyperactive. Ext: No swelling or edema. Neuro: Alert awake oriented. Musculoskeletal: No deformities. Skin: Intact.      Data Reviewed: I have personally reviewed following labs and imaging studies  CBC: Recent Labs  Lab 10/19/23 1220 10/20/23 0444 10/21/23 0450  WBC 5.1 2.6* 2.5*  NEUTROABS  --   --  1.4*  HGB 13.6 10.4* 10.1*  HCT 42.3 32.3* 30.9*  MCV 88.1 88.5 87.3  PLT 160 122* 105*  Basic Metabolic Panel: Recent Labs  Lab 10/19/23 1220 10/20/23 0444 10/21/23 0450  NA 135 136 135  K 4.4 4.0 3.8  CL 103 105 105  CO2 22 23 23   GLUCOSE 147* 103* 89  BUN 17 15 13   CREATININE 1.21* 1.11* 1.24*  CALCIUM 9.9 8.9 8.7*   GFR: Estimated Creatinine Clearance: 60.3 mL/min (A) (by C-G formula based on SCr of 1.24 mg/dL (H)). Liver Function Tests: Recent Labs  Lab  10/19/23 1220  AST 22  ALT 19  ALKPHOS 64  BILITOT 0.9  PROT 8.0  ALBUMIN 4.4   Recent Labs  Lab 10/19/23 1220  LIPASE 36   No results for input(s): "AMMONIA" in the last 168 hours. Coagulation Profile: No results for input(s): "INR", "PROTIME" in the last 168 hours. Cardiac Enzymes: No results for input(s): "CKTOTAL", "CKMB", "CKMBINDEX", "TROPONINI" in the last 168 hours. BNP (last 3 results) No results for input(s): "PROBNP" in the last 8760 hours. HbA1C: No results for input(s): "HGBA1C" in the last 72 hours. CBG: Recent Labs  Lab 10/16/23 1241  GLUCAP 91   Lipid Profile: No results for input(s): "CHOL", "HDL", "LDLCALC", "TRIG", "CHOLHDL", "LDLDIRECT" in the last 72 hours. Thyroid Function Tests: No results for input(s): "TSH", "T4TOTAL", "FREET4", "T3FREE", "THYROIDAB" in the last 72 hours. Anemia Panel: No results for input(s): "VITAMINB12", "FOLATE", "FERRITIN", "TIBC", "IRON", "RETICCTPCT" in the last 72 hours. Sepsis Labs: No results for input(s): "PROCALCITON", "LATICACIDVEN" in the last 168 hours.  Recent Results (from the past 240 hours)  C Difficile Quick Screen w PCR reflex     Status: None   Collection Time: 10/11/23 11:13 PM   Specimen: STOOL  Result Value Ref Range Status   C Diff antigen NEGATIVE NEGATIVE Final   C Diff toxin NEGATIVE NEGATIVE Final   C Diff interpretation No C. difficile detected.  Final    Comment: Performed at [facility], [address] [place], [place] [address]  Gastrointestinal Panel by PCR , Stool     Status: Abnormal   Collection Time: 10/13/23  5:47 AM   Specimen: Rectum; Stool  Result Value Ref Range Status   Campylobacter species NOT DETECTED NOT DETECTED Final   Plesimonas shigelloides NOT DETECTED NOT DETECTED Final   Salmonella species NOT DETECTED NOT DETECTED Final   Yersinia enterocolitica NOT DETECTED NOT DETECTED Final   Vibrio species DETECTED (A) NOT DETECTED Final    Comment: RESULT  CALLED TO, READ BACK BY AND VERIFIED WITH: STEPHANIE WRENN RN @0320  10/14/23 ASW    Vibrio cholerae NOT DETECTED NOT DETECTED Final   Enteroaggregative E coli (EAEC) NOT DETECTED NOT DETECTED Final   Enteropathogenic E coli (EPEC) NOT DETECTED NOT DETECTED Final   Enterotoxigenic E coli (ETEC) NOT DETECTED NOT DETECTED Final   Shiga like toxin producing E coli (STEC) NOT DETECTED NOT DETECTED Final   Shigella/Enteroinvasive E coli (EIEC) NOT DETECTED NOT DETECTED Final   Cryptosporidium NOT DETECTED NOT DETECTED Final   Cyclospora cayetanensis NOT DETECTED NOT DETECTED Final   Entamoeba histolytica NOT DETECTED NOT DETECTED Final   Giardia lamblia NOT DETECTED NOT DETECTED Final   Adenovirus F40/41 NOT DETECTED NOT DETECTED Final   Astrovirus NOT DETECTED NOT DETECTED Final   Norovirus GI/GII NOT DETECTED NOT DETECTED Final   Rotavirus A NOT DETECTED NOT DETECTED Final   Sapovirus (I, II, IV, and V) NOT DETECTED NOT DETECTED Final    Comment: Performed at [facility], [address], [place], [place] [address]  Radiology Studies: DG Abd Portable 1V-Small Bowel Obstruction Protocol-initial, 8 hr delay Result Date: 10/20/2023 CLINICAL DATA:  Right lower quadrant abdominal pain EXAM: PORTABLE ABDOMEN - 1 VIEW COMPARISON:  None Available. FINDINGS: Administered oral contrast opacifies a nondistended colon rectal vault. Normal abdominal gas pattern. No gross free intraperitoneal gas. Subdiaphragmatic region is excluded view. No nephro or urolithiasis. No acut bone abnormality e IMPRESSION: 1. Nonobstructive bowel gas pattern. Electronically Signed   By: Worthy Heads M.D.   On: 10/20/2023 19:25   CT ABDOMEN PELVIS W CONTRAST Result Date: 10/19/2023 CLINICAL DATA:  Right lower quadrant abdominal pain for 1 week, nausea and vomiting, chills, metastatic non-small cell lung cancer EXAM: CT ABDOMEN AND PELVIS WITH CONTRAST TECHNIQUE: Multidetector CT imaging of the abdomen and  pelvis was performed using the standard protocol following bolus administration of intravenous contrast. RADIATION DOSE REDUCTION: This exam was performed according to the departmental dose-optimization program which includes automated exposure control, adjustment of the mA and/or kV according to patient size and/or use of iterative reconstruction technique. CONTRAST:  OMNIPAQUE  IOHEXOL  300 MG/ML  SOLN COMPARISON:  10/16/2023, 10/12/2023 FINDINGS: Lower chest: No acute pleural or parenchymal lung disease. Hepatobiliary: No focal liver abnormality is seen. Status post cholecystectomy. No biliary dilatation. Pancreas: Unremarkable. No pancreatic ductal dilatation or surrounding inflammatory changes. Spleen: Normal in size without focal abnormality. Adrenals/Urinary Tract: Kidneys enhance normally and symmetrically. No urinary tract calculi or obstruction. The adrenals and bladder are unremarkable. Stomach/Bowel: Moderate dilation of the distal small bowel, measuring up to 3.6 cm in diameter, consistent with small bowel obstruction. There is an abrupt transition point within the distal jejunum right lower quadrant reference image 63/2. Colon is decompressed. Normal appendix right lower quadrant. No bowel wall thickening or inflammatory change. Vascular/Lymphatic: Aortic atherosclerosis. No enlarged abdominal or pelvic lymph nodes. Reproductive: Status post hysterectomy. No adnexal masses. Other: No free fluid or free intraperitoneal gas. No abdominal wall hernia. Musculoskeletal: No acute or destructive bony abnormalities. Reconstructed images demonstrate no additional findings. IMPRESSION: 1. Small-bowel obstruction, with transition point within the distal jejunum right lower quadrant as above. 2.  Aortic Atherosclerosis (ICD10-I70.0). Electronically Signed   By: Bobbye Burrow M.D.   On: 10/19/2023 15:38        Scheduled Meds:  enoxaparin  (LOVENOX ) injection  60 mg Subcutaneous Q24H   Continuous  Infusions:  lactated ringers  100 mL/hr at 10/21/23 0135     LOS: 2 days       Vada Garibaldi, MD [facility]

## 2023-10-22 ENCOUNTER — Ambulatory Visit: Admitting: Hematology

## 2023-10-22 DIAGNOSIS — K56609 Unspecified intestinal obstruction, unspecified as to partial versus complete obstruction: Secondary | ICD-10-CM | POA: Diagnosis not present

## 2023-10-22 LAB — COMPREHENSIVE METABOLIC PANEL WITH GFR
ALT: 45 U/L — ABNORMAL HIGH (ref 0–44)
AST: 63 U/L — ABNORMAL HIGH (ref 15–41)
Albumin: 3.4 g/dL — ABNORMAL LOW (ref 3.5–5.0)
Alkaline Phosphatase: 83 U/L (ref 38–126)
Anion gap: 7 (ref 5–15)
BUN: 8 mg/dL (ref 6–20)
CO2: 25 mmol/L (ref 22–32)
Calcium: 8.7 mg/dL — ABNORMAL LOW (ref 8.9–10.3)
Chloride: 102 mmol/L (ref 98–111)
Creatinine, Ser: 1.15 mg/dL — ABNORMAL HIGH (ref 0.44–1.00)
GFR, Estimated: 56 mL/min — ABNORMAL LOW (ref >60)
Glucose, Bld: 92 mg/dL (ref 70–99)
Potassium: 3.6 mmol/L (ref 3.5–5.1)
Sodium: 134 mmol/L — ABNORMAL LOW (ref 135–145)
Total Bilirubin: 0.7 mg/dL (ref 0.0–1.2)
Total Protein: 6.1 g/dL — ABNORMAL LOW (ref 6.5–8.1)

## 2023-10-22 LAB — CBC WITH DIFFERENTIAL/PLATELET
Abs Immature Granulocytes: 0 10*3/uL (ref 0.00–0.07)
Basophils Absolute: 0 10*3/uL (ref 0.0–0.1)
Basophils Relative: 0 %
Eosinophils Absolute: 0 10*3/uL (ref 0.0–0.5)
Eosinophils Relative: 0 %
HCT: 32.5 % — ABNORMAL LOW (ref 36.0–46.0)
Hemoglobin: 10.7 g/dL — ABNORMAL LOW (ref 12.0–15.0)
Immature Granulocytes: 0 %
Lymphocytes Relative: 50 %
Lymphs Abs: 0.9 10*3/uL (ref 0.7–4.0)
MCH: 28.5 pg (ref 26.0–34.0)
MCHC: 32.9 g/dL (ref 30.0–36.0)
MCV: 86.4 fL (ref 80.0–100.0)
Monocytes Absolute: 0.2 10*3/uL (ref 0.1–1.0)
Monocytes Relative: 9 %
Neutro Abs: 0.7 10*3/uL — ABNORMAL LOW (ref 1.7–7.7)
Neutrophils Relative %: 41 %
Platelets: 102 10*3/uL — ABNORMAL LOW (ref 150–400)
RBC: 3.76 MIL/uL — ABNORMAL LOW (ref 3.87–5.11)
RDW: 13.1 % (ref 11.5–15.5)
WBC: 1.8 10*3/uL — ABNORMAL LOW (ref 4.0–10.5)
nRBC: 0 % (ref 0.0–0.2)

## 2023-10-22 LAB — MAGNESIUM: Magnesium: 2 mg/dL (ref 1.7–2.4)

## 2023-10-22 LAB — PHOSPHORUS: Phosphorus: 3.9 mg/dL (ref 2.5–4.6)

## 2023-10-22 NOTE — Progress Notes (Signed)
 PROGRESS NOTE    Abigail Golden  WUJ:[id number] DOB: [birth year] DOA: 10/19/2023 PCP: [name], PA-C    Brief Narrative:  58 y.o. 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 During that hospital stay, stool studies were done which were positive for vibrio species (patient was contacted after discharge, but stated her stools were improved). Presented to her doctor's office, and was then transferred to the ER where workup showed SBO. General surgery consulted.  Patient admitted for further management.   Subjective:  Patient seen and examined.  Today her symptoms are fairly controlled.  Denies any nausea except occasional feeling not needing medications. Going to bathroom frequently but a small volume.  Abdominal pain is mild to moderate.  Tolerating full liquid.  Advancing to soft diet.  Mobilize around.  If feels comfortable, discharge later today or tomorrow.  Assessment & Plan:   Partial small bowel obstruction, ileus likely secondary to recent gastroenteritis from vibrio: Presented with abdominal discomfort.  Noted to have transition point in the distal jejunum.  Initially treated with IV fluids, n.p.o. and antiemetics.  Clinically improving. Has adequate bowel functions. Tolerating liquid diet. Advance to soft diet and monitor. C. difficile negative Repeat GI pathogen panel negative.  Recent vibrio infection: On a stool sample from 4/22.  This may be responsible for all current issues. Symptomatic management.  Clinically improving.  Repeat C. difficile and GI pathogen panel negative.   CKD stage IIIb: At about  baseline.  Electrolytes are adequate.  Anxiety: On Xanax .  Stage IV lung cancer with brain mets: Followed by Dr. Maryalice Smaller.  Tagrisso  on hold.   DVT prophylaxis: Lovenox  subcu   Code Status: Full code Family Communication: None at the bedside Disposition Plan: Status is: Inpatient Remains inpatient appropriate because: Ongoing bowel management     Consultants:  General Surgery Oncology  Procedures:  None  Antimicrobials:  None     Objective: Vitals:   10/21/23 0630 10/21/23 1424 10/21/23 2038 10/22/23 0607  BP: 125/69 (!) 121/57 (!) 123/59 130/65  Pulse: 65 67 67 (!) 58  Resp: 15 16 16 16   Temp: 98.1 F (36.7 C) 97.8 F (36.6 C) 97.7 F (36.5 C) 97.8 F (36.6 C)  TempSrc: Oral Oral Oral Oral  SpO2: 96% 97% 98% 98%  Weight:      Height:        Intake/Output Summary (Last 24 hours) at 10/22/2023 1055 Last data filed at 10/22/2023 0954 Gross per 24 hour  Intake 2176.12 ml  Output 150 ml  Net 2026.12 ml   Filed Weights   10/19/23 1805  Weight: 116.9 kg    Examination:  General: Fairly comfortable.  Pleasant and interactive. Cardiovascular: 1 S2 normal.  Regular rate rhythm. Respiratory: Bilateral clear.  No added sounds. Gastrointestinal: Soft.  Nontender.  Bowel sounds present and hyperactive. Ext: No swelling or edema. Neuro: Alert awake oriented. Musculoskeletal: No deformities. Skin: Intact.      Data Reviewed: I have personally reviewed following labs and imaging studies  CBC: Recent Labs  Lab 10/19/23 1220 10/20/23 0444 10/21/23 0450 10/22/23 0456  WBC 5.1 2.6* 2.5* 1.8*  NEUTROABS  --   --  1.4* 0.7*  HGB 13.6 10.4*  10.1* 10.7*  HCT 42.3 32.3* 30.9* 32.5*  MCV 88.1 88.5 87.3 86.4  PLT 160 122* 105* 102*   Basic Metabolic Panel: Recent Labs  Lab 10/19/23 1220 10/20/23 0444 10/21/23 0450 10/22/23 0456  NA 135 136 135 134*  K 4.4 4.0 3.8 3.6  CL 103 105 105 102  CO2 22 23 23 25   GLUCOSE 147* 103* 89 92  BUN 17 15 13 8    CREATININE 1.21* 1.11* 1.24* 1.15*  CALCIUM 9.9 8.9 8.7* 8.7*  MG  --   --   --  2.0  PHOS  --   --   --  3.9   GFR: Estimated Creatinine Clearance: 65 mL/min (A) (by C-G formula based on SCr of 1.15 mg/dL (H)). Liver Function Tests: Recent Labs  Lab 10/19/23 1220 10/22/23 0456  AST 22 63*  ALT 19 45*  ALKPHOS 64 83  BILITOT 0.9 0.7  PROT 8.0 6.1*  ALBUMIN 4.4 3.4*   Recent Labs  Lab 10/19/23 1220  LIPASE 36   No results for input(s): "AMMONIA" in the last 168 hours. Coagulation Profile: No results for input(s): "INR", "PROTIME" in the last 168 hours. Cardiac Enzymes: No results for input(s): "CKTOTAL", "CKMB", "CKMBINDEX", "TROPONINI" in the last 168 hours. BNP (last 3 results) No results for input(s): "PROBNP" in the last 8760 hours. HbA1C: No results for input(s): "HGBA1C" in the last 72 hours. CBG: Recent Labs  Lab 10/16/23 1241  GLUCAP 91   Lipid Profile: No results for input(s): "CHOL", "HDL", "LDLCALC", "TRIG", "CHOLHDL", "LDLDIRECT" in the last 72 hours. Thyroid Function Tests: No results for input(s): "TSH", "T4TOTAL", "FREET4", "T3FREE", "THYROIDAB" in the last 72 hours. Anemia Panel: No results for input(s): "VITAMINB12", "FOLATE", "FERRITIN", "TIBC", "IRON", "RETICCTPCT" in the last 72 hours. Sepsis Labs: No results for input(s): "PROCALCITON", "LATICACIDVEN" in the last 168 hours.  Recent Results (from the past 240 hours)  Gastrointestinal Panel by PCR , Stool     Status: Abnormal   Collection Time: 10/13/23  5:47 AM   Specimen: Rectum; Stool  Result Value Ref Range Status   Campylobacter species NOT DETECTED NOT DETECTED Final   Plesimonas shigelloides NOT DETECTED NOT DETECTED Final   Salmonella species NOT DETECTED NOT DETECTED Final   Yersinia enterocolitica NOT DETECTED NOT DETECTED Final   Vibrio species DETECTED (A) NOT DETECTED Final    Comment: RESULT CALLED TO, READ BACK BY AND VERIFIED WITH: STEPHANIE WRENN RN @0320  10/14/23 ASW     Vibrio cholerae NOT DETECTED NOT DETECTED Final   Enteroaggregative E coli (EAEC) NOT DETECTED NOT DETECTED Final   Enteropathogenic E coli (EPEC) NOT DETECTED NOT DETECTED Final   Enterotoxigenic E coli (ETEC) NOT DETECTED NOT DETECTED Final   Shiga like toxin producing E coli (STEC) NOT DETECTED NOT DETECTED Final   Shigella/Enteroinvasive E coli (EIEC) NOT DETECTED NOT DETECTED Final   Cryptosporidium NOT DETECTED NOT DETECTED Final   Cyclospora cayetanensis NOT DETECTED NOT DETECTED Final   Entamoeba histolytica NOT DETECTED NOT DETECTED Final   Giardia lamblia NOT DETECTED NOT DETECTED Final   Adenovirus F40/41 NOT DETECTED NOT DETECTED Final   Astrovirus NOT DETECTED NOT DETECTED Final   Norovirus GI/GII NOT DETECTED NOT DETECTED Final   Rotavirus A NOT DETECTED NOT DETECTED Final   Sapovirus (I, II, IV, and V) NOT DETECTED NOT DETECTED Final    Comment: Performed at [facility], [address] [place], [place] [address]  Gastrointestinal Panel by PCR , Stool     Status:  None   Collection Time: 10/20/23  3:08 PM   Specimen: STOOL  Result Value Ref Range Status   Campylobacter species NOT DETECTED NOT DETECTED Final   Plesimonas shigelloides NOT DETECTED NOT DETECTED Final   Salmonella species NOT DETECTED NOT DETECTED Final   Yersinia enterocolitica NOT DETECTED NOT DETECTED Final   Vibrio species NOT DETECTED NOT DETECTED Final   Vibrio cholerae NOT DETECTED NOT DETECTED Final   Enteroaggregative E coli (EAEC) NOT DETECTED NOT DETECTED Final   Enteropathogenic E coli (EPEC) NOT DETECTED NOT DETECTED Final   Enterotoxigenic E coli (ETEC) NOT DETECTED NOT DETECTED Final   Shiga like toxin producing E coli (STEC) NOT DETECTED NOT DETECTED Final   Shigella/Enteroinvasive E coli (EIEC) NOT DETECTED NOT DETECTED Final   Cryptosporidium NOT DETECTED NOT DETECTED Final   Cyclospora cayetanensis NOT DETECTED NOT DETECTED Final   Entamoeba histolytica NOT DETECTED NOT  DETECTED Final   Giardia lamblia NOT DETECTED NOT DETECTED Final   Adenovirus F40/41 NOT DETECTED NOT DETECTED Final   Astrovirus NOT DETECTED NOT DETECTED Final   Norovirus GI/GII NOT DETECTED NOT DETECTED Final   Rotavirus A NOT DETECTED NOT DETECTED Final   Sapovirus (I, II, IV, and V) NOT DETECTED NOT DETECTED Final    Comment: Performed at [facility], [address], [place], [place] [address]         Radiology Studies: DG Abd Portable 1V-Small Bowel Obstruction Protocol-initial, 8 hr delay Result Date: 10/20/2023 CLINICAL DATA:  Right lower quadrant abdominal pain EXAM: PORTABLE ABDOMEN - 1 VIEW COMPARISON:  None Available. FINDINGS: Administered oral contrast opacifies a nondistended colon rectal vault. Normal abdominal gas pattern. No gross free intraperitoneal gas. Subdiaphragmatic region is excluded view. No nephro or urolithiasis. No acut bone abnormality e IMPRESSION: 1. Nonobstructive bowel gas pattern. Electronically Signed   By: Worthy Heads M.D.   On: 10/20/2023 19:25        Scheduled Meds:  enoxaparin  (LOVENOX ) injection  60 mg Subcutaneous Q24H   Continuous Infusions:     LOS: 3 days       Vada Garibaldi, MD [facility]

## 2023-10-22 NOTE — Progress Notes (Signed)
 Discharge instructions given to patient. Questions asked and answered. Discharged to DC lounge [facility] [employer]

## 2023-10-22 NOTE — Discharge Summary (Signed)
 Physician Discharge Summary  Abigail Golden:[id number] DOB: 1965-08-22 DOA: 10/19/2023  PCP: [name], PA-C  Admit date: 10/19/2023 Discharge date: 10/22/2023  Admitted From: Home Disposition: Home  Recommendations for Outpatient Follow-up:  Follow up with PCP in 1-2 weeks CMP/CBC/magnesium/phosphorus in 1 week Oncology to schedule follow-up   Discharge Condition: Stable CODE STATUS: Full code Diet recommendation: Soft diet, liquid and frequent hydration  Discharge summary: 58 y.o. 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 During that hospital stay, stool studies were done which were positive for vibrio species (patient was contacted after discharge, but stated her stools were improved). Presented to her doctor's office, and was then transferred to the ER where workup showed SBO. General surgery consulted.  Patient admitted for further management.   # Partial small bowel obstruction, ileus likely secondary to recent gastroenteritis from vibrio: Presented with abdominal discomfort.  Noted to have transition point in the distal jejunum.  Initially treated with IV fluids, n.p.o. and antiemetics.  Clinically improving. Has adequate bowel functions. Tolerating soft diet today. C. difficile negative Repeat GI pathogen panel negative. Has adequate bowel functions.  Bowel movements are improving in frequency and volume.  Avoiding using Imodium. Discharged with advice.   Recent vibrio infection: On a stool sample from 4/22.  This may be responsible for all current issues. Symptomatic management.  Clinically improving.   Repeat C. difficile and GI pathogen panel negative.  No isolation needed.   CKD stage IIIb: At about baseline.  Electrolytes are adequate.   Anxiety: On Xanax .  Continue at home.  She is also on Celexa  and other medications.   Stage IV lung cancer with brain mets: Followed by Dr. Maryalice Smaller.  Tagrisso  on hold.  Can resume on discharge.  PET scan to be reviewed with oncologist on follow-up.  Medically stable for discharge.  Discharge Diagnoses:  Principal Problem:   SBO (small bowel obstruction) [facility])    Discharge Instructions  Discharge Instructions     Call MD for:  persistant nausea and vomiting   Complete by: As directed    Call MD for:  severe uncontrolled pain   Complete by: As directed    Diet general   Complete by: As directed    Stay mostly on liquids and soft food   Increase activity slowly   Complete by: As directed       Allergies as of 10/22/2023       Reactions   Celebrex [celecoxib] Itching, Rash   Facial itching   Minocycline Itching, Rash   Facial itching        Medication List     STOP taking these medications    benzonatate 100 MG capsule Commonly known as: TESSALON       TAKE these medications    acetaminophen  500 MG tablet Commonly known as: TYLENOL  Take 500 mg by mouth every 6 (six) hours as needed for moderate pain or headache.   albuterol  108 (90 Base) MCG/ACT inhaler Commonly known as: VENTOLIN  HFA Inhale 2 puffs into the lungs every 4 (four) hours as needed.   ALPRAZolam  0.25 MG tablet Commonly known as: XANAX  Take 0.25 mg by mouth every 4 (four) hours as needed for  anxiety. for anxiety   aspirin EC 81 MG tablet Take 81 mg by mouth daily. Swallow whole.   citalopram  10 MG tablet Commonly known as: CELEXA  Take 10 mg by mouth at bedtime.   Diclofenac Potassium(Migraine) 50 MG Pack Take 1 each by mouth as needed. As needed for migraines   diclofenac sodium 1 % Gel Commonly known as: VOLTAREN Apply topically as needed. To  affected area   diphenhydrAMINE  25 MG tablet Commonly known as: BENADRYL  Take 25 mg by mouth See admin instructions. 25 mg at bedtime 25 mg as needed for itching   eletriptan 20 MG tablet Commonly known as: RELPAX Take 20 mg by mouth as needed for headache (at onset of migraine.). May repeat in 2 hours if needed.   fluticasone 50 MCG/ACT nasal spray Commonly known as: FLONASE Place 2 sprays into both nostrils daily as needed for allergies.   HYDROcodone -acetaminophen  5-325 MG tablet Commonly known as: NORCO/VICODIN Take 1 tablet by mouth every 6 (six) hours as needed for moderate pain (pain score 4-6).   hydrOXYzine  25 MG tablet Commonly known as: ATARAX  Take 25 mg by mouth See admin instructions. 25 mg at bedtime 25 mg during the day as needed for itching/anxiety   loratadine  10 MG tablet Commonly known as: CLARITIN  Take 10 mg by mouth daily.   montelukast  10 MG tablet Commonly known as: SINGULAIR  Take 10 mg by mouth at bedtime.   nystatin  powder Commonly known as: nystatin  APPLY  POWDER TOPICALLY TO AFFECTED AREA THREE TIMES DAILY What changed:  how much to take how to take this when to take this reasons to take this   ondansetron  4 MG disintegrating tablet Commonly known as: ZOFRAN -ODT DISSOLVE 1 TABLET IN MOUTH EVERY 8 HOURS AS NEEDED FOR NAUSEA AND VOMITING   prochlorperazine  10 MG tablet Commonly known as: COMPAZINE  TAKE 1 TABLET BY MOUTH EVERY 6 HOURS AS NEEDED FOR NAUSEA FOR VOMITING   solifenacin 10 MG tablet Commonly known as: VESICARE Take 10 mg by mouth every evening.   STOOL SOFTENER LAXATIVE PO Take 1 tablet by mouth as needed.   Tagrisso  80 MG tablet Generic drug: osimertinib  mesylate Take 1 tablet (80 mg total) by mouth daily.   triamcinolone cream 0.1 % Commonly known as: KENALOG Apply 1 Application topically 2 (two) times daily.   zolpidem  10 MG tablet Commonly known as: AMBIEN  TAKE 1/2 TO 1 (ONE-HALF TO ONE) TABLET BY MOUTH AT  BEDTIME AS NEEDED FOR SLEEP        Allergies  Allergen Reactions   Celebrex [Celecoxib] Itching and Rash    Facial itching   Minocycline Itching and Rash    Facial itching     Consultations: General Surgery Oncology   Procedures/Studies: NM PET Image Restag (PS) Skull Base To Thigh Result Date: 10/22/2023 CLINICAL DATA:  Subsequent treatment strategy for non-small-cell lung cancer. Primary adenocarcinoma of the left upper lobe of the lung. EXAM: NUCLEAR MEDICINE PET SKULL BASE TO THIGH TECHNIQUE: 12.89 mCi F-18 FDG was injected intravenously. Full-ring PET imaging was performed from the skull base to thigh after the radiotracer. CT data was obtained and used for attenuation correction and anatomic localization. Fasting blood glucose: 91 mg/dl COMPARISON:  PET-CT 16/03/9603. Chest abdomen pelvis CT 06/22/2023. Abdomen pelvis CT 10/12/2023 FINDINGS: Mediastinal blood pool activity: SUV max 3.8 Liver activity: SUV max 4.7 NECK: On the prior PET-CT scan there is hypermetabolic left supraclavicular node. This is no longer seen. No abnormal uptake in this location. No specific abnormal uptake  seen along the neck including lymph node change of the submandibular, posterior triangle or internal jugular region overall. There is symmetric uptake along the visualized intracranial compartment. Incidental CT findings: Left thyroid lobe is smaller than the right. This is new from prior PET-CT. There is some streak artifact related to the patient's dental hardware. The parotid glands and submandibular glands are preserved. Paranasal sinuses and mastoid air cells are clear. Please see prior MRI room from 09/22/2023 describing lesions. CHEST: The nodular opacity identified posteriorly along the left upper lobe medially is unchanged today from recent CT scan. Area on image 52 the CT measures 3.3 x 2.0 cm. In December 2024 area with measured 3.5 by 2.1 cm measured in the same fashion. There appear to be some air  bronchograms within this nodular structure. Uptake in this area has maximum SUV 4.5, just above blood pool. No additional areas of abnormal uptake along the lung parenchyma. No abnormal uptake above mediastinal blood pool in the axillary regions, hilum or mediastinum. Incidental CT findings: Breathing motion. Basilar atelectasis or scarring at the right lung base. Overall some volume loss along the left hemithorax. No pleural effusion. No pericardial effusion. Heart is nonenlarged. Normal caliber thoracic aorta. ABDOMEN/PELVIS: There is physiologic distribution radiotracer along the parenchymal organs, bowel and renal collecting systems. Incidental CT findings: Grossly on this noncontrast, attenuation correction CT, liver, spleen, adrenal glands and pancreas are unremarkable. Gallbladder is not seen. Motion seen throughout the examination. No abnormal calcification seen within either kidney nor along the course of either ureter. Underdistended urinary bladder. Mild atherosclerotic calcified plaque. Retro or aortic left renal vein. Large bowel has a normal course and caliber. Mild scattered colonic stool. Few left-sided colonic diverticula. Normal appendix. Stomach has some luminal fluid and air. Fluid-filled distended loops of small bowel are no longer identified. No free air or free fluid. SKELETON: No specific abnormal uptake along the visualized osseous structures. Scattered degenerative changes of the spine and pelvis. IMPRESSION: Nodular opacity along the medial left upper lobe is slightly smaller compared to the study of December 2024 has minimal uptake. Again this could be posttreatment related. Recommend continued surveillance. No new other areas of abnormal radiotracer uptake elsewhere. The mildly dilated fluid-filled loops of small bowel seen on abdomen pelvis study of October 12, 2023 are improved on the current examination. Electronically Signed   By: Adrianna Horde M.D.   On: 10/22/2023 10:35   DG Abd  Portable 1V-Small Bowel Obstruction Protocol-initial, 8 hr delay Result Date: 10/20/2023 CLINICAL DATA:  Right lower quadrant abdominal pain EXAM: PORTABLE ABDOMEN - 1 VIEW COMPARISON:  None Available. FINDINGS: Administered oral contrast opacifies a nondistended colon rectal vault. Normal abdominal gas pattern. No gross free intraperitoneal gas. Subdiaphragmatic region is excluded view. No nephro or urolithiasis. No acut bone abnormality e IMPRESSION: 1. Nonobstructive bowel gas pattern. Electronically Signed   By: Worthy Heads M.D.   On: 10/20/2023 19:25   CT ABDOMEN PELVIS W CONTRAST Result Date: 10/19/2023 CLINICAL DATA:  Right lower quadrant abdominal pain for 1 week, nausea and vomiting, chills, metastatic non-small cell lung cancer EXAM: CT ABDOMEN AND PELVIS WITH CONTRAST TECHNIQUE: Multidetector CT imaging of the abdomen and pelvis was performed using the standard protocol following bolus administration of intravenous contrast. RADIATION DOSE REDUCTION: This exam was performed according to the departmental dose-optimization program which includes automated exposure control, adjustment of the mA and/or kV according to patient size and/or use of iterative reconstruction technique. CONTRAST:  OMNIPAQUE  IOHEXOL  300 MG/ML  SOLN COMPARISON:  10/16/2023, 10/12/2023 FINDINGS: Lower chest: No acute pleural or parenchymal lung disease. Hepatobiliary: No focal liver abnormality is seen. Status post cholecystectomy. No biliary dilatation. Pancreas: Unremarkable. No pancreatic ductal dilatation or surrounding inflammatory changes. Spleen: Normal in size without focal abnormality. Adrenals/Urinary Tract: Kidneys enhance normally and symmetrically. No urinary tract calculi or obstruction. The adrenals and bladder are unremarkable. Stomach/Bowel: Moderate dilation of the distal small bowel, measuring up to 3.6 cm in diameter, consistent with small bowel obstruction. There is an abrupt transition point within  the distal jejunum right lower quadrant reference image 63/2. Colon is decompressed. Normal appendix right lower quadrant. No bowel wall thickening or inflammatory change. Vascular/Lymphatic: Aortic atherosclerosis. No enlarged abdominal or pelvic lymph nodes. Reproductive: Status post hysterectomy. No adnexal masses. Other: No free fluid or free intraperitoneal gas. No abdominal wall hernia. Musculoskeletal: No acute or destructive bony abnormalities. Reconstructed images demonstrate no additional findings. IMPRESSION: 1. Small-bowel obstruction, with transition point within the distal jejunum right lower quadrant as above. 2.  Aortic Atherosclerosis (ICD10-I70.0). Electronically Signed   By: Bobbye Burrow M.D.   On: 10/19/2023 15:38   DG Abd 1 View Result Date: 10/12/2023 CLINICAL DATA:  SBO EXAM: ABDOMEN - 1 VIEW COMPARISON:  None Available. FINDINGS: The bowel gas pattern is normal. Retained oral contrast in colon and rectosigmoid. No radio-opaque calculi or other significant radiographic abnormality are seen. IMPRESSION: Negative. Electronically Signed   By: Sydell Eva M.D.   On: 10/12/2023 20:34   DG Abd Portable 1V Result Date: 10/12/2023 CLINICAL DATA:  Small bowel obstruction right follow-up EXAM: PORTABLE ABDOMEN - 1 VIEW COMPARISON:  CT 10/12/2023 FINDINGS: Contrast material noted within the colon and decompressed lower abdominal/pelvic small bowel loops. Slight prominence of lower abdominal small bowel loops again noted. No free air or organomegaly. IMPRESSION: Contrast material noted throughout the colon and in decompressed distal small bowel loops. Slight prominence of mid to lower abdominal small bowel loops could reflect mild ileus or early low grade obstruction. Electronically Signed   By: Janeece Mechanic M.D.   On: 10/12/2023 13:56   CT ABDOMEN PELVIS W CONTRAST Result Date: 10/12/2023 CLINICAL DATA:  Right lower quadrant abdominal pain. EXAM: CT ABDOMEN AND PELVIS WITH CONTRAST  TECHNIQUE: Multidetector CT imaging of the abdomen and pelvis was performed using the standard protocol following bolus administration of intravenous contrast. RADIATION DOSE REDUCTION: This exam was performed according to the departmental dose-optimization program which includes automated exposure control, adjustment of the mA and/or kV according to patient size and/or use of iterative reconstruction technique. CONTRAST:  OMNIPAQUE  IOHEXOL  300 MG/ML  SOLN COMPARISON:  June 22, 2023 FINDINGS: Lower chest: No acute abnormality. Hepatobiliary: No focal liver abnormality is seen. Status post cholecystectomy. No biliary dilatation. Pancreas: Unremarkable. No pancreatic ductal dilatation or surrounding inflammatory changes. Spleen: Normal in size without focal abnormality. Adrenals/Urinary Tract: Adrenal glands are unremarkable. Kidneys are normal, without renal calculi, focal lesion, or hydronephrosis. Bladder is unremarkable. Stomach/Bowel: Stomach is within normal limits. Appendix appears normal. Mildly dilated small bowel loops are seen within the right lower quadrant and right hemipelvis (maximum small bowel diameter of approximately 3.3 cm). A midline transition zone is suspected within the anterior aspect of the lower abdomen (coronal reformatted images 63 through 75, CT series 8) Vascular/Lymphatic: No significant vascular findings are present. No enlarged abdominal or pelvic lymph nodes. Reproductive: Status post hysterectomy. No adnexal masses. Other: No abdominal wall hernia or abnormality. No abdominopelvic ascites. Musculoskeletal: Moderate severity degenerative changes are  seen at the level of L3-L4. IMPRESSION: 1. Findings consistent with a distal partial small bowel obstruction. 2. Evidence of prior cholecystectomy and prior hysterectomy. Electronically Signed   By: Virgle Grime M.D.   On: 10/12/2023 02:00   (Echo, Carotid, EGD, Colonoscopy, ERCP)    Subjective: Patient seen and  examined.  Examined again in the afternoon after she ate lunch.  She has slightly rambling of the abdomen but denies any complaints.  Feels comfortable with plan to go home.   Discharge Exam: Vitals:   10/21/23 2038 10/22/23 0607  BP: (!) 123/59 130/65  Pulse: 67 (!) 58  Resp: 16 16  Temp: 97.7 F (36.5 C) 97.8 F (36.6 C)  SpO2: 98% 98%   Vitals:   10/21/23 0630 10/21/23 1424 10/21/23 2038 10/22/23 0607  BP: 125/69 (!) 121/57 (!) 123/59 130/65  Pulse: 65 67 67 (!) 58  Resp: 15 16 16 16   Temp: 98.1 F (36.7 C) 97.8 F (36.6 C) 97.7 F (36.5 C) 97.8 F (36.6 C)  TempSrc: Oral Oral Oral Oral  SpO2: 96% 97% 98% 98%  Weight:      Height:        General: Pt is alert, awake, not in acute distress Cardiovascular: RRR, S1/S2 +, no rubs, no gallops Respiratory: CTA bilaterally, no wheezing, no rhonchi Abdominal: Soft, NT, ND, bowel sounds + Extremities: no edema, no cyanosis    The results of significant diagnostics from this hospitalization (including imaging, microbiology, ancillary and laboratory) are listed below for reference.     Microbiology: Recent Results (from the past 240 hours)  Gastrointestinal Panel by PCR , Stool     Status: Abnormal   Collection Time: 10/13/23  5:47 AM   Specimen: Rectum; Stool  Result Value Ref Range Status   Campylobacter species NOT DETECTED NOT DETECTED Final   Plesimonas shigelloides NOT DETECTED NOT DETECTED Final   Salmonella species NOT DETECTED NOT DETECTED Final   Yersinia enterocolitica NOT DETECTED NOT DETECTED Final   Vibrio species DETECTED (A) NOT DETECTED Final    Comment: RESULT CALLED TO, READ BACK BY AND VERIFIED WITH: STEPHANIE WRENN RN @0320  10/14/23 ASW    Vibrio cholerae NOT DETECTED NOT DETECTED Final   Enteroaggregative E coli (EAEC) NOT DETECTED NOT DETECTED Final   Enteropathogenic E coli (EPEC) NOT DETECTED NOT DETECTED Final   Enterotoxigenic E coli (ETEC) NOT DETECTED NOT DETECTED Final   Shiga like toxin  producing E coli (STEC) NOT DETECTED NOT DETECTED Final   Shigella/Enteroinvasive E coli (EIEC) NOT DETECTED NOT DETECTED Final   Cryptosporidium NOT DETECTED NOT DETECTED Final   Cyclospora cayetanensis NOT DETECTED NOT DETECTED Final   Entamoeba histolytica NOT DETECTED NOT DETECTED Final   Giardia lamblia NOT DETECTED NOT DETECTED Final   Adenovirus F40/41 NOT DETECTED NOT DETECTED Final   Astrovirus NOT DETECTED NOT DETECTED Final   Norovirus GI/GII NOT DETECTED NOT DETECTED Final   Rotavirus A NOT DETECTED NOT DETECTED Final   Sapovirus (I, II, IV, and V) NOT DETECTED NOT DETECTED Final    Comment: Performed at [facility], [address] [place], [place] [address]  Gastrointestinal Panel by PCR , Stool     Status: None   Collection Time: 10/20/23  3:08 PM   Specimen: STOOL  Result Value Ref Range Status   Campylobacter species NOT DETECTED NOT DETECTED Final   Plesimonas shigelloides NOT DETECTED NOT DETECTED Final   Salmonella species NOT DETECTED NOT DETECTED Final   Yersinia enterocolitica NOT  DETECTED NOT DETECTED Final   Vibrio species NOT DETECTED NOT DETECTED Final   Vibrio cholerae NOT DETECTED NOT DETECTED Final   Enteroaggregative E coli (EAEC) NOT DETECTED NOT DETECTED Final   Enteropathogenic E coli (EPEC) NOT DETECTED NOT DETECTED Final   Enterotoxigenic E coli (ETEC) NOT DETECTED NOT DETECTED Final   Shiga like toxin producing E coli (STEC) NOT DETECTED NOT DETECTED Final   Shigella/Enteroinvasive E coli (EIEC) NOT DETECTED NOT DETECTED Final   Cryptosporidium NOT DETECTED NOT DETECTED Final   Cyclospora cayetanensis NOT DETECTED NOT DETECTED Final   Entamoeba histolytica NOT DETECTED NOT DETECTED Final   Giardia lamblia NOT DETECTED NOT DETECTED Final   Adenovirus F40/41 NOT DETECTED NOT DETECTED Final   Astrovirus NOT DETECTED NOT DETECTED Final   Norovirus GI/GII NOT DETECTED NOT DETECTED Final   Rotavirus A NOT DETECTED NOT DETECTED Final    Sapovirus (I, II, IV, and V) NOT DETECTED NOT DETECTED Final    Comment: Performed at [facility], [address] [place], [place] [address]     Labs: BNP (last 3 results) No results for input(s): "BNP" in the last 8760 hours. Basic Metabolic Panel: Recent Labs  Lab 10/19/23 1220 10/20/23 0444 10/21/23 0450 10/22/23 0456  NA 135 136 135 134*  K 4.4 4.0 3.8 3.6  CL 103 105 105 102  CO2 22 23 23 25   GLUCOSE 147* 103* 89 92  BUN 17 15 13 8   CREATININE 1.21* 1.11* 1.24* 1.15*  CALCIUM 9.9 8.9 8.7* 8.7*  MG  --   --   --  2.0  PHOS  --   --   --  3.9   Liver Function Tests: Recent Labs  Lab 10/19/23 1220 10/22/23 0456  AST 22 63*  ALT 19 45*  ALKPHOS 64 83  BILITOT 0.9 0.7  PROT 8.0 6.1*  ALBUMIN 4.4 3.4*   Recent Labs  Lab 10/19/23 1220  LIPASE 36   No results for input(s): "AMMONIA" in the last 168 hours. CBC: Recent Labs  Lab 10/19/23 1220 10/20/23 0444 10/21/23 0450 10/22/23 0456  WBC 5.1 2.6* 2.5* 1.8*  NEUTROABS  --   --  1.4* 0.7*  HGB 13.6 10.4* 10.1* 10.7*  HCT 42.3 32.3* 30.9* 32.5*  MCV 88.1 88.5 87.3 86.4  PLT 160 122* 105* 102*   Cardiac Enzymes: No results for input(s): "CKTOTAL", "CKMB", "CKMBINDEX", "TROPONINI" in the last 168 hours. BNP: Invalid input(s): "POCBNP" CBG: Recent Labs  Lab 10/16/23 1241  GLUCAP 91   D-Dimer No results for input(s): "DDIMER" in the last 72 hours. Hgb A1c No results for input(s): "HGBA1C" in the last 72 hours. Lipid Profile No results for input(s): "CHOL", "HDL", "LDLCALC", "TRIG", "CHOLHDL", "LDLDIRECT" in the last 72 hours. Thyroid function studies No results for input(s): "TSH", "T4TOTAL", "T3FREE", "THYROIDAB" in the last 72 hours.  Invalid input(s): "FREET3" Anemia work up No results for input(s): "VITAMINB12", "FOLATE", "FERRITIN", "TIBC", "IRON", "RETICCTPCT" in the last 72 hours. Urinalysis    Component Value Date/Time   COLORURINE YELLOW 10/19/2023 1654   APPEARANCEUR CLEAR  10/19/2023 1654   LABSPEC 1.034 (H) 10/19/2023 1654   PHURINE 5.0 10/19/2023 1654   GLUCOSEU NEGATIVE 10/19/2023 1654   HGBUR NEGATIVE 10/19/2023 1654   BILIRUBINUR NEGATIVE 10/19/2023 1654   KETONESUR NEGATIVE 10/19/2023 1654   PROTEINUR NEGATIVE 10/19/2023 1654   NITRITE NEGATIVE 10/19/2023 1654   LEUKOCYTESUR NEGATIVE 10/19/2023 1654   Sepsis Labs Recent Labs  Lab 10/19/23 1220 10/20/23 0444 10/21/23 0450 10/22/23  0456  WBC 5.1 2.6* 2.5* 1.8*   Microbiology Recent Results (from the past 240 hours)  Gastrointestinal Panel by PCR , Stool     Status: Abnormal   Collection Time: 10/13/23  5:47 AM   Specimen: Rectum; Stool  Result Value Ref Range Status   Campylobacter species NOT DETECTED NOT DETECTED Final   Plesimonas shigelloides NOT DETECTED NOT DETECTED Final   Salmonella species NOT DETECTED NOT DETECTED Final   Yersinia enterocolitica NOT DETECTED NOT DETECTED Final   Vibrio species DETECTED (A) NOT DETECTED Final    Comment: RESULT CALLED TO, READ BACK BY AND VERIFIED WITH: STEPHANIE WRENN RN @0320  10/14/23 ASW    Vibrio cholerae NOT DETECTED NOT DETECTED Final   Enteroaggregative E coli (EAEC) NOT DETECTED NOT DETECTED Final   Enteropathogenic E coli (EPEC) NOT DETECTED NOT DETECTED Final   Enterotoxigenic E coli (ETEC) NOT DETECTED NOT DETECTED Final   Shiga like toxin producing E coli (STEC) NOT DETECTED NOT DETECTED Final   Shigella/Enteroinvasive E coli (EIEC) NOT DETECTED NOT DETECTED Final   Cryptosporidium NOT DETECTED NOT DETECTED Final   Cyclospora cayetanensis NOT DETECTED NOT DETECTED Final   Entamoeba histolytica NOT DETECTED NOT DETECTED Final   Giardia lamblia NOT DETECTED NOT DETECTED Final   Adenovirus F40/41 NOT DETECTED NOT DETECTED Final   Astrovirus NOT DETECTED NOT DETECTED Final   Norovirus GI/GII NOT DETECTED NOT DETECTED Final   Rotavirus A NOT DETECTED NOT DETECTED Final   Sapovirus (I, II, IV, and V) NOT DETECTED NOT DETECTED Final     Comment: Performed at [facility], [address] [place], [place] [address]  Gastrointestinal Panel by PCR , Stool     Status: None   Collection Time: 10/20/23  3:08 PM   Specimen: STOOL  Result Value Ref Range Status   Campylobacter species NOT DETECTED NOT DETECTED Final   Plesimonas shigelloides NOT DETECTED NOT DETECTED Final   Salmonella species NOT DETECTED NOT DETECTED Final   Yersinia enterocolitica NOT DETECTED NOT DETECTED Final   Vibrio species NOT DETECTED NOT DETECTED Final   Vibrio cholerae NOT DETECTED NOT DETECTED Final   Enteroaggregative E coli (EAEC) NOT DETECTED NOT DETECTED Final   Enteropathogenic E coli (EPEC) NOT DETECTED NOT DETECTED Final   Enterotoxigenic E coli (ETEC) NOT DETECTED NOT DETECTED Final   Shiga like toxin producing E coli (STEC) NOT DETECTED NOT DETECTED Final   Shigella/Enteroinvasive E coli (EIEC) NOT DETECTED NOT DETECTED Final   Cryptosporidium NOT DETECTED NOT DETECTED Final   Cyclospora cayetanensis NOT DETECTED NOT DETECTED Final   Entamoeba histolytica NOT DETECTED NOT DETECTED Final   Giardia lamblia NOT DETECTED NOT DETECTED Final   Adenovirus F40/41 NOT DETECTED NOT DETECTED Final   Astrovirus NOT DETECTED NOT DETECTED Final   Norovirus GI/GII NOT DETECTED NOT DETECTED Final   Rotavirus A NOT DETECTED NOT DETECTED Final   Sapovirus (I, II, IV, and V) NOT DETECTED NOT DETECTED Final    Comment: Performed at [facility], [address] [place], [place] [address]     Time coordinating discharge: 35 minutes  SIGNED:   Vada Garibaldi, MD  [facility] 10/22/2023, 1:47 PM

## 2023-10-22 NOTE — Progress Notes (Signed)
   Subjective/Chief Complaint: Tolerating soft diet for breakfast this morning.  Last BM earlier this morning.  A bit more formed today than yesterday.   Objective: Vital signs in last 24 hours: Temp:  [97.7 F (36.5 C)-97.8 F (36.6 C)] 97.8 F (36.6 C) (05/01 0607) Pulse Rate:  [58-67] 58 (05/01 0607) Resp:  [16] 16 (05/01 0607) BP: (121-130)/(57-65) 130/65 (05/01 0607) SpO2:  [97 %-98 %] 98 % (05/01 0607) Last BM Date : 10/21/23  Intake/Output from previous day: 04/30 0701 - 05/01 0700 In: 2056.1 [P.O.:920; I.V.:1136.1] Out: 150 [Urine:150] Intake/Output this shift: Total I/O In: 240 [P.O.:240] Out: -   Exam: Awake and alert Abdomen soft, minimally tender in central lower pelvis, nondistended  Lab Results:  Recent Labs    10/21/23 0450 10/22/23 0456  WBC 2.5* 1.8*  HGB 10.1* 10.7*  HCT 30.9* 32.5*  PLT 105* 102*   BMET Recent Labs    10/21/23 0450 10/22/23 0456  NA 135 134*  K 3.8 3.6  CL 105 102  CO2 23 25  GLUCOSE 89 92  BUN 13 8  CREATININE 1.24* 1.15*  CALCIUM 8.7* 8.7*   PT/INR No results for input(s): "LABPROT", "INR" in the last 72 hours. ABG No results for input(s): "PHART", "HCO3" in the last 72 hours.  Invalid input(s): "PCO2", "PO2"  Studies/Results: DG Abd Portable 1V-Small Bowel Obstruction Protocol-initial, 8 hr delay Result Date: 10/20/2023 CLINICAL DATA:  Right lower quadrant abdominal pain EXAM: PORTABLE ABDOMEN - 1 VIEW COMPARISON:  None Available. FINDINGS: Administered oral contrast opacifies a nondistended colon rectal vault. Normal abdominal gas pattern. No gross free intraperitoneal gas. Subdiaphragmatic region is excluded view. No nephro or urolithiasis. No acut bone abnormality e IMPRESSION: 1. Nonobstructive bowel gas pattern. Electronically Signed   By: Worthy Heads M.D.   On: 10/20/2023 19:25    Anti-infectives: Anti-infectives (From admission, onward)    None       Assessment/Plan: SBO vs  enteritis  -small bowel protocol films shows all the contrast is in the colon and there is no dilated bowel. -tolerating soft diet and having bowel function -surgically stable to DC home later today if continues to tolerate her soft diet. -d/w primary service.   LOS: 3 days    Leone Ralphs 10/22/2023

## 2023-10-27 LAB — MISCELLANEOUS TEST

## 2023-11-03 ENCOUNTER — Inpatient Hospital Stay: Attending: Hematology | Admitting: Hematology

## 2023-11-03 DIAGNOSIS — C7931 Secondary malignant neoplasm of brain: Secondary | ICD-10-CM | POA: Insufficient documentation

## 2023-11-03 DIAGNOSIS — Z7982 Long term (current) use of aspirin: Secondary | ICD-10-CM | POA: Insufficient documentation

## 2023-11-03 DIAGNOSIS — C3412 Malignant neoplasm of upper lobe, left bronchus or lung: Secondary | ICD-10-CM | POA: Diagnosis not present

## 2023-11-03 DIAGNOSIS — Z79899 Other long term (current) drug therapy: Secondary | ICD-10-CM | POA: Insufficient documentation

## 2023-11-03 NOTE — Progress Notes (Signed)
 [facility]   [phone]) [phone] [phone]) [phone]   Clinic Follow up Note   Patient Care Team: [name], PA-C as PCP - General (Family Medicine) Sonja Roberts, MD as Consulting Physician (Hematology and Oncology) Kenith Payer, MD as Consulting Physician (Radiation Oncology) 11/03/2023  I connected with Abigail Golden on 11/03/23 at  8:30 AM EDT by telephone and verified that I am speaking with the correct person using two identifiers.   I discussed the limitations, risks, security and privacy concerns of performing an evaluation and management service by telephone and the availability of in person appointments. I also discussed with the patient that there may be a patient responsible charge related to this service. The patient expressed understanding and agreed to proceed.   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 She tolerates moderately well with mucositis and skin toxicities.   -she had thoracic radiation (The primary tumor and involved mediastinal adenopathy were treated to 66 Gy in 33 fractions of 2 Gy), completed on 11/04/2021 - her recent restaging CT scan from May 22, 2022 showed continued response in the primary site and thoracic adenopathy, or new lesions. -She is clinically doing well, lives independently, tolerating Tagrisso  well, will continue. - restaging CT from 03/19/2023 showed post radiation change in lung and no evidence of recurrence. Will continue Tagrisso   -she we will continue follow-up with his radiation oncologist Dr. Lorri Rota for her brain metastasis, as single fraction SRS was recommended based on her recent brain MRI from  March 18, 2023 -restaging CT CAP 06/22/2023 showed stable diease, no progression   Assessment & Plan Non-small cell lung cancer Non-small cell lung cancer is being managed with Tagrisso . The recent PET scan shows clear lungs and no metastasis to other organs, indicating a positive response to treatment. - Continue Tagrisso  - Schedule brain MRI in July  Small bowel obstruction Recent hospitalization for small bowel obstruction, likely related to previous surgery. Symptoms have improved with dietary adjustments, and she is gradually reintroducing food into her diet, eating smaller portions more frequently.  Diarrhea due to Vibrio infection Previously diagnosed with Vibrio infection, which has resolved as indicated by negative stool tests. Current loose stools may be related to dietary factors or Tagrisso , which is known to cause diarrhea. She is monitoring her diet to manage symptoms.  Plan - She is recovering well from her recent hospital admission for small bowel obstruction - Continue Tagrisso  - Next follow-up scheduled in June   SUMMARY OF ONCOLOGIC HISTORY: Oncology History Overview Note   Cancer Staging  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) Staging form: Lung, AJCC 8th Edition - Clinical stage from 04/24/2021: Stage IVA (cT2, cN2, pM1b) - Signed by Sonja Shiloh, MD on 05/14/2021    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)  04/22/2021 Imaging   CT HEAD WO CONTRAST   IMPRESSION: Two separate areas of vasogenic edema within the left hemisphere, 1 within the inferior frontal region in the other at the left parietal vertex. Metastatic disease would be the most likely cause of this appearance. Other possibilities include venous infarctions and cerebritis. MRI with contrast recommended.    04/23/2021 Imaging   MR Brain W  and Wo Contrast  IMPRESSION: 1. Metastatic disease to the brain. Two enhancing brain masses with moderate associated vasogenic  edema: solid 16 mm left parietal lobe mass, and a larger 29 mm rim enhancing cystic or necrotic mass in the left inferior frontal gyrus. Two other small 2-3 mm metastases in the right inferior frontal gyrus, right posterior temporal lobe. And questionable two additional punctate metastases in both superior frontal gyri abutting the falx (series 18, image 46). 2. No significant intracranial mass effect. No other intracranial abnormality.   04/23/2021 Imaging   CT Chest W Contrast  IMPRESSION: 4.5 cm spiculated left upper lobe mass most compatible with primary lung cancer. This abuts the medial pleural surface. A few small adjacent pre-vascular mediastinal lymph nodes, none pathologically enlarged. Recommend cardiothoracic surgical and oncologic consultation.   No acute findings or evidence of metastatic disease in the abdomen or pelvis.   04/24/2021 Cancer Staging   Staging form: Lung, AJCC 8th Edition - Clinical stage from 04/24/2021: Stage IVA (cT2, cN2, pM1b) - Signed by Sonja Ringling, MD on 05/14/2021   04/24/2021 Initial Biopsy   FINAL MICROSCOPIC DIAGNOSIS:   A. LUNG, LUL, FINE NEEDLE ASPIRATION:  - Malignant cells present, consistent with adenocarcinoma   B. LUNG, LUL, BRUSHING:  - Malignant cells consistent with adenocarcinoma, see comment    COMMENT:  B.  Immunohistochemical stains show that the tumor cells are positive for TTF-1 while they are negative for p63 and CK5/6, consistent with above interpretation. The number of tumor cells appears to be insufficient for molecular studies.   05/03/2021 Initial Diagnosis   Lung cancer (HCC)   05/08/2021 - 05/13/2021 Radiation Therapy   SRS treatment to the metastatic brain lesions under the care of Dr. Lorri Rota    08/05/2021 Imaging   EXAM: CT CHEST, ABDOMEN, AND PELVIS WITH CONTRAST  IMPRESSION: 1. No substantial change in size of the left upper lobe pulmonary lesion. 2. Borderline to mildly enlarged adjacent prevascular/AP  window lymph nodes have increased in size in the interval. This finding raises concern for disease progression. 3. Interval development of upper normal bilateral common iliac and left external iliac lymph nodes. Close attention on follow-up recommended. 4. No other evidence for metastatic disease in the abdomen or pelvis.   09/22/2022 Imaging    IMPRESSION: 1. Area of chronic postradiation mass-like fibrosis in the posteromedial aspect of the left upper lung, without definitive findings of locally recurrent disease or definite metastatic disease in the chest, abdomen or pelvis. 2. Mild atherosclerosis. 3. Additional incidental findings, as above.     03/19/2023 Imaging   CT chest,abdomen, and pelvis with contrast IMPRESSION: 1. Unchanged appearance of posterior paramedian left upper lobe with dense post treatment fibrosis and volume loss. 2. No evidence of recurrent or metastatic disease in the chest, abdomen, or pelvis. 3. Mildly coarse contour of the liver, suggestive of cirrhosis. Correlate with biochemical findings. 4. Status post cholecystectomy and hysterectomy.  Aortic Atherosclerosis (ICD10-      Discussed the use of AI scribe software for clinical note transcription with the patient, who gave verbal consent to proceed.  History of Present Illness Abigail Golden "Tyra Galley" is a 58 year old female with non-small cell lung cancer who presents for follow-up after recent hospital admission.  She was hospitalized for a small bowel obstruction, potentially related to previous surgery, and discharged on Oct 22, 2023. During hospitalization, she was diagnosed with a Vibrio species infection, which resolved by discharge.  Since discharge, she experiences intermittent abdominal pain  and is gradually reintroducing food, eating smaller portions every two to three hours. She resumed Tagrisso  (osimertinib ) on Oct 23, 2023, after a brief interruption. She experiences intermittent diarrhea,  with loose bowel movements, exacerbated by greasy foods. No constipation is present.  A recent PET scan shows her lung is clear with no signs of cancer in other organs.     REVIEW OF SYSTEMS:   Constitutional: Denies fevers, chills or abnormal weight loss Eyes: Denies blurriness of vision Ears, nose, mouth, throat, and face: Denies mucositis or sore throat Respiratory: Denies cough, dyspnea or wheezes Cardiovascular: Denies palpitation, chest discomfort or lower extremity swelling Gastrointestinal:  Denies nausea, heartburn or change in bowel habits Skin: Denies abnormal skin rashes Lymphatics: Denies new lymphadenopathy or easy bruising Neurological:Denies numbness, tingling or new weaknesses Behavioral/Psych: Mood is stable, no new changes  All other systems were reviewed with the patient and are negative.  MEDICAL HISTORY:  Past Medical History:  Diagnosis Date   Allergy    Anxiety    Chronic bronchitis (HCC)    Depression    Headache    Hypoglycemia    Lower back pain    since fall ~ 2012 (05/25/2015)   Migraine    05/25/2015 "probably once/month"   Migraine headache    Pain in left hip    since fall ~ 2012 (05/25/2015)   Pneumonia "several times"    SURGICAL HISTORY: Past Surgical History:  Procedure Laterality Date   ABDOMINAL HYSTERECTOMY  11/2002   "found benign tumors"   BRONCHIAL BIOPSY  04/24/2021   Procedure: BRONCHIAL BIOPSIES;  Surgeon: Prudy Brownie, DO;  Location: [facility];  Service: Pulmonary;;   BRONCHIAL BRUSHINGS  04/24/2021   Procedure: BRONCHIAL BRUSHINGS;  Surgeon: Prudy Brownie, DO;  Location: [facility];  Service: Pulmonary;;   BRONCHIAL NEEDLE ASPIRATION BIOPSY  04/24/2021   Procedure: BRONCHIAL NEEDLE ASPIRATION BIOPSIES;  Surgeon: Prudy Brownie, DO;  Location: [facility];  Service: Pulmonary;;   CHEST TUBE INSERTION  04/24/2021   Procedure: CHEST TUBE INSERTION;  Surgeon: Prudy Brownie, DO;  Location: [facility];  Service:  Pulmonary;;   CHOLECYSTECTOMY N/A 05/25/2015   Procedure: LAPAROSCOPIC CHOLECYSTECTOMY;  Surgeon: Shela Derby, MD;  Location: [facility] OR;  Service: General;  Laterality: N/A;   LAPAROSCOPIC CHOLECYSTECTOMY  05/25/2015   MENISCUS REPAIR Right 03/2017   VIDEO BRONCHOSCOPY WITH ENDOBRONCHIAL NAVIGATION N/A 04/24/2021   Procedure: VIDEO BRONCHOSCOPY WITH ENDOBRONCHIAL NAVIGATION;  Surgeon: Prudy Brownie, DO;  Location: [facility];  Service: Pulmonary;  Laterality: N/A;   VIDEO BRONCHOSCOPY WITH RADIAL ENDOBRONCHIAL ULTRASOUND  04/24/2021   Procedure: VIDEO BRONCHOSCOPY WITH RADIAL ENDOBRONCHIAL ULTRASOUND;  Surgeon: Prudy Brownie, DO;  Location: [facility];  Service: Pulmonary;;    I have reviewed the social history and family history with the patient and they are unchanged from previous note.  ALLERGIES:  is allergic to celebrex [celecoxib] and minocycline.  MEDICATIONS:  Current Outpatient Medications  Medication Sig Dispense Refill   acetaminophen  (TYLENOL ) 500 MG tablet Take 500 mg by mouth every 6 (six) hours as needed for moderate pain or headache.     albuterol  (VENTOLIN  HFA) 108 (90 Base) MCG/ACT inhaler Inhale 2 puffs into the lungs every 4 (four) hours as needed.     ALPRAZolam  (XANAX ) 0.25 MG tablet Take 0.25 mg by mouth every 4 (four) hours as needed for anxiety. for anxiety     aspirin EC 81 MG tablet Take 81 mg by mouth daily. Swallow whole.  citalopram  (CELEXA ) 10 MG tablet Take 10 mg by mouth at bedtime.     Diclofenac Potassium,Migraine, 50 MG PACK Take 1 each by mouth as needed. As needed for migraines     diclofenac sodium (VOLTAREN) 1 % GEL Apply topically as needed. To affected area     diphenhydrAMINE  (BENADRYL ) 25 MG tablet Take 25 mg by mouth See admin instructions. 25 mg at bedtime 25 mg as needed for itching     Docusate Sodium  (STOOL SOFTENER LAXATIVE PO) Take 1 tablet by mouth as needed.     eletriptan (RELPAX) 20 MG tablet Take 20 mg by mouth as needed for  headache (at onset of migraine.). May repeat in 2 hours if needed.     fluticasone (FLONASE) 50 MCG/ACT nasal spray Place 2 sprays into both nostrils daily as needed for allergies.     HYDROcodone -acetaminophen  (NORCO/VICODIN) 5-325 MG tablet Take 1 tablet by mouth every 6 (six) hours as needed for moderate pain (pain score 4-6).     hydrOXYzine  (ATARAX /VISTARIL ) 25 MG tablet Take 25 mg by mouth See admin instructions. 25 mg at bedtime 25 mg during the day as needed for itching/anxiety     loratadine  (CLARITIN ) 10 MG tablet Take 10 mg by mouth daily.     montelukast  (SINGULAIR ) 10 MG tablet Take 10 mg by mouth at bedtime.     nystatin  powder APPLY  POWDER TOPICALLY TO AFFECTED AREA THREE TIMES DAILY (Patient taking differently: Apply 1 Application topically as needed. APPLY  POWDER TOPICALLY TO AFFECTED AREA THREE TIMES DAILY) 60 g 1   ondansetron  (ZOFRAN -ODT) 4 MG disintegrating tablet DISSOLVE 1 TABLET IN MOUTH EVERY 8 HOURS AS NEEDED FOR NAUSEA AND VOMITING 18 tablet 1   osimertinib  mesylate (TAGRISSO ) 80 MG tablet Take 1 tablet (80 mg total) by mouth daily. 30 tablet 3   prochlorperazine  (COMPAZINE ) 10 MG tablet TAKE 1 TABLET BY MOUTH EVERY 6 HOURS AS NEEDED FOR NAUSEA FOR VOMITING 30 tablet 1   solifenacin (VESICARE) 10 MG tablet Take 10 mg by mouth every evening.     triamcinolone cream (KENALOG) 0.1 % Apply 1 Application topically 2 (two) times daily.     zolpidem  (AMBIEN ) 10 MG tablet TAKE 1/2 TO 1 (ONE-HALF TO ONE) TABLET BY MOUTH AT BEDTIME AS NEEDED FOR SLEEP 60 tablet 0   No current facility-administered medications for this visit.    PHYSICAL EXAMINATION: Not performed   LABORATORY DATA:  I have reviewed the data as listed    Latest Ref Rng & Units 10/22/2023    4:56 AM 10/21/2023    4:50 AM 10/20/2023    4:44 AM  CBC  WBC 4.0 - 10.5 K/uL 1.8  2.5  2.6   Hemoglobin 12.0 - 15.0 g/dL 28.4  13.2  44.0   Hematocrit 36.0 - 46.0 % 32.5  30.9  32.3   Platelets 150 - 400 K/uL 102   105  122         Latest Ref Rng & Units 10/22/2023    4:56 AM 10/21/2023    4:50 AM 10/20/2023    4:44 AM  CMP  Glucose 70 - 99 mg/dL 92  89  102   BUN 6 - 20 mg/dL 8  13  15    Creatinine 0.44 - 1.00 mg/dL 7.25  3.66  4.40   Sodium 135 - 145 mmol/L 134  135  136   Potassium 3.5 - 5.1 mmol/L 3.6  3.8  4.0   Chloride 98 - 111 mmol/L  102  105  105   CO2 22 - 32 mmol/L 25  23  23    Calcium 8.9 - 10.3 mg/dL 8.7  8.7  8.9   Total Protein 6.5 - 8.1 g/dL 6.1     Total Bilirubin 0.0 - 1.2 mg/dL 0.7     Alkaline Phos 38 - 126 U/L 83     AST 15 - 41 U/L 63     ALT 0 - 44 U/L 45         RADIOGRAPHIC STUDIES: I have personally reviewed the radiological images as listed and agreed with the findings in the report. No results found.     I discussed the assessment and treatment plan with the patient. The patient was provided an opportunity to ask questions and all were answered. The patient agreed with the plan and demonstrated an understanding of the instructions.   The patient was advised to call back or seek an in-person evaluation if the symptoms worsen or if the condition fails to improve as anticipated.  I provided 15 minutes of non face-to-face telephone visit time during this encounter, including review of chart and various tests results, discussions about plan of care and coordination of care plan.    Sonja Weaubleau, MD 11/03/23

## 2023-11-03 NOTE — Assessment & Plan Note (Signed)
 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 She tolerates moderately well with mucositis and skin toxicities.   -she had thoracic radiation (The primary tumor and involved mediastinal adenopathy were treated to 66 Gy in 33 fractions of 2 Gy), completed on 11/04/2021 - her recent restaging CT scan from May 22, 2022 showed continued response in the primary site and thoracic adenopathy, or new lesions. -She is clinically doing well, lives independently, tolerating Tagrisso well, will continue. - restaging CT from 03/19/2023 showed post radiation change in lung and no evidence of recurrence. Will continue Tagrisso  -she we will continue follow-up with his radiation oncologist Dr. Kathrynn Running for her brain metastasis, as single fraction SRS was recommended based on her recent brain MRI from March 18, 2023 -restaging CT CAP 06/22/2023 showed stable diease, no progression

## 2023-11-08 NOTE — Progress Notes (Signed)
 Patient had GI symptoms

## 2023-11-10 ENCOUNTER — Telehealth: Payer: Self-pay

## 2023-11-10 NOTE — Telephone Encounter (Signed)
 LVM regarding pt disability forms being completed ,faxed, and confirmation received. No questions or concerns at this time.

## 2023-11-11 ENCOUNTER — Telehealth: Payer: Self-pay

## 2023-11-11 ENCOUNTER — Other Ambulatory Visit: Payer: Self-pay

## 2023-11-11 ENCOUNTER — Encounter: Payer: Self-pay | Admitting: Hematology

## 2023-11-11 NOTE — Telephone Encounter (Signed)
 2nd phone call was made to the pt regarding her Disability forms being completed. Pt verbalized understanding.

## 2023-11-11 NOTE — Progress Notes (Signed)
 Specialty Pharmacy Refill Coordination Note  Abigail Golden is a 58 y.o. female contacted today regarding refills of specialty medication(s) Osimertinib  Mesylate (TAGRISSO )   Patient requested (Patient-Rptd) Pickup at [facility] date: (Patient-Rptd) 11/26/23   Medication will be filled on 11/25/23.

## 2023-11-25 ENCOUNTER — Other Ambulatory Visit: Payer: Self-pay

## 2023-11-25 DIAGNOSIS — C7931 Secondary malignant neoplasm of brain: Secondary | ICD-10-CM

## 2023-11-26 ENCOUNTER — Other Ambulatory Visit (HOSPITAL_COMMUNITY): Payer: Self-pay

## 2023-11-26 ENCOUNTER — Encounter: Payer: Self-pay | Admitting: Hematology

## 2023-11-26 ENCOUNTER — Inpatient Hospital Stay

## 2023-11-26 ENCOUNTER — Other Ambulatory Visit: Payer: Self-pay

## 2023-11-26 ENCOUNTER — Inpatient Hospital Stay: Attending: Hematology | Admitting: Hematology

## 2023-11-26 VITALS — BP 130/60 | HR 85 | Temp 97.9°F | Resp 19 | Ht 61.0 in | Wt 260.1 lb

## 2023-11-26 DIAGNOSIS — C3412 Malignant neoplasm of upper lobe, left bronchus or lung: Secondary | ICD-10-CM | POA: Diagnosis present

## 2023-11-26 DIAGNOSIS — C7931 Secondary malignant neoplasm of brain: Secondary | ICD-10-CM

## 2023-11-26 DIAGNOSIS — Z923 Personal history of irradiation: Secondary | ICD-10-CM | POA: Diagnosis not present

## 2023-11-26 LAB — CMP (CANCER CENTER ONLY)
ALT: 11 U/L (ref 0–44)
AST: 19 U/L (ref 15–41)
Albumin: 4.2 g/dL (ref 3.5–5.0)
Alkaline Phosphatase: 65 U/L (ref 38–126)
Anion gap: 5 (ref 5–15)
BUN: 23 mg/dL — ABNORMAL HIGH (ref 6–20)
CO2: 26 mmol/L (ref 22–32)
Calcium: 9.3 mg/dL (ref 8.9–10.3)
Chloride: 104 mmol/L (ref 98–111)
Creatinine: 1.23 mg/dL — ABNORMAL HIGH (ref 0.44–1.00)
GFR, Estimated: 52 mL/min — ABNORMAL LOW (ref >60)
Glucose, Bld: 93 mg/dL (ref 70–99)
Potassium: 5 mmol/L (ref 3.5–5.1)
Sodium: 135 mmol/L (ref 135–145)
Total Bilirubin: 0.4 mg/dL (ref 0.0–1.2)
Total Protein: 7.1 g/dL (ref 6.5–8.1)

## 2023-11-26 LAB — CBC WITH DIFFERENTIAL (CANCER CENTER ONLY)
Abs Immature Granulocytes: 0 10*3/uL (ref 0.00–0.07)
Basophils Absolute: 0 10*3/uL (ref 0.0–0.1)
Basophils Relative: 1 %
Eosinophils Absolute: 0 10*3/uL (ref 0.0–0.5)
Eosinophils Relative: 0 %
HCT: 33.8 % — ABNORMAL LOW (ref 36.0–46.0)
Hemoglobin: 11.4 g/dL — ABNORMAL LOW (ref 12.0–15.0)
Immature Granulocytes: 0 %
Lymphocytes Relative: 34 %
Lymphs Abs: 1 10*3/uL (ref 0.7–4.0)
MCH: 28.4 pg (ref 26.0–34.0)
MCHC: 33.7 g/dL (ref 30.0–36.0)
MCV: 84.3 fL (ref 80.0–100.0)
Monocytes Absolute: 0.4 10*3/uL (ref 0.1–1.0)
Monocytes Relative: 14 %
Neutro Abs: 1.6 10*3/uL — ABNORMAL LOW (ref 1.7–7.7)
Neutrophils Relative %: 51 %
Platelet Count: 162 10*3/uL (ref 150–400)
RBC: 4.01 MIL/uL (ref 3.87–5.11)
RDW: 13.6 % (ref 11.5–15.5)
WBC Count: 3 10*3/uL — ABNORMAL LOW (ref 4.0–10.5)
nRBC: 0 % (ref 0.0–0.2)

## 2023-11-26 MED ORDER — ZOLPIDEM TARTRATE 10 MG PO TABS: ORAL_TABLET | ORAL | 1 refills | Status: DC

## 2023-11-26 MED ORDER — OSIMERTINIB MESYLATE 80 MG PO TABS
80.0000 mg | ORAL_TABLET | Freq: Every day | ORAL | 3 refills | Status: DC
Start: 2023-11-26 — End: 2024-04-05
  Filled 2023-12-14: qty 30, 30d supply, fill #0
  Filled 2024-01-19: qty 30, 30d supply, fill #1
  Filled 2024-02-23 – 2024-02-25 (×2): qty 30, 30d supply, fill #2
  Filled 2024-03-31: qty 30, 30d supply, fill #3

## 2023-11-26 NOTE — Assessment & Plan Note (Signed)
 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 She tolerates moderately well with mucositis and skin toxicities.   -she had thoracic radiation (The primary tumor and involved mediastinal adenopathy were treated to 66 Gy in 33 fractions of 2 Gy), completed on 11/04/2021 - her recent restaging CT scan from May 22, 2022 showed continued response in the primary site and thoracic adenopathy, or new lesions. -She is clinically doing well, lives independently, tolerating Tagrisso  well, will continue. - restaging CT from 03/19/2023 showed post radiation change in lung and no evidence of recurrence. Will continue Tagrisso   -she we will continue follow-up with his radiation oncologist Dr. Lorri Rota for her brain metastasis, as single fraction SRS was recommended based on her recent brain MRI from March 18, 2023 -restaging CT CAP 06/22/2023 showed stable diease, no progression  -PET 10/16/2023 showed SD

## 2023-11-27 ENCOUNTER — Encounter: Payer: Self-pay | Admitting: Hematology

## 2023-11-27 NOTE — Progress Notes (Addendum)
 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 She tolerates moderately well with mucositis and skin toxicities.   -she had thoracic radiation (The primary tumor and involved mediastinal adenopathy were treated to 66 Gy in 33 fractions of 2 Gy), completed on 11/04/2021 - her recent restaging CT scan from May 22, 2022 showed continued response in the primary site and thoracic adenopathy, or new lesions. -She is clinically doing well, lives independently, tolerating Tagrisso  well, will continue. - restaging CT from 03/19/2023 showed post radiation change in lung and no evidence of recurrence. Will continue Tagrisso   -she we will continue follow-up with his radiation oncologist Dr. Lorri Golden for her brain metastasis, as single fraction SRS was recommended based on her recent brain MRI from March 18, 2023 -restaging CT CAP 06/22/2023 showed stable diease, no progression  -PET 10/16/2023 showed SD  Assessment & Plan Non-Small cell lung cancer, stage IV Well-managed small cell lung cancer with no new symptoms. Last CT scan in April showed favorable results. Plan to repeat CT scan in early August unless there is questionable progression, in which case a PET scan will be considered. She continues Tagrisso  therapy with no new adverse effects reported. - Continue Tagrisso  therapy - Order CT scan in early  August - Consider PET scan if CT shows questionable progression  Diarrhea Intermittent loose stools, possibly related to dietary changes and Tagrisso . She is slowly reintroducing foods into her diet. Emphasized ensuring adequate hydration. - Ensure adequate hydration - Monitor dietary intake and adjust as needed  Joint and muscle pain Joint and muscle pain, particularly in knees and hips, possibly related to Tagrisso . Pain is managed with Tylenol  arthritis. Consideration of physical therapy to strengthen muscles. - Consider referral to physical therapy - Continue Tylenol  arthritis as needed  Epistaxis Occasional epistaxis, possibly related to previous sinus infection. No recent severe bleeding. She uses a brown paper bag technique to manage epistaxis. - Advise pressure application for severe bleeding  Plan - She is overall stable, labs reviewed. - Continue Tagrisso   -f/u in mid August with lab and CT one week prior    SUMMARY OF ONCOLOGIC HISTORY: Oncology History Overview Note   Cancer Staging  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) Staging form: Lung, AJCC 8th Edition - Clinical stage from 04/24/2021: Stage IVA (cT2, cN2, pM1b) - Signed by Abigail [place], MD on 05/14/2021    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)  04/22/2021 Imaging   CT HEAD WO CONTRAST   IMPRESSION: Two separate areas of vasogenic edema within the left hemisphere, 1 within the inferior frontal region in the other at the left parietal vertex. Metastatic disease would be the most likely cause of this appearance. Other possibilities include venous infarctions and cerebritis. MRI with contrast recommended.    04/23/2021 Imaging   MR Brain W and Wo Contrast  IMPRESSION: 1. Metastatic disease to the brain. Two enhancing brain masses with moderate associated  vasogenic edema: solid 16 mm left parietal lobe mass, and a larger 29 mm rim enhancing cystic or necrotic mass in the left  inferior frontal gyrus. Two other small 2-3 mm metastases in the right inferior frontal gyrus, right posterior temporal lobe. And questionable two additional punctate metastases in both superior frontal gyri abutting the falx (series 18, image 46). 2. No significant intracranial mass effect. No other intracranial abnormality.   04/23/2021 Imaging   CT Chest W Contrast  IMPRESSION: 4.5 cm spiculated left upper lobe mass most compatible with primary lung cancer. This abuts the medial pleural surface. A few small adjacent pre-vascular mediastinal lymph nodes, none pathologically enlarged. Recommend cardiothoracic surgical and oncologic consultation.   No acute findings or evidence of metastatic disease in the abdomen or pelvis.   04/24/2021 Cancer Staging   Staging form: Lung, AJCC 8th Edition - Clinical stage from 04/24/2021: Stage IVA (cT2, cN2, pM1b) - Signed by Abigail [place], MD on 05/14/2021   04/24/2021 Initial Biopsy   FINAL MICROSCOPIC DIAGNOSIS:   A. LUNG, LUL, FINE NEEDLE ASPIRATION:  - Malignant cells present, consistent with adenocarcinoma   B. LUNG, LUL, BRUSHING:  - Malignant cells consistent with adenocarcinoma, see comment    COMMENT:  B.  Immunohistochemical stains show that the tumor cells are positive for TTF-1 while they are negative for p63 and CK5/6, consistent with above interpretation. The number of tumor cells appears to be insufficient for molecular studies.   05/03/2021 Initial Diagnosis   Lung cancer (HCC)   05/08/2021 - 05/13/2021 Radiation Therapy   SRS treatment to the metastatic brain lesions under the care of Dr. Lorri Golden    08/05/2021 Imaging   EXAM: CT CHEST, ABDOMEN, AND PELVIS WITH CONTRAST  IMPRESSION: 1. No substantial change in size of the left upper lobe pulmonary lesion. 2. Borderline to mildly enlarged adjacent prevascular/AP window lymph nodes have increased in size in the interval. This finding raises concern for disease progression. 3.  Interval development of upper normal bilateral common iliac and left external iliac lymph nodes. Close attention on follow-up recommended. 4. No other evidence for metastatic disease in the abdomen or pelvis.   09/22/2022 Imaging    IMPRESSION: 1. Area of chronic postradiation mass-like fibrosis in the posteromedial aspect of the left upper lung, without definitive findings of locally recurrent disease or definite metastatic disease in the chest, abdomen or pelvis. 2. Mild atherosclerosis. 3. Additional incidental findings, as above.     03/19/2023 Imaging   CT chest,abdomen, and pelvis with contrast IMPRESSION: 1. Unchanged appearance of posterior paramedian left upper lobe with dense post treatment fibrosis and volume loss. 2. No evidence of recurrent or metastatic disease in the chest, abdomen, or pelvis. 3. Mildly coarse contour of the liver, suggestive of cirrhosis. Correlate with biochemical findings. 4. Status post cholecystectomy and hysterectomy.  Aortic Atherosclerosis (ICD10-       Discussed the use of AI scribe software for clinical note transcription with the patient, who gave verbal consent to proceed.  History of Present Illness Abigail Golden "Abigail Golden" is a 58 year old female with small cell lung cancer who presents for follow-up.  She recently experienced a bowel obstruction and an infection with Vibrio species, leading to dietary adjustments. She is gradually reintroducing foods and continues to have loose stools, though they are less severe. Her weight has fluctuated due to her recent illness, but she has regained some weight. She is cautious with her diet, recently reintroducing milk and consuming Jell-O, pudding, and shaved  ice.  She experiences joint and muscle pain, particularly in her knees and hips, which is more pronounced at night and affects her ability to move and turn in bed. She manages the pain with Tylenol , including Tylenol  Arthritis.  Her  respiratory status is stable, and she uses albuterol  as needed. No current pain or breathing issues are present.  She notes bruising on her arms, attributed to hospital tape and a recent minor injury. She has a history of epistaxis, particularly during sinus infections, but none recently.     All other systems were reviewed with the patient and are negative.  MEDICAL HISTORY:  Past Medical History:  Diagnosis Date   Allergy    Anxiety    Chronic bronchitis (HCC)    Depression    Headache    Hypoglycemia    Lower back pain    since fall ~ 2012 (05/25/2015)   Migraine    05/25/2015 "probably once/month"   Migraine headache    Pain in left hip    since fall ~ 2012 (05/25/2015)   Pneumonia "several times"    SURGICAL HISTORY: Past Surgical History:  Procedure Laterality Date   ABDOMINAL HYSTERECTOMY  11/2002   "found benign tumors"   BRONCHIAL BIOPSY  04/24/2021   Procedure: BRONCHIAL BIOPSIES;  Surgeon: Prudy Brownie, DO;  Location: [facility];  Service: Pulmonary;;   BRONCHIAL BRUSHINGS  04/24/2021   Procedure: BRONCHIAL BRUSHINGS;  Surgeon: Prudy Brownie, DO;  Location: [facility];  Service: Pulmonary;;   BRONCHIAL NEEDLE ASPIRATION BIOPSY  04/24/2021   Procedure: BRONCHIAL NEEDLE ASPIRATION BIOPSIES;  Surgeon: Prudy Brownie, DO;  Location: [facility];  Service: Pulmonary;;   CHEST TUBE INSERTION  04/24/2021   Procedure: CHEST TUBE INSERTION;  Surgeon: Prudy Brownie, DO;  Location: [facility];  Service: Pulmonary;;   CHOLECYSTECTOMY N/A 05/25/2015   Procedure: LAPAROSCOPIC CHOLECYSTECTOMY;  Surgeon: Shela Derby, MD;  Location: [facility];  Service: General;  Laterality: N/A;   LAPAROSCOPIC CHOLECYSTECTOMY  05/25/2015   MENISCUS REPAIR Right 03/2017   VIDEO BRONCHOSCOPY WITH ENDOBRONCHIAL NAVIGATION N/A 04/24/2021   Procedure: VIDEO BRONCHOSCOPY WITH ENDOBRONCHIAL NAVIGATION;  Surgeon: Prudy Brownie, DO;  Location: [facility];  Service: Pulmonary;  Laterality:  N/A;   VIDEO BRONCHOSCOPY WITH RADIAL ENDOBRONCHIAL ULTRASOUND  04/24/2021   Procedure: VIDEO BRONCHOSCOPY WITH RADIAL ENDOBRONCHIAL ULTRASOUND;  Surgeon: Prudy Brownie, DO;  Location: [facility];  Service: Pulmonary;;    I have reviewed the social history and family history with the patient and they are unchanged from previous note.  ALLERGIES:  is allergic to celebrex [celecoxib] and minocycline.  MEDICATIONS:  Current Outpatient Medications  Medication Sig Dispense Refill   acetaminophen  (TYLENOL ) 500 MG tablet Take 500 mg by mouth every 6 (six) hours as needed for moderate pain or headache.     albuterol  (VENTOLIN  HFA) 108 (90 Base) MCG/ACT inhaler Inhale 2 puffs into the lungs every 4 (four) hours as needed.     ALPRAZolam  (XANAX ) 0.25 MG tablet Take 0.25 mg by mouth every 4 (four) hours as needed for anxiety. for anxiety     aspirin EC 81 MG tablet Take 81 mg by mouth daily. Swallow whole.     citalopram  (CELEXA ) 10 MG tablet Take 10 mg by mouth at bedtime.     Diclofenac Potassium,Migraine, 50 MG PACK Take 1 each by mouth as needed. As needed for migraines     diclofenac sodium (VOLTAREN) 1 % GEL Apply topically as needed. To affected area  diphenhydrAMINE  (BENADRYL ) 25 MG tablet Take 25 mg by mouth See admin instructions. 25 mg at bedtime 25 mg as needed for itching     Docusate Sodium  (STOOL SOFTENER LAXATIVE PO) Take 1 tablet by mouth as needed.     eletriptan (RELPAX) 20 MG tablet Take 20 mg by mouth as needed for headache (at onset of migraine.). May repeat in 2 hours if needed.     fluticasone (FLONASE) 50 MCG/ACT nasal spray Place 2 sprays into both nostrils daily as needed for allergies.     HYDROcodone -acetaminophen  (NORCO/VICODIN) 5-325 MG tablet Take 1 tablet by mouth every 6 (six) hours as needed for moderate pain (pain score 4-6).     hydrOXYzine  (ATARAX /VISTARIL ) 25 MG tablet Take 25 mg by mouth See admin instructions. 25 mg at bedtime 25 mg during the day as  needed for itching/anxiety     loratadine  (CLARITIN ) 10 MG tablet Take 10 mg by mouth daily.     montelukast  (SINGULAIR ) 10 MG tablet Take 10 mg by mouth at bedtime.     nystatin  powder APPLY  POWDER TOPICALLY TO AFFECTED AREA THREE TIMES DAILY (Patient taking differently: Apply 1 Application topically as needed. APPLY  POWDER TOPICALLY TO AFFECTED AREA THREE TIMES DAILY) 60 g 1   ondansetron  (ZOFRAN -ODT) 4 MG disintegrating tablet DISSOLVE 1 TABLET IN MOUTH EVERY 8 HOURS AS NEEDED FOR NAUSEA AND VOMITING 18 tablet 1   osimertinib  mesylate (TAGRISSO ) 80 MG tablet Take 1 tablet (80 mg total) by mouth daily. 30 tablet 3   prochlorperazine  (COMPAZINE ) 10 MG tablet TAKE 1 TABLET BY MOUTH EVERY 6 HOURS AS NEEDED FOR NAUSEA FOR VOMITING 30 tablet 1   solifenacin (VESICARE) 10 MG tablet Take 10 mg by mouth every evening.     triamcinolone cream (KENALOG) 0.1 % Apply 1 Application topically 2 (two) times daily.     zolpidem  (AMBIEN ) 10 MG tablet TAKE 1/2 TO 1 (ONE-HALF TO ONE) TABLET BY MOUTH AT BEDTIME AS NEEDED FOR SLEEP 60 tablet 1   No current facility-administered medications for this visit.    PHYSICAL EXAMINATION: ECOG PERFORMANCE STATUS: 1 - Symptomatic but completely ambulatory  Vitals:   11/26/23 1416  BP: 130/60  Pulse: 85  Resp: 19  Temp: 97.9 F (36.6 C)  SpO2: 97%   Wt Readings from Last 3 Encounters:  11/26/23 260 lb 1.6 oz (118 kg)  10/19/23 257 lb 11.5 oz (116.9 kg)  10/12/23 260 lb (117.9 kg)     GENERAL:alert, no distress and comfortable SKIN: skin color, texture, turgor are normal, no rashes or significant lesions EYES: normal, Conjunctiva are pink and non-injected, sclera clear NECK: supple, thyroid normal size, non-tender, without nodularity LYMPH:  no palpable lymphadenopathy in the cervical, axillary  LUNGS: clear to auscultation and percussion with normal breathing effort HEART: regular rate & rhythm and no murmurs and no lower extremity edema ABDOMEN:abdomen  soft, non-tender and normal bowel sounds Musculoskeletal:no cyanosis of digits and no clubbing  NEURO: alert & oriented x 3 with fluent speech, no focal motor/sensory deficits  Physical Exam SKIN: Bruising on arm.  LABORATORY DATA:  I have reviewed the data as listed    Latest Ref Rng & Units 11/26/2023    1:48 PM 10/22/2023    4:56 AM 10/21/2023    4:50 AM  CBC  WBC 4.0 - 10.5 K/uL 3.0  1.8  2.5   Hemoglobin 12.0 - 15.0 g/dL 16.1  09.6  04.5   Hematocrit 36.0 - 46.0 % 33.8  32.5  30.9   Platelets 150 - 400 K/uL 162  102  105         Latest Ref Rng & Units 11/26/2023    1:48 PM 10/22/2023    4:56 AM 10/21/2023    4:50 AM  CMP  Glucose 70 - 99 mg/dL 93  92  89   BUN 6 - 20 mg/dL 23  8  13    Creatinine 0.44 - 1.00 mg/dL 8.41  3.24  4.01   Sodium 135 - 145 mmol/L 135  134  135   Potassium 3.5 - 5.1 mmol/L 5.0  3.6  3.8   Chloride 98 - 111 mmol/L 104  102  105   CO2 22 - 32 mmol/L 26  25  23    Calcium 8.9 - 10.3 mg/dL 9.3  8.7  8.7   Total Protein 6.5 - 8.1 g/dL 7.1  6.1    Total Bilirubin 0.0 - 1.2 mg/dL 0.4  0.7    Alkaline Phos 38 - 126 U/L 65  83    AST 15 - 41 U/L 19  63    ALT 0 - 44 U/L 11  45        RADIOGRAPHIC STUDIES: I have personally reviewed the radiological images as listed and agreed with the findings in the report. No results found.    Orders Placed This Encounter  Procedures   CT CHEST ABDOMEN PELVIS W CONTRAST    Standing Status:   Future    Expected Date:   01/26/2024    Expiration Date:   11/25/2024    If indicated for the ordered procedure, I authorize the administration of contrast media per Radiology protocol:   Yes    Does the patient have a contrast media/X-ray dye allergy?:   No    Is patient pregnant?:   No    Preferred imaging location?:   [facility]    If indicated for the ordered procedure, I authorize the administration of oral contrast media per Radiology protocol:   Yes   All questions were answered. The patient knows to call the  clinic with any problems, questions or concerns. No barriers to learning was detected. The total time spent in the appointment was 30 minutes, including review of chart and various tests results, discussions about plan of care and coordination of care plan     Abigail [place], MD 11/26/2023

## 2023-12-03 ENCOUNTER — Other Ambulatory Visit: Payer: Self-pay

## 2023-12-13 ENCOUNTER — Encounter (INDEPENDENT_AMBULATORY_CARE_PROVIDER_SITE_OTHER): Payer: Self-pay

## 2023-12-14 ENCOUNTER — Other Ambulatory Visit: Payer: Self-pay

## 2023-12-14 NOTE — Progress Notes (Signed)
 Specialty Pharmacy Refill Coordination Note  Abigail Golden is a 58 y.o. female contacted today regarding refills of specialty medication(s) Osimertinib  Mesylate (TAGRISSO )   Patient requested (Patient-Rptd) Delivery   Delivery date: 12/28/23 (patient req. 7.7.25 delivery)   Verified address: (Patient-Rptd) [address] [place], [place] [address]   Please deliver on Monday, July 7th.   Medication will be filled on 07.03.25.

## 2023-12-23 NOTE — Progress Notes (Signed)
 Telephone nursing appointment for review of most recent MRI-Brain results. I verified patient's identity x2 and began nursing interview.   Patient states issues as follows...   -Fatigue: Yes- Moderate -Hair Loss: Denies -Skin: Denies -Weakness: Denies -Loss of control of extremities: Denies -Headache: Yes- 6/10 Relpax helps. -Seizure/ uncontrolled movement: Denies -Vision: Mildly blurred -Speech: Denies -Confusion: Denies -Dexamethasone / steroids: Denies  Patient denies any other related issues at this time.   Meaningful use complete.   Patient aware of their telephone appointment w/ Ashlyn Bruning PA-C. I left my extension [phone] in case patient needs anything. Patient verbalized understanding. This concludes the nursing interview.   Patient preferred phone # [phone]   Rosaline Minerva, LPN

## 2023-12-29 ENCOUNTER — Inpatient Hospital Stay (HOSPITAL_COMMUNITY): Admission: RE | Admit: 2023-12-29 | Source: Ambulatory Visit

## 2023-12-30 ENCOUNTER — Ambulatory Visit (HOSPITAL_COMMUNITY)
Admission: RE | Admit: 2023-12-30 | Discharge: 2023-12-30 | Disposition: A | Discharge status: Home / Self Care | Source: Ambulatory Visit | Attending: Radiation Oncology | Admitting: Radiation Oncology

## 2023-12-30 DIAGNOSIS — C7931 Secondary malignant neoplasm of brain: Secondary | ICD-10-CM | POA: Diagnosis present

## 2023-12-30 MED ORDER — GADOBUTROL 1 MMOL/ML IV SOLN
10.0000 mL | Freq: Once | INTRAVENOUS | Status: AC | PRN
  Administered 2023-12-30: 10 mL via INTRAVENOUS

## 2023-12-31 ENCOUNTER — Ambulatory Visit: Payer: Self-pay | Admitting: Radiation Oncology

## 2024-01-04 ENCOUNTER — Encounter

## 2024-01-05 ENCOUNTER — Other Ambulatory Visit: Payer: Self-pay

## 2024-01-05 MED ORDER — NYSTATIN 100000 UNIT/GM EX POWD
CUTANEOUS | 1 refills | Status: DC
Start: 2024-01-05 — End: 2024-04-05

## 2024-01-06 ENCOUNTER — Encounter: Payer: Self-pay | Admitting: Urology

## 2024-01-06 ENCOUNTER — Ambulatory Visit
Admission: RE | Admit: 2024-01-06 | Discharge: 2024-01-06 | Disposition: A | Discharge status: Home / Self Care | Source: Ambulatory Visit | Attending: Urology | Admitting: Urology

## 2024-01-06 VITALS — Ht 61.0 in | Wt 257.0 lb

## 2024-01-06 DIAGNOSIS — C7931 Secondary malignant neoplasm of brain: Secondary | ICD-10-CM

## 2024-01-06 MED ORDER — TRAMADOL HCL 50 MG PO TABS: 50.0000 mg | ORAL_TABLET | Freq: Four times a day (QID) | ORAL | 1 refills | Status: DC | PRN

## 2024-01-06 NOTE — Progress Notes (Signed)
 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 Malignant neoplasm metastatic to brain [facility])  C79.31        Interval Since Last Radiation:  9 months   First Treatment Date: 2023-03-30 - Last Treatment Date: 2023-03-30   Plan Name: Brain_SRS Site: Brain Technique: SBRT/SRT-IMRT Mode: Photon Dose Per Fraction: 20 Gy Prescribed Dose (Delivered / Prescribed): 20 Gy / 20 Gy Prescribed Fxs (Delivered / Prescribed): 1 / 1  12/10/21: Single fraction SRS brain PTV9 left frontal lobe    09/19/21 - 11/04/21: The primary tumor in the LUL lung and involved mediastinal adenopathy were treated to 66 Gy in 33 fractions of 2 Gy.   08/27/21: Single fraction SRS Brain PTV7-8 in left frontal lobe    05/08/21 - 05/13/21: Fractionated SRS Brain PTV1 - PTV6      Narrative:  I spoke with the patient to conduct her routine scheduled 3 month follow up visit to review results of her recent MRI brain scan via telephone to spare the patient unnecessary potential exposure in the healthcare setting. The patient was notified in advance and gave permission to proceed with this visit format.   She has recovered well from the effects of her SRS treatments. Her most recent post-treatment MRI brain scan from 12/30/23 demonstrated a stable appearance of the previously treated brain lesions and no new lesions noted.  We reviewed these results today by telephone.   On review of systems, the patient states that she is doing well overall. She reports having some occasional headaches that respond to her migraine medications prn.  She specifically denies any nausea/vomiting, visual or auditory changes, issues with balance or coordination, difficulties with walking, or numbness/tingling in her extremities. 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 Otherwise, she is without complaints and pleased with her progress to date. She continues to tolerate the Tagrisso  fairly well and has remained in close follow up with Dr. Lanny for management of her systemic disease. She is scheduled for restaging CT C/A/P 01/26/24.   ALLERGIES:  is allergic to celebrex [celecoxib] and minocycline.  Meds: Current Outpatient Medications  Medication Sig Dispense Refill   traMADol  (ULTRAM ) 50 MG tablet Take 1 tablet (50 mg total) by mouth every 6 (six) hours as needed. 60 tablet 1   acetaminophen  (TYLENOL ) 500 MG tablet Take 500 mg by mouth every 6 (six) hours as needed for moderate pain or headache.     albuterol  (VENTOLIN  HFA) 108 (90 Base) MCG/ACT inhaler Inhale 2 puffs into the lungs every 4 (four) hours as needed.     ALPRAZolam  (XANAX ) 0.25 MG tablet Take 0.25 mg by mouth every 4 (four) hours as needed for anxiety. for anxiety     aspirin EC 81 MG tablet Take 81 mg by mouth daily. Swallow whole.     citalopram  (CELEXA ) 10 MG tablet Take 10 mg by mouth at bedtime.     Diclofenac Potassium,Migraine, 50 MG PACK  Take 1 each by mouth as needed. As needed for migraines     diclofenac sodium (VOLTAREN) 1 % GEL Apply topically as needed. To affected area     diphenhydrAMINE  (BENADRYL ) 25 MG tablet Take 25 mg by mouth See admin instructions. 25 mg at bedtime 25 mg as needed for itching     Docusate Sodium  (STOOL SOFTENER LAXATIVE PO) Take 1 tablet by mouth as needed.     eletriptan (RELPAX) 20 MG tablet Take 20 mg by mouth as needed for headache (at onset of migraine.). May repeat in 2 hours if  needed.     fluticasone (FLONASE) 50 MCG/ACT nasal spray Place 2 sprays into both nostrils daily as needed for allergies.     HYDROcodone -acetaminophen  (NORCO/VICODIN) 5-325 MG tablet Take 1 tablet by mouth every 6 (six) hours as needed for moderate pain (pain score 4-6).     hydrOXYzine  (ATARAX /VISTARIL ) 25 MG tablet Take 25 mg by mouth See admin instructions. 25 mg at bedtime 25 mg during the day as needed for itching/anxiety     loratadine  (CLARITIN ) 10 MG tablet Take 10 mg by mouth daily.     montelukast  (SINGULAIR ) 10 MG tablet Take 10 mg by mouth at bedtime.     nystatin  powder APPLY  POWDER TOPICALLY TO AFFECTED AREA THREE TIMES DAILY 60 g 1   ondansetron  (ZOFRAN -ODT) 4 MG disintegrating tablet DISSOLVE 1 TABLET IN MOUTH EVERY 8 HOURS AS NEEDED FOR NAUSEA AND VOMITING 18 tablet 1   osimertinib  mesylate (TAGRISSO ) 80 MG tablet Take 1 tablet (80 mg total) by mouth daily. 30 tablet 3   prochlorperazine  (COMPAZINE ) 10 MG tablet TAKE 1 TABLET BY MOUTH EVERY 6 HOURS AS NEEDED FOR NAUSEA FOR VOMITING 30 tablet 1   solifenacin (VESICARE) 10 MG tablet Take 10 mg by mouth every evening.     triamcinolone cream (KENALOG) 0.1 % Apply 1 Application topically 2 (two) times daily.     zolpidem  (AMBIEN ) 10 MG tablet TAKE 1/2 TO 1 (ONE-HALF TO ONE) TABLET BY MOUTH AT BEDTIME AS NEEDED FOR SLEEP 60 tablet 1   No current facility-administered medications for this encounter.    Physical Findings: Today's Vitals   01/06/24 0816  Weight: 257 lb (116.6 kg)  Height: 5' 1 (1.549 m)  PainSc: 6   PainLoc: Head   Body mass index is 48.56 kg/m. Unable to assess due to telephone follow-up visit format.    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 We personally reviewed her MRI brain scan from 12/30/23 which shows a stable appearance of the previously treated lesions and no new lesions so we discussed the plan to continue to monitor closely with serial brain MRIs every 3 months and I will call her to review the results and any recommendations following her each scan. She was educated on signs concerning for disease progression and understands to inform our staff if she experiences any of these in the interim.   She continues to follow up with Dr. Lanny for management of her systemic disease.  She is currently on Tagrisso  therapy and her most recent restaging PET scan from 10/16/23 showed no evidence of disease progression or recurrence.  She is scheduled for a CT C/A/P for disease restaging on 01/26/24 and has a follow-up visit with Dr. Lanny on 02/04/2024 to review those results. We are going to try a trial of Tramadol  prn to see if this helps with the muscle/joint pains she has been having, associated with the Tagroisso.  We appreciate the opportunity to participate in this patient's care. She knows that she is welcome to call with any questions or concerns in the interim.   I personally spent 30 minutes in this encounter including chart review, reviewing radiological studies, telephone conversation with the patient, entering orders and completing documentation.   Sabra MICAEL Rusk, MMS, PA-C [place]  [facility] Radiation Oncology Physician Assistant Direct Dial: [phone]  Fax: [phone]

## 2024-01-07 ENCOUNTER — Telehealth: Payer: Self-pay | Admitting: *Deleted

## 2024-01-07 NOTE — Telephone Encounter (Signed)
 01/06/2024: Late entry  Medication Prior Authorization Status  Processed CoverMyMeds KEY: [id number] Nystatin  powder  Approved Today  Per [employer]  PA Case ID: [id number]   Effective 06/24/2023 through 06/22/2024.

## 2024-01-13 ENCOUNTER — Other Ambulatory Visit: Payer: Self-pay | Admitting: Radiation Therapy

## 2024-01-13 ENCOUNTER — Other Ambulatory Visit: Payer: Self-pay

## 2024-01-13 DIAGNOSIS — C7931 Secondary malignant neoplasm of brain: Secondary | ICD-10-CM

## 2024-01-13 DIAGNOSIS — C3412 Malignant neoplasm of upper lobe, left bronchus or lung: Secondary | ICD-10-CM

## 2024-01-15 ENCOUNTER — Other Ambulatory Visit (HOSPITAL_COMMUNITY): Payer: Self-pay

## 2024-01-19 ENCOUNTER — Other Ambulatory Visit: Payer: Self-pay

## 2024-01-19 NOTE — Progress Notes (Signed)
 Specialty Pharmacy Refill Coordination Note  Abigail Golden is a 58 y.o. female contacted today regarding refills of specialty medication(s) Osimertinib  Mesylate (TAGRISSO )   Patient requested [name] at [facility] at [place] date: 01/26/24   Medication will be filled on 08.01.25.

## 2024-01-21 ENCOUNTER — Other Ambulatory Visit: Payer: Self-pay

## 2024-01-26 ENCOUNTER — Other Ambulatory Visit (HOSPITAL_COMMUNITY): Payer: Self-pay

## 2024-01-26 ENCOUNTER — Ambulatory Visit (HOSPITAL_COMMUNITY)
Admission: RE | Admit: 2024-01-26 | Discharge: 2024-01-26 | Disposition: A | Discharge status: Home / Self Care | Source: Ambulatory Visit | Attending: Hematology | Admitting: Hematology

## 2024-01-26 ENCOUNTER — Inpatient Hospital Stay: Attending: Hematology

## 2024-01-26 DIAGNOSIS — Z79899 Other long term (current) drug therapy: Secondary | ICD-10-CM | POA: Insufficient documentation

## 2024-01-26 DIAGNOSIS — Z7982 Long term (current) use of aspirin: Secondary | ICD-10-CM | POA: Diagnosis not present

## 2024-01-26 DIAGNOSIS — C7931 Secondary malignant neoplasm of brain: Secondary | ICD-10-CM | POA: Diagnosis present

## 2024-01-26 DIAGNOSIS — D6959 Other secondary thrombocytopenia: Secondary | ICD-10-CM | POA: Diagnosis not present

## 2024-01-26 DIAGNOSIS — C3412 Malignant neoplasm of upper lobe, left bronchus or lung: Secondary | ICD-10-CM | POA: Insufficient documentation

## 2024-01-26 DIAGNOSIS — D649 Anemia, unspecified: Secondary | ICD-10-CM | POA: Diagnosis not present

## 2024-01-26 DIAGNOSIS — Z923 Personal history of irradiation: Secondary | ICD-10-CM | POA: Insufficient documentation

## 2024-01-26 LAB — CBC WITH DIFFERENTIAL (CANCER CENTER ONLY)
Abs Immature Granulocytes: 0.01 K/uL (ref 0.00–0.07)
Basophils Absolute: 0 K/uL (ref 0.0–0.1)
Basophils Relative: 0 %
Eosinophils Absolute: 0 K/uL (ref 0.0–0.5)
Eosinophils Relative: 0 %
HCT: 33.7 % — ABNORMAL LOW (ref 36.0–46.0)
Hemoglobin: 11.5 g/dL — ABNORMAL LOW (ref 12.0–15.0)
Immature Granulocytes: 0 %
Lymphocytes Relative: 34 %
Lymphs Abs: 0.8 K/uL (ref 0.7–4.0)
MCH: 29 pg (ref 26.0–34.0)
MCHC: 34.1 g/dL (ref 30.0–36.0)
MCV: 84.9 fL (ref 80.0–100.0)
Monocytes Absolute: 0.4 K/uL (ref 0.1–1.0)
Monocytes Relative: 16 %
Neutro Abs: 1.2 K/uL — ABNORMAL LOW (ref 1.7–7.7)
Neutrophils Relative %: 50 %
Platelet Count: 139 K/uL — ABNORMAL LOW (ref 150–400)
RBC: 3.97 MIL/uL (ref 3.87–5.11)
RDW: 13.2 % (ref 11.5–15.5)
WBC Count: 2.4 K/uL — ABNORMAL LOW (ref 4.0–10.5)
nRBC: 0 % (ref 0.0–0.2)

## 2024-01-26 LAB — CMP (CANCER CENTER ONLY)
ALT: 20 U/L (ref 0–44)
AST: 23 U/L (ref 15–41)
Albumin: 4.2 g/dL (ref 3.5–5.0)
Alkaline Phosphatase: 68 U/L (ref 38–126)
Anion gap: 6 (ref 5–15)
BUN: 13 mg/dL (ref 6–20)
CO2: 27 mmol/L (ref 22–32)
Calcium: 9.3 mg/dL (ref 8.9–10.3)
Chloride: 104 mmol/L (ref 98–111)
Creatinine: 1.15 mg/dL — ABNORMAL HIGH (ref 0.44–1.00)
GFR, Estimated: 56 mL/min — ABNORMAL LOW (ref >60)
Glucose, Bld: 97 mg/dL (ref 70–99)
Potassium: 4.6 mmol/L (ref 3.5–5.1)
Sodium: 137 mmol/L (ref 135–145)
Total Bilirubin: 0.6 mg/dL (ref 0.0–1.2)
Total Protein: 6.9 g/dL (ref 6.5–8.1)

## 2024-01-26 MED ORDER — IOHEXOL 300 MG/ML  SOLN
100.0000 mL | Freq: Once | INTRAMUSCULAR | Status: AC | PRN
  Administered 2024-01-26: 100 mL via INTRAVENOUS

## 2024-01-28 ENCOUNTER — Other Ambulatory Visit: Payer: Self-pay

## 2024-01-28 ENCOUNTER — Other Ambulatory Visit (HOSPITAL_COMMUNITY): Payer: Self-pay

## 2024-01-28 NOTE — Progress Notes (Signed)
 Specialty Pharmacy Ongoing Clinical Assessment Note  Abigail Golden is a 58 y.o. female who is being followed by the specialty pharmacy service for RxSp Oncology   Patient's specialty medication(s) reviewed today: Osimertinib  Mesylate (TAGRISSO )   Missed doses in the last 4 weeks: 0   Patient/Caregiver did not have any additional questions or concerns.   Therapeutic benefit summary: Patient is achieving benefit   Adverse events/side effects summary: Experienced adverse events/side effects (increased nausea (has zofran ), and increase in bruising. Pt to discuss at appt next week)   Patient's therapy is appropriate to: Continue    Goals Addressed             This Visit's Progress    Slow Disease Progression   On track    Patient is on track. Patient will maintain adherence. PET scan in April showed stable disease. Patient has some concerns and questions that she plans to ask at next appointment, also said her bloodwork was not as good as she hoped. She had a CT done on 8/5 which will be discussed at next visit.         Follow up: 6 months  [facility]

## 2024-02-01 IMAGING — CT NM PET TUM IMG RESTAG (PS) SKULL BASE T - THIGH
1 of 7 series · 2 of 25 positions shown · non-contrast
Comparison: CT chest 08/05/2021. com PET-CT 05/13/2021

CLINICAL DATA: Subsequent treatment strategy for non-small cell
lung cancer.

EXAM:
NUCLEAR MEDICINE PET SKULL BASE TO THIGH
TECHNIQUE: 14.9 mCi F-18 FDG was injected intravenously. Full-ring PET imaging
was performed from the skull base to thigh after the radiotracer. CT
data was obtained and used for attenuation correction and anatomic
localization.
Fasting blood glucose: 126 mg/dl

[Series 4: ct sk_thigh 5.0 bf37 · axial · 5.0mm · 0.98mm/px · z∈[-1028,-804]mm · 2 of 224 slices shown]
[im 56/224  brain]
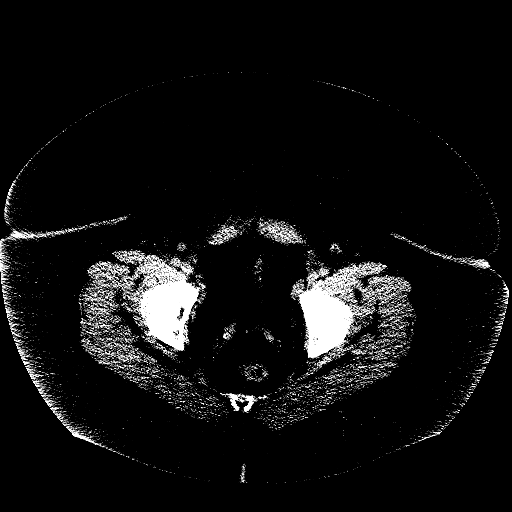
[im 112/224  brain]
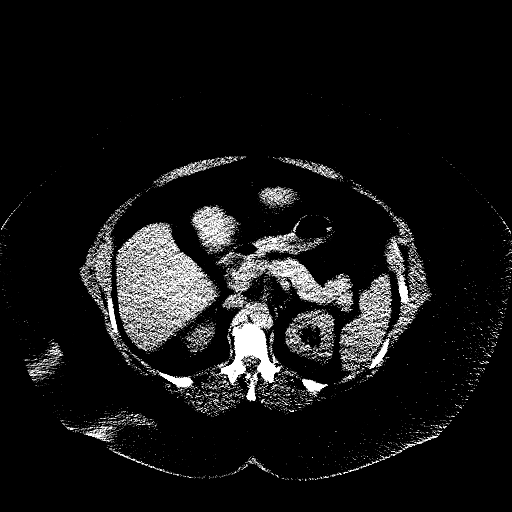

[2 of 25 positions shown; findings below may reference images not displayed]

FINDINGS: Mediastinal blood pool activity: SUV max

Liver activity: SUV max NA

NECK: Hypermetabolic LEFT supraclavicular node measures 13 mm (image
48) with SUV max equal 7.9.

Incidental CT findings: none

CHEST: Hypermetabolic LEFT suprahilar mass measures 4.5 cm with SUV
max equal 17.2. On comparison PET-CT mass measures 4.3 cm with SUV
max equal 17.7.

Hypermetabolic prevascular lymph nodes present. Hypermetabolic LEFT
lower paratracheal lymph node with SUV max equal 9.7.

Mildly hypermetabolic axillary lymph nodes have normal morphology.
For example RIGHT axillary node on image 72 with SUV max equal 3.6.
Similar LEFT axillary node with SUV max equal 4.2 on image 74.

Incidental CT findings: none

ABDOMEN/PELVIS: No abnormal hypermetabolic activity within the
liver, pancreas, adrenal glands, or spleen. No hypermetabolic lymph
nodes in the abdomen or pelvis.

Incidental CT findings: none

SKELETON: No focal hypermetabolic activity to suggest skeletal
metastasis.

Incidental CT findings: none
IMPRESSION: 1. Hypermetabolic LEFT suprahilar mass consistent with primary
bronchogenic carcinoma. Mass is similar in size and metabolic
activity to prior.
2. Hypermetabolic ipsilateral nodal metastasis to the prevascular
nodal space AP window and LEFT lower paratracheal nodal station. New
from comparison PET-CT scan.
3. Ipsilateral LEFT supraclavicular hypermetabolic nodal metastasis.
New from comparison PET-CT scan.
4. Bilateral moderate metabolic activity associated with normal
morphology axillary lymph nodes. Finding new from comparison exam
however indeterminate finding. Favor reactive.
5. No evidence of distant metastatic disease.

## 2024-02-02 ENCOUNTER — Ambulatory Visit: Admitting: Hematology

## 2024-02-04 ENCOUNTER — Inpatient Hospital Stay: Admitting: Hematology

## 2024-02-04 VITALS — BP 112/60 | HR 76 | Temp 97.8°F | Resp 19 | Ht 61.0 in | Wt 254.6 lb

## 2024-02-04 DIAGNOSIS — C3412 Malignant neoplasm of upper lobe, left bronchus or lung: Secondary | ICD-10-CM

## 2024-02-04 NOTE — Progress Notes (Signed)
 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 She tolerates moderately well with mucositis and skin toxicities.   -she had thoracic radiation (The primary tumor and involved mediastinal adenopathy were treated to 66 Gy in 33 fractions of 2 Gy), completed on 11/04/2021 - her recent restaging CT scan from May 22, 2022 showed continued response in the primary site and thoracic adenopathy, or new lesions. -She is clinically doing well, lives independently, tolerating Tagrisso  well, will continue. - restaging CT from 03/19/2023 showed post radiation change in lung and no evidence of recurrence. Will continue Tagrisso   -she we will continue follow-up with his radiation oncologist Dr. Patrcia for her brain metastasis, as single fraction SRS was recommended based on her recent brain MRI from March 18, 2023 -restaging CT CAP 06/22/2023 showed stable diease, no progression  -PET 10/16/2023 showed SD  Assessment & Plan Metastatic lung cancer Well-managed metastatic lung cancer with no new lesions. Recent CT scan shows radiation changes in the left lung but no evidence of metastatic disease in the chest, abdomen, or pelvis. Liver and lymph nodes appear normal. Brain scan scheduled for October. 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 Hemoglobin is 11.5, which is an improvement. White blood cell count is slightly below normal, increasing susceptibility to infections. Bruising noted, possibly exacerbated by aspirin use. - Discontinue aspirin if no history of heart attack or stroke  Recurrent skin boils (furunculosis) Recurrent skin boils likely due to moisture and skin folds. White blood cell count is slightly below normal, increasing susceptibility to skin infections. Current boils located at the waistline. - Advise keeping skin dry and clean - Use topical antibiotics on affected areas - Apply warm compresses to boils with white heads  Chronic pain of hips and legs Chronic pain in hips and legs managed with tramadol . Pain exacerbated by activity and relieved by rest. Tramadol  taken as needed, sometimes half a dose, occasionally a full dose for severe pain. - Continue tramadol  as needed for pain management  Plan - Abigail Golden is clinically doing well, overall stable, no new concerns. - Restaging CT scan reviewed, stable disease, no new lesions - Lab reviewed. - Continue Tagrisso  - Lab and follow-up in 2 months, next restaging scan in 4 months.     SUMMARY OF ONCOLOGIC HISTORY: Oncology History Overview Note   Cancer Staging  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) Staging form: Lung, AJCC 8th Edition - Clinical stage from 04/24/2021: Stage IVA (cT2, cN2, pM1b) - Signed by [name], MD on 05/14/2021    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)  04/22/2021 Imaging   CT HEAD WO CONTRAST   IMPRESSION: Two separate areas of vasogenic edema within the left hemisphere, 1 within the inferior frontal region in the other  at the left parietal vertex. Metastatic disease would be the most likely cause of this appearance. Other possibilities include venous  infarctions and cerebritis. MRI with contrast recommended.    04/23/2021 Imaging   MR Brain W and Wo Contrast  IMPRESSION: 1. Metastatic disease to the brain. Two enhancing brain masses with moderate associated vasogenic edema: solid 16 mm left parietal lobe mass, and a larger 29 mm rim enhancing cystic or necrotic mass in the left inferior frontal gyrus. Two other small 2-3 mm metastases in the right inferior frontal gyrus, right posterior temporal lobe. And questionable two additional punctate metastases in both superior frontal gyri abutting the falx (series 18, image 46). 2. No significant intracranial mass effect. No other intracranial abnormality.   04/23/2021 Imaging   CT Chest W Contrast  IMPRESSION: 4.5 cm spiculated left upper lobe mass most compatible with primary lung cancer. This abuts the medial pleural surface. A few small adjacent pre-vascular mediastinal lymph nodes, none pathologically enlarged. Recommend cardiothoracic surgical and oncologic consultation.   No acute findings or evidence of metastatic disease in the abdomen or pelvis.   04/24/2021 Cancer Staging   Staging form: Lung, AJCC 8th Edition - Clinical stage from 04/24/2021: Stage IVA (cT2, cN2, pM1b) - Signed by [name], MD on 05/14/2021   04/24/2021 Initial Biopsy   FINAL MICROSCOPIC DIAGNOSIS:   A. LUNG, LUL, FINE NEEDLE ASPIRATION:  - Malignant cells present, consistent with adenocarcinoma   B. LUNG, LUL, BRUSHING:  - Malignant cells consistent with adenocarcinoma, see comment    COMMENT:  B.  Immunohistochemical stains show that the tumor cells are positive for TTF-1 while they are negative for p63 and CK5/6, consistent with above interpretation. The number of tumor cells appears to be insufficient for molecular studies.   05/03/2021 Initial Diagnosis   Lung cancer (HCC)   05/08/2021 - 05/13/2021 Radiation Therapy   SRS treatment to the metastatic brain lesions under the care of Dr. Patrcia     08/05/2021 Imaging   EXAM: CT CHEST, ABDOMEN, AND PELVIS WITH CONTRAST  IMPRESSION: 1. No substantial change in size of the left upper lobe pulmonary lesion. 2. Borderline to mildly enlarged adjacent prevascular/AP window lymph nodes have increased in size in the interval. This finding raises concern for disease progression. 3. Interval development of upper normal bilateral common iliac and left external iliac lymph nodes. Close attention on follow-up recommended. 4. No other evidence for metastatic disease in the abdomen or pelvis.   09/22/2022 Imaging    IMPRESSION: 1. Area of chronic postradiation mass-like fibrosis in the posteromedial aspect of the left upper lung, without definitive findings of locally recurrent disease or definite metastatic disease in the chest, abdomen or pelvis. 2. Mild atherosclerosis. 3. Additional incidental findings, as above.     03/19/2023 Imaging   CT chest,abdomen, and pelvis with contrast IMPRESSION: 1. Unchanged appearance of posterior paramedian left upper lobe with dense post treatment fibrosis and volume loss. 2. No evidence of recurrent or metastatic disease in the chest, abdomen, or pelvis. 3. Mildly coarse contour of the liver, suggestive of cirrhosis. Correlate with biochemical findings. 4. Status post cholecystectomy and hysterectomy.  Aortic Atherosclerosis (ICD10-       Discussed the use of AI scribe software for clinical note transcription with the patient, who gave verbal consent to proceed.  History of Present Illness Abigail Golden is a 58 year old female with metastatic lung cancer who presents for follow-up.  A recent CT scan shows radiation changes in  the left lung. A brain scan is scheduled for October. She experiences bruising, which she attributes to minor bumps, and is concerned about the role of her daily 81 mg aspirin. Blood tests show a low blood count, a side effect of Tegresol, with a hemoglobin level  of 11.5.  She has boils at her waistline, managed with topical antibiotics, gauze, and powder. Her white blood cell count is slightly below normal, increasing infection risk.  She experiences leg and hip pain, managed with tramadol , usually half a dose for sleep, and occasionally a full dose when exhausted. She experienced a migraine last week, initially unresponsive to Relpax but resolved with a sodium-based powder medication. Occasional nausea is managed by sipping ginger ale.     All other systems were reviewed with the patient and are negative.  MEDICAL HISTORY:  Past Medical History:  Diagnosis Date   Allergy    Anxiety    Chronic bronchitis (HCC)    Depression    Headache    Hypoglycemia    Lower back pain    since fall ~ 2012 (05/25/2015)   Migraine    05/25/2015 probably once/month   Migraine headache    Pain in left hip    since fall ~ 2012 (05/25/2015)   Pneumonia several times    SURGICAL HISTORY: Past Surgical History:  Procedure Laterality Date   ABDOMINAL HYSTERECTOMY  11/2002   found benign tumors   BRONCHIAL BIOPSY  04/24/2021   Procedure: BRONCHIAL BIOPSIES;  Surgeon: Brenna Adine CROME, DO;  Location: [facility];  Service: Pulmonary;;   BRONCHIAL BRUSHINGS  04/24/2021   Procedure: BRONCHIAL BRUSHINGS;  Surgeon: Brenna Adine CROME, DO;  Location: [facility];  Service: Pulmonary;;   BRONCHIAL NEEDLE ASPIRATION BIOPSY  04/24/2021   Procedure: BRONCHIAL NEEDLE ASPIRATION BIOPSIES;  Surgeon: Brenna Adine CROME, DO;  Location: [facility];  Service: Pulmonary;;   CHEST TUBE INSERTION  04/24/2021   Procedure: CHEST TUBE INSERTION;  Surgeon: Brenna Adine CROME, DO;  Location: [facility];  Service: Pulmonary;;   CHOLECYSTECTOMY N/A 05/25/2015   Procedure: LAPAROSCOPIC CHOLECYSTECTOMY;  Surgeon: Lynda Leos, MD;  Location: [facility];  Service: General;  Laterality: N/A;   LAPAROSCOPIC CHOLECYSTECTOMY  05/25/2015   MENISCUS REPAIR Right 03/2017   VIDEO BRONCHOSCOPY  WITH ENDOBRONCHIAL NAVIGATION N/A 04/24/2021   Procedure: VIDEO BRONCHOSCOPY WITH ENDOBRONCHIAL NAVIGATION;  Surgeon: Brenna Adine CROME, DO;  Location: [facility];  Service: Pulmonary;  Laterality: N/A;   VIDEO BRONCHOSCOPY WITH RADIAL ENDOBRONCHIAL ULTRASOUND  04/24/2021   Procedure: VIDEO BRONCHOSCOPY WITH RADIAL ENDOBRONCHIAL ULTRASOUND;  Surgeon: Brenna Adine CROME, DO;  Location: [facility];  Service: Pulmonary;;    I have reviewed the social history and family history with the patient and they are unchanged from previous note.  ALLERGIES:  is allergic to celebrex [celecoxib] and minocycline.  MEDICATIONS:  Current Outpatient Medications  Medication Sig Dispense Refill   acetaminophen  (TYLENOL ) 500 MG tablet Take 500 mg by mouth every 6 (six) hours as needed for moderate pain or headache.     albuterol  (VENTOLIN  HFA) 108 (90 Base) MCG/ACT inhaler Inhale 2 puffs into the lungs every 4 (four) hours as needed.     ALPRAZolam  (XANAX ) 0.25 MG tablet Take 0.25 mg by mouth every 4 (four) hours as needed for anxiety. for anxiety     aspirin EC 81 MG tablet Take 81 mg by mouth daily. Swallow whole.     citalopram  (CELEXA ) 10 MG tablet Take 10 mg by mouth at bedtime.  Diclofenac Potassium,Migraine, 50 MG PACK Take 1 each by mouth as needed. As needed for migraines     diclofenac sodium (VOLTAREN) 1 % GEL Apply topically as needed. To affected area     diphenhydrAMINE  (BENADRYL ) 25 MG tablet Take 25 mg by mouth See admin instructions. 25 mg at bedtime 25 mg as needed for itching     Docusate Sodium  (STOOL SOFTENER LAXATIVE PO) Take 1 tablet by mouth as needed.     eletriptan (RELPAX) 20 MG tablet Take 20 mg by mouth as needed for headache (at onset of migraine.). May repeat in 2 hours if needed.     fluticasone (FLONASE) 50 MCG/ACT nasal spray Place 2 sprays into both nostrils daily as needed for allergies.     HYDROcodone -acetaminophen  (NORCO/VICODIN) 5-325 MG tablet Take 1 tablet by mouth  every 6 (six) hours as needed for moderate pain (pain score 4-6).     hydrOXYzine  (ATARAX /VISTARIL ) 25 MG tablet Take 25 mg by mouth See admin instructions. 25 mg at bedtime 25 mg during the day as needed for itching/anxiety     loratadine  (CLARITIN ) 10 MG tablet Take 10 mg by mouth daily.     montelukast  (SINGULAIR ) 10 MG tablet Take 10 mg by mouth at bedtime.     nystatin  powder APPLY  POWDER TOPICALLY TO AFFECTED AREA THREE TIMES DAILY 60 g 1   ondansetron  (ZOFRAN -ODT) 4 MG disintegrating tablet DISSOLVE 1 TABLET IN MOUTH EVERY 8 HOURS AS NEEDED FOR NAUSEA AND VOMITING 18 tablet 1   osimertinib  mesylate (TAGRISSO ) 80 MG tablet Take 1 tablet (80 mg total) by mouth daily. 30 tablet 3   prochlorperazine  (COMPAZINE ) 10 MG tablet TAKE 1 TABLET BY MOUTH EVERY 6 HOURS AS NEEDED FOR NAUSEA FOR VOMITING 30 tablet 1   solifenacin (VESICARE) 10 MG tablet Take 10 mg by mouth every evening.     traMADol  (ULTRAM ) 50 MG tablet Take 1 tablet (50 mg total) by mouth every 6 (six) hours as needed. 60 tablet 1   triamcinolone cream (KENALOG) 0.1 % Apply 1 Application topically 2 (two) times daily.     zolpidem  (AMBIEN ) 10 MG tablet TAKE 1/2 TO 1 (ONE-HALF TO ONE) TABLET BY MOUTH AT BEDTIME AS NEEDED FOR SLEEP 60 tablet 1   No current facility-administered medications for this visit.    PHYSICAL EXAMINATION: ECOG PERFORMANCE STATUS: 1 - Symptomatic but completely ambulatory  Vitals:   02/04/24 1354  BP: 112/60  Pulse: 76  Resp: 19  Temp: 97.8 F (36.6 C)  SpO2: 96%   Wt Readings from Last 3 Encounters:  02/04/24 254 lb 9.6 oz (115.5 kg)  01/06/24 257 lb (116.6 kg)  11/26/23 260 lb 1.6 oz (118 kg)     GENERAL:alert, no distress and comfortable SKIN: skin color, texture, turgor are normal, no rashes or significant lesions EYES: normal, Conjunctiva are pink and non-injected, sclera clear Musculoskeletal:no cyanosis of digits and no clubbing  NEURO: alert & oriented x 3 with fluent speech, no focal  motor/sensory deficits  Physical Exam   LABORATORY DATA:  I have reviewed the data as listed    Latest Ref Rng & Units 01/26/2024   12:23 PM 11/26/2023    1:48 PM 10/22/2023    4:56 AM  CBC  WBC 4.0 - 10.5 K/uL 2.4  3.0  1.8   Hemoglobin 12.0 - 15.0 g/dL 88.4  88.5  89.2   Hematocrit 36.0 - 46.0 % 33.7  33.8  32.5   Platelets 150 - 400 K/uL 139  162  102         Latest Ref Rng & Units 01/26/2024   12:23 PM 11/26/2023    1:48 PM 10/22/2023    4:56 AM  CMP  Glucose 70 - 99 mg/dL 97  93  92   BUN 6 - 20 mg/dL 13  23  8    Creatinine 0.44 - 1.00 mg/dL 8.84  8.76  8.84   Sodium 135 - 145 mmol/L 137  135  134   Potassium 3.5 - 5.1 mmol/L 4.6  5.0  3.6   Chloride 98 - 111 mmol/L 104  104  102   CO2 22 - 32 mmol/L 27  26  25    Calcium 8.9 - 10.3 mg/dL 9.3  9.3  8.7   Total Protein 6.5 - 8.1 g/dL 6.9  7.1  6.1   Total Bilirubin 0.0 - 1.2 mg/dL 0.6  0.4  0.7   Alkaline Phos 38 - 126 U/L 68  65  83   AST 15 - 41 U/L 23  19  63   ALT 0 - 44 U/L 20  11  45       RADIOGRAPHIC STUDIES: I have personally reviewed the radiological images as listed and agreed with the findings in the report. No results found.    No orders of the defined types were placed in this encounter.  All questions were answered. The patient knows to call the clinic with any problems, questions or concerns. No barriers to learning was detected. The total time spent in the appointment was 25 minutes, including review of chart and various tests results, discussions about plan of care and coordination of care plan     Onita Mattock, MD 02/04/2024

## 2024-02-04 NOTE — Assessment & Plan Note (Signed)
 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 She tolerates moderately well with mucositis and skin toxicities.   -she had thoracic radiation (The primary tumor and involved mediastinal adenopathy were treated to 66 Gy in 33 fractions of 2 Gy), completed on 11/04/2021 - her recent restaging CT scan from May 22, 2022 showed continued response in the primary site and thoracic adenopathy, or new lesions. -She is clinically doing well, lives independently, tolerating Tagrisso  well, will continue. - restaging CT from 03/19/2023 showed post radiation change in lung and no evidence of recurrence. Will continue Tagrisso   -she we will continue follow-up with his radiation oncologist Dr. Lorri Rota for her brain metastasis, as single fraction SRS was recommended based on her recent brain MRI from March 18, 2023 -restaging CT CAP 06/22/2023 showed stable diease, no progression  -PET 10/16/2023 showed SD

## 2024-02-15 ENCOUNTER — Telehealth: Payer: Self-pay

## 2024-02-15 ENCOUNTER — Other Ambulatory Visit: Payer: Self-pay

## 2024-02-15 NOTE — Telephone Encounter (Signed)
 Pt called stating when last seen in clinic w/Dr. Lanny the pt has boils on her abdomen.  Pt stated it was 1 or 2 boils at the time but over the next several days after seeing Dr. Lanny the pt developed more boils, increased abdominal pain, and the boils started to develop white tops.  Pt stated she f/u with her PCP who assessed and did cultures on the pt.  Pt stated she was notified by PCP that she has MRSA.  Pt's PCP prescribed Doxycycline PO BID and Abx ointment to apply to the boils.  Pt described the drainage as a yellow and red color.  Denied a foul smell and pt denied having a temperature.  Pt stated her PCP wanted the pt to consult w/Dr. Lanny regarding the pt's Tagrisso .  Stated that this nurse will make Dr. Lanny aware.

## 2024-02-16 ENCOUNTER — Encounter (HOSPITAL_COMMUNITY): Payer: Self-pay | Admitting: Emergency Medicine

## 2024-02-16 ENCOUNTER — Emergency Department (HOSPITAL_COMMUNITY)

## 2024-02-16 ENCOUNTER — Inpatient Hospital Stay (HOSPITAL_COMMUNITY)
Admission: EM | Admit: 2024-02-16 | Discharge: 2024-02-19 | DRG: 602 | Disposition: A | Discharge status: Home / Self Care | Attending: Internal Medicine | Admitting: Internal Medicine

## 2024-02-16 ENCOUNTER — Other Ambulatory Visit: Payer: Self-pay

## 2024-02-16 DIAGNOSIS — Z79899 Other long term (current) drug therapy: Secondary | ICD-10-CM

## 2024-02-16 DIAGNOSIS — Z923 Personal history of irradiation: Secondary | ICD-10-CM

## 2024-02-16 DIAGNOSIS — Z881 Allergy status to other antibiotic agents status: Secondary | ICD-10-CM

## 2024-02-16 DIAGNOSIS — T8089XA Other complications following infusion, transfusion and therapeutic injection, initial encounter: Secondary | ICD-10-CM | POA: Diagnosis not present

## 2024-02-16 DIAGNOSIS — C3490 Malignant neoplasm of unspecified part of unspecified bronchus or lung: Secondary | ICD-10-CM | POA: Diagnosis not present

## 2024-02-16 DIAGNOSIS — Z7982 Long term (current) use of aspirin: Secondary | ICD-10-CM

## 2024-02-16 DIAGNOSIS — E66813 Obesity, class 3: Secondary | ICD-10-CM | POA: Diagnosis present

## 2024-02-16 DIAGNOSIS — F32A Depression, unspecified: Secondary | ICD-10-CM | POA: Diagnosis present

## 2024-02-16 DIAGNOSIS — L03311 Cellulitis of abdominal wall: Secondary | ICD-10-CM | POA: Diagnosis not present

## 2024-02-16 DIAGNOSIS — L0292 Furuncle, unspecified: Secondary | ICD-10-CM | POA: Diagnosis present

## 2024-02-16 DIAGNOSIS — G4700 Insomnia, unspecified: Secondary | ICD-10-CM | POA: Diagnosis present

## 2024-02-16 DIAGNOSIS — Z90710 Acquired absence of both cervix and uterus: Secondary | ICD-10-CM

## 2024-02-16 DIAGNOSIS — Z8701 Personal history of pneumonia (recurrent): Secondary | ICD-10-CM

## 2024-02-16 DIAGNOSIS — U071 COVID-19: Secondary | ICD-10-CM | POA: Diagnosis not present

## 2024-02-16 DIAGNOSIS — Z9049 Acquired absence of other specified parts of digestive tract: Secondary | ICD-10-CM

## 2024-02-16 DIAGNOSIS — J4489 Other specified chronic obstructive pulmonary disease: Secondary | ICD-10-CM | POA: Diagnosis present

## 2024-02-16 DIAGNOSIS — Z6841 Body Mass Index (BMI) 40.0 and over, adult: Secondary | ICD-10-CM

## 2024-02-16 DIAGNOSIS — R509 Fever, unspecified: Secondary | ICD-10-CM | POA: Diagnosis not present

## 2024-02-16 DIAGNOSIS — Z8719 Personal history of other diseases of the digestive system: Secondary | ICD-10-CM

## 2024-02-16 DIAGNOSIS — C7931 Secondary malignant neoplasm of brain: Secondary | ICD-10-CM | POA: Diagnosis present

## 2024-02-16 DIAGNOSIS — Y848 Other medical procedures as the cause of abnormal reaction of the patient, or of later complication, without mention of misadventure at the time of the procedure: Secondary | ICD-10-CM | POA: Diagnosis not present

## 2024-02-16 DIAGNOSIS — Z886 Allergy status to analgesic agent status: Secondary | ICD-10-CM

## 2024-02-16 DIAGNOSIS — F419 Anxiety disorder, unspecified: Secondary | ICD-10-CM | POA: Diagnosis present

## 2024-02-16 DIAGNOSIS — N1832 Chronic kidney disease, stage 3b: Secondary | ICD-10-CM | POA: Diagnosis present

## 2024-02-16 DIAGNOSIS — G43909 Migraine, unspecified, not intractable, without status migrainosus: Secondary | ICD-10-CM | POA: Diagnosis present

## 2024-02-16 DIAGNOSIS — Z8249 Family history of ischemic heart disease and other diseases of the circulatory system: Secondary | ICD-10-CM

## 2024-02-16 DIAGNOSIS — Y92230 Patient room in hospital as the place of occurrence of the external cause: Secondary | ICD-10-CM | POA: Diagnosis not present

## 2024-02-16 LAB — COMPREHENSIVE METABOLIC PANEL WITH GFR
ALT: 63 U/L — ABNORMAL HIGH (ref 0–44)
AST: 31 U/L (ref 15–41)
Albumin: 4.1 g/dL (ref 3.5–5.0)
Alkaline Phosphatase: 151 U/L — ABNORMAL HIGH (ref 38–126)
Anion gap: 15 (ref 5–15)
BUN: 18 mg/dL (ref 6–20)
CO2: 21 mmol/L — ABNORMAL LOW (ref 22–32)
Calcium: 9.9 mg/dL (ref 8.9–10.3)
Chloride: 101 mmol/L (ref 98–111)
Creatinine, Ser: 1.22 mg/dL — ABNORMAL HIGH (ref 0.44–1.00)
GFR, Estimated: 52 mL/min — ABNORMAL LOW (ref >60)
Glucose, Bld: 103 mg/dL — ABNORMAL HIGH (ref 70–99)
Potassium: 4.4 mmol/L (ref 3.5–5.1)
Sodium: 136 mmol/L (ref 135–145)
Total Bilirubin: 0.4 mg/dL (ref 0.0–1.2)
Total Protein: 7.5 g/dL (ref 6.5–8.1)

## 2024-02-16 LAB — CBC WITH DIFFERENTIAL/PLATELET
Abs Immature Granulocytes: 0.04 K/uL (ref 0.00–0.07)
Basophils Absolute: 0 K/uL (ref 0.0–0.1)
Basophils Relative: 0 %
Eosinophils Absolute: 0 K/uL (ref 0.0–0.5)
Eosinophils Relative: 0 %
HCT: 37 % (ref 36.0–46.0)
Hemoglobin: 11.5 g/dL — ABNORMAL LOW (ref 12.0–15.0)
Immature Granulocytes: 1 %
Lymphocytes Relative: 17 %
Lymphs Abs: 0.7 K/uL (ref 0.7–4.0)
MCH: 27.3 pg (ref 26.0–34.0)
MCHC: 31.1 g/dL (ref 30.0–36.0)
MCV: 87.7 fL (ref 80.0–100.0)
Monocytes Absolute: 0.5 K/uL (ref 0.1–1.0)
Monocytes Relative: 11 %
Neutro Abs: 3 K/uL (ref 1.7–7.7)
Neutrophils Relative %: 71 %
Platelets: 224 K/uL (ref 150–400)
RBC: 4.22 MIL/uL (ref 3.87–5.11)
RDW: 13.4 % (ref 11.5–15.5)
WBC: 4.3 K/uL (ref 4.0–10.5)
nRBC: 0 % (ref 0.0–0.2)

## 2024-02-16 LAB — SARS CORONAVIRUS 2 BY RT PCR: SARS Coronavirus 2 by RT PCR: POSITIVE — AB

## 2024-02-16 LAB — I-STAT CG4 LACTIC ACID, ED
Lactic Acid, Venous: 0.6 mmol/L (ref 0.5–1.9)
Lactic Acid, Venous: 0.4 mmol/L — ABNORMAL LOW (ref 0.5–1.9)

## 2024-02-16 MED ORDER — IOHEXOL 300 MG/ML  SOLN
100.0000 mL | Freq: Once | INTRAMUSCULAR | Status: AC | PRN
  Administered 2024-02-16: 100 mL via INTRAVENOUS

## 2024-02-16 MED ORDER — DM-GUAIFENESIN ER 30-600 MG PO TB12
1.0000 | ORAL_TABLET | Freq: Two times a day (BID) | ORAL | Status: DC
  Administered 2024-02-17 – 2024-02-19 (×6): 1 via ORAL
  Filled 2024-02-16 (×6): qty 1

## 2024-02-16 MED ORDER — ACETAMINOPHEN 325 MG PO TABS
650.0000 mg | ORAL_TABLET | Freq: Four times a day (QID) | ORAL | Status: DC | PRN
  Administered 2024-02-17: 650 mg via ORAL
  Filled 2024-02-16: qty 2

## 2024-02-16 MED ORDER — ONDANSETRON HCL 4 MG/2ML IJ SOLN
4.0000 mg | Freq: Four times a day (QID) | INTRAMUSCULAR | Status: DC | PRN
  Administered 2024-02-17 – 2024-02-19 (×2): 4 mg via INTRAVENOUS
  Filled 2024-02-16 (×2): qty 2

## 2024-02-16 MED ORDER — SODIUM CHLORIDE 0.9 % IV SOLN: INTRAVENOUS | Status: AC

## 2024-02-16 MED ORDER — ACETAMINOPHEN 650 MG RE SUPP: 650.0000 mg | Freq: Four times a day (QID) | RECTAL | Status: DC | PRN

## 2024-02-16 MED ORDER — SENNOSIDES-DOCUSATE SODIUM 8.6-50 MG PO TABS: 1.0000 | ORAL_TABLET | Freq: Every evening | ORAL | Status: DC | PRN

## 2024-02-16 MED ORDER — VANCOMYCIN HCL IN DEXTROSE 1-5 GM/200ML-% IV SOLN
1000.0000 mg | INTRAVENOUS | Status: DC
  Administered 2024-02-17: 1000 mg via INTRAVENOUS
  Filled 2024-02-16: qty 200

## 2024-02-16 MED ORDER — ONDANSETRON HCL 4 MG/2ML IJ SOLN
4.0000 mg | Freq: Once | INTRAMUSCULAR | Status: AC
  Administered 2024-02-16: 4 mg via INTRAVENOUS
  Filled 2024-02-16: qty 2

## 2024-02-16 MED ORDER — VANCOMYCIN HCL 2000 MG/400ML IV SOLN
2000.0000 mg | Freq: Once | INTRAVENOUS | Status: AC
  Administered 2024-02-16: 2000 mg via INTRAVENOUS
  Filled 2024-02-16: qty 400

## 2024-02-16 MED ORDER — ONDANSETRON HCL 4 MG PO TABS
4.0000 mg | ORAL_TABLET | Freq: Four times a day (QID) | ORAL | Status: DC | PRN
Start: 2024-02-16 — End: 2024-02-19

## 2024-02-16 MED ORDER — ENOXAPARIN SODIUM 40 MG/0.4ML IJ SOSY
40.0000 mg | PREFILLED_SYRINGE | INTRAMUSCULAR | Status: DC
  Administered 2024-02-17 – 2024-02-19 (×3): 40 mg via SUBCUTANEOUS
  Filled 2024-02-16 (×3): qty 0.4

## 2024-02-16 MED ORDER — BISACODYL 5 MG PO TBEC: 5.0000 mg | DELAYED_RELEASE_TABLET | Freq: Every day | ORAL | Status: DC | PRN

## 2024-02-16 NOTE — H&P (Incomplete)
 History and Physical  VANETTA RULE FMW:[id number] DOB: 08-30-1965 DOA: 02/16/2024  PCP: [name], PA-C   Chief Complaint: Fever, cough, body aches and nausea  HPI: Abigail Golden is a 58 y.o. 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  ED Course: Initial vitals show afebrile, normotensive with SpO2 97% on room air. Initial labs significant for creatinine 1.22, alk phos 151, WBC 4.3, Hgb 11.5, lactic acid 0.6-0.4, positive COVID-19 test. CXR shows no active disease.  CT A/P showed right anterior abdominal wall edema but no abscess or intra-abdominal pathology. Pt received IV vancomycin  and IV Zofran . TRH was consulted for admission.   Review of Systems: Please see HPI for pertinent positives and negatives. A complete 10 system review of systems are otherwise negative.  Past Medical History:  Diagnosis Date   Allergy    Anxiety    Chronic bronchitis (HCC)    Depression    Headache    Hypoglycemia    Lower back pain    since fall ~ 2012 (05/25/2015)   Migraine    05/25/2015 probably once/month   Migraine headache    Pain in left hip    since fall ~ 2012 (05/25/2015)   Pneumonia several times   Past Surgical History:  Procedure Laterality Date   ABDOMINAL HYSTERECTOMY  11/2002   found benign tumors   BRONCHIAL BIOPSY  04/24/2021   Procedure: BRONCHIAL BIOPSIES;  Surgeon: Brenna Adine CROME, DO;  Location: [facility];  Service: Pulmonary;;   BRONCHIAL BRUSHINGS  04/24/2021   Procedure: BRONCHIAL BRUSHINGS;  Surgeon: Brenna Adine CROME, DO;  Location: [facility];  Service: Pulmonary;;   BRONCHIAL NEEDLE ASPIRATION BIOPSY  04/24/2021   Procedure: BRONCHIAL NEEDLE ASPIRATION BIOPSIES;  Surgeon: Brenna Adine CROME,  DO;  Location: [facility];  Service: Pulmonary;;   CHEST TUBE INSERTION  04/24/2021   Procedure: CHEST TUBE INSERTION;  Surgeon: Brenna Adine CROME, DO;  Location: [facility];  Service: Pulmonary;;   CHOLECYSTECTOMY N/A 05/25/2015   Procedure: LAPAROSCOPIC CHOLECYSTECTOMY;  Surgeon: Lynda Leos, MD;  Location: [facility];  Service: General;  Laterality: N/A;   LAPAROSCOPIC CHOLECYSTECTOMY  05/25/2015   MENISCUS REPAIR Right 03/2017   VIDEO BRONCHOSCOPY WITH ENDOBRONCHIAL NAVIGATION N/A 04/24/2021   Procedure: VIDEO BRONCHOSCOPY WITH ENDOBRONCHIAL NAVIGATION;  Surgeon: Brenna Adine CROME, DO;  Location: [facility];  Service: Pulmonary;  Laterality: N/A;   VIDEO BRONCHOSCOPY WITH RADIAL ENDOBRONCHIAL ULTRASOUND  04/24/2021   Procedure: VIDEO BRONCHOSCOPY WITH RADIAL ENDOBRONCHIAL ULTRASOUND;  Surgeon: Brenna Adine CROME, DO;  Location: [facility];  Service: Pulmonary;;   Social History:  reports that she has never smoked. She has never used smokeless tobacco. She reports that she does not drink alcohol and does not use drugs.  Allergies  Allergen Reactions   Celebrex [Celecoxib] Itching and Rash    Facial itching   Minocycline Itching and Rash    Facial itching     Family History  Problem Relation Age of Onset   Heart disease Mother    Kassie Pour White syndrome Father    Kassie Parkinson White syndrome Paternal Grandmother    Diabetes Paternal Grandmother    Stomach cancer Paternal Grandmother    Breast cancer Neg Hx      Prior to Admission medications   Medication Sig Start  Date End Date Taking? Authorizing Provider  acetaminophen  (TYLENOL ) 500 MG tablet Take 500 mg by mouth every 6 (six) hours as needed for moderate pain or headache.    [provider]  albuterol  (VENTOLIN  HFA) 108 (90 Base) MCG/ACT inhaler Inhale 2 puffs into the lungs every 4 (four) hours as needed. 06/21/19   [provider]  ALPRAZolam  (XANAX ) 0.25 MG tablet Take 0.25 mg by mouth every 4  (four) hours as needed for anxiety. for anxiety 07/16/18   [provider]  aspirin EC 81 MG tablet Take 81 mg by mouth daily. Swallow whole.    [provider]  citalopram  (CELEXA ) 10 MG tablet Take 10 mg by mouth at bedtime. 08/16/18   [provider]  Diclofenac Potassium,Migraine, 50 MG PACK Take 1 each by mouth as needed. As needed for migraines    [provider]  diclofenac sodium (VOLTAREN) 1 % GEL Apply topically as needed. To affected area    [provider]  diphenhydrAMINE  (BENADRYL ) 25 MG tablet Take 25 mg by mouth See admin instructions. 25 mg at bedtime 25 mg as needed for itching    [provider]  Docusate Sodium  (STOOL SOFTENER LAXATIVE PO) Take 1 tablet by mouth as needed.    [provider]  eletriptan (RELPAX) 20 MG tablet Take 20 mg by mouth as needed for headache (at onset of migraine.). May repeat in 2 hours if needed.    [provider]  fluticasone (FLONASE) 50 MCG/ACT nasal spray Place 2 sprays into both nostrils daily as needed for allergies. 02/12/17   [provider]  HYDROcodone -acetaminophen  (NORCO/VICODIN) 5-325 MG tablet Take 1 tablet by mouth every 6 (six) hours as needed for moderate pain (pain score 4-6).    [provider]  hydrOXYzine  (ATARAX /VISTARIL ) 25 MG tablet Take 25 mg by mouth See admin instructions. 25 mg at bedtime 25 mg during the day as needed for itching/anxiety    [provider]  loratadine  (CLARITIN ) 10 MG tablet Take 10 mg by mouth daily.    [provider]  montelukast  (SINGULAIR ) 10 MG tablet Take 10 mg by mouth at bedtime.    [provider]  nystatin  powder APPLY  POWDER TOPICALLY TO AFFECTED AREA THREE TIMES DAILY 01/05/24   [name], MD  ondansetron  (ZOFRAN -ODT) 4 MG disintegrating tablet DISSOLVE 1 TABLET IN MOUTH EVERY 8 HOURS AS NEEDED FOR NAUSEA AND VOMITING 09/08/23   [name], MD  osimertinib  mesylate (TAGRISSO ) 80 MG  tablet Take 1 tablet (80 mg total) by mouth daily. 11/26/23   [name], MD  prochlorperazine  (COMPAZINE ) 10 MG tablet TAKE 1 TABLET BY MOUTH EVERY 6 HOURS AS NEEDED FOR NAUSEA FOR VOMITING 01/14/22   [name], MD  solifenacin (VESICARE) 10 MG tablet Take 10 mg by mouth every evening.    [provider]  traMADol  (ULTRAM ) 50 MG tablet Take 1 tablet (50 mg total) by mouth every 6 (six) hours as needed. 01/06/24   Bruning, Ashlyn, PA-C  triamcinolone cream (KENALOG) 0.1 % Apply 1 Application topically 2 (two) times daily.    [provider]  zolpidem  (AMBIEN ) 10 MG tablet TAKE 1/2 TO 1 (ONE-HALF TO ONE) TABLET BY MOUTH AT BEDTIME AS NEEDED FOR SLEEP 11/26/23   [name], MD    Physical Exam: BP 132/63   Pulse 86   Temp 98.5 F (36.9 C)   Resp 18   SpO2 98%  General: Pleasant, well-appearing *** laying in bed. No acute  distress. HEENT: [place]/AT. Anicteric sclera CV: RRR. No murmurs, rubs, or gallops. No LE edema Pulmonary: Lungs CTAB. Normal effort. No wheezing or rales. Abdominal: Soft, nontender, nondistended. Normal bowel sounds. Extremities: Palpable radial and DP pulses. Normal ROM. Skin: Warm and dry. No obvious rash or lesions. Neuro: A&Ox3. Moves all extremities. Normal sensation to light touch. No focal deficit. Psych: Normal mood and affect          Labs on Admission:  Basic Metabolic Panel: Recent Labs  Lab 02/16/24 1738  NA 136  K 4.4  CL 101  CO2 21*  GLUCOSE 103*  BUN 18  CREATININE 1.22*  CALCIUM 9.9   Liver Function Tests: Recent Labs  Lab 02/16/24 1738  AST 31  ALT 63*  ALKPHOS 151*  BILITOT 0.4  PROT 7.5  ALBUMIN 4.1   No results for input(s): LIPASE, AMYLASE in the last 168 hours. No results for input(s): AMMONIA in the last 168 hours. CBC: Recent Labs  Lab 02/16/24 1738  WBC 4.3  NEUTROABS 3.0  HGB 11.5*  HCT 37.0  MCV 87.7  PLT 224   Cardiac Enzymes: No results for input(s): CKTOTAL, CKMB, CKMBINDEX, TROPONINI  in the last 168 hours. BNP (last 3 results) No results for input(s): BNP in the last 8760 hours.  ProBNP (last 3 results) No results for input(s): PROBNP in the last 8760 hours.  CBG: No results for input(s): GLUCAP in the last 168 hours.  Radiological Exams on Admission: CT ABDOMEN PELVIS W CONTRAST Result Date: 02/16/2024 CLINICAL DATA:  abdominal wall abscess EXAM: CT ABDOMEN AND PELVIS WITH CONTRAST TECHNIQUE: Multidetector CT imaging of the abdomen and pelvis was performed using the standard protocol following bolus administration of intravenous contrast. RADIATION DOSE REDUCTION: This exam was performed according to the departmental dose-optimization program which includes automated exposure control, adjustment of the mA and/or kV according to patient size and/or use of iterative reconstruction technique. CONTRAST:  OMNIPAQUE  IOHEXOL  300 MG/ML  SOLN COMPARISON:  CT abdomen pelvis 10/19/2023 FINDINGS: Lower chest: No acute abnormality. Hepatobiliary: No focal liver abnormality. Status post cholecystectomy. No biliary dilatation. Pancreas: No focal lesion. Normal pancreatic contour. No surrounding inflammatory changes. No main pancreatic ductal dilatation. Spleen: Normal in size without focal abnormality. Adrenals/Urinary Tract: No adrenal nodule bilaterally. Bilateral kidneys enhance symmetrically. No hydronephrosis. No hydroureter. The urinary bladder is unremarkable. Stomach/Bowel: Stomach is within normal limits. No evidence of bowel wall thickening or dilatation. Appendix appears normal. Vascular/Lymphatic: No abdominal aorta or iliac aneurysm. Mild atherosclerotic plaque. No abdominal, pelvic, or inguinal lymphadenopathy. Reproductive: Status post hysterectomy. No adnexal masses. Other: No intraperitoneal free fluid. No intraperitoneal free gas. No organized fluid collection. Musculoskeletal: Right anterior abdominal wall edema (2:40). No soft tissue abdominal wall organized fluid  collection. No suspicious lytic or blastic osseous lesions. No acute displaced fracture. IMPRESSION: 1. Right anterior abdominal wall edema.  No abscess. 2. No acute intra-abdominal or intrapelvic abnormality. Electronically Signed   By: Morgane  Naveau M.D.   On: 02/16/2024 23:05   DG Chest Portable 1 View Result Date: 02/16/2024 CLINICAL DATA:  Cough EXAM: PORTABLE CHEST 1 VIEW COMPARISON:  CT chest abdomen and pelvis 01/26/2024 FINDINGS: Normal heart size and pulmonary vascularity. Left paratracheal soft tissue fullness corresponding to masslike consolidation on prior CT, likely post treatment changes. Right lung is clear. No pleural effusion or pneumothorax. IMPRESSION: Prominent left paratracheal soft tissue is unchanged since prior CT, likely post treatment changes. No developing airspace disease or consolidation in the lungs. Electronically Signed  By: Elsie Gravely M.D.   On: 02/16/2024 18:18   Assessment/Plan Abigail Golden is a 58 y.o. 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 Observation status is judged to be reasonable and necessary in order to provide the required intensity of service to ensure the patient's safety. The patient's presenting symptoms, physical exam findings, and initial radiographic and laboratory data in the context  of their medical condition is felt to place them at decreased risk for further clinical deterioration. Furthermore, it is anticipated that the patient will be medically stable for discharge from the hospital within 2 midnights of admission.   Level of care: Med-Surg   This record has been created using Conservation officer, historic buildings. Errors have been sought and corrected, but may not always be located. Such creation errors do not reflect on the standard of care.   Lou Claretta HERO, MD 02/16/2024, 11:22 PM [facility] Pager: [phone] Isaiah 41:10   If 7PM-7AM, please contact night-coverage [email or web address] Password [email or web address]

## 2024-02-16 NOTE — ED Notes (Signed)
 Patient transported to CT

## 2024-02-16 NOTE — ED Triage Notes (Signed)
 Patient presents due to MRSA on her abdomin. She noticed the sore about a week ago. A fever, cough, general body aches and nausea started within a weeks time. Primary MD would like her placed on a different antibiotic.   EMS vitals: 130/70 BP 82 P 18 RR 99% SPO2 on room air 117 CBG

## 2024-02-16 NOTE — ED Provider Notes (Signed)
 [place] EMERGENCY DEPARTMENT AT [facility] Provider Note   CSN: [id number] Arrival date & time: 02/16/24  1624     Patient presents with: infection   Abigail Golden is a 58 y.o. female.   HPI Presents with abscess on abdomen.  MRSA positive.  Had been on chemotherapy for lung and brain cancer.  Has been off for a week now.  Has had nausea.  Has had fevers and now getting cough.  Has been on doxycycline for around a week.  PCP sent in for further evaluation.  Has had some drainage.  Feels more tender than previous    Prior to Admission medications   Medication Sig Start Date End Date Taking? Authorizing Provider  acetaminophen  (TYLENOL ) 500 MG tablet Take 500 mg by mouth every 6 (six) hours as needed for moderate pain or headache.    [provider]  albuterol  (VENTOLIN  HFA) 108 (90 Base) MCG/ACT inhaler Inhale 2 puffs into the lungs every 4 (four) hours as needed. 06/21/19   [provider]  ALPRAZolam  (XANAX ) 0.25 MG tablet Take 0.25 mg by mouth every 4 (four) hours as needed for anxiety. for anxiety 07/16/18   [provider]  aspirin EC 81 MG tablet Take 81 mg by mouth daily. Swallow whole.    [provider]  citalopram  (CELEXA ) 10 MG tablet Take 10 mg by mouth at bedtime. 08/16/18   [provider]  Diclofenac Potassium,Migraine, 50 MG PACK Take 1 each by mouth as needed. As needed for migraines    [provider]  diclofenac sodium (VOLTAREN) 1 % GEL Apply topically as needed. To affected area    [provider]  diphenhydrAMINE  (BENADRYL ) 25 MG tablet Take 25 mg by mouth See admin instructions. 25 mg at bedtime 25 mg as needed for itching    [provider]  Docusate Sodium  (STOOL SOFTENER LAXATIVE PO) Take 1 tablet by mouth as needed.    [provider]  eletriptan (RELPAX) 20 MG tablet Take 20 mg by mouth as needed for headache (at onset of migraine.). May repeat in 2 hours if needed.     [provider]  fluticasone (FLONASE) 50 MCG/ACT nasal spray Place 2 sprays into both nostrils daily as needed for allergies. 02/12/17   [provider]  HYDROcodone -acetaminophen  (NORCO/VICODIN) 5-325 MG tablet Take 1 tablet by mouth every 6 (six) hours as needed for moderate pain (pain score 4-6).    [provider]  hydrOXYzine  (ATARAX /VISTARIL ) 25 MG tablet Take 25 mg by mouth See admin instructions. 25 mg at bedtime 25 mg during the day as needed for itching/anxiety    [provider]  loratadine  (CLARITIN ) 10 MG tablet Take 10 mg by mouth daily.    [provider]  montelukast  (SINGULAIR ) 10 MG tablet Take 10 mg by mouth at bedtime.    [provider]  nystatin  powder APPLY  POWDER TOPICALLY TO AFFECTED AREA THREE TIMES DAILY 01/05/24   [name], MD  ondansetron  (ZOFRAN -ODT) 4 MG disintegrating tablet DISSOLVE 1 TABLET IN MOUTH EVERY 8 HOURS AS NEEDED FOR NAUSEA AND VOMITING 09/08/23   [name], MD  osimertinib  mesylate (TAGRISSO ) 80 MG tablet Take 1 tablet (80 mg total) by mouth daily. 11/26/23   [name], MD  prochlorperazine  (COMPAZINE ) 10 MG tablet TAKE 1 TABLET BY MOUTH EVERY 6 HOURS AS NEEDED FOR NAUSEA FOR VOMITING 01/14/22   [name], MD  solifenacin (VESICARE) 10 MG tablet Take 10 mg by mouth  every evening.    [provider]  traMADol  (ULTRAM ) 50 MG tablet Take 1 tablet (50 mg total) by mouth every 6 (six) hours as needed. 01/06/24   Bruning, Ashlyn, PA-C  triamcinolone cream (KENALOG) 0.1 % Apply 1 Application topically 2 (two) times daily.    [provider]  zolpidem  (AMBIEN ) 10 MG tablet TAKE 1/2 TO 1 (ONE-HALF TO ONE) TABLET BY MOUTH AT BEDTIME AS NEEDED FOR SLEEP 11/26/23   [name], MD    Allergies: Celebrex [celecoxib] and Minocycline    Review of Systems  Updated Vital Signs BP 132/63   Pulse 86   Temp 98.5 F (36.9 C)   Resp 18   SpO2 98%   Physical Exam Vitals reviewed.  HENT:     Head:  Atraumatic.  Cardiovascular:     Rate and Rhythm: Normal rate.  Pulmonary:     Comments: Occasional cough but no frank rales or rhonchi. Abdominal:     Tenderness: There is abdominal tenderness.     Comments: Along fold on the abdomen there is a tender indurated area with some fluctuance.  Goes from more medial aspect more laterally along the fold for 15 to 20 cm.  Neurological:     Mental Status: She is alert.     (all labs ordered are listed, but only abnormal results are displayed) Labs Reviewed  SARS CORONAVIRUS 2 BY RT PCR - Abnormal; Notable for the following components:      Result Value   SARS Coronavirus 2 by RT PCR POSITIVE (*)    All other components within normal limits  COMPREHENSIVE METABOLIC PANEL WITH GFR - Abnormal; Notable for the following components:   CO2 21 (*)    Glucose, Bld 103 (*)    Creatinine, Ser 1.22 (*)    ALT 63 (*)    Alkaline Phosphatase 151 (*)    GFR, Estimated 52 (*)    All other components within normal limits  CBC WITH DIFFERENTIAL/PLATELET - Abnormal; Notable for the following components:   Hemoglobin 11.5 (*)    All other components within normal limits  I-STAT CG4 LACTIC ACID, ED - Abnormal; Notable for the following components:   Lactic Acid, Venous 0.4 (*)    All other components within normal limits  CULTURE, BLOOD (ROUTINE X 2)  CULTURE, BLOOD (ROUTINE X 2)  I-STAT CG4 LACTIC ACID, ED    EKG: None  Radiology: DG Chest Portable 1 View Result Date: 02/16/2024 CLINICAL DATA:  Cough EXAM: PORTABLE CHEST 1 VIEW COMPARISON:  CT chest abdomen and pelvis 01/26/2024 FINDINGS: Normal heart size and pulmonary vascularity. Left paratracheal soft tissue fullness corresponding to masslike consolidation on prior CT, likely post treatment changes. Right lung is clear. No pleural effusion or pneumothorax. IMPRESSION: Prominent left paratracheal soft tissue is unchanged since prior CT, likely post treatment changes. No developing airspace disease  or consolidation in the lungs. Electronically Signed   By: Elsie Gravely M.D.   On: 02/16/2024 18:18     Procedures   Medications Ordered in the ED  vancomycin  (VANCOCIN ) IVPB 1000 mg/200 mL premix (has no administration in time range)  vancomycin  (VANCOREADY) IVPB 2000 mg/400 mL (0 mg Intravenous Stopped 02/16/24 1945)  iohexol  (OMNIPAQUE ) 300 MG/ML solution 100 mL (100 mLs Intravenous Contrast Given 02/16/24 2222)  ondansetron  (ZOFRAN ) injection 4 mg (4 mg Intravenous Given 02/16/24 1925)  Medical Decision Making Amount and/or Complexity of Data Reviewed Labs: ordered. Radiology: ordered.  Risk Prescription drug management.   Patient with likely abscess of abdomen.  Has had some drainage.  Has had some systemic symptoms but also having cough which could be from other causes such as COVID.  Has been on doxycycline.  Known MRSA infection by culture  omponent 7 d ago  Aerobic Culture Moderate Growth Methicillin Resistant Staphylococcus aureus (MRSA) Abnormal   Gram Stain Result 1+ Gram positive cocci in pairs Abnormal   Resulting Agency [facility](CLIA# [id number])  Susceptibility  Organism Antibiotic Method Susceptibility  Methicillin Resistant Staphylococcus aureus (MRSA) MALDI ID MIC   Methicillin Resistant Staphylococcus aureus (MRSA) Clindamycin  MIC <=0.25 ug/ml: Susceptible  Methicillin Resistant Staphylococcus aureus (MRSA) Erythromycin MIC <=0.25 ug/ml: Susceptible  Methicillin Resistant Staphylococcus aureus (MRSA) Gentamicin MIC <=1 ug/ml: Susceptible  Methicillin Resistant Staphylococcus aureus (MRSA) Oxacillin MIC >2 ug/ml: Resistant   Comment: Oxacillin-resistant Staphylococcus sp. are resistant to all carbapenems, cephems and other Beta-lactam/lactamase inhibitor combinations; such as Amoxicillin/Clavulanic acid, Pip/Tazo and Imipenem.  Methicillin Resistant Staphylococcus aureus (MRSA) Tetracycline  MIC <=2 ug/ml: Susceptible  Methicillin Resistant Staphylococcus aureus (MRSA) Trimethoprim  + Sulfamethoxazole  MIC <=0.5/9.5 ug/ml: Susceptible  Methicillin Resistant Staphylococcus aureus (MRSA) Vancomycin  MIC 1 ug/ml: Susceptib   Will get blood work.  Will get CT scan to further evaluate.  Likely will require admission to hospital.  White count reassuring.  Lactic acid reassuring.  However COVID test did come back positive.  Likely the cause of some of the symptoms.  However with the abscess versus cellulitis on the abdomen.  Looks like there has been some drainage.  There is the induration that has increased.    Ct Scan done and per my read do not see a large fluid collection.  Will discuss with hospitalist for admission.      Final diagnoses:  COVID-19  Cellulitis, abdominal wall    ED Discharge Orders     None          Patsey Lot, MD 02/16/24 2304

## 2024-02-16 NOTE — Progress Notes (Signed)
 Pharmacy Antibiotic Note  Abigail Golden is a 58 y.o. female admitted on 02/16/2024 with abdominal abscess (patient notified by PCP that it grew MRSA). Patient has been taking doxycycline and an antibiotic ointment outpatient for treatment. Pharmacy has been consulted for vancomycin  dosing.  Plan: - Give vancomycin  2000mg  IV x1, followed by vancomycin  1000mg  IV q24hrs (eAUC 472 using Scr 1.22 and Vd 0.5) - Vanc levels PRN - Monitor renal function, cultures, and overall clinical picture - De-escalate abx as able      Temp (24hrs), Avg:98.7 F (37.1 C), Min:98.7 F (37.1 C), Max:98.7 F (37.1 C)  No results for input(s): WBC, CREATININE, LATICACIDVEN, VANCOTROUGH, VANCOPEAK, VANCORANDOM, GENTTROUGH, GENTPEAK, GENTRANDOM, TOBRATROUGH, TOBRAPEAK, TOBRARND, AMIKACINPEAK, AMIKACINTROU, AMIKACIN in the last 168 hours.  CrCl cannot be calculated (Patient's most recent lab result is older than the maximum 21 days allowed.).    Allergies  Allergen Reactions   Celebrex [Celecoxib] Itching and Rash    Facial itching   Minocycline Itching and Rash    Facial itching     Antimicrobials this admission: 8/26 vancomycin  >>    Dose adjustments this admission: N/A  Microbiology results: 8/26 BCx: not yet collected   Thank you for allowing pharmacy to be a part of this patient's care.  Lacinda Moats, PharmD Clinical Pharmacist  8/26/20255:06 PM

## 2024-02-17 DIAGNOSIS — G43909 Migraine, unspecified, not intractable, without status migrainosus: Secondary | ICD-10-CM | POA: Diagnosis present

## 2024-02-17 DIAGNOSIS — N1832 Chronic kidney disease, stage 3b: Secondary | ICD-10-CM | POA: Diagnosis present

## 2024-02-17 DIAGNOSIS — Z8701 Personal history of pneumonia (recurrent): Secondary | ICD-10-CM | POA: Diagnosis not present

## 2024-02-17 DIAGNOSIS — U071 COVID-19: Secondary | ICD-10-CM | POA: Diagnosis present

## 2024-02-17 DIAGNOSIS — L0292 Furuncle, unspecified: Secondary | ICD-10-CM | POA: Diagnosis present

## 2024-02-17 DIAGNOSIS — Z7982 Long term (current) use of aspirin: Secondary | ICD-10-CM | POA: Diagnosis not present

## 2024-02-17 DIAGNOSIS — J4489 Other specified chronic obstructive pulmonary disease: Secondary | ICD-10-CM | POA: Diagnosis present

## 2024-02-17 DIAGNOSIS — Z79899 Other long term (current) drug therapy: Secondary | ICD-10-CM | POA: Diagnosis not present

## 2024-02-17 DIAGNOSIS — Z9049 Acquired absence of other specified parts of digestive tract: Secondary | ICD-10-CM | POA: Diagnosis not present

## 2024-02-17 DIAGNOSIS — Z90710 Acquired absence of both cervix and uterus: Secondary | ICD-10-CM | POA: Diagnosis not present

## 2024-02-17 DIAGNOSIS — G4700 Insomnia, unspecified: Secondary | ICD-10-CM | POA: Diagnosis present

## 2024-02-17 DIAGNOSIS — Z881 Allergy status to other antibiotic agents status: Secondary | ICD-10-CM | POA: Diagnosis not present

## 2024-02-17 DIAGNOSIS — Z8719 Personal history of other diseases of the digestive system: Secondary | ICD-10-CM | POA: Diagnosis not present

## 2024-02-17 DIAGNOSIS — Y92230 Patient room in hospital as the place of occurrence of the external cause: Secondary | ICD-10-CM | POA: Diagnosis not present

## 2024-02-17 DIAGNOSIS — F419 Anxiety disorder, unspecified: Secondary | ICD-10-CM | POA: Diagnosis present

## 2024-02-17 DIAGNOSIS — C7931 Secondary malignant neoplasm of brain: Secondary | ICD-10-CM | POA: Diagnosis present

## 2024-02-17 DIAGNOSIS — C3490 Malignant neoplasm of unspecified part of unspecified bronchus or lung: Secondary | ICD-10-CM

## 2024-02-17 DIAGNOSIS — Y848 Other medical procedures as the cause of abnormal reaction of the patient, or of later complication, without mention of misadventure at the time of the procedure: Secondary | ICD-10-CM | POA: Diagnosis not present

## 2024-02-17 DIAGNOSIS — Z923 Personal history of irradiation: Secondary | ICD-10-CM | POA: Diagnosis not present

## 2024-02-17 DIAGNOSIS — L03311 Cellulitis of abdominal wall: Secondary | ICD-10-CM | POA: Diagnosis present

## 2024-02-17 DIAGNOSIS — Z886 Allergy status to analgesic agent status: Secondary | ICD-10-CM | POA: Diagnosis not present

## 2024-02-17 DIAGNOSIS — Z6841 Body Mass Index (BMI) 40.0 and over, adult: Secondary | ICD-10-CM | POA: Diagnosis not present

## 2024-02-17 DIAGNOSIS — F32A Depression, unspecified: Secondary | ICD-10-CM | POA: Diagnosis present

## 2024-02-17 DIAGNOSIS — Z8249 Family history of ischemic heart disease and other diseases of the circulatory system: Secondary | ICD-10-CM | POA: Diagnosis not present

## 2024-02-17 DIAGNOSIS — T8089XA Other complications following infusion, transfusion and therapeutic injection, initial encounter: Secondary | ICD-10-CM | POA: Diagnosis not present

## 2024-02-17 DIAGNOSIS — R509 Fever, unspecified: Secondary | ICD-10-CM | POA: Diagnosis present

## 2024-02-17 DIAGNOSIS — E66813 Obesity, class 3: Secondary | ICD-10-CM | POA: Diagnosis present

## 2024-02-17 LAB — BASIC METABOLIC PANEL WITH GFR
Anion gap: 15 (ref 5–15)
BUN: 17 mg/dL (ref 6–20)
CO2: 17 mmol/L — ABNORMAL LOW (ref 22–32)
Calcium: 9 mg/dL (ref 8.9–10.3)
Chloride: 104 mmol/L (ref 98–111)
Creatinine, Ser: 1.15 mg/dL — ABNORMAL HIGH (ref 0.44–1.00)
GFR, Estimated: 56 mL/min — ABNORMAL LOW (ref >60)
Glucose, Bld: 104 mg/dL — ABNORMAL HIGH (ref 70–99)
Potassium: 4.3 mmol/L (ref 3.5–5.1)
Sodium: 135 mmol/L (ref 135–145)

## 2024-02-17 LAB — CBC
HCT: 32.4 % — ABNORMAL LOW (ref 36.0–46.0)
Hemoglobin: 10.4 g/dL — ABNORMAL LOW (ref 12.0–15.0)
MCH: 28.5 pg (ref 26.0–34.0)
MCHC: 32.1 g/dL (ref 30.0–36.0)
MCV: 88.8 fL (ref 80.0–100.0)
Platelets: 186 K/uL (ref 150–400)
RBC: 3.65 MIL/uL — ABNORMAL LOW (ref 3.87–5.11)
RDW: 13.6 % (ref 11.5–15.5)
WBC: 4.7 K/uL (ref 4.0–10.5)
nRBC: 0 % (ref 0.0–0.2)

## 2024-02-17 MED ORDER — BENZONATATE 100 MG PO CAPS
200.0000 mg | ORAL_CAPSULE | Freq: Three times a day (TID) | ORAL | Status: DC | PRN
  Administered 2024-02-17 – 2024-02-18 (×3): 200 mg via ORAL
  Filled 2024-02-17 (×4): qty 2

## 2024-02-17 MED ORDER — LINEZOLID 600 MG PO TABS
600.0000 mg | ORAL_TABLET | Freq: Two times a day (BID) | ORAL | Status: DC
  Administered 2024-02-18 – 2024-02-19 (×2): 600 mg via ORAL
  Filled 2024-02-17 (×3): qty 1

## 2024-02-17 MED ORDER — DIPHENHYDRAMINE HCL 25 MG PO CAPS
25.0000 mg | ORAL_CAPSULE | Freq: Every day | ORAL | Status: DC
  Administered 2024-02-17 – 2024-02-18 (×3): 25 mg via ORAL
  Filled 2024-02-17 (×3): qty 1

## 2024-02-17 MED ORDER — HYDROXYZINE HCL 25 MG PO TABS: 25.0000 mg | ORAL_TABLET | Freq: Every day | ORAL | Status: DC | PRN

## 2024-02-17 MED ORDER — HYALURONIDASE HUMAN 150 UNIT/ML IJ SOLN
150.0000 [IU] | Freq: Once | INTRAMUSCULAR | Status: AC
  Administered 2024-02-17: 150 [IU] via SUBCUTANEOUS
  Filled 2024-02-17: qty 1

## 2024-02-17 MED ORDER — DIPHENHYDRAMINE HCL 25 MG PO CAPS: 25.0000 mg | ORAL_CAPSULE | Freq: Every day | ORAL | Status: DC | PRN

## 2024-02-17 MED ORDER — TRAMADOL HCL 50 MG PO TABS
50.0000 mg | ORAL_TABLET | Freq: Four times a day (QID) | ORAL | Status: DC | PRN
  Administered 2024-02-17 – 2024-02-18 (×2): 50 mg via ORAL
  Filled 2024-02-17 (×3): qty 1

## 2024-02-17 MED ORDER — ZOLPIDEM TARTRATE 5 MG PO TABS
5.0000 mg | ORAL_TABLET | Freq: Every evening | ORAL | Status: DC | PRN
  Administered 2024-02-17 – 2024-02-18 (×2): 5 mg via ORAL
  Filled 2024-02-17 (×2): qty 1

## 2024-02-17 MED ORDER — HYDROXYZINE HCL 25 MG PO TABS
25.0000 mg | ORAL_TABLET | Freq: Every day | ORAL | Status: DC
  Administered 2024-02-17 – 2024-02-18 (×3): 25 mg via ORAL
  Filled 2024-02-17 (×3): qty 1

## 2024-02-17 MED ORDER — CITALOPRAM HYDROBROMIDE 20 MG PO TABS
10.0000 mg | ORAL_TABLET | Freq: Every day | ORAL | Status: DC
  Administered 2024-02-17 – 2024-02-18 (×2): 10 mg via ORAL
  Filled 2024-02-17 (×2): qty 1

## 2024-02-17 MED ORDER — MUPIROCIN 2 % EX OINT
1.0000 | TOPICAL_OINTMENT | Freq: Three times a day (TID) | CUTANEOUS | Status: DC
  Administered 2024-02-17 – 2024-02-19 (×7): 1 via TOPICAL
  Filled 2024-02-17 (×2): qty 22

## 2024-02-17 MED ORDER — MONTELUKAST SODIUM 10 MG PO TABS
10.0000 mg | ORAL_TABLET | Freq: Every day | ORAL | Status: DC
  Administered 2024-02-17 – 2024-02-18 (×2): 10 mg via ORAL
  Filled 2024-02-17 (×2): qty 1

## 2024-02-17 MED ORDER — FESOTERODINE FUMARATE ER 4 MG PO TB24
4.0000 mg | ORAL_TABLET | Freq: Every evening | ORAL | Status: DC
  Administered 2024-02-17 – 2024-02-18 (×2): 4 mg via ORAL
  Filled 2024-02-17 (×3): qty 1

## 2024-02-17 MED ORDER — IPRATROPIUM-ALBUTEROL 0.5-2.5 (3) MG/3ML IN SOLN: 3.0000 mL | Freq: Four times a day (QID) | RESPIRATORY_TRACT | Status: DC | PRN

## 2024-02-17 NOTE — Care Management Obs Status (Signed)
 MEDICARE OBSERVATION STATUS NOTIFICATION   Patient Details  Name: GIANNAH ZAVADIL MRN: [id number] Date of Birth: July 02, 1965   Medicare Observation Status Notification Given:  Yes    Doneta Glenys DASEN, RN 02/17/2024, 11:17 AM

## 2024-02-17 NOTE — ED Notes (Signed)
Pharmacy at bedside

## 2024-02-17 NOTE — TOC Initial Note (Signed)
 Transition of Care [facility]) - Initial/Assessment Note    Patient Details  Name: Abigail Golden MRN: [id number] Date of Birth: July 13, 1965  [facility] CM/SW Contact:    Doneta Glenys DASEN, RN Phone Number: 02/17/2024, 11:21 AM  Clinical Narrative:                 MOON completed. Presented for MRSA on her abdominal wound per PCP request. Patient states PTA lives in a house alone; PCP/insurance verified;denies DME, HH, oxygen and SDOH needs; patients friend MRSA on her abd will transport home at discharge. Inpatient case management will follow progression to discharge.  Expected Discharge Plan: Home/Self Care Barriers to Discharge: No Barriers Identified   Patient Goals and CMS Choice Patient states their goals for this hospitalization and ongoing recovery are:: Home alone CMS Medicare.gov Compare Post Acute Care list provided to::  (NA) Choice offered to / list presented to : NA [place] ownership interest in [facility].provided to:: Parent NA    Expected Discharge Plan and Services In-house Referral: NA Discharge Planning Services: CM Consult Post Acute Care Choice: NA Living arrangements for the past 2 months: Single Family Home                 DME Arranged: N/A DME Agency: NA       HH Arranged: NA HH Agency: NA        Prior Living Arrangements/Services Living arrangements for the past 2 months: Single Family Home Lives with:: Self Patient language and need for interpreter reviewed:: Yes Do you feel safe going back to the place where you live?: Yes      Need for Family Participation in Patient Care: No (Comment) Care giver support system in place?: Yes (comment) Current home services:  (denies) Criminal Activity/Legal Involvement Pertinent to Current Situation/Hospitalization: No - Comment as needed  Activities of Daily Living   ADL Screening (condition at time of admission) Independently performs ADLs?: Yes (appropriate for developmental  age) Is the patient deaf or have difficulty hearing?: No Does the patient have difficulty seeing, even when wearing glasses/contacts?: No Does the patient have difficulty concentrating, remembering, or making decisions?: No  Permission Sought/Granted Permission sought to share information with : Case Manager Permission granted to share information with : Yes, Verbal Permission Granted  Share Information with NAME: BLANCA PULLING [place])  [phone]           Emotional Assessment Appearance:: Other (Comment Required (telephone visit) Attitude/Demeanor/Rapport: Engaged Affect (typically observed): Appropriate Orientation: : Oriented to Self, Oriented to Place, Oriented to  Time, Oriented to Situation Alcohol / Substance Use: Not Applicable Psych Involvement: No (comment)  Admission diagnosis:  Cellulitis, abdominal wall [L03.311] Abdominal wall cellulitis [L03.311] COVID-19 [U07.1] 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) 05/03/2021   Lung mass 04/24/2021   Malignant neoplasm metastatic to brain (HCC) 04/23/2021   Chronic intractable headache 09/09/2018   Chronic migraine without aura without status migrainosus, not intractable 09/09/2018   Morbid obesity with BMI of 45.0-49.9, adult (HCC) 09/09/2018   Medication overuse headache 09/09/2018   Cholecystitis 05/25/2015   PCP:  Debrah,  Josette ORN., PA-C Pharmacy:   [facility] [address], [place] - [address] [place] [address] Phone: [phone] Fax: [phone]  [facility] [address] [place] [address] Phone: [phone] Fax: [phone]  [facility] [address] [place] [address] Phone: [phone] Fax: [phone]     Social Drivers of Health (SDOH) Social History: SDOH Screenings   Food Insecurity: No Food Insecurity (02/17/2024)  Housing: Low Risk  (02/17/2024)  Transportation Needs: No Transportation Needs (02/17/2024)  Utilities: Not At Risk (02/17/2024)  Depression (PHQ2-9): Low Risk  (02/04/2024)  Financial Resource Strain: Medium Risk (11/12/2021)  Tobacco Use: Low Risk  (02/16/2024)   SDOH Interventions:     Readmission Risk Interventions    10/20/2023   10:22 AM  Readmission Risk Prevention Plan  Transportation Screening Complete  PCP or Specialist Appt within 5-7 Days Complete  Home Care Screening Complete  Medication Review (RN CM) Complete

## 2024-02-17 NOTE — Progress Notes (Addendum)
 IV site found to be extravasated. Stopped infusion immediately and contacted pharmacy. Pharmacy recommended removal of IV site, and application of heat and elevation. Aspirated as much fluid from around the site as possible, then removed catheter. Placed hot pack on site. Administered 5 injections of hyaluronidase  around the perimeter of the site. Patient tolerated well, and is not complaining of pain. Some bright red bruising noted at previous IV site, and soft area of swelling. Heat placed back on site of extravasation.

## 2024-02-17 NOTE — Progress Notes (Signed)
 PROGRESS NOTE    Abigail Golden  FMW:[id number] DOB: [birth year] DOA: 02/16/2024 PCP: [name], PA-C    Brief Narrative:   Abigail Golden is a 58 y.o. 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 Patient reports she developed a boil abdomen over a week ago. She was evaluated by her PCP and due to concern for surrounding cellulitis, wound culture was collected and patient was started on doxycycline. States the boil ruptured with drainage of pus and blood out on Sunday.  She reports intermittent watery stools for months however yesterday, she started having fevers, nausea, body aches, mild headache and a dry cough.  She received a call from her PCP to present to the ED due to wound culture being positive for MRSA. She denies any shortness of breath, chest pain, dizziness, dysuria, abdominal pain, or vomiting   In the ED, afebrile, normotensive with SpO2 97% on room air. Initial labs significant for creatinine 1.22, alk phos 151, WBC 4.3, Hgb 11.5, lactic acid 0.6-0.4, positive COVID-19 test. CXR shows no active disease. CT A/P showed right anterior abdominal wall edema but no abscess or intra-abdominal pathology. Abigail Golden received IV vancomycin  and IV Zofran . TRH was consulted for admission.     Assessment & Plan:   Abdominal wall cellulitis Immunocompromise patient with abdominal boil that has progressed with surrounding cellulitis found to have MRSA from wound culture at PCP office. She is afebrile and without leukocytosis, CT A/P with no evidence of intra-abdominal abscess -- Blood cultures x 2: No growth less than 24 hours -- Continue IV vancomycin  pharmacy consulted for dosing/monitoring   COVID-19 infection Presented with 2  days of fever, dry cough and bodyaches. COVID-19 PCR positive on admission.  Stable from respiratory standpoint, no O2 requirements and no infiltrate or consolidation on chest x-ray. -- Mucinex  twice daily for cough --Tylenol  as needed -- Incentive spirometer, flutter valve -- Airborne and contact precautions   Stage IV non-small cell lung cancer Brain metastases Patient with primary adenocarcinoma of the LUL diagnosed in 04/2021, complicated by brain mets status post radiation therapy, currently on chemotherapy. Restaging CT earlier this month showed stable disease with no progression -- Continue to hold Tagrisso  until follow-up with oncology in the outpatient   Asthma, chronic without acute exacerbation -- Continue Singulair  -- As needed DuoNebs   Anxiety and depression -- Continue Celexa  and as needed hydroxyzine    Insomnia -- Continue Ambien  as needed for sleep   Morbid obesity, class III Body mass index is 46.9 kg/m.   DVT prophylaxis: enoxaparin  (LOVENOX ) injection 40 mg Start: 02/17/24 1000    Code Status: Full Code Family Communication: No family present at bedside this morning  Disposition Plan:  Level of care: Med-Surg Status is: Inpatient Remains inpatient appropriate because: IV antibiotics    Consultants:  None  Procedures:  None  Antimicrobials:  Vancomycin  8/26>>   Subjective: Patient seen examined bedside, lying comfortably in bed.  No complaints this morning other than nonproductive cough.  Remains afebrile.  Continues on IV vancomycin .  Reviewed wound culture results in EMR/Care Everywhere.  Discussed anticipate likely discharge home on linezolid  to complete antibiotic course.  No other specific questions, concerns or complaints at this time.  Denies headache, no dizziness, no chest pain, no palpitations, no  fever/chills/night sweats, no nausea/vomiting/diarrhea, no focal weakness, no fatigue, no paresthesia.  No acute events overnight per nursing  staff.  Objective: Vitals:   02/17/24 0112 02/17/24 0519 02/17/24 0844 02/17/24 1307  BP:  131/67 129/62 119/63  Pulse:  81 77 83  Resp:  18 18 18   Temp:  98.9 F (37.2 C) 98.6 F (37 C) 99.3 F (37.4 C)  TempSrc:   Oral Oral  SpO2:  96% 98% 98%  Weight: 112.6 kg     Height: 5' 1 (1.549 m)       Intake/Output Summary (Last 24 hours) at 02/17/2024 1654 Last data filed at 02/17/2024 1512 Gross per 24 hour  Intake 1228.56 ml  Output --  Net 1228.56 ml   Filed Weights   02/17/24 0112  Weight: 112.6 kg    Examination:  Physical Exam: GEN: NAD, alert and oriented x 3, obese HEENT: NCAT, PERRL, EOMI, sclera clear, MMM PULM: CTAB w/o wheezes/crackles, normal respiratory effort, on room air with SpO2 90% at rest CV: RRR w/o M/G/R GI: abd soft, NTND, + BS MSK: no peripheral edema, muscle strength globally intact 5/5 bilateral upper/lower extremities NEURO: CN II-XII intact, no focal deficits, sensation to light touch intact PSYCH: normal mood/affect Integumentary: Small erythematous lesion noted to abdomen right upper abdomen, no fluctuance or purulent drainage, otherwise no other concerning rashes/lesions/wounds noted on exposed skin surfaces     Data Reviewed: I have personally reviewed following labs and imaging studies  CBC: Recent Labs  Lab 02/16/24 1738 02/17/24 0451  WBC 4.3 4.7  NEUTROABS 3.0  --   HGB 11.5* 10.4*  HCT 37.0 32.4*  MCV 87.7 88.8  PLT 224 186   Basic Metabolic Panel: Recent Labs  Lab 02/16/24 1738 02/17/24 0451  NA 136 135  K 4.4 4.3  CL 101 104  CO2 21* 17*  GLUCOSE 103* 104*  BUN 18 17  CREATININE 1.22* 1.15*  CALCIUM 9.9 9.0   GFR: Estimated Creatinine Clearance: 63.6 mL/min (A) (by C-G formula based on SCr of 1.15 mg/dL (H)). Liver Function Tests: Recent Labs  Lab 02/16/24 1738  AST 31  ALT 63*  ALKPHOS 151*  BILITOT 0.4  PROT 7.5  ALBUMIN 4.1   No results for input(s): LIPASE, AMYLASE in the last 168 hours. No  results for input(s): AMMONIA in the last 168 hours. Coagulation Profile: No results for input(s): INR, PROTIME in the last 168 hours. Cardiac Enzymes: No results for input(s): CKTOTAL, CKMB, CKMBINDEX, TROPONINI in the last 168 hours. BNP (last 3 results) No results for input(s): PROBNP in the last 8760 hours. HbA1C: No results for input(s): HGBA1C in the last 72 hours. CBG: No results for input(s): GLUCAP in the last 168 hours. Lipid Profile: No results for input(s): CHOL, HDL, LDLCALC, TRIG, CHOLHDL, LDLDIRECT in the last 72 hours. Thyroid Function Tests: No results for input(s): TSH, T4TOTAL, FREET4, T3FREE, THYROIDAB in the last 72 hours. Anemia Panel: No results for input(s): VITAMINB12, FOLATE, FERRITIN, TIBC, IRON, RETICCTPCT in the last 72 hours. Sepsis Labs: Recent Labs  Lab 02/16/24 1727 02/16/24 1936  LATICACIDVEN 0.6 0.4*    Recent Results (from the past 240 hours)  Culture, blood (routine x 2)     Status: None (Preliminary result)   Collection Time: 02/16/24  5:58 PM   Specimen: BLOOD  Result Value Ref Range Status   Specimen Description   Final    BLOOD LEFT ANTECUBITAL Performed at [facility], [address], [place], [place] [address]  Special Requests   Final    BOTTLES DRAWN AEROBIC AND ANAEROBIC Blood Culture adequate volume Performed at [facility], [address], [place], [place] [address]    Culture   Final    NO GROWTH < 12 HOURS Performed at [facility], [address], [place], [place] [address]    Report Status PENDING  Incomplete  SARS Coronavirus 2 by RT PCR (hospital order, performed in [facility] hospital lab) *cepheid single result test* Anterior Nasal Swab     Status: Abnormal   Collection Time: 02/16/24  7:25 PM   Specimen: Anterior Nasal Swab  Result Value Ref Range Status   SARS Coronavirus 2 by RT PCR POSITIVE (A) NEGATIVE Final     Comment: (NOTE) SARS-CoV-2 target nucleic acids are DETECTED  SARS-CoV-2 RNA is generally detectable in upper respiratory specimens  during the acute phase of infection.  Positive results are indicative  of the presence of the identified virus, but do not rule out bacterial infection or co-infection with other pathogens not detected by the test.  Clinical correlation with patient history and  other diagnostic information is necessary to determine patient infection status.  The expected result is negative.  Fact Sheet for Patients:   RoadLapTop.co.za   Fact Sheet for Healthcare Providers:   http://kim-miller.com/    This test is not yet approved or cleared by the [place]  FDA and  has been authorized for detection and/or diagnosis of SARS-CoV-2 by FDA under an Emergency Use Authorization (EUA).  This EUA will remain in effect (meaning this test can be used) for the duration of  the COVID-19 declaration under Section 564(b)(1)  of the Act, 21 U.S.C. section 360-bbb-3(b)(1), unless the authorization is terminated or revoked sooner.   Performed at [facility], [address] [place], [place] [address]   Culture, blood (routine x 2)     Status: None (Preliminary result)   Collection Time: 02/17/24  1:26 AM   Specimen: BLOOD RIGHT HAND  Result Value Ref Range Status   Specimen Description   Final    BLOOD RIGHT HAND BLOOD Performed at [facility], [address] [place], [place] [address]    Special Requests   Final    Blood Culture results may not be optimal due to an inadequate volume of blood received in culture bottles BOTTLES DRAWN AEROBIC ONLY Performed at [facility], [address], [place], [place] [address]    Culture   Final    NO GROWTH < 12 HOURS Performed at [facility], [address], [place], [place] [address]    Report Status PENDING   Incomplete         Radiology Studies: CT ABDOMEN PELVIS W CONTRAST Result Date: 02/16/2024 CLINICAL DATA:  abdominal wall abscess EXAM: CT ABDOMEN AND PELVIS WITH CONTRAST TECHNIQUE: Multidetector CT imaging of the abdomen and pelvis was performed using the standard protocol following bolus administration of intravenous contrast. RADIATION DOSE REDUCTION: This exam was performed according to the departmental dose-optimization program which includes automated exposure control, adjustment of the mA and/or kV according to patient size and/or use of iterative reconstruction technique. CONTRAST:  OMNIPAQUE  IOHEXOL  300 MG/ML  SOLN COMPARISON:  CT abdomen pelvis 10/19/2023 FINDINGS: Lower chest: No acute abnormality. Hepatobiliary: No focal liver abnormality. Status post cholecystectomy. No biliary dilatation. Pancreas: No focal lesion. Normal pancreatic contour. No surrounding inflammatory changes. No main pancreatic ductal dilatation. Spleen: Normal in size  without focal abnormality. Adrenals/Urinary Tract: No adrenal nodule bilaterally. Bilateral kidneys enhance symmetrically. No hydronephrosis. No hydroureter. The urinary bladder is unremarkable. Stomach/Bowel: Stomach is within normal limits. No evidence of bowel wall thickening or dilatation. Appendix appears normal. Vascular/Lymphatic: No abdominal aorta or iliac aneurysm. Mild atherosclerotic plaque. No abdominal, pelvic, or inguinal lymphadenopathy. Reproductive: Status post hysterectomy. No adnexal masses. Other: No intraperitoneal free fluid. No intraperitoneal free gas. No organized fluid collection. Musculoskeletal: Right anterior abdominal wall edema (2:40). No soft tissue abdominal wall organized fluid collection. No suspicious lytic or blastic osseous lesions. No acute displaced fracture. IMPRESSION: 1. Right anterior abdominal wall edema.  No abscess. 2. No acute intra-abdominal or intrapelvic abnormality. Electronically Signed   By: Morgane   Naveau M.D.   On: 02/16/2024 23:05   DG Chest Portable 1 View Result Date: 02/16/2024 CLINICAL DATA:  Cough EXAM: PORTABLE CHEST 1 VIEW COMPARISON:  CT chest abdomen and pelvis 01/26/2024 FINDINGS: Normal heart size and pulmonary vascularity. Left paratracheal soft tissue fullness corresponding to masslike consolidation on prior CT, likely post treatment changes. Right lung is clear. No pleural effusion or pneumothorax. IMPRESSION: Prominent left paratracheal soft tissue is unchanged since prior CT, likely post treatment changes. No developing airspace disease or consolidation in the lungs. Electronically Signed   By: Elsie Gravely M.D.   On: 02/16/2024 18:18        Scheduled Meds:  citalopram   10 mg Oral QHS   dextromethorphan -guaiFENesin   1 tablet Oral BID   diphenhydrAMINE   25 mg Oral QHS   enoxaparin  (LOVENOX ) injection  40 mg Subcutaneous Q24H   fesoterodine   4 mg Oral QPM   hydrOXYzine   25 mg Oral QHS   montelukast   10 mg Oral QHS   mupirocin  ointment  1 Application Topical TID   zolpidem   5 mg Oral QHS,MR X 1   Continuous Infusions:  sodium chloride  125 mL/hr at 02/17/24 1512   vancomycin        LOS: 0 days    Time spent: 50 minutes spent on 02/17/2024 caring for this patient face-to-face including chart review, ordering labs/tests, documenting, discussion with nursing staff, consultants, updating family and interview/physical exam    Camellia PARAS [place], DO [facility] Available via Epic secure chat 7am-7pm After these hours, please refer to coverage provider listed on [email or web address] 02/17/2024, 4:54 PM

## 2024-02-17 NOTE — Plan of Care (Signed)

## 2024-02-18 ENCOUNTER — Telehealth (HOSPITAL_COMMUNITY): Payer: Self-pay

## 2024-02-18 ENCOUNTER — Encounter: Payer: Self-pay | Admitting: Hematology

## 2024-02-18 ENCOUNTER — Other Ambulatory Visit (HOSPITAL_COMMUNITY): Payer: Self-pay

## 2024-02-18 ENCOUNTER — Other Ambulatory Visit: Payer: Self-pay

## 2024-02-18 DIAGNOSIS — L03311 Cellulitis of abdominal wall: Secondary | ICD-10-CM | POA: Diagnosis not present

## 2024-02-18 MED ORDER — HYDROCODONE BIT-HOMATROP MBR 5-1.5 MG/5ML PO SOLN
5.0000 mL | Freq: Four times a day (QID) | ORAL | Status: DC | PRN
  Administered 2024-02-18: 5 mL via ORAL
  Filled 2024-02-18: qty 5

## 2024-02-18 NOTE — Plan of Care (Signed)

## 2024-02-18 NOTE — Telephone Encounter (Signed)
 Pharmacy Patient Advocate Encounter  Insurance verification completed.    The patient is insured through [employer]. Patient has Medicare and is not eligible for a copay card, but may be able to apply for patient assistance or Medicare RX Payment Plan (Patient Must reach out to their plan, if eligible for payment plan), if available.    Ran test claim for Linezolid  and the current 30 day co-pay is $0.00.   This test claim was processed through [facility]- copay amounts may vary at other pharmacies due to pharmacy/plan contracts, or as the patient moves through the different stages of their insurance plan.

## 2024-02-18 NOTE — Progress Notes (Signed)
 PROGRESS NOTE    Abigail Golden  FMW:[id number] DOB: 1966-06-09 DOA: 02/16/2024 PCP: [name], PA-C    Brief Narrative:   Abigail Golden is a 58 y.o. 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 Patient reports she developed a boil abdomen over a week ago. She was evaluated by her PCP and due to concern for surrounding cellulitis, wound culture was collected and patient was started on doxycycline. States the boil ruptured with drainage of pus and blood out on Sunday.  She reports intermittent watery stools for months however yesterday, she started having fevers, nausea, body aches, mild headache and a dry cough.  She received a call from her PCP to present to the ED due to wound culture being positive for MRSA. She denies any shortness of breath, chest pain, dizziness, dysuria, abdominal pain, or vomiting   In the ED, afebrile, normotensive with SpO2 97% on room air. Initial labs significant for creatinine 1.22, alk phos 151, WBC 4.3, Hgb 11.5, lactic acid 0.6-0.4, positive COVID-19 test. CXR shows no active disease. CT A/P showed right anterior abdominal wall edema but no abscess or intra-abdominal pathology. Pt received IV vancomycin  and IV Zofran . TRH was consulted for admission.     Assessment & Plan:   Abdominal wall cellulitis Immunocompromise patient with abdominal boil that has progressed with surrounding cellulitis found to have MRSA from wound culture at PCP office. She is afebrile and without leukocytosis, CT A/P with no evidence of intra-abdominal abscess -- Blood cultures x 2: No growth less than 24 hours -- Linezolid  600 mg p.o. every 12 hours  Vancomycin  extravasation LUE Patient experienced vancomycin   extravasation to her left upper extremity IV site on 02/17/2024.  Infusion was discontinued immediately and heats pack and elevation of extremity was performed.  Aspiration by RN around the site of extravasation was performed and patient was administered 5 injections of hyaluronidase  surrounding the primary of the site which she tolerated well. --Continue to monitor left upper extremity extravasation site   COVID-19 infection Presented with 2 days of fever, dry cough and bodyaches. COVID-19 PCR positive on admission.  Stable from respiratory standpoint, no O2 requirements and no infiltrate or consolidation on chest x-ray. -- Mucinex  twice daily -- Tessalon  Perles 200 mg p.o. 3 times daily as needed cough -- Hycodan every 6 hours as needed cough not relieved with Tessalon  Perles  -- Tylenol  as needed -- Incentive spirometer, flutter valve -- Airborne and contact precautions   Stage IV non-small cell lung cancer Brain metastases Patient with primary adenocarcinoma of the LUL diagnosed in 04/2021, complicated by brain mets status post radiation therapy, currently on chemotherapy. Restaging CT earlier this month showed stable disease with no progression -- Continue to hold Tagrisso  until follow-up with oncology in the outpatient   Asthma, chronic without acute exacerbation -- Continue Singulair  -- As needed DuoNebs   Anxiety and depression -- Continue Celexa  and as needed hydroxyzine    Insomnia -- Continue Ambien  as needed for sleep   Morbid obesity, class III Body mass index is 46.9 kg/m.   DVT prophylaxis: enoxaparin  (LOVENOX ) injection 40 mg Start: 02/17/24 1000    Code Status: Full Code Family Communication: No family present at bedside this morning  Disposition Plan:  Level of care: Med-Surg Status  is: Inpatient Remains inpatient appropriate because: Need additional monitoring of vancomycin  extravasation site    Consultants:  None  Procedures:  None  Antimicrobials:   Vancomycin  8/26>>   Subjective: Patient seen examined bedside, lying comfortably in bed.  Unfortunately had vancomycin  extravasation at her IV site left lower extremity yesterday afternoon, treated with warm compresses, elevation hyaluronidase  injection.  Vancomycin  transition to linezolid .  No other specific questions, concerns or complaints at this time.  Denies headache, no dizziness, no chest pain, no palpitations, no fever/chills/night sweats, no nausea/vomiting/diarrhea, no focal weakness, no fatigue, no paresthesia.  No acute events overnight per nursing staff.  Objective: Vitals:   02/17/24 0844 02/17/24 1307 02/17/24 2020 02/18/24 0621  BP: 129/62 119/63 (!) 110/52 (!) 106/58  Pulse: 77 83 87 62  Resp: 18 18 18 16   Temp: 98.6 F (37 C) 99.3 F (37.4 C) 98.5 F (36.9 C) 97.8 F (36.6 C)  TempSrc: Oral Oral Oral   SpO2: 98% 98% 100% 97%  Weight:      Height:        Intake/Output Summary (Last 24 hours) at 02/18/2024 1159 Last data filed at 02/17/2024 1820 Gross per 24 hour  Intake 1482.94 ml  Output --  Net 1482.94 ml   Filed Weights   02/17/24 0112  Weight: 112.6 kg    Examination:  Physical Exam: GEN: NAD, alert and oriented x 3, obese HEENT: NCAT, PERRL, EOMI, sclera clear, MMM PULM: CTAB w/o wheezes/crackles, normal respiratory effort, on room air with SpO2 90% at rest CV: RRR w/o M/G/R GI: abd soft, NTND, + BS MSK: no peripheral edema, muscle strength globally intact 5/5 bilateral upper/lower extremities NEURO: CN II-XII intact, no focal deficits, sensation to light touch intact PSYCH: normal mood/affect Integumentary: Small erythematous lesion noted to abdomen right upper abdomen, no fluctuance or purulent drainage, otherwise no other concerning rashes/lesions/wounds noted on exposed skin surfaces     Data Reviewed: I have personally reviewed following labs and imaging studies  CBC: Recent Labs  Lab 02/16/24 1738 02/17/24 0451  WBC 4.3 4.7   NEUTROABS 3.0  --   HGB 11.5* 10.4*  HCT 37.0 32.4*  MCV 87.7 88.8  PLT 224 186   Basic Metabolic Panel: Recent Labs  Lab 02/16/24 1738 02/17/24 0451  NA 136 135  K 4.4 4.3  CL 101 104  CO2 21* 17*  GLUCOSE 103* 104*  BUN 18 17  CREATININE 1.22* 1.15*  CALCIUM 9.9 9.0   GFR: Estimated Creatinine Clearance: 63.6 mL/min (A) (by C-G formula based on SCr of 1.15 mg/dL (H)). Liver Function Tests: Recent Labs  Lab 02/16/24 1738  AST 31  ALT 63*  ALKPHOS 151*  BILITOT 0.4  PROT 7.5  ALBUMIN 4.1   No results for input(s): LIPASE, AMYLASE in the last 168 hours. No results for input(s): AMMONIA in the last 168 hours. Coagulation Profile: No results for input(s): INR, PROTIME in the last 168 hours. Cardiac Enzymes: No results for input(s): CKTOTAL, CKMB, CKMBINDEX, TROPONINI in the last 168 hours. BNP (last 3 results) No results for input(s): PROBNP in the last 8760 hours. HbA1C: No results for input(s): HGBA1C in the last 72 hours. CBG: No results for input(s): GLUCAP in the last 168 hours. Lipid Profile: No results for input(s): CHOL, HDL, LDLCALC, TRIG, CHOLHDL, LDLDIRECT in the last 72 hours. Thyroid Function Tests: No results for input(s): TSH, T4TOTAL, FREET4, T3FREE, THYROIDAB in the last 72 hours. Anemia Panel: No results for input(s): VITAMINB12, FOLATE, FERRITIN, TIBC, IRON,  RETICCTPCT in the last 72 hours. Sepsis Labs: Recent Labs  Lab 02/16/24 1727 02/16/24 1936  LATICACIDVEN 0.6 0.4*    Recent Results (from the past 240 hours)  Culture, blood (routine x 2)     Status: None (Preliminary result)   Collection Time: 02/16/24  5:58 PM   Specimen: BLOOD  Result Value Ref Range Status   Specimen Description   Final    BLOOD LEFT ANTECUBITAL Performed at [facility], [address], [place], [place] [address]    Special Requests   Final    BOTTLES DRAWN AEROBIC AND ANAEROBIC  Blood Culture adequate volume Performed at [facility], [address] [place], [place] [address]    Culture   Final    NO GROWTH < 12 HOURS Performed at [facility], [address], [place], [place] [address]    Report Status PENDING  Incomplete  SARS Coronavirus 2 by RT PCR (hospital order, performed in [facility] hospital lab) *cepheid single result test* Anterior Nasal Swab     Status: Abnormal   Collection Time: 02/16/24  7:25 PM   Specimen: Anterior Nasal Swab  Result Value Ref Range Status   SARS Coronavirus 2 by RT PCR POSITIVE (A) NEGATIVE Final    Comment: (NOTE) SARS-CoV-2 target nucleic acids are DETECTED  SARS-CoV-2 RNA is generally detectable in upper respiratory specimens  during the acute phase of infection.  Positive results are indicative  of the presence of the identified virus, but do not rule out bacterial infection or co-infection with other pathogens not detected by the test.  Clinical correlation with patient history and  other diagnostic information is necessary to determine patient infection status.  The expected result is negative.  Fact Sheet for Patients:   RoadLapTop.co.za   Fact Sheet for Healthcare Providers:   http://kim-miller.com/    This test is not yet approved or cleared by the [place]  FDA and  has been authorized for detection and/or diagnosis of SARS-CoV-2 by FDA under an Emergency Use Authorization (EUA).  This EUA will remain in effect (meaning this test can be used) for the duration of  the COVID-19 declaration under Section 564(b)(1)  of the Act, 21 U.S.C. section 360-bbb-3(b)(1), unless the authorization is terminated or revoked sooner.   Performed at [facility], [address], [place], [place] [address]   Culture, blood (routine x 2)     Status: None (Preliminary result)   Collection Time: 02/17/24  1:26 AM   Specimen: BLOOD  RIGHT HAND  Result Value Ref Range Status   Specimen Description   Final    BLOOD RIGHT HAND BLOOD Performed at [facility], [address], [place], [place] [address]    Special Requests   Final    Blood Culture results may not be optimal due to an inadequate volume of blood received in culture bottles BOTTLES DRAWN AEROBIC ONLY Performed at [facility], [address], [place], [place] [address]    Culture   Final    NO GROWTH < 12 HOURS Performed at [facility], [address] [place], [place] [address]    Report Status PENDING  Incomplete         Radiology Studies: CT ABDOMEN PELVIS W CONTRAST Result Date: 02/16/2024 CLINICAL DATA:  abdominal wall abscess EXAM: CT ABDOMEN AND PELVIS WITH CONTRAST TECHNIQUE: Multidetector CT imaging of the abdomen and pelvis was performed using the standard protocol following  bolus administration of intravenous contrast. RADIATION DOSE REDUCTION: This exam was performed according to the departmental dose-optimization program which includes automated exposure control, adjustment of the mA and/or kV according to patient size and/or use of iterative reconstruction technique. CONTRAST:  OMNIPAQUE  IOHEXOL  300 MG/ML  SOLN COMPARISON:  CT abdomen pelvis 10/19/2023 FINDINGS: Lower chest: No acute abnormality. Hepatobiliary: No focal liver abnormality. Status post cholecystectomy. No biliary dilatation. Pancreas: No focal lesion. Normal pancreatic contour. No surrounding inflammatory changes. No main pancreatic ductal dilatation. Spleen: Normal in size without focal abnormality. Adrenals/Urinary Tract: No adrenal nodule bilaterally. Bilateral kidneys enhance symmetrically. No hydronephrosis. No hydroureter. The urinary bladder is unremarkable. Stomach/Bowel: Stomach is within normal limits. No evidence of bowel wall thickening or dilatation. Appendix appears normal. Vascular/Lymphatic: No abdominal aorta or iliac  aneurysm. Mild atherosclerotic plaque. No abdominal, pelvic, or inguinal lymphadenopathy. Reproductive: Status post hysterectomy. No adnexal masses. Other: No intraperitoneal free fluid. No intraperitoneal free gas. No organized fluid collection. Musculoskeletal: Right anterior abdominal wall edema (2:40). No soft tissue abdominal wall organized fluid collection. No suspicious lytic or blastic osseous lesions. No acute displaced fracture. IMPRESSION: 1. Right anterior abdominal wall edema.  No abscess. 2. No acute intra-abdominal or intrapelvic abnormality. Electronically Signed   By: Morgane  Naveau M.D.   On: 02/16/2024 23:05   DG Chest Portable 1 View Result Date: 02/16/2024 CLINICAL DATA:  Cough EXAM: PORTABLE CHEST 1 VIEW COMPARISON:  CT chest abdomen and pelvis 01/26/2024 FINDINGS: Normal heart size and pulmonary vascularity. Left paratracheal soft tissue fullness corresponding to masslike consolidation on prior CT, likely post treatment changes. Right lung is clear. No pleural effusion or pneumothorax. IMPRESSION: Prominent left paratracheal soft tissue is unchanged since prior CT, likely post treatment changes. No developing airspace disease or consolidation in the lungs. Electronically Signed   By: Elsie Gravely M.D.   On: 02/16/2024 18:18        Scheduled Meds:  citalopram   10 mg Oral QHS   dextromethorphan -guaiFENesin   1 tablet Oral BID   diphenhydrAMINE   25 mg Oral QHS   enoxaparin  (LOVENOX ) injection  40 mg Subcutaneous Q24H   fesoterodine   4 mg Oral QPM   hydrOXYzine   25 mg Oral QHS   linezolid   600 mg Oral Q12H   montelukast   10 mg Oral QHS   mupirocin  ointment  1 Application Topical TID   zolpidem   5 mg Oral QHS,MR X 1   Continuous Infusions:     LOS: 1 day    Time spent: 50 minutes spent on 02/18/2024 caring for this patient face-to-face including chart review, ordering labs/tests, documenting, discussion with nursing staff, consultants, updating family and  interview/physical exam    Camellia PARAS [place], DO [facility] Available via Epic secure chat 7am-7pm After these hours, please refer to coverage provider listed on [email or web address] 02/18/2024, 11:59 AM

## 2024-02-19 ENCOUNTER — Telehealth: Payer: Self-pay

## 2024-02-19 ENCOUNTER — Other Ambulatory Visit (HOSPITAL_COMMUNITY): Payer: Self-pay

## 2024-02-19 DIAGNOSIS — L03311 Cellulitis of abdominal wall: Secondary | ICD-10-CM | POA: Diagnosis not present

## 2024-02-19 MED ORDER — SACCHAROMYCES BOULARDII 250 MG PO CAPS
250.0000 mg | ORAL_CAPSULE | Freq: Two times a day (BID) | ORAL | Status: DC
  Administered 2024-02-19: 250 mg via ORAL
  Filled 2024-02-19: qty 1

## 2024-02-19 MED ORDER — HYDROCODONE BIT-HOMATROP MBR 5-1.5 MG/5ML PO SOLN
5.0000 mL | Freq: Four times a day (QID) | ORAL | 0 refills | Status: AC | PRN
  Filled 2024-02-19: qty 120, 6d supply, fill #0

## 2024-02-19 MED ORDER — BENZONATATE 200 MG PO CAPS
200.0000 mg | ORAL_CAPSULE | Freq: Three times a day (TID) | ORAL | 0 refills | Status: AC | PRN
  Filled 2024-02-19: qty 21, 7d supply, fill #0

## 2024-02-19 MED ORDER — LINEZOLID 600 MG PO TABS
600.0000 mg | ORAL_TABLET | Freq: Two times a day (BID) | ORAL | 0 refills | Status: AC
  Filled 2024-02-19: qty 20, 10d supply, fill #0

## 2024-02-19 NOTE — Telephone Encounter (Signed)
 Pt called stating she's being d/c from the hospital today on Abx and want to know when should she restart the Tagrisso .  Notified Dr. Lanny or the pt's status and question.

## 2024-02-19 NOTE — TOC Transition Note (Signed)
 Transition of Care [facility]) - Discharge Note   Patient Details  Name: Abigail Golden MRN: [id number] Date of Birth: 1965-11-19  Transition of [facility] CM/SW Contact:  Doneta Glenys DASEN, RN Phone Number: 02/19/2024, 10:15 AM   Clinical Narrative:    Per MD patient stable for discharge. Place consult if needs present.   Final next level of care: Home/Self Care Barriers to Discharge: No Barriers Identified   Patient Goals and CMS Choice Patient states their goals for this hospitalization and ongoing recovery are:: Home alone CMS Medicare.gov Compare Post Acute Care list provided to::  (NA) Choice offered to / list presented to : NA [place] ownership interest in [facility].provided to:: Parent NA    Discharge Placement                       Discharge Plan and Services Additional resources added to the After Visit Summary for   In-house Referral: NA Discharge Planning Services: CM Consult Post Acute Care Choice: NA          DME Arranged: N/A DME Agency: NA       HH Arranged: NA HH Agency: NA        Social Drivers of Health (SDOH) Interventions SDOH Screenings   Food Insecurity: No Food Insecurity (02/17/2024)  Housing: Low Risk  (02/17/2024)  Transportation Needs: No Transportation Needs (02/17/2024)  Utilities: Not At Risk (02/17/2024)  Depression (PHQ2-9): Low Risk  (02/04/2024)  Financial Resource Strain: Medium Risk (11/12/2021)  Tobacco Use: Low Risk  (02/16/2024)     Readmission Risk Interventions    10/20/2023   10:22 AM  Readmission Risk Prevention Plan  Transportation Screening Complete  PCP or Specialist Appt within 5-7 Days Complete  Home Care Screening Complete  Medication Review (RN CM) Complete

## 2024-02-19 NOTE — Progress Notes (Signed)
 Discharge mediations delivered to patient at bedside. Discharge instructions given to patient no questions at this time.

## 2024-02-19 NOTE — Plan of Care (Signed)

## 2024-02-19 NOTE — Discharge Summary (Signed)
 Physician Discharge Summary  Abigail Golden FMW:[id number] DOB: [birth year] DOA: 02/16/2024  PCP: [name], PA-C  Admit date: 02/16/2024 Discharge date: 02/19/2024  Admitted From: Home Disposition: Home  Recommendations for Outpatient Follow-up:  Follow up with PCP in 1-2 weeks Continue linezolid  to complete antibiotic course for abdominal wall cellulitis  Home Health: No Equipment/Devices: None  Discharge Condition: Stable CODE STATUS: Full code Diet recommendation: Heart healthy diet  History of present illness:  Abigail Golden is a 58 y.o. 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 Patient reports she developed a boil abdomen over a week ago. She was evaluated by her PCP and due to concern for surrounding cellulitis, wound culture was collected and patient was started on doxycycline. States the boil ruptured with drainage of pus and blood out on Sunday.  She reports intermittent watery stools for months however yesterday, she started having fevers, nausea, body aches, mild headache and a dry cough.  She received a call from her PCP to present to the ED due to wound culture being positive for MRSA. She denies any shortness of breath, chest pain, dizziness, dysuria, abdominal pain, or vomiting   In the ED, afebrile, normotensive with SpO2 97% on room air. Initial labs significant for creatinine 1.22, alk phos 151, WBC 4.3, Hgb 11.5, lactic acid 0.6-0.4, positive COVID-19 test. CXR shows no active disease. CT A/P showed right anterior abdominal wall edema but no abscess or intra-abdominal pathology. Pt received IV vancomycin  and IV Zofran . TRH was consulted for admission.   Hospital course:  Abdominal wall  cellulitis Immunocompromise patient with abdominal boil that has progressed with surrounding cellulitis found to have MRSA from wound culture at PCP office. She is afebrile and without leukocytosis, CT A/P with no evidence of intra-abdominal abscess.  Blood cultures remain negative during hospitalization.  Initially started on IV vancomycin  and transition to linezolid  600 mg p.o. every 12 hours; will continue on discharge to complete antibiotic course.   Vancomycin  extravasation LUE Patient experienced vancomycin  extravasation to her left upper extremity IV site on 02/17/2024.  Infusion was discontinued immediately and heats pack and elevation of extremity was performed.  Aspiration by RN around the site of extravasation was performed and patient was administered 5 injections of hyaluronidase  surrounding the primary of the site which she tolerated well. Continue to monitor left upper extremity extravasation site on follow-up with PCP.   COVID-19 infection Presented with 2 days of fever, dry cough and bodyaches. COVID-19 PCR positive on admission.  Stable from respiratory standpoint, no O2 requirements and no infiltrate or consolidation on chest x-ray.  Continue supportive care with Tessalon  Perles/Hycodan cough syrup as needed.   Stage IV non-small cell lung cancer Brain metastases Patient with primary adenocarcinoma of the LUL diagnosed in 04/2021, complicated by brain mets status post radiation therapy, currently on chemotherapy. Restaging CT earlier this month showed stable disease with no progression. Continue to hold Tagrisso  until follow-up with oncology in the outpatient   Asthma, chronic without acute exacerbation Continue Singulair    Anxiety and depression Continue Celexa  and as needed hydroxyzine    Insomnia Continue Ambien  as needed for sleep   Morbid obesity, class III Body mass index is 46.9 kg/m.  Discharge Diagnoses:  Principal Problem:   Abdominal wall cellulitis Active  Problems:   Morbid obesity with BMI of 45.0-49.9, adult (HCC)   COVID-19   Lung cancer metastatic to brain [facility] [place])    Discharge Instructions  Discharge Instructions     Call MD for:  difficulty breathing, headache or visual disturbances   Complete by: As directed    Call MD for:  extreme fatigue   Complete by: As directed    Call MD for:  persistant dizziness or light-headedness   Complete by: As directed    Call MD for:  persistant nausea and vomiting   Complete by: As directed    Call MD for:  severe uncontrolled pain   Complete by: As directed    Call MD for:  temperature >100.4   Complete by: As directed    Diet - low sodium heart healthy   Complete by: As directed    Increase activity slowly   Complete by: As directed       Allergies as of 02/19/2024       Reactions   Celebrex [celecoxib] Itching, Rash   Facial itching   Minocycline Itching, Rash   Facial itching        Medication List     PAUSE taking these medications    Tagrisso  80 MG tablet Wait to take this until your doctor or other care provider tells you to start again. Generic drug: osimertinib  mesylate Take 1 tablet (80 mg total) by mouth daily.       STOP taking these medications    doxycycline 100 MG capsule Commonly known as: VIBRAMYCIN       TAKE these medications    acetaminophen  500 MG tablet Commonly known as: TYLENOL  Take 500 mg by mouth every 6 (six) hours as needed for moderate pain or headache.   albuterol  108 (90 Base) MCG/ACT inhaler Commonly known as: VENTOLIN  HFA Inhale 2 puffs into the lungs every 4 (four) hours as needed.   ALPRAZolam  0.25 MG tablet Commonly known as: XANAX  Take 0.25 mg by mouth every 4 (four) hours as needed for anxiety. for anxiety   benzonatate  200 MG capsule Commonly known as: TESSALON  Take 1 capsule (200 mg total) by mouth 3 (three) times daily as needed for up to 7 days for cough.   citalopram  10 MG tablet Commonly known as:  CELEXA  Take 10 mg by mouth at bedtime.   Diclofenac Potassium(Migraine) 50 MG Pack Take 1 each by mouth as needed. As needed for migraines   diclofenac sodium 1 % Gel Commonly known as: VOLTAREN Apply topically as needed. To affected area   diphenhydrAMINE  25 MG tablet Commonly known as: BENADRYL  Take 25 mg by mouth See admin instructions. 25 mg at bedtime 25 mg as needed for itching   eletriptan 20 MG tablet Commonly known as: RELPAX Take 20 mg by mouth as needed for headache (at onset of migraine.). May repeat in 2 hours if needed.   fluticasone 50 MCG/ACT nasal spray Commonly known as: FLONASE Place 2 sprays into both nostrils daily as needed for allergies.   HYDROcodone  bit-homatropine 5-1.5 MG/5ML syrup Commonly known as: HYCODAN Take 5 mLs by mouth every 6 (six) hours as needed for cough (Cough not relieved with Tessalon  Perles).   hydrOXYzine  25 MG tablet Commonly known as: ATARAX  Take 25 mg by mouth See admin instructions. 25 mg at bedtime 25 mg during the day as needed for itching/anxiety   linezolid  600 MG tablet Commonly known as: ZYVOX  Take 1 tablet (600 mg total) by mouth every 12 (twelve)  hours for 10 days.   loratadine  10 MG tablet Commonly known as: CLARITIN  Take 10 mg by mouth daily.   montelukast  10 MG tablet Commonly known as: SINGULAIR  Take 10 mg by mouth at bedtime.   mupirocin  ointment 2 % Commonly known as: BACTROBAN  Apply 1 Application topically 3 (three) times daily.   nystatin  powder Commonly known as: nystatin  APPLY  POWDER TOPICALLY TO AFFECTED AREA THREE TIMES DAILY   ondansetron  4 MG disintegrating tablet Commonly known as: ZOFRAN -ODT DISSOLVE 1 TABLET IN MOUTH EVERY 8 HOURS AS NEEDED FOR NAUSEA AND VOMITING   prochlorperazine  10 MG tablet Commonly known as: COMPAZINE  TAKE 1 TABLET BY MOUTH EVERY 6 HOURS AS NEEDED FOR NAUSEA FOR VOMITING   solifenacin 10 MG tablet Commonly known as: VESICARE Take 10 mg by mouth every  evening.   STOOL SOFTENER LAXATIVE PO Take 1 tablet by mouth as needed.   traMADol  50 MG tablet Commonly known as: ULTRAM  Take 1 tablet (50 mg total) by mouth every 6 (six) hours as needed.   triamcinolone cream 0.1 % Commonly known as: KENALOG Apply 1 Application topically 2 (two) times daily.   zolpidem  10 MG tablet Commonly known as: AMBIEN  TAKE 1/2 TO 1 (ONE-HALF TO ONE) TABLET BY MOUTH AT BEDTIME AS NEEDED FOR SLEEP What changed:  how much to take how to take this when to take this        Follow-up Information     [name], PA-C. Schedule an appointment as soon as possible for a visit in 1 week(s).   Specialty: Family Medicine Contact information: [address] [place] [address] [phone]                Allergies  Allergen Reactions   Celebrex [Celecoxib] Itching and Rash    Facial itching   Minocycline Itching and Rash    Facial itching     Consultations: None   Procedures/Studies: CT ABDOMEN PELVIS W CONTRAST Result Date: 02/16/2024 CLINICAL DATA:  abdominal wall abscess EXAM: CT ABDOMEN AND PELVIS WITH CONTRAST TECHNIQUE: Multidetector CT imaging of the abdomen and pelvis was performed using the standard protocol following bolus administration of intravenous contrast. RADIATION DOSE REDUCTION: This exam was performed according to the departmental dose-optimization program which includes automated exposure control, adjustment of the mA and/or kV according to patient size and/or use of iterative reconstruction technique. CONTRAST:  OMNIPAQUE  IOHEXOL  300 MG/ML  SOLN COMPARISON:  CT abdomen pelvis 10/19/2023 FINDINGS: Lower chest: No acute abnormality. Hepatobiliary: No focal liver abnormality. Status post cholecystectomy. No biliary dilatation. Pancreas: No focal lesion. Normal pancreatic contour. No surrounding inflammatory changes. No main pancreatic ductal dilatation. Spleen: Normal in size without focal abnormality.  Adrenals/Urinary Tract: No adrenal nodule bilaterally. Bilateral kidneys enhance symmetrically. No hydronephrosis. No hydroureter. The urinary bladder is unremarkable. Stomach/Bowel: Stomach is within normal limits. No evidence of bowel wall thickening or dilatation. Appendix appears normal. Vascular/Lymphatic: No abdominal aorta or iliac aneurysm. Mild atherosclerotic plaque. No abdominal, pelvic, or inguinal lymphadenopathy. Reproductive: Status post hysterectomy. No adnexal masses. Other: No intraperitoneal free fluid. No intraperitoneal free gas. No organized fluid collection. Musculoskeletal: Right anterior abdominal wall edema (2:40). No soft tissue abdominal wall organized fluid collection. No suspicious lytic or blastic osseous lesions. No acute displaced fracture. IMPRESSION: 1. Right anterior abdominal wall edema.  No abscess. 2. No acute intra-abdominal or intrapelvic abnormality. Electronically Signed   By: Morgane  Naveau M.D.   On: 02/16/2024 23:05   DG Chest Portable 1 View  Result Date: 02/16/2024 CLINICAL DATA:  Cough EXAM: PORTABLE CHEST 1 VIEW COMPARISON:  CT chest abdomen and pelvis 01/26/2024 FINDINGS: Normal heart size and pulmonary vascularity. Left paratracheal soft tissue fullness corresponding to masslike consolidation on prior CT, likely post treatment changes. Right lung is clear. No pleural effusion or pneumothorax. IMPRESSION: Prominent left paratracheal soft tissue is unchanged since prior CT, likely post treatment changes. No developing airspace disease or consolidation in the lungs. Electronically Signed   By: Elsie Gravely M.D.   On: 02/16/2024 18:18   CT CHEST ABDOMEN PELVIS W CONTRAST Result Date: 02/04/2024 CLINICAL DATA:  Non-small cell lung cancer (NSCLC), metastatic, assess treatment response. * Tracking Code: BO * EXAM: CT CHEST, ABDOMEN, AND PELVIS WITH CONTRAST TECHNIQUE: Multidetector CT imaging of the chest, abdomen and pelvis was performed following the standard  protocol during bolus administration of intravenous contrast. RADIATION DOSE REDUCTION: This exam was performed according to the departmental dose-optimization program which includes automated exposure control, adjustment of the mA and/or kV according to patient size and/or use of iterative reconstruction technique. CONTRAST:  OMNIPAQUE  IOHEXOL  300 MG/ML  SOLN COMPARISON:  CT scan abdomen and pelvis from 10/19/2023 and CT scan chest from 06/22/2023. FINDINGS: CT CHEST FINDINGS Cardiovascular: Normal cardiac size. No pericardial effusion. No aortic aneurysm. Mediastinum/Nodes: Visualized thyroid gland appears grossly unremarkable. No solid / cystic mediastinal masses. The esophagus is nondistended precluding optimal assessment. No axillary, mediastinal or hilar lymphadenopathy by size criteria. Lungs/Pleura: The central tracheo-bronchial tree is patent. Redemonstration of peripheral wedge-shaped opacity in the apicoposterior segment of left upper lobe with associated air bronchogram, essentially similar to the prior study and favored to represent posttreatment changes. There are patchy areas of linear, plate-like atelectasis and/or scarring throughout bilateral lungs. No mass or consolidation. No pleural effusion or pneumothorax. There is a stable 2 mm calcified granuloma in the left lower lobe. No new or suspicious lung nodules. Musculoskeletal: The visualized soft tissues of the chest wall are grossly unremarkable. No suspicious osseous lesions. There are mild multilevel degenerative changes in the visualized spine. CT ABDOMEN PELVIS FINDINGS Hepatobiliary: The liver is normal in size. Non-cirrhotic configuration. No suspicious mass. No intrahepatic or extrahepatic bile duct dilation. Gallbladder is surgically absent. Pancreas: Unremarkable. No pancreatic ductal dilatation or surrounding inflammatory changes. Spleen: Within normal limits. No focal lesion. Adrenals/Urinary Tract: Adrenal glands are  unremarkable. No suspicious renal mass. There is a 2 mm nonobstructing calculus in the left kidney interpolar region. No other nephroureterolithiasis or obstructive uropathy on either side. Pick 2 Stomach/Bowel: No disproportionate dilation of the small or large bowel loops. No evidence of abnormal bowel wall thickening or inflammatory changes. The appendix is unremarkable. There are scattered diverticula mainly in the sigmoid colon, without imaging signs of diverticulitis. Vascular/Lymphatic: No ascites or pneumoperitoneum. No abdominal or pelvic lymphadenopathy, by size criteria. No aneurysmal dilation of the major abdominal arteries. Reproductive: The uterus is surgically absent. No large adnexal mass. Other: There is a tiny fat containing umbilical hernia. The soft tissues and abdominal wall are otherwise unremarkable. Musculoskeletal: No suspicious osseous lesions. There are mild multilevel degenerative changes in the visualized spine. IMPRESSION: 1. Redemonstration of peripheral wedge-shaped opacity in the apicoposterior segment of left upper lobe with associated air bronchogram, essentially similar to the prior study and favored to represent posttreatment changes. No new or suspicious lung nodule. 2. No metastatic disease identified within the chest, abdomen or pelvis. 3. Multiple other nonacute observations, as described above. Electronically Signed   By: Ree Kimberlee HERO.D.  On: 02/04/2024 08:24     Subjective: Patient seen examined bedside, lying in bed.  No complaint other than persistent nonproductive cough.  Ready for discharge home.  No other specific complaints, concerns or questions at this time.  Denies headache, no dizziness, no chest pain, no palpitations, no shortness of breath, no abdominal pain, no fever/chills/night sweats, no nausea/vomiting/diarrhea, no focal weakness, no fatigue, no paresthesia.  No acute events overnight per nurse.  Discharge Exam: Vitals:   02/18/24 2005  02/19/24 0414  BP: (!) 117/51 137/80  Pulse: 71 72  Resp: 14 18  Temp: 98 F (36.7 C) 98.2 F (36.8 C)  SpO2: 97% 97%   Vitals:   02/18/24 0621 02/18/24 1202 02/18/24 2005 02/19/24 0414  BP: (!) 106/58 108/62 (!) 117/51 137/80  Pulse: 62 72 71 72  Resp: 16 18 14 18   Temp: 97.8 F (36.6 C) 98 F (36.7 C) 98 F (36.7 C) 98.2 F (36.8 C)  TempSrc:  Oral  Oral  SpO2: 97% 98% 97% 97%  Weight:      Height:        Physical Exam: GEN: NAD, alert and oriented x 3, obese HEENT: NCAT, PERRL, EOMI, sclera clear, MMM PULM: CTAB w/o wheezes/crackles, normal respiratory effort, on room air with SpO2 97% at rest CV: RRR w/o M/G/R GI: abd soft, NTND, + BS MSK: no peripheral edema, muscle strength globally intact 5/5 bilateral upper/lower extremities NEURO: CN II-XII intact, no focal deficits, sensation to light touch intact PSYCH: normal mood/affect Integumentary: Small erythematous lesion noted to abdomen right upper abdomen improving and more maroon-colored, no fluctuance or purulent drainage, noted left upper extremity antecubital area extravasation site with ecchymosis without concerns for necrosis, erythema, fluctuance or purulence; otherwise no other concerning rashes/lesions/wounds noted on exposed skin surfaces     The results of significant diagnostics from this hospitalization (including imaging, microbiology, ancillary and laboratory) are listed below for reference.     Microbiology: Recent Results (from the past 240 hours)  Culture, blood (routine x 2)     Status: None (Preliminary result)   Collection Time: 02/16/24  5:58 PM   Specimen: BLOOD  Result Value Ref Range Status   Specimen Description   Final    BLOOD LEFT ANTECUBITAL Performed at [facility], [address] [place], [place] [address]    Special Requests   Final    BOTTLES DRAWN AEROBIC AND ANAEROBIC Blood Culture adequate volume Performed at [facility], [address] [place], [place] [address]    Culture   Final    NO GROWTH 3 DAYS Performed at [facility], [address], [place], [place] [address]    Report Status PENDING  Incomplete  SARS Coronavirus 2 by RT PCR (hospital order, performed in [facility] hospital lab) *cepheid single result test* Anterior Nasal Swab     Status: Abnormal   Collection Time: 02/16/24  7:25 PM   Specimen: Anterior Nasal Swab  Result Value Ref Range Status   SARS Coronavirus 2 by RT PCR POSITIVE (A) NEGATIVE Final    Comment: (NOTE) SARS-CoV-2 target nucleic acids are DETECTED  SARS-CoV-2 RNA is generally detectable in upper respiratory specimens  during the acute phase of infection.  Positive results are indicative  of the presence of the identified virus, but do not rule out bacterial infection or co-infection with other pathogens not detected by the test.  Clinical correlation with patient history and  other diagnostic information is necessary  to determine patient infection status.  The expected result is negative.  Fact Sheet for Patients:   RoadLapTop.co.za   Fact Sheet for Healthcare Providers:   http://kim-miller.com/    This test is not yet approved or cleared by the [place]  FDA and  has been authorized for detection and/or diagnosis of SARS-CoV-2 by FDA under an Emergency Use Authorization (EUA).  This EUA will remain in effect (meaning this test can be used) for the duration of  the COVID-19 declaration under Section 564(b)(1)  of the Act, 21 U.S.C. section 360-bbb-3(b)(1), unless the authorization is terminated or revoked sooner.   Performed at [facility], [address], [place], [place] [address]   Culture, blood (routine x 2)     Status: None (Preliminary result)   Collection Time: 02/17/24  1:26 AM   Specimen: BLOOD RIGHT HAND  Result Value Ref Range Status   Specimen Description   Final    BLOOD RIGHT  HAND BLOOD Performed at [facility], [address], [place], [place] [address]    Special Requests   Final    Blood Culture results may not be optimal due to an inadequate volume of blood received in culture bottles BOTTLES DRAWN AEROBIC ONLY Performed at [facility], [address], [place], [place] [address]    Culture   Final    NO GROWTH 2 DAYS Performed at [facility], [address] [place], [place] [address]    Report Status PENDING  Incomplete     Labs: BNP (last 3 results) No results for input(s): BNP in the last 8760 hours. Basic Metabolic Panel: Recent Labs  Lab 02/16/24 1738 02/17/24 0451  NA 136 135  K 4.4 4.3  CL 101 104  CO2 21* 17*  GLUCOSE 103* 104*  BUN 18 17  CREATININE 1.22* 1.15*  CALCIUM 9.9 9.0   Liver Function Tests: Recent Labs  Lab 02/16/24 1738  AST 31  ALT 63*  ALKPHOS 151*  BILITOT 0.4  PROT 7.5  ALBUMIN 4.1   No results for input(s): LIPASE, AMYLASE in the last 168 hours. No results for input(s): AMMONIA in the last 168 hours. CBC: Recent Labs  Lab 02/16/24 1738 02/17/24 0451  WBC 4.3 4.7  NEUTROABS 3.0  --   HGB 11.5* 10.4*  HCT 37.0 32.4*  MCV 87.7 88.8  PLT 224 186   Cardiac Enzymes: No results for input(s): CKTOTAL, CKMB, CKMBINDEX, TROPONINI in the last 168 hours. BNP: Invalid input(s): POCBNP CBG: No results for input(s): GLUCAP in the last 168 hours. D-Dimer No results for input(s): DDIMER in the last 72 hours. Hgb A1c No results for input(s): HGBA1C in the last 72 hours. Lipid Profile No results for input(s): CHOL, HDL, LDLCALC, TRIG, CHOLHDL, LDLDIRECT in the last 72 hours. Thyroid function studies No results for input(s): TSH, T4TOTAL, T3FREE, THYROIDAB in the last 72 hours.  Invalid input(s): FREET3 Anemia work up No results for input(s): VITAMINB12, FOLATE, FERRITIN, TIBC, IRON, RETICCTPCT in the  last 72 hours. Urinalysis    Component Value Date/Time   COLORURINE YELLOW 10/19/2023 1654   APPEARANCEUR CLEAR 10/19/2023 1654   LABSPEC 1.034 (H) 10/19/2023 1654   PHURINE 5.0 10/19/2023 1654   GLUCOSEU NEGATIVE 10/19/2023 1654   HGBUR NEGATIVE 10/19/2023 1654   BILIRUBINUR NEGATIVE 10/19/2023 1654   KETONESUR NEGATIVE 10/19/2023 1654   PROTEINUR NEGATIVE 10/19/2023 1654   NITRITE NEGATIVE 10/19/2023 1654   LEUKOCYTESUR NEGATIVE 10/19/2023 1654   Sepsis  Labs Recent Labs  Lab 02/16/24 1738 02/17/24 0451  WBC 4.3 4.7   Microbiology Recent Results (from the past 240 hours)  Culture, blood (routine x 2)     Status: None (Preliminary result)   Collection Time: 02/16/24  5:58 PM   Specimen: BLOOD  Result Value Ref Range Status   Specimen Description   Final    BLOOD LEFT ANTECUBITAL Performed at [facility], [address], [place], [place] [address]    Special Requests   Final    BOTTLES DRAWN AEROBIC AND ANAEROBIC Blood Culture adequate volume Performed at [facility], [address] [place], [place] [address]    Culture   Final    NO GROWTH 3 DAYS Performed at [facility], [address], [place], [place] [address]    Report Status PENDING  Incomplete  SARS Coronavirus 2 by RT PCR (hospital order, performed in [facility] hospital lab) *cepheid single result test* Anterior Nasal Swab     Status: Abnormal   Collection Time: 02/16/24  7:25 PM   Specimen: Anterior Nasal Swab  Result Value Ref Range Status   SARS Coronavirus 2 by RT PCR POSITIVE (A) NEGATIVE Final    Comment: (NOTE) SARS-CoV-2 target nucleic acids are DETECTED  SARS-CoV-2 RNA is generally detectable in upper respiratory specimens  during the acute phase of infection.  Positive results are indicative  of the presence of the identified virus, but do not rule out bacterial infection or co-infection with other pathogens not detected by the test.  Clinical  correlation with patient history and  other diagnostic information is necessary to determine patient infection status.  The expected result is negative.  Fact Sheet for Patients:   RoadLapTop.co.za   Fact Sheet for Healthcare Providers:   http://kim-miller.com/    This test is not yet approved or cleared by the [place]  FDA and  has been authorized for detection and/or diagnosis of SARS-CoV-2 by FDA under an Emergency Use Authorization (EUA).  This EUA will remain in effect (meaning this test can be used) for the duration of  the COVID-19 declaration under Section 564(b)(1)  of the Act, 21 U.S.C. section 360-bbb-3(b)(1), unless the authorization is terminated or revoked sooner.   Performed at [facility], [address], [place], [place] [address]   Culture, blood (routine x 2)     Status: None (Preliminary result)   Collection Time: 02/17/24  1:26 AM   Specimen: BLOOD RIGHT HAND  Result Value Ref Range Status   Specimen Description   Final    BLOOD RIGHT HAND BLOOD Performed at [facility], [address], [place], [place] [address]    Special Requests   Final    Blood Culture results may not be optimal due to an inadequate volume of blood received in culture bottles BOTTLES DRAWN AEROBIC ONLY Performed at [facility], [address] [place], [place] [address]    Culture   Final    NO GROWTH 2 DAYS Performed at [facility], [address] [place], [place] [address]    Report Status PENDING  Incomplete     Time coordinating discharge: Over 30 minutes  SIGNED:   Camellia PARAS [place], DO  [facility] 02/19/2024, 9:40 AM

## 2024-02-21 LAB — CULTURE, BLOOD (ROUTINE X 2)
Culture: NO GROWTH
Special Requests: ADEQUATE

## 2024-02-22 LAB — CULTURE, BLOOD (ROUTINE X 2): Culture: NO GROWTH

## 2024-02-23 ENCOUNTER — Inpatient Hospital Stay: Attending: Hematology | Admitting: Nurse Practitioner

## 2024-02-23 ENCOUNTER — Encounter: Payer: Self-pay | Admitting: Nurse Practitioner

## 2024-02-23 ENCOUNTER — Other Ambulatory Visit (HOSPITAL_COMMUNITY): Payer: Self-pay

## 2024-02-23 DIAGNOSIS — C3412 Malignant neoplasm of upper lobe, left bronchus or lung: Secondary | ICD-10-CM

## 2024-02-23 NOTE — Progress Notes (Signed)
 [facility]   [phone] [phone]    Patient Care Team: [name], PA-C as PCP - General (Family Medicine) [name], MD as Consulting Physician (Hematology and Oncology) [name], MD as Consulting Physician (Radiation Oncology)   I connected with Abigail Golden on 02/23/24 at  9:30 AM EDT by telephone visit and verified that I am speaking with the correct person using two identifiers.   I discussed the limitations, risks, security and privacy concerns of performing an evaluation and management service by telemedicine and the availability of in-person appointments. I also discussed with the patient that there may be a patient responsible charge related to this service. The patient expressed understanding and agreed to proceed.   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 Metastatic disease would be the most likely cause of this appearance. Other possibilities include venous infarctions and cerebritis. MRI with contrast recommended.    04/23/2021 Imaging   MR Brain W and Wo Contrast  IMPRESSION: 1. Metastatic disease to the brain. Two enhancing brain masses with moderate associated vasogenic edema: solid 16 mm left parietal lobe mass, and a larger 29 mm rim enhancing  cystic or necrotic mass in the left inferior frontal gyrus. Two other small 2-3 mm metastases in the right inferior frontal gyrus, right posterior temporal lobe. And questionable two additional punctate metastases in both superior frontal gyri abutting the falx (series 18, image 46). 2. No significant intracranial mass effect. No other intracranial abnormality.   04/23/2021 Imaging   CT Chest W Contrast  IMPRESSION: 4.5 cm spiculated left upper lobe mass most compatible with primary lung cancer. This abuts the medial pleural surface. A few small adjacent pre-vascular mediastinal lymph nodes, none pathologically enlarged. Recommend cardiothoracic surgical and oncologic consultation.   No acute findings or evidence of metastatic disease in the abdomen or pelvis.   04/24/2021 Cancer Staging   Staging form: Lung, AJCC 8th Edition - Clinical stage from 04/24/2021: Stage IVA (cT2, cN2, pM1b) - Signed by [name], MD on 05/14/2021   04/24/2021 Initial Biopsy   FINAL MICROSCOPIC DIAGNOSIS:   A. LUNG, LUL, FINE NEEDLE ASPIRATION:  - Malignant cells present, consistent with adenocarcinoma   B. LUNG, LUL, BRUSHING:  - Malignant cells consistent with adenocarcinoma, see comment    COMMENT:  B.  Immunohistochemical stains show that the tumor cells are positive for TTF-1 while they are negative for p63 and CK5/6, consistent with above interpretation. The number of tumor cells appears to be insufficient for molecular studies.   05/03/2021 Initial Diagnosis   Lung cancer (HCC)   05/08/2021 - 05/13/2021 Radiation Therapy   SRS treatment to the metastatic brain lesions under the care of Dr. Patrcia    08/05/2021 Imaging   EXAM: CT CHEST, ABDOMEN, AND PELVIS WITH  CONTRAST  IMPRESSION: 1. No substantial change in size of the left upper lobe pulmonary lesion. 2. Borderline to mildly enlarged adjacent prevascular/AP window lymph nodes have increased in size in the interval. This finding raises  concern for disease progression. 3. Interval development of upper normal bilateral common iliac and left external iliac lymph nodes. Close attention on follow-up recommended. 4. No other evidence for metastatic disease in the abdomen or pelvis.   09/22/2022 Imaging    IMPRESSION: 1. Area of chronic postradiation mass-like fibrosis in the posteromedial aspect of the left upper lung, without definitive findings of locally recurrent disease or definite metastatic disease in the chest, abdomen or pelvis. 2. Mild atherosclerosis. 3. Additional incidental findings, as above.     03/19/2023 Imaging   CT chest,abdomen, and pelvis with contrast IMPRESSION: 1. Unchanged appearance of posterior paramedian left upper lobe with dense post treatment fibrosis and volume loss. 2. No evidence of recurrent or metastatic disease in the chest, abdomen, or pelvis. 3. Mildly coarse contour of the liver, suggestive of cirrhosis. Correlate with biochemical findings. 4. Status post cholecystectomy and hysterectomy.  Aortic Atherosclerosis (ICD10-       CURRENT THERAPY: Tagrisso  for metastatic lung cancer, currently on hold  INTERVAL HISTORY [name] presents for phone follow-up as scheduled to see how she is doing since hospital discharge and discussed restarting Tagrisso .  Discharged 8/29, feeling better daily but still tired with low energy.  Attributes some of this to sleeping more during the day than at night.  Up and about when awake with light housework and intermittent rest periods.  Has some exertional dyspnea but otherwise breathing is okay.  Occasional cough, managed with medication mainly at night.  Not requiring oxygen.  Pulse ox during the call shows O2 94-95% and heart rate 75.  Abdominal wounds are closed now and healing, but still purpleish red and hard.  Tolerating antibiotics, bowel movements have improved.  She is adding nutrition daily.  Denies recurrent fever or chills.  ROS  All  other systems reviewed and negative  Past Medical History:  Diagnosis Date   Allergy    Anxiety    Chronic bronchitis (HCC)    Depression    Headache    Hypoglycemia    Lower back pain    since fall ~ 2012 (05/25/2015)   Migraine    05/25/2015 probably once/month   Migraine headache    Pain in left hip    since fall ~ 2012 (05/25/2015)   Pneumonia several times     Past Surgical History:  Procedure Laterality Date   ABDOMINAL HYSTERECTOMY  11/2002   found benign tumors   BRONCHIAL BIOPSY  04/24/2021   Procedure: BRONCHIAL BIOPSIES;  Surgeon: Brenna Adine CROME, DO;  Location: [facility];  Service: Pulmonary;;   BRONCHIAL BRUSHINGS  04/24/2021   Procedure: BRONCHIAL BRUSHINGS;  Surgeon: Brenna Adine CROME, DO;  Location: [facility];  Service: Pulmonary;;   BRONCHIAL NEEDLE ASPIRATION BIOPSY  04/24/2021   Procedure: BRONCHIAL NEEDLE ASPIRATION BIOPSIES;  Surgeon: Brenna Adine CROME, DO;  Location: [facility];  Service: Pulmonary;;   CHEST TUBE INSERTION  04/24/2021   Procedure: CHEST TUBE INSERTION;  Surgeon: Brenna Adine CROME, DO;  Location: [facility];  Service: Pulmonary;;   CHOLECYSTECTOMY N/A 05/25/2015   Procedure: LAPAROSCOPIC CHOLECYSTECTOMY;  Surgeon: Lynda Leos, MD;  Location: [facility] OR;  Service: General;  Laterality: N/A;   LAPAROSCOPIC CHOLECYSTECTOMY  05/25/2015   MENISCUS REPAIR Right 03/2017   VIDEO BRONCHOSCOPY WITH ENDOBRONCHIAL NAVIGATION N/A 04/24/2021  Procedure: VIDEO BRONCHOSCOPY WITH ENDOBRONCHIAL NAVIGATION;  Surgeon: Brenna Adine CROME, DO;  Location: [facility];  Service: Pulmonary;  Laterality: N/A;   VIDEO BRONCHOSCOPY WITH RADIAL ENDOBRONCHIAL ULTRASOUND  04/24/2021   Procedure: VIDEO BRONCHOSCOPY WITH RADIAL ENDOBRONCHIAL ULTRASOUND;  Surgeon: Brenna Adine CROME, DO;  Location: [facility];  Service: Pulmonary;;     Outpatient Encounter Medications as of 02/23/2024  Medication Sig Note   acetaminophen  (TYLENOL ) 500 MG tablet Take 500 mg by mouth  every 6 (six) hours as needed for moderate pain or headache.    albuterol  (VENTOLIN  HFA) 108 (90 Base) MCG/ACT inhaler Inhale 2 puffs into the lungs every 4 (four) hours as needed. 10/19/2023: PRN, no recent need/use   ALPRAZolam  (XANAX ) 0.25 MG tablet Take 0.25 mg by mouth every 4 (four) hours as needed for anxiety. for anxiety    benzonatate  (TESSALON ) 200 MG capsule Take 1 capsule (200 mg total) by mouth 3 (three) times daily as needed for up to 7 days for cough.    citalopram  (CELEXA ) 10 MG tablet Take 10 mg by mouth at bedtime.    Diclofenac Potassium,Migraine, 50 MG PACK Take 1 each by mouth as needed. As needed for migraines    diclofenac sodium (VOLTAREN) 1 % GEL Apply topically as needed. To affected area    diphenhydrAMINE  (BENADRYL ) 25 MG tablet Take 25 mg by mouth See admin instructions. 25 mg at bedtime 25 mg as needed for itching    Docusate Sodium  (STOOL SOFTENER LAXATIVE PO) Take 1 tablet by mouth as needed.    eletriptan (RELPAX) 20 MG tablet Take 20 mg by mouth as needed for headache (at onset of migraine.). May repeat in 2 hours if needed.    fluticasone (FLONASE) 50 MCG/ACT nasal spray Place 2 sprays into both nostrils daily as needed for allergies.    HYDROcodone  bit-homatropine (HYCODAN) 5-1.5 MG/5ML syrup Take 5 mLs by mouth every 6 (six) hours as needed for cough (Cough not relieved with Tessalon  Perles).    hydrOXYzine  (ATARAX /VISTARIL ) 25 MG tablet Take 25 mg by mouth See admin instructions. 25 mg at bedtime 25 mg during the day as needed for itching/anxiety    linezolid  (ZYVOX ) 600 MG tablet Take 1 tablet (600 mg total) by mouth every 12 (twelve) hours for 10 days.    loratadine  (CLARITIN ) 10 MG tablet Take 10 mg by mouth daily.    montelukast  (SINGULAIR ) 10 MG tablet Take 10 mg by mouth at bedtime.    mupirocin  ointment (BACTROBAN ) 2 % Apply 1 Application topically 3 (three) times daily.    nystatin  powder APPLY  POWDER TOPICALLY TO AFFECTED AREA THREE TIMES DAILY     ondansetron  (ZOFRAN -ODT) 4 MG disintegrating tablet DISSOLVE 1 TABLET IN MOUTH EVERY 8 HOURS AS NEEDED FOR NAUSEA AND VOMITING    [Paused] osimertinib  mesylate (TAGRISSO ) 80 MG tablet Take 1 tablet (80 mg total) by mouth daily. 02/17/2024: On hold until patient recovers   prochlorperazine  (COMPAZINE ) 10 MG tablet TAKE 1 TABLET BY MOUTH EVERY 6 HOURS AS NEEDED FOR NAUSEA FOR VOMITING    solifenacin (VESICARE) 10 MG tablet Take 10 mg by mouth every evening.    traMADol  (ULTRAM ) 50 MG tablet Take 1 tablet (50 mg total) by mouth every 6 (six) hours as needed.    triamcinolone cream (KENALOG) 0.1 % Apply 1 Application topically 2 (two) times daily.    zolpidem  (AMBIEN ) 10 MG tablet TAKE 1/2 TO 1 (ONE-HALF TO ONE) TABLET BY MOUTH AT BEDTIME AS NEEDED FOR SLEEP (Patient taking  differently: Take 5-10 mg by mouth at bedtime. TAKE 1/2 TO 1 (ONE-HALF TO ONE) TABLET BY MOUTH AT BEDTIME AS NEEDED FOR SLEEP)    No facility-administered encounter medications on file as of 02/23/2024.     There were no vitals filed for this visit. There is no height or weight on file to calculate BMI.   HR 75, O2 94-95% on home pulse ox   ECOG PERFORMANCE STATUS: 2 - Symptomatic, <50% confined to bed  PHYSICAL EXAM Voice sounds congested by phone, speech is clear.  No cough or conversational dyspnea    LAB DATA  No labs for this encounter     ASSESSMENT & PLAN:  Abdominal wall MRSA+ cellulitis and COVID-19+ -Hospitalized 8/26/- 02/19/24 -Doxycycline per PCP, cultured MRSA+ changed to IV vanc and d/c'd home on 10 day course linezolid  (to complete 9/6 or 9/7. Per pt infection is improving, boils healing -CXR negative; respiratory status appears stable, no O2 requirement. 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 She tolerates moderately well with mucositis and skin toxicities.   -she had thoracic radiation (The primary tumor and involved mediastinal adenopathy were treated to 66 Gy in 33 fractions of 2 Gy), completed on 11/04/2021 - her recent restaging CT scan from May 22, 2022 showed continued response in the primary site and thoracic adenopathy, or new lesions. -She is clinically doing well, lives independently, tolerating Tagrisso  well, will continue. - restaging CT from 03/19/2023 showed post radiation change in lung and no evidence of recurrence. Will continue Tagrisso   -she we will continue follow-up with his radiation oncologist Dr. Patrcia for her brain metastasis, as single fraction SRS was recommended based on her recent brain MRI from March 18, 2023 -restaging CT CAP 06/22/2023 showed stable diease, no progression  -PET 10/16/2023 showed SD -On Tagrisso , on hold during recent current infections   Disposition:  Ms. Shellenbarger appears to be recovering gradually. No recurrent fever. She continues antibiotics. Encouraged her to continue nutrition and ambulation to promote healing. She will complete antibiotics this weekend. As long as she remains afebrile and symptoms continue to improve she can restart Tagrisso  9/8.   She will f/up with PCP next week. Scheduled to return to us  10/14. Will see her sooner if needed.    I discussed the assessment and treatment plan with the patient. The patient was provided an opportunity to ask questions and all were answered. The patient agreed with the plan and demonstrated an understanding of the instructions.   The patient was advised to call back or seek an in-person evaluation if the symptoms worsen or if the condition fails to improve as anticipated. No barriers to learning were detected. I spent 10 minutes counseling the patient non face to face. The total time spent in the appointment was 15 minutes and more than 50% was on counseling,  review of test results, and coordination of care.   Alaia Lordi K Dierdra Salameh, NP 02/23/2024

## 2024-02-25 ENCOUNTER — Other Ambulatory Visit (HOSPITAL_COMMUNITY): Payer: Self-pay

## 2024-02-25 ENCOUNTER — Other Ambulatory Visit: Payer: Self-pay

## 2024-02-25 NOTE — Progress Notes (Signed)
 Specialty Pharmacy Refill Coordination Note  Abigail Golden is a 58 y.o. female contacted today regarding refills of specialty medication(s) Osimertinib  Mesylate (TAGRISSO )   Patient requested Delivery   Delivery date: 03/08/24   Verified address: [address]  [place],  [place] [address]   Medication will be filled on 03/07/24.

## 2024-03-07 ENCOUNTER — Other Ambulatory Visit: Payer: Self-pay

## 2024-03-30 ENCOUNTER — Encounter (INDEPENDENT_AMBULATORY_CARE_PROVIDER_SITE_OTHER): Payer: Self-pay

## 2024-03-30 ENCOUNTER — Other Ambulatory Visit: Payer: Self-pay

## 2024-03-31 ENCOUNTER — Other Ambulatory Visit: Payer: Self-pay

## 2024-03-31 ENCOUNTER — Other Ambulatory Visit (HOSPITAL_COMMUNITY): Payer: Self-pay

## 2024-03-31 NOTE — Progress Notes (Signed)
 Specialty Pharmacy Refill Coordination Note  MyChart Questionnaire Submission  Abigail Golden is a 58 y.o. female contacted today regarding refills of specialty medication(s) Tagrisso .  Doses on hand: (Patient-Rptd) 14   Patient requested: (Patient-Rptd) Delivery   Delivery date: 04/01/24  Verified address: [address] [place] [address]  Medication will be filled on 03/31/24.

## 2024-04-05 ENCOUNTER — Inpatient Hospital Stay: Attending: Hematology

## 2024-04-05 ENCOUNTER — Ambulatory Visit (HOSPITAL_COMMUNITY)
Admission: RE | Admit: 2024-04-05 | Discharge: 2024-04-05 | Disposition: A | Discharge status: Home / Self Care | Source: Ambulatory Visit | Attending: Radiation Oncology | Admitting: Radiation Oncology

## 2024-04-05 ENCOUNTER — Inpatient Hospital Stay: Admitting: Hematology

## 2024-04-05 ENCOUNTER — Other Ambulatory Visit: Payer: Self-pay

## 2024-04-05 VITALS — BP 132/57 | HR 71 | Temp 98.1°F | Resp 18 | Ht 61.0 in | Wt 253.0 lb

## 2024-04-05 DIAGNOSIS — C7931 Secondary malignant neoplasm of brain: Secondary | ICD-10-CM | POA: Insufficient documentation

## 2024-04-05 DIAGNOSIS — C3412 Malignant neoplasm of upper lobe, left bronchus or lung: Secondary | ICD-10-CM

## 2024-04-05 LAB — CBC WITH DIFFERENTIAL (CANCER CENTER ONLY)
Abs Immature Granulocytes: 0 K/uL (ref 0.00–0.07)
Basophils Absolute: 0 K/uL (ref 0.0–0.1)
Basophils Relative: 0 %
Eosinophils Absolute: 0 K/uL (ref 0.0–0.5)
Eosinophils Relative: 0 %
HCT: 33.2 % — ABNORMAL LOW (ref 36.0–46.0)
Hemoglobin: 11 g/dL — ABNORMAL LOW (ref 12.0–15.0)
Immature Granulocytes: 0 %
Lymphocytes Relative: 34 %
Lymphs Abs: 0.9 K/uL (ref 0.7–4.0)
MCH: 28.8 pg (ref 26.0–34.0)
MCHC: 33.1 g/dL (ref 30.0–36.0)
MCV: 86.9 fL (ref 80.0–100.0)
Monocytes Absolute: 0.4 K/uL (ref 0.1–1.0)
Monocytes Relative: 15 %
Neutro Abs: 1.3 K/uL — ABNORMAL LOW (ref 1.7–7.7)
Neutrophils Relative %: 51 %
Platelet Count: 180 K/uL (ref 150–400)
RBC: 3.82 MIL/uL — ABNORMAL LOW (ref 3.87–5.11)
RDW: 15.2 % (ref 11.5–15.5)
WBC Count: 2.5 K/uL — ABNORMAL LOW (ref 4.0–10.5)
nRBC: 0 % (ref 0.0–0.2)

## 2024-04-05 LAB — CMP (CANCER CENTER ONLY)
ALT: 12 U/L (ref 0–44)
AST: 18 U/L (ref 15–41)
Albumin: 4.2 g/dL (ref 3.5–5.0)
Alkaline Phosphatase: 78 U/L (ref 38–126)
Anion gap: 4 — ABNORMAL LOW (ref 5–15)
BUN: 17 mg/dL (ref 6–20)
CO2: 28 mmol/L (ref 22–32)
Calcium: 9.8 mg/dL (ref 8.9–10.3)
Chloride: 105 mmol/L (ref 98–111)
Creatinine: 1.16 mg/dL — ABNORMAL HIGH (ref 0.44–1.00)
GFR, Estimated: 55 mL/min — ABNORMAL LOW (ref >60)
Glucose, Bld: 85 mg/dL (ref 70–99)
Potassium: 4.7 mmol/L (ref 3.5–5.1)
Sodium: 137 mmol/L (ref 135–145)
Total Bilirubin: 0.5 mg/dL (ref 0.0–1.2)
Total Protein: 6.9 g/dL (ref 6.5–8.1)

## 2024-04-05 MED ORDER — SULFAMETHOXAZOLE-TRIMETHOPRIM 800-160 MG PO TABS: 1.0000 | ORAL_TABLET | Freq: Two times a day (BID) | ORAL | 1 refills | Status: DC

## 2024-04-05 MED ORDER — OSIMERTINIB MESYLATE 80 MG PO TABS
80.0000 mg | ORAL_TABLET | Freq: Every day | ORAL | 3 refills | Status: AC
  Filled 2024-04-05 – 2024-05-03 (×3): qty 30, 30d supply, fill #0
  Filled 2024-05-27: qty 30, 30d supply, fill #1
  Filled 2024-07-01: qty 30, 30d supply, fill #2

## 2024-04-05 MED ORDER — NYSTATIN 100000 UNIT/GM EX POWD: CUTANEOUS | 5 refills | Status: AC

## 2024-04-05 MED ORDER — GADOBUTROL 1 MMOL/ML IV SOLN
10.0000 mL | Freq: Once | INTRAVENOUS | Status: AC | PRN
  Administered 2024-04-05: 10 mL via INTRAVENOUS

## 2024-04-05 NOTE — Assessment & Plan Note (Signed)
 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 She tolerates moderately well with mucositis and skin toxicities.   -she had thoracic radiation (The primary tumor and involved mediastinal adenopathy were treated to 66 Gy in 33 fractions of 2 Gy), completed on 11/04/2021 - her recent restaging CT scan from May 22, 2022 showed continued response in the primary site and thoracic adenopathy, or new lesions. -She is clinically doing well, lives independently, tolerating Tagrisso  well, will continue. - restaging CT from 03/19/2023 showed post radiation change in lung and no evidence of recurrence. Will continue Tagrisso   -she we will continue follow-up with his radiation oncologist Dr. Patrcia for her brain metastasis, as single fraction SRS was recommended based on her recent brain MRI from March 18, 2023 -restaging CT CAP 06/22/2023 showed stable diease, no progression  -PET 10/16/2023 and 01/26/2024 showed SD

## 2024-04-05 NOTE — Progress Notes (Signed)
 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 She tolerates moderately well with mucositis and skin toxicities.   -she had thoracic radiation (The primary tumor and involved mediastinal adenopathy were treated to 66 Gy in 33 fractions of 2 Gy), completed on 11/04/2021 - her recent restaging CT scan from May 22, 2022 showed continued response in the primary site and thoracic adenopathy, or new lesions. -She is clinically doing well, lives independently, tolerating Tagrisso  well, will continue. - restaging CT from 03/19/2023 showed post radiation change in lung and no evidence of recurrence. Will continue Tagrisso   -she we will continue follow-up with his radiation oncologist Dr. 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 Treatment was paused for 14 days due to recent illness but has been restarted. Recent blood work shows hemoglobin at 11, white count slightly low, and normal platelet count, likely due to Tegresol. Kidney and liver function tests are within normal limits. No current respiratory  distress or signs of pneumonia. - Continue Tegresol as prescribed - Ensure adequate hydration - Schedule PET scan before next follow-up - Order blood work on the same day as PET scan   Skin and soft tissue infection, improving Recent MRSA skin infection, initially treated with doxycycline and topical cream, later required hospitalization for intravenous vancomycin . Current skin condition shows improvement with ongoing care, but a new open wound with redness has developed. Differential includes recurrent infection or irritation due to moisture and friction. Bactrim  is considered a better option for oral antibiotic treatment due to previous adverse reactions to minocycline and potential ineffectiveness of doxycycline. - Prescribe Bactrim  for 7 days if the wound worsens - Continue using nystatin  powder to keep the area dry - Provide a refill for nystatin  powder - Monitor the wound for signs of improvement or worsening - Educate on wound care and maintaining dryness   Plan - I called in Bactrim  for her right abdominal wall skin infection - Continue Tagrisso  - Follow-up in 2 months with repeated the PET scan and lab 1 week before  SUMMARY OF ONCOLOGIC HISTORY: Oncology History Overview Note   Cancer Staging  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) Staging form: Lung, AJCC 8th Edition - Clinical stage from 04/24/2021: Stage IVA (cT2, cN2, pM1b) - Signed by [name], MD on 05/14/2021    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)  04/22/2021 Imaging   CT HEAD WO CONTRAST   IMPRESSION: Two separate areas of vasogenic edema within the left hemisphere, 1 within the inferior frontal region in the other at the left parietal vertex. Metastatic disease would be the most likely cause of this appearance. Other possibilities include venous  infarctions and cerebritis. MRI with contrast recommended.    04/23/2021 Imaging   MR Brain W and Wo Contrast  IMPRESSION: 1.  Metastatic disease to the brain. Two enhancing brain masses with moderate associated vasogenic edema: solid 16 mm left parietal lobe mass, and a larger 29 mm rim enhancing cystic or necrotic mass in the left inferior frontal gyrus. Two other small 2-3 mm metastases in the right inferior frontal gyrus, right posterior temporal lobe. And questionable two additional punctate metastases in both superior frontal gyri abutting the falx (series 18, image 46). 2. No significant intracranial mass effect. No other intracranial abnormality.   04/23/2021 Imaging   CT Chest W Contrast  IMPRESSION: 4.5 cm spiculated left upper lobe mass most compatible with primary lung cancer. This abuts the medial pleural surface. A few small adjacent pre-vascular mediastinal lymph nodes, none pathologically enlarged. Recommend cardiothoracic surgical and oncologic consultation.   No acute findings or evidence of metastatic disease in the abdomen or pelvis.   04/24/2021 Cancer Staging   Staging form: Lung, AJCC 8th Edition - Clinical stage from 04/24/2021: Stage IVA (cT2, cN2, pM1b) - Signed by [name], MD on 05/14/2021   04/24/2021 Initial Biopsy   FINAL MICROSCOPIC DIAGNOSIS:   A. LUNG, LUL, FINE NEEDLE ASPIRATION:  - Malignant cells present, consistent with adenocarcinoma   B. LUNG, LUL, BRUSHING:  - Malignant cells consistent with adenocarcinoma, see comment    COMMENT:  B.  Immunohistochemical stains show that the tumor cells are positive for TTF-1 while they are negative for p63 and CK5/6, consistent with above interpretation. The number of tumor cells appears to be insufficient for molecular studies.   05/03/2021 Initial Diagnosis   Lung cancer (HCC)   05/08/2021 - 05/13/2021 Radiation Therapy   SRS treatment to the metastatic brain lesions under the care of Dr. Patrcia    08/05/2021 Imaging   EXAM: CT CHEST, ABDOMEN, AND PELVIS WITH CONTRAST  IMPRESSION: 1. No substantial change in size of the left  upper lobe pulmonary lesion. 2. Borderline to mildly enlarged adjacent prevascular/AP window lymph nodes have increased in size in the interval. This finding raises concern for disease progression. 3. Interval development of upper normal bilateral common iliac and left external iliac lymph nodes. Close attention on follow-up recommended. 4. No other evidence for metastatic disease in the abdomen or pelvis.   09/22/2022 Imaging    IMPRESSION: 1. Area of chronic postradiation mass-like fibrosis in the posteromedial aspect of the left upper lung, without definitive findings of locally recurrent disease or definite metastatic disease in the chest, abdomen or pelvis. 2. Mild atherosclerosis. 3. Additional incidental findings, as above.     03/19/2023 Imaging   CT chest,abdomen, and pelvis with contrast IMPRESSION: 1. Unchanged appearance of posterior paramedian left upper lobe with dense post treatment fibrosis and volume loss. 2. No evidence of recurrent or metastatic disease in the chest, abdomen, or pelvis. 3. Mildly coarse contour of the liver, suggestive of cirrhosis. Correlate with biochemical findings. 4. Status post cholecystectomy and hysterectomy.  Aortic Atherosclerosis (ICD10-       Discussed the use of AI scribe software for clinical note transcription with the patient, who gave verbal consent to proceed.  History of Present Illness Abigail Golden is a 57 year old female with metastatic non-small cell lung cancer with EGFR mutation who presents for follow-up.  She was recently hospitalized for a MRSA infection and COVID-19, which led to a massive, oozing abdominal abscess. Despite initial treatment with doxycycline  and a topical cream, the abscess worsened, causing fever and rupture. She received vancomycin  during hospitalization but experienced complications with IV administration, resulting in swelling and requiring an antidote.  She has undergone several  rounds of brain radiation and 33 days of left lung radiation since her diagnosis of brain and lung cancer in November 2022. She is on an oral chemotherapy pill, which was paused for 14 days during her recent illness due to immunosuppression but has since been restarted.  A new open wound has developed, managed with nystatin  powder and gauze, and has been red and bleeding since Saturday. She is allergic to minocycline but tolerates doxycycline, with five doses remaining.  She uses Ambien  for sleep and nystatin  powder daily to prevent infections. She reports feeling better overall but experienced significant weakness during her recent illness. She has no current breathing difficulties but experiences puffiness with activity. She has been double masking to prevent COVID-19 exposure.     All other systems were reviewed with the patient and are negative.  MEDICAL HISTORY:  Past Medical History:  Diagnosis Date   Allergy    Anxiety    Chronic bronchitis (HCC)    Depression    Headache    Hypoglycemia    Lower back pain    since fall ~ 2012 (05/25/2015)   Migraine    05/25/2015 probably once/month   Migraine headache    Pain in left hip    since fall ~ 2012 (05/25/2015)   Pneumonia several times    SURGICAL HISTORY: Past Surgical History:  Procedure Laterality Date   ABDOMINAL HYSTERECTOMY  11/2002   found benign tumors   BRONCHIAL BIOPSY  04/24/2021   Procedure: BRONCHIAL BIOPSIES;  Surgeon: Brenna Adine CROME, DO;  Location: [facility];  Service: Pulmonary;;   BRONCHIAL BRUSHINGS  04/24/2021   Procedure: BRONCHIAL BRUSHINGS;  Surgeon: Brenna Adine CROME, DO;  Location: [facility];  Service: Pulmonary;;   BRONCHIAL NEEDLE ASPIRATION BIOPSY  04/24/2021   Procedure: BRONCHIAL NEEDLE ASPIRATION BIOPSIES;  Surgeon: Brenna Adine CROME, DO;  Location: [facility];  Service: Pulmonary;;   CHEST TUBE INSERTION  04/24/2021   Procedure: CHEST TUBE INSERTION;  Surgeon: Brenna Adine CROME, DO;   Location: [facility];  Service: Pulmonary;;   CHOLECYSTECTOMY N/A 05/25/2015   Procedure: LAPAROSCOPIC CHOLECYSTECTOMY;  Surgeon: Lynda Leos, MD;  Location: [facility] OR;  Service: General;  Laterality: N/A;   LAPAROSCOPIC CHOLECYSTECTOMY  05/25/2015   MENISCUS REPAIR Right 03/2017   VIDEO BRONCHOSCOPY WITH ENDOBRONCHIAL NAVIGATION N/A 04/24/2021   Procedure: VIDEO BRONCHOSCOPY WITH ENDOBRONCHIAL NAVIGATION;  Surgeon: Brenna Adine CROME, DO;  Location: [facility];  Service: Pulmonary;  Laterality: N/A;   VIDEO BRONCHOSCOPY WITH RADIAL ENDOBRONCHIAL ULTRASOUND  04/24/2021   Procedure: VIDEO BRONCHOSCOPY WITH RADIAL ENDOBRONCHIAL ULTRASOUND;  Surgeon: Brenna Adine CROME, DO;  Location: [facility];  Service: Pulmonary;;    I have reviewed the social history and family history with the patient and they are unchanged from previous note.  ALLERGIES:  is allergic to celebrex [celecoxib] and minocycline.  MEDICATIONS:  Current Outpatient Medications  Medication Sig Dispense Refill   sulfamethoxazole -trimethoprim  (BACTRIM  DS) 800-160 MG tablet Take 1 tablet by mouth 2 (two) times daily. 14 tablet 1   acetaminophen  (TYLENOL ) 500 MG tablet Take 500 mg by mouth every 6 (six) hours as needed for moderate pain or headache.     albuterol  (VENTOLIN  HFA) 108 (90 Base) MCG/ACT inhaler Inhale 2 puffs into the lungs every 4 (four) hours as needed.  ALPRAZolam  (XANAX ) 0.25 MG tablet Take 0.25 mg by mouth every 4 (four) hours as needed for anxiety. for anxiety     citalopram  (CELEXA ) 10 MG tablet Take 10 mg by mouth at bedtime.     Diclofenac Potassium,Migraine, 50 MG PACK Take 1 each by mouth as needed. As needed for migraines     diclofenac sodium (VOLTAREN) 1 % GEL Apply topically as needed. To affected area     diphenhydrAMINE  (BENADRYL ) 25 MG tablet Take 25 mg by mouth See admin instructions. 25 mg at bedtime 25 mg as needed for itching     Docusate Sodium  (STOOL SOFTENER LAXATIVE PO) Take 1 tablet by mouth  as needed.     eletriptan (RELPAX) 20 MG tablet Take 20 mg by mouth as needed for headache (at onset of migraine.). May repeat in 2 hours if needed.     fluticasone (FLONASE) 50 MCG/ACT nasal spray Place 2 sprays into both nostrils daily as needed for allergies.     HYDROcodone  bit-homatropine (HYCODAN) 5-1.5 MG/5ML syrup Take 5 mLs by mouth every 6 (six) hours as needed for cough (Cough not relieved with Tessalon  Perles). 120 mL 0   hydrOXYzine  (ATARAX /VISTARIL ) 25 MG tablet Take 25 mg by mouth See admin instructions. 25 mg at bedtime 25 mg during the day as needed for itching/anxiety     loratadine  (CLARITIN ) 10 MG tablet Take 10 mg by mouth daily.     montelukast  (SINGULAIR ) 10 MG tablet Take 10 mg by mouth at bedtime.     mupirocin  ointment (BACTROBAN ) 2 % Apply 1 Application topically 3 (three) times daily.     nystatin  powder APPLY  POWDER TOPICALLY TO AFFECTED AREA THREE TIMES DAILY 60 g 5   ondansetron  (ZOFRAN -ODT) 4 MG disintegrating tablet DISSOLVE 1 TABLET IN MOUTH EVERY 8 HOURS AS NEEDED FOR NAUSEA AND VOMITING 18 tablet 1   osimertinib  mesylate (TAGRISSO ) 80 MG tablet Take 1 tablet (80 mg total) by mouth daily. 30 tablet 3   prochlorperazine  (COMPAZINE ) 10 MG tablet TAKE 1 TABLET BY MOUTH EVERY 6 HOURS AS NEEDED FOR NAUSEA FOR VOMITING 30 tablet 1   solifenacin (VESICARE) 10 MG tablet Take 10 mg by mouth every evening.     traMADol  (ULTRAM ) 50 MG tablet Take 1 tablet (50 mg total) by mouth every 6 (six) hours as needed. 60 tablet 1   triamcinolone cream (KENALOG) 0.1 % Apply 1 Application topically 2 (two) times daily.     zolpidem  (AMBIEN ) 10 MG tablet TAKE 1/2 TO 1 (ONE-HALF TO ONE) TABLET BY MOUTH AT BEDTIME AS NEEDED FOR SLEEP (Patient taking differently: Take 5-10 mg by mouth at bedtime. TAKE 1/2 TO 1 (ONE-HALF TO ONE) TABLET BY MOUTH AT BEDTIME AS NEEDED FOR SLEEP) 60 tablet 1   No current facility-administered medications for this visit.    PHYSICAL EXAMINATION: ECOG  PERFORMANCE STATUS: 1 - Symptomatic but completely ambulatory  Vitals:   04/05/24 1129  BP: (!) 132/57  Pulse: 71  Resp: 18  Temp: 98.1 F (36.7 C)  SpO2: 100%   Wt Readings from Last 3 Encounters:  04/05/24 253 lb (114.8 kg)  02/17/24 248 lb 3.8 oz (112.6 kg)  02/04/24 254 lb 9.6 oz (115.5 kg)     GENERAL:alert, no distress and comfortable SKIN: skin color, texture, turgor are normal, (+) diffuse skin erythema with a 1 open shallow ulcer in the right lower quadrant of abdominal skin fold EYES: normal, Conjunctiva are pink and non-injected, sclera clear NECK: supple, thyroid normal  size, non-tender, without nodularity LYMPH:  no palpable lymphadenopathy in the cervical, axillary  LUNGS: clear to auscultation and percussion with normal breathing effort HEART: regular rate & rhythm and no murmurs and no lower extremity edema ABDOMEN:abdomen soft, non-tender and normal bowel sounds Musculoskeletal:no cyanosis of digits and no clubbing  NEURO: alert & oriented x 3 with fluent speech, no focal motor/sensory deficits  Physical Exam   LABORATORY DATA:  I have reviewed the data as listed    Latest Ref Rng & Units 04/05/2024   11:02 AM 02/17/2024    4:51 AM 02/16/2024    5:38 PM  CBC  WBC 4.0 - 10.5 K/uL 2.5  4.7  4.3   Hemoglobin 12.0 - 15.0 g/dL 88.9  89.5  88.4   Hematocrit 36.0 - 46.0 % 33.2  32.4  37.0   Platelets 150 - 400 K/uL 180  186  224         Latest Ref Rng & Units 04/05/2024   11:02 AM 02/17/2024    4:51 AM 02/16/2024    5:38 PM  CMP  Glucose 70 - 99 mg/dL 85  895  896   BUN 6 - 20 mg/dL 17  17  18    Creatinine 0.44 - 1.00 mg/dL 8.83  8.84  8.77   Sodium 135 - 145 mmol/L 137  135  136   Potassium 3.5 - 5.1 mmol/L 4.7  4.3  4.4   Chloride 98 - 111 mmol/L 105  104  101   CO2 22 - 32 mmol/L 28  17  21    Calcium 8.9 - 10.3 mg/dL 9.8  9.0  9.9   Total Protein 6.5 - 8.1 g/dL 6.9   7.5   Total Bilirubin 0.0 - 1.2 mg/dL 0.5   0.4   Alkaline Phos 38 - 126 U/L 78    151   AST 15 - 41 U/L 18   31   ALT 0 - 44 U/L 12   63       RADIOGRAPHIC STUDIES: I have personally reviewed the radiological images as listed and agreed with the findings in the report. No results found.    Orders Placed This Encounter  Procedures   NM PET Image Restag (PS) Skull Base To Thigh    Standing Status:   Future    Expected Date:   05/30/2024    Expiration Date:   04/05/2025    If indicated for the ordered procedure, I authorize the administration of a radiopharmaceutical per Radiology protocol:   Yes    Is the patient pregnant?:   No    Preferred imaging location?:   [facility]   All questions were answered. The patient knows to call the clinic with any problems, questions or concerns. No barriers to learning was detected. The total time spent in the appointment was 30 minutes, including review of chart and various tests results, discussions about plan of care and coordination of care plan     Onita Mattock, MD 04/05/2024

## 2024-04-06 ENCOUNTER — Ambulatory Visit: Payer: Self-pay | Admitting: Radiation Oncology

## 2024-04-06 NOTE — Progress Notes (Signed)
 Abigail Golden, please set-up [facility]

## 2024-04-07 NOTE — Progress Notes (Signed)
 CLINICAL HISTORY: Brain metastases, assess treatment response.   Follow up call to receive Brain MRI results from 04/05/24:  IMPRESSION: 1. New 2 mm enhancing focus in the left middle frontal gyrus suspicious for metastasis. No edema. 2. Slightly increased volume of irregular nodular enhancement spanning the inferior left frontal and left temporal lobes with mixed interval changes of associated mild edema, indeterminate for post-treatment changes or tumor. 3. Unchanged metastases elsewhere.  No chest pain or SOB per patient. She was having some leg pain/cramping last night that she attributes to over doing it this weekend. It is relieved with pain medication. No other complaints today.

## 2024-04-11 ENCOUNTER — Encounter: Payer: Self-pay | Admitting: Urology

## 2024-04-11 ENCOUNTER — Inpatient Hospital Stay

## 2024-04-11 ENCOUNTER — Ambulatory Visit
Admission: RE | Admit: 2024-04-11 | Discharge: 2024-04-11 | Disposition: A | Discharge status: Home / Self Care | Source: Ambulatory Visit | Attending: Urology | Admitting: Urology

## 2024-04-11 DIAGNOSIS — C3490 Malignant neoplasm of unspecified part of unspecified bronchus or lung: Secondary | ICD-10-CM

## 2024-04-12 NOTE — Progress Notes (Signed)
  Radiation Oncology         [phone] ________________________________  Name: FLORNCE RECORD MRN: [id number]  Date: 04/14/2024  DOB: 08-08-1965  SIMULATION AND TREATMENT PLANNING NOTE    ICD-10-CM   1. Malignant neoplasm metastatic to brain [facility])  C79.31       DIAGNOSIS:  58 yo woman with a solitary 2 mm brain metastasis of the left frontal lobe  NARRATIVE:  The patient was brought to the CT Simulation planning suite.  Identity was confirmed.  All relevant records and images related to the planned course of therapy were reviewed.  The patient freely provided informed written consent to proceed with treatment after reviewing the details related to the planned course of therapy. The consent form was witnessed and verified by the simulation staff. Intravenous access was established for contrast administration. Then, the patient was set-up in a stable reproducible supine position for radiation therapy.  A relocatable thermoplastic stereotactic head frame was fabricated for precise immobilization.  CT images were obtained.  Surface markings were placed.  The CT images were loaded into the planning software and fused with the patient's targeting MRI scan.  Then the target and avoidance structures were contoured.  Treatment planning then occurred.  The radiation prescription was entered and confirmed.  I have requested 3D planning  I have requested a DVH of the following structures: Brain stem, brain, left eye, right eye, lenses, optic chiasm, target volumes, uninvolved brain, and normal tissue.    SPECIAL TREATMENT PROCEDURE:  The planned course of therapy using radiation constitutes a special treatment procedure. Special care is required in the management of this patient for the following reasons. This treatment constitutes a Special Treatment Procedure for the following reason: High dose per fraction requiring special monitoring for increased toxicities of treatment including daily imaging.  The  special nature of the planned course of radiotherapy will require increased physician supervision and oversight to ensure patient's safety with optimal treatment outcomes.  This requires extended time and effort.  PLAN:  The patient will receive 20 Gy in 1 fraction.  ________________________________  Donnice FELIX Patrcia, M.D.

## 2024-04-13 ENCOUNTER — Ambulatory Visit: Admitting: Urology

## 2024-04-13 DIAGNOSIS — C7931 Secondary malignant neoplasm of brain: Secondary | ICD-10-CM | POA: Insufficient documentation

## 2024-04-13 NOTE — Progress Notes (Signed)
 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 Lung cancer metastatic to brain [facility])  C34.90    C79.31        Interval Since Last Radiation:  1 year   First Treatment Date: 2023-03-30 - Last Treatment Date: 2023-03-30   Plan Name: Brain_SRS Site: Brain Technique: SBRT/SRT-IMRT Mode: Photon Dose Per Fraction: 20 Gy Prescribed Dose (Delivered / Prescribed): 20 Gy / 20 Gy Prescribed Fxs (Delivered / Prescribed): 1 / 1  12/10/21: Single fraction SRS brain PTV9 left frontal lobe    09/19/21 - 11/04/21: The primary tumor in the LUL lung and involved mediastinal adenopathy were treated to 66 Gy in 33 fractions of 2 Gy.   08/27/21: Single fraction SRS Brain PTV7-8 in left frontal lobe    05/08/21 - 05/13/21: Fractionated SRS Brain PTV1 - PTV6      Narrative:  I spoke with the patient to conduct her routine scheduled 3 month follow up visit to review results of her recent MRI brain scan via telephone to spare the patient unnecessary potential exposure in the healthcare setting. The patient was notified in advance and gave permission to proceed with this visit format.   She has recovered well from the effects of her previous SRS treatments. Her most recent post-treatment MRI brain scan from 04/05/24 was reviewed in our recent multi disciplinary brain conference on 04/11/2024 and shows a new 2 mm enhancing focus in the left middle frontal gyrus suspicious for metastasis but without edema. There is a slightly increased volume of irregular nodular enhancement  spanning the the inferior left frontal and left temporal lobes with mixed interval changes of associated mild edema felt to be most consistent with post-treatment changes as opposed to tumor progression and an unchanged of other previously treated metastases elsewhere. We reviewed these results today by telephone as well as the recommendation to proceed with a single fraction SRS to the new 2 mm lesion in the left middle frontal gyrus.   On review of systems, the patient states that she is doing well overall. She reports having some occasional headaches that respond to her migraine medications prn. She specifically denies any nausea/vomiting, visual or auditory changes, issues with balance or coordination, difficulties with walking, or numbness/tingling in her extremities. 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 Otherwise, she is without complaints and pleased with her progress to date. She continues to tolerate the Tagrisso  fairly well and has remained in close follow up with Dr. Lanny for management of her systemic disease. She is scheduled for restaging PET 05/31/24.   ALLERGIES:  is allergic to celebrex [celecoxib] and minocycline.  Meds: Current Outpatient Medications  Medication Sig Dispense Refill   acetaminophen  (TYLENOL ) 500 MG tablet Take 500 mg by mouth every 6 (six) hours as needed for moderate pain or headache.     albuterol  (VENTOLIN  HFA) 108 (90 Base) MCG/ACT inhaler Inhale 2 puffs into  the lungs every 4 (four) hours as needed.     ALPRAZolam  (XANAX ) 0.25 MG tablet Take 0.25 mg by mouth every 4 (four) hours as needed for anxiety. for anxiety     citalopram  (CELEXA ) 10 MG tablet Take 10 mg by mouth at bedtime.     Diclofenac Potassium,Migraine, 50 MG PACK Take 1 each by mouth as needed. As needed for migraines     diclofenac sodium (VOLTAREN)  1 % GEL Apply topically as needed. To affected area     diphenhydrAMINE  (BENADRYL ) 25 MG tablet Take 25 mg by mouth See admin instructions. 25 mg at bedtime 25 mg as needed for itching     Docusate Sodium  (STOOL SOFTENER LAXATIVE PO) Take 1 tablet by mouth as needed.     eletriptan (RELPAX) 20 MG tablet Take 20 mg by mouth as needed for headache (at onset of migraine.). May repeat in 2 hours if needed.     fluticasone (FLONASE) 50 MCG/ACT nasal spray Place 2 sprays into both nostrils daily as needed for allergies.     HYDROcodone  bit-homatropine (HYCODAN) 5-1.5 MG/5ML syrup Take 5 mLs by mouth every 6 (six) hours as needed for cough (Cough not relieved with Tessalon  Perles). 120 mL 0   hydrOXYzine  (ATARAX /VISTARIL ) 25 MG tablet Take 25 mg by mouth See admin instructions. 25 mg at bedtime 25 mg during the day as needed for itching/anxiety     loratadine  (CLARITIN ) 10 MG tablet Take 10 mg by mouth daily.     montelukast  (SINGULAIR ) 10 MG tablet Take 10 mg by mouth at bedtime.     mupirocin  ointment (BACTROBAN ) 2 % Apply 1 Application topically 3 (three) times daily.     nystatin  powder APPLY  POWDER TOPICALLY TO AFFECTED AREA THREE TIMES DAILY 60 g 5   ondansetron  (ZOFRAN -ODT) 4 MG disintegrating tablet DISSOLVE 1 TABLET IN MOUTH EVERY 8 HOURS AS NEEDED FOR NAUSEA AND VOMITING 18 tablet 1   osimertinib  mesylate (TAGRISSO ) 80 MG tablet Take 1 tablet (80 mg total) by mouth daily. 30 tablet 3   prochlorperazine  (COMPAZINE ) 10 MG tablet TAKE 1 TABLET BY MOUTH EVERY 6 HOURS AS NEEDED FOR NAUSEA FOR VOMITING 30 tablet 1   solifenacin (VESICARE) 10 MG tablet Take 10 mg by mouth every evening.     sulfamethoxazole -trimethoprim  (BACTRIM  DS) 800-160 MG tablet Take 1 tablet by mouth 2 (two) times daily. 14 tablet 1   traMADol  (ULTRAM ) 50 MG tablet Take 1 tablet (50 mg total) by mouth every 6 (six) hours as needed. 60 tablet 1   triamcinolone cream (KENALOG) 0.1 % Apply 1 Application topically 2 (two) times  daily.     zolpidem  (AMBIEN ) 10 MG tablet TAKE 1/2 TO 1 (ONE-HALF TO ONE) TABLET BY MOUTH AT BEDTIME AS NEEDED FOR SLEEP (Patient taking differently: Take 5-10 mg by mouth at bedtime. TAKE 1/2 TO 1 (ONE-HALF TO ONE) TABLET BY MOUTH AT BEDTIME AS NEEDED FOR SLEEP) 60 tablet 1   No current facility-administered medications for this encounter.    Physical Findings: Today's Vitals   04/11/24 1119  PainSc: 0-No pain   There is no height or weight on file to calculate BMI. Unable to assess due to telephone follow-up visit format.    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 Her recent MRI brain scan from 04/05/24 was reviewed in our multi disciplinary brain conference on 04/11/2024 and shows a new 2 mm enhancing focus in the left middle frontal gyrus suspicious for metastasis but without edema. There is a slightly increased volume of irregular nodular enhancement spanning the the inferior left frontal and left temporal lobes with mixed interval changes of associated mild edema felt to be most consistent with post-treatment changes as opposed to tumor progression and an unchanged of other previously treated metastases elsewhere. We reviewed these results today by telephone as well as the recommendation to proceed with a single fraction SRS to the new 2 mm lesion in the left middle frontal gyrus.   At the conclusion of our conversation, she is in agreement to proceed with a single fraction stereotactic  radiosurgery [facility]) to the new 2 mm enhancing focus in the left middle frontal gyrus and has provided verbal consent to proceed today by telephone.  She is tentatively scheduled for CT simulation/treatment planning at 11:30 AM on 04/14/2024, in anticipation of proceeding with treatment on 04/19/2024, coordinated with Dr. Lanis who will also participate in her treatment planning and treatment delivery.  We will share our discussion with Dr. Lanny and she will continue in routine follow up, under her care, for management of her systemic disease. She is currently on Tagrisso  therapy and her most recent restaging CT C/A/P from 01/26/24 showed no evidence of disease progression or recurrence.  She is scheduled for a PET scan for disease restaging on 05/31/24 and will follow up with Dr. Lanny thereafter to review those results.   We appreciate the opportunity to participate in this patient's care. She knows that she is welcome to call with any questions or concerns in the interim.   I personally spent 30 minutes in this encounter including chart review, reviewing radiological studies, telephone conversation with the patient, entering orders, coordinating care and completing documentation.   Sabra MICAEL Rusk, MMS, PA-C [facility] Radiation Oncology Physician Assistant Direct Dial: [phone]  Fax: [phone]

## 2024-04-14 ENCOUNTER — Ambulatory Visit
Admission: RE | Admit: 2024-04-14 | Discharge: 2024-04-14 | Disposition: A | Discharge status: Home / Self Care | Source: Ambulatory Visit | Attending: Radiation Oncology | Admitting: Radiation Oncology

## 2024-04-14 VITALS — BP 157/82 | HR 82 | Temp 97.9°F | Resp 20 | Ht 61.0 in | Wt 247.0 lb

## 2024-04-14 DIAGNOSIS — C7931 Secondary malignant neoplasm of brain: Secondary | ICD-10-CM | POA: Insufficient documentation

## 2024-04-14 DIAGNOSIS — Z510 Encounter for antineoplastic radiation therapy: Secondary | ICD-10-CM | POA: Diagnosis present

## 2024-04-14 DIAGNOSIS — C3412 Malignant neoplasm of upper lobe, left bronchus or lung: Secondary | ICD-10-CM | POA: Diagnosis present

## 2024-04-18 DIAGNOSIS — Z510 Encounter for antineoplastic radiation therapy: Secondary | ICD-10-CM | POA: Diagnosis not present

## 2024-04-19 ENCOUNTER — Ambulatory Visit
Admission: RE | Admit: 2024-04-19 | Discharge: 2024-04-19 | Disposition: A | Discharge status: Home / Self Care | Source: Ambulatory Visit | Attending: Radiation Oncology | Admitting: Radiation Oncology

## 2024-04-19 ENCOUNTER — Other Ambulatory Visit: Payer: Self-pay

## 2024-04-19 VITALS — BP 136/83 | HR 67 | Temp 97.8°F | Resp 20 | Ht 61.0 in

## 2024-04-19 DIAGNOSIS — C7931 Secondary malignant neoplasm of brain: Secondary | ICD-10-CM

## 2024-04-19 DIAGNOSIS — Z510 Encounter for antineoplastic radiation therapy: Secondary | ICD-10-CM | POA: Diagnosis not present

## 2024-04-19 LAB — RAD ONC ARIA SESSION SUMMARY
Course Elapsed Days: 0
Plan Fractions Treated to Date: 1
Plan Prescribed Dose Per Fraction: 20 Gy
Plan Total Fractions Prescribed: 1
Plan Total Prescribed Dose: 20 Gy
Reference Point Dosage Given to Date: 20 Gy
Reference Point Session Dosage Given: 20 Gy
Session Number: 1

## 2024-04-19 NOTE — Progress Notes (Signed)
 Patient rested with us  for 30 minutes following her SRS treatment. Patient denies headache, dizziness, nausea, diplopia or ringing in the ears. Patient does have some fatigue.Understands to avoid strenuous activity for the next 24 hours and call [phone] with needs.   BP 136/83 (BP Location: Left Arm, Patient Position: Sitting, Cuff Size: Large)   Pulse 67   Temp 97.8 F (36.6 C)   Resp 20   Ht 5' 1 (1.549 m)   SpO2 100%   BMI 46.67 kg/m

## 2024-04-19 NOTE — Progress Notes (Signed)
  Radiation Oncology         [phone] ________________________________  Stereotactic Treatment Procedure Note  Name: Abigail Golden MRN: [id number]  Date: 04/19/2024  DOB: Mar 23, 1966  SPECIAL TREATMENT PROCEDURE    ICD-10-CM   1. Malignant neoplasm metastatic to brain [facility])  C79.31       3D TREATMENT PLANNING AND DOSIMETRY:  The patient's radiation plan was reviewed and approved by neurosurgery and radiation oncology prior to treatment.  It showed 3-dimensional radiation distributions overlaid onto the planning CT/MRI image set.  The DVH's for the target structures as well as the organs at risk were reviewed. The documentation of the 3D plan and dosimetry are filed in the radiation oncology EMR.  NARRATIVE:  Abigail Golden was brought to the TrueBeam stereotactic radiation treatment machine and placed supine on the CT couch. The head frame was applied, and the patient was set up for stereotactic radiosurgery.  Neurosurgery was present for the set-up and delivery  SIMULATION VERIFICATION:  In the couch zero-angle position, the patient underwent Exactrac imaging using the Brainlab system with orthogonal KV images.  These were carefully aligned and repeated to confirm treatment position for each of the isocenters.  The Exactrac snap film verification was repeated at each couch angle.  PROCEDURE: Abigail Golden received stereotactic radiosurgery to the following targets: Left Frontal 2 mm target was treated using 4 Dynamic Conformal Arcs to a prescription dose of 20 Gy.  ExacTrac registration was performed for each couch angle.  The 100% isodose line was prescribed.  6 MV X-rays were delivered in the flattening filter free beam mode.  STEREOTACTIC TREATMENT MANAGEMENT:  Following delivery, the patient was transported to nursing in stable condition and monitored for possible acute effects.  Vital signs were recorded BP 136/83 (BP Location: Left Arm, Patient Position: Sitting, Cuff Size:  Large)   Pulse 67   Temp 97.8 F (36.6 C)   Resp 20   Ht 5' 1 (1.549 m)   SpO2 100%   BMI 46.67 kg/m . The patient tolerated treatment without significant acute effects, and was discharged to home in stable condition.    PLAN: Follow-up in one month.  ________________________________  Abigail Golden, M.D.

## 2024-04-19 NOTE — Op Note (Signed)
  Name: Abigail Golden  MRN: [id number]  Date: 04/19/2024   DOB: Jul 15, 1965  Stereotactic Radiosurgery Operative Note  PRE-OPERATIVE DIAGNOSIS:  Brain Metastasis  POST-OPERATIVE DIAGNOSIS:  Brain Metastasis  PROCEDURE:  Stereotactic Radiosurgery  SURGEON:  Gerldine JAYSON Maizes, MD  NARRATIVE: The patient underwent a radiation treatment planning session in the radiation oncology simulation suite under the care of the radiation oncology physician and physicist.  I participated closely in the radiation treatment planning afterwards. The patient underwent planning CT which was fused to 3T high resolution MRI with 1 mm axial slices.  These images were fused on the planning system.  We contoured the gross target volumes and subsequently expanded this to yield the Planning Target Volume. I actively participated in the planning process.  I helped to define and review the target contours and also the contours of the optic pathway, eyes, brainstem and selected nearby organs at risk.  All the dose constraints for critical structures were reviewed and compared to AAPM Task Group 101.  The prescription dose conformity was reviewed.  I approved the plan electronically.    Accordingly, [name] was brought to the TrueBeam stereotactic radiation treatment linac and placed in the custom immobilization mask.  The patient was aligned with BrainLab Exactrac, then orthogonal x-rays were used in ExacTrac with the 6DOF robotic table and the shifts were made to align the patient  [name] received stereotactic radiosurgery uneventfully to an Rx dose of 20Gy to the new left frontal metastasis.  The detailed description of the procedure is recorded in the radiation oncology procedure note.  I was present for the duration of the procedure.  DISPOSITION:  Following delivery, the patient was transported to nursing in stable condition and monitored for possible acute effects to be discharged to home in stable  condition with follow-up in one month.  Gerldine JAYSON Maizes, MD 04/19/2024 1:43 PM

## 2024-04-20 NOTE — Radiation Completion Notes (Signed)
 Patient Name: Abigail Golden, Abigail Golden MRN: [id number] Date of Birth: 1965/08/04 Referring Physician: JOSETTE AHO, M.D. Date of Service: 2024-04-20 Radiation Oncologist: Adina Barge, M.D. [facility]                             RADIATION ONCOLOGY END OF TREATMENT NOTE     Diagnosis: C79.31 Secondary malignant neoplasm of brain Staging on 2021-04-24: 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) T=cT2, N=cN2, M=pM1b Intent: Curative     ==========DELIVERED PLANS==========  First Treatment Date: 2024-04-19 Last Treatment Date: 2024-04-19   Plan Name: Brain_SRS Site: Brain Technique: 3D Mode: Photon Dose Per Fraction: 20 Gy Prescribed Dose (Delivered / Prescribed): 20 Gy / 20 Gy Prescribed Fxs (Delivered / Prescribed): 1 / 1     ==========ON TREATMENT VISIT DATES========== 2024-04-19     ==========UPCOMING VISITS========== 06/08/2024 [facility] ONCOLOGY EST PT 20 [name], MD  05/31/2024 [facility] MEDICINE NM PET WB & SB TO MID THIGH [facility] PET CT 1  05/31/2024 [facility] ONCOLOGY LAB [facility] LAB  05/23/2024 [facility]RADIATION ONC POST TREATMENT CALL [facility]POST TREATMENT        ==========APPENDIX - ON TREATMENT VISIT NOTES==========   See weekly On Treatment Notes in Epic for details in the Media tab (listed as Progress notes on the On Treatment Visit Dates listed above).

## 2024-04-22 ENCOUNTER — Other Ambulatory Visit: Payer: Self-pay | Admitting: Radiation Therapy

## 2024-04-22 ENCOUNTER — Other Ambulatory Visit (HOSPITAL_COMMUNITY): Payer: Self-pay

## 2024-04-22 DIAGNOSIS — C7931 Secondary malignant neoplasm of brain: Secondary | ICD-10-CM

## 2024-04-25 ENCOUNTER — Other Ambulatory Visit: Payer: Self-pay | Admitting: Hematology

## 2024-04-25 ENCOUNTER — Telehealth: Payer: Self-pay

## 2024-04-25 ENCOUNTER — Other Ambulatory Visit: Payer: Self-pay

## 2024-04-25 MED ORDER — CLINDAMYCIN HCL 150 MG PO CAPS: 450.0000 mg | ORAL_CAPSULE | Freq: Three times a day (TID) | ORAL | 0 refills | Status: DC

## 2024-04-25 NOTE — Telephone Encounter (Signed)
 Pt called stating that she was seen in clinic by Dr. Lanny on 04/05/2024.  Pt stated at the time she had a boil on her lower abdomen that Dr. Lanny assessed while pt was in clinic.  Pt stated Dr. Lanny prescribed Abx which the pt has completed. Pt stated the boil has not healed and is still open plus draining bloody to yellow color pus.  Pt stated the boil is about the size of a quarter but doesn't appear to have anymore boils appearing anywhere else on her body.  Pt stated she's applying Nystatin  powder to the site and trying to keep the site dry but she's concerned because the site is draining more and the pus is yellow.  Pt wants to know is there anything else she can take or do to help the site heal.  Pt also wants to know if she can apply the Mupirocin  Ointment or some other ABX ointment to the site to help expedite the healing process,  Stated this nurse will make Dr. Lanny aware of the pt's call and will f/u on her response.

## 2024-04-26 ENCOUNTER — Telehealth: Payer: Self-pay

## 2024-04-26 DIAGNOSIS — A4902 Methicillin resistant Staphylococcus aureus infection, unspecified site: Secondary | ICD-10-CM

## 2024-04-26 DIAGNOSIS — C7931 Secondary malignant neoplasm of brain: Secondary | ICD-10-CM

## 2024-04-26 DIAGNOSIS — C3412 Malignant neoplasm of upper lobe, left bronchus or lung: Secondary | ICD-10-CM

## 2024-04-26 DIAGNOSIS — L03311 Cellulitis of abdominal wall: Secondary | ICD-10-CM

## 2024-04-26 NOTE — Telephone Encounter (Signed)
 Spoke with pt via telephone to inform pt that Dr. Lanny prescribed Clindamycin  450mg  TID x10days to be applied to the boil site.  Stated the prescription was sent on 04/25/2024 and should be available for pickup.  Also informed pt that Dr. Lanny has referred her to ID for recurrent skin MRSA disease.  Stated someone from [facility]'s ID Team will be contacting her.  Pt verbalized understanding and had no further questions or concerns at this time.

## 2024-04-28 ENCOUNTER — Other Ambulatory Visit: Payer: Self-pay

## 2024-05-03 ENCOUNTER — Other Ambulatory Visit (HOSPITAL_COMMUNITY): Payer: Self-pay

## 2024-05-03 ENCOUNTER — Other Ambulatory Visit: Payer: Self-pay

## 2024-05-03 ENCOUNTER — Encounter (INDEPENDENT_AMBULATORY_CARE_PROVIDER_SITE_OTHER): Payer: Self-pay

## 2024-05-03 NOTE — Progress Notes (Signed)
 Specialty Pharmacy Refill Coordination Note  MyChart Questionnaire Submission  Abigail Golden is a 58 y.o. female contacted today regarding refills of specialty medication(s) Tagrisso .  Doses on hand: (Patient-Rptd) 10   Patient requested: (Patient-Rptd) Delivery   Delivery date: 05/05/24  Verified address: [address] [place] [address]  Medication will be filled on 05/04/24.

## 2024-05-03 NOTE — Progress Notes (Signed)
 Clinical Intervention Note  Clinical Intervention Notes: Patient reports initiating Clindamycin  for infection. No DDIs identified with Tagrisso .   Clinical Intervention Outcomes: Prevention of an adverse drug event   Abigail Golden Specialty Pharmacist

## 2024-05-04 ENCOUNTER — Ambulatory Visit: Admitting: Internal Medicine

## 2024-05-04 ENCOUNTER — Other Ambulatory Visit: Payer: Self-pay

## 2024-05-04 ENCOUNTER — Encounter: Payer: Self-pay | Admitting: Internal Medicine

## 2024-05-04 VITALS — BP 120/80 | HR 79 | Ht 61.0 in | Wt 248.0 lb

## 2024-05-04 DIAGNOSIS — L03311 Cellulitis of abdominal wall: Secondary | ICD-10-CM | POA: Diagnosis not present

## 2024-05-04 MED ORDER — SULFAMETHOXAZOLE-TRIMETHOPRIM 800-160 MG PO TABS: 1.0000 | ORAL_TABLET | Freq: Two times a day (BID) | ORAL | 1 refills | Status: DC

## 2024-05-04 NOTE — Patient Instructions (Signed)
-  Stop clindamycin  -start bactrim  1ds bid x 7 days -If wound worsens or new lesion appears concerning for skin infection please go to ED for [facility] infusion -F/U with ID PRN

## 2024-05-04 NOTE — Progress Notes (Signed)
 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) 05/03/2021   Lung mass 04/24/2021   Malignant neoplasm metastatic to brain [facility]) 04/23/2021   Chronic intractable headache 09/09/2018   Chronic migraine without aura without status migrainosus, not intractable 09/09/2018   Morbid obesity with BMI of 45.0-49.9, adult (HCC) 09/09/2018   Medication overuse headache 09/09/2018   Cholecystitis 05/25/2015    Patient's Medications  New Prescriptions   No medications on file  Previous Medications   ACETAMINOPHEN  (TYLENOL ) 500 MG TABLET    Take 500 mg by mouth every 6 (six) hours as needed for moderate pain or headache.   ALBUTEROL  (VENTOLIN  HFA) 108 (90 BASE) MCG/ACT INHALER    Inhale 2 puffs into the lungs every 4 (four) hours as needed.   ALPRAZOLAM  (XANAX ) 0.25 MG TABLET    Take 0.25 mg by mouth every 4 (four) hours as needed for anxiety. for anxiety   CITALOPRAM  (CELEXA ) 10 MG TABLET    Take 10 mg by mouth at bedtime.   CLINDAMYCIN  (CLEOCIN ) 150 MG CAPSULE    Take 3 capsules (450 mg total) by mouth 3 (three) times daily.   DICLOFENAC POTASSIUM,MIGRAINE, 50 MG PACK    Take 1 each by mouth as needed. As needed for migraines   DICLOFENAC SODIUM (VOLTAREN) 1 % GEL    Apply topically as needed. To affected area   DIPHENHYDRAMINE  (BENADRYL ) 25 MG TABLET    Take 25 mg by mouth See admin instructions. 25 mg at bedtime 25 mg as needed for itching   DOCUSATE SODIUM  (STOOL SOFTENER LAXATIVE PO)    Take 1 tablet by mouth as needed.   ELETRIPTAN (RELPAX) 20 MG TABLET    Take 20 mg by mouth as  needed for headache (at onset of migraine.). May repeat in 2 hours if needed.   FLUTICASONE (FLONASE) 50 MCG/ACT NASAL SPRAY    Place 2 sprays into both nostrils daily as needed for allergies.   HYDROCODONE  BIT-HOMATROPINE (HYCODAN) 5-1.5 MG/5ML SYRUP    Take 5 mLs by mouth every 6 (six) hours as needed for cough (Cough not relieved with Tessalon  Perles).   HYDROXYZINE  (ATARAX /VISTARIL ) 25 MG TABLET    Take 25 mg by mouth See admin instructions. 25 mg at bedtime 25 mg during the day as needed for itching/anxiety   LORATADINE  (CLARITIN ) 10 MG TABLET    Take 10 mg by mouth daily.   MONTELUKAST  (SINGULAIR ) 10 MG TABLET    Take 10 mg by mouth at bedtime.   MUPIROCIN  OINTMENT (BACTROBAN ) 2 %    Apply 1 Application topically 3 (three) times daily.   NYSTATIN  POWDER    APPLY  POWDER TOPICALLY TO AFFECTED AREA THREE TIMES DAILY   ONDANSETRON  (ZOFRAN -ODT) 4 MG DISINTEGRATING TABLET    DISSOLVE 1 TABLET IN MOUTH EVERY 8 HOURS AS NEEDED FOR NAUSEA AND VOMITING   OSIMERTINIB  MESYLATE (TAGRISSO ) 80 MG TABLET    Take 1 tablet (80 mg total) by mouth daily.   PROCHLORPERAZINE  (COMPAZINE ) 10 MG TABLET    TAKE 1 TABLET BY MOUTH EVERY 6 HOURS AS NEEDED FOR  NAUSEA FOR VOMITING   SOLIFENACIN (VESICARE) 10 MG TABLET    Take 10 mg by mouth every evening.   SULFAMETHOXAZOLE -TRIMETHOPRIM  (BACTRIM  DS) 800-160 MG TABLET    Take 1 tablet by mouth 2 (two) times daily.   TRAMADOL  (ULTRAM ) 50 MG TABLET    Take 1 tablet (50 mg total) by mouth every 6 (six) hours as needed.   TRIAMCINOLONE CREAM (KENALOG) 0.1 %    Apply 1 Application topically 2 (two) times daily.   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  Abdominal wall cellulitis requiring hospitalization 8/27-29 treated with IV vancomycin  then transition to linezolid  to complete 2-week course.  She also had COVID infection at that point.  CT abdomen pelvis during that visit just showed some abdominal wall edema.  She was given MRSA coverage as wound culture through PCP office grew MRSA.  She is seen by oncology in the interim she was prescribed Bactrim  x 7 days October 14, then clindamycin . Today 05/04/24 : Discussed the use of AI scribe software for clinical note transcription with the patient, who gave verbal consent to proceed. In addition to above Hx: She developed a new skin lesion on her right hip on April 03, 2024. Initially, she was treated with Bactrim  for seven days without improvement, leading to a switch to clindamycin . Dressing changes with nonstick gauze have helped reduce drainage and bleeding. The lesion resembles a blister that opened up, similar to a previous MRSA infection in August.  Her medical history includes a hospitalization for abdominal wall cellulitis treated with doxycycline and vancomycin . She experienced a MRSA infection in August, treated with Bactrim  and clindamycin . Additionally, she was hospitalized twice in April for a Vibrio species infection, which caused severe diarrhea.  She has a history of cancer with recent radiation treatment to the brain for a new lesion detected on MRI. She is currently on a chemotherapy pill.  Her current medications include clinda.  Review of Systems: Review of Systems  All other systems reviewed and are negative.   Past Medical History:  Diagnosis Date   Allergy    Anxiety    Chronic bronchitis (HCC)    Depression    Headache    Hypoglycemia    Lower back pain    since fall ~ 2012 (05/25/2015)   Migraine    05/25/2015 probably once/month    Migraine headache    Pain in left hip    since fall ~ 2012 (05/25/2015)   Pneumonia several times    Social History   Tobacco Use   Smoking status: Never   Smokeless tobacco: Never  Vaping Use   Vaping status: Never Used  Substance Use Topics   Alcohol use: Never   Drug use: Never    Family History  Problem Relation Age of Onset   Heart disease Mother    Kassie Pour White syndrome Father    Kassie Parkinson White syndrome Paternal Grandmother    Diabetes Paternal Grandmother    Stomach cancer Paternal Grandmother    Breast cancer Neg  Hx     Allergies  Allergen Reactions   Celebrex [Celecoxib] Itching and Rash    Facial itching   Minocycline Itching and Rash    Facial itching     Health Maintenance  Topic Date Due   Medicare Annual Wellness (AWV)  Never done   Hepatitis C Screening  Never done   DTaP/Tdap/Td (1 - Tdap) Never done   Pneumococcal Vaccine: 50+ Years (1 of 2 - PCV) Never done   Hepatitis B Vaccines 19-59 Average Risk (1 of 3 - 19+ 3-dose series) Never done   Zoster Vaccines- Shingrix (1 of 2) Never done   Colonoscopy  Never done   Cervical Cancer Screening (HPV/Pap Cotest)  10/10/2012   COVID-19 Vaccine (5 - 2025-26 season) 02/22/2024   Mammogram  08/10/2025   Influenza Vaccine  Completed   HIV Screening  Completed   HPV VACCINES  Aged Out   Meningococcal B Vaccine  Aged Out    Objective:  There were no vitals filed for this visit. There is no height or weight on file to calculate BMI.  Physical Exam Constitutional:      Appearance: Normal appearance.  HENT:     Head: Normocephalic and atraumatic.     Right Ear: Tympanic membrane normal.     Left Ear: Tympanic membrane normal.     Nose: Nose normal.     Mouth/Throat:     Mouth: Mucous membranes are moist.  Eyes:     Extraocular Movements: Extraocular movements intact.     Conjunctiva/sclera: Conjunctivae normal.     Pupils: Pupils are equal, round, and reactive to light.   Cardiovascular:     Rate and Rhythm: Normal rate and regular rhythm.     Heart sounds: No murmur heard.    No friction rub. No gallop.  Pulmonary:     Effort: Pulmonary effort is normal.     Breath sounds: Normal breath sounds.  Abdominal:     General: Abdomen is flat.     Palpations: Abdomen is soft.  Musculoskeletal:        General: Normal range of motion.  Skin:    General: Skin is warm and dry.  Neurological:     General: No focal deficit present.     Mental Status: She is alert and oriented to person, place, and time.  Psychiatric:        Mood and Affect: Mood normal.    Lab Results Lab Results  Component Value Date   WBC 2.5 (L) 04/05/2024   HGB 11.0 (L) 04/05/2024   HCT 33.2 (L) 04/05/2024   MCV 86.9 04/05/2024   PLT 180 04/05/2024    Lab Results  Component Value Date   CREATININE 1.16 (H) 04/05/2024   BUN 17 04/05/2024   NA 137 04/05/2024   K 4.7 04/05/2024   CL 105 04/05/2024   CO2 28 04/05/2024    Lab Results  Component Value Date   ALT 12 04/05/2024   AST 18 04/05/2024   ALKPHOS 78 04/05/2024   BILITOT 0.5 04/05/2024    No results found for: CHOL, HDL, LDLCALC, LDLDIRECT, TRIG, CHOLHDL No results found for: LABRPR, RPRTITER No results found for: HIV1RNAQUANT, HIV1RNAVL, CD4TABS   Problem List Items Addressed This Visit   None  Results   Assessment/Plan 58 year old female with history of stage IV lung cancer with mets to brain presents for management of abdominal wall cellulitis - She has  been on Bactrim  in the past. Currently, on clindamycin  for abdominal  cellulitis.  Had a hospitalization at the end of August  for abdominal wall cellulitis, CT showed only abdominal wall edema.  No abscess was seen.  She was treated with IV vancomycin  during hospitalization given linezolid  to complete antibiotic course. - Avoid linezolid  at patient is on citalopram  - Reviewed wound cultures MRSA sensitive to doxycycline and  Bactrim . Plan: -Stop clindamycin  -start bactrim  1ds bid x 7 days -If wound worsens or new lesion appears concerning for skin infection please go to ED for dalvance infusion -F/U with ID PRN Loney Stank, MD [facility] 05/04/2024, 1:54 PM   I personally spent a total of 85 minutes in the care of the patient today including preparing to see the patient, getting/reviewing separately obtained history, performing a medically appropriate exam/evaluation, counseling and educating, placing orders, documenting clinical information in the EHR, independently interpreting results, and communicating results.

## 2024-05-05 ENCOUNTER — Telehealth: Payer: Self-pay

## 2024-05-05 NOTE — Telephone Encounter (Signed)
 Patient advised and verbalized understanding

## 2024-05-05 NOTE — Telephone Encounter (Signed)
 Given bactrim  intolerance at this point, and fever. Recommend going to ED for dalvance infusion as discussed in clinic.

## 2024-05-05 NOTE — Telephone Encounter (Signed)
 Patient called stated she started the Bactrim  yesterday evening and began having nausea, lightheaded, tingling in her back and shoulders. She reports EMS was called for her last night at church but EKG and vitals were fine. She reports having fevers this morning but none as of this afternoon. Tylenol  taken for the fevers.  She was sure if this was due to the change in abx. Please advise.  Abigail Golden Abigail Golden, CMA

## 2024-05-09 NOTE — Telephone Encounter (Signed)
 Patient called requesting to discuss Dalvance infusion and possible side effects. Patient did not go to the ED. Patient also complained of sores in mouth and feels like it is due to the bactrim , but patient stopped bactrim  last week. Patient denies any other symptoms.  Scheduled for virtual appointment

## 2024-05-10 ENCOUNTER — Telehealth (INDEPENDENT_AMBULATORY_CARE_PROVIDER_SITE_OTHER): Admitting: Internal Medicine

## 2024-05-10 DIAGNOSIS — L03311 Cellulitis of abdominal wall: Secondary | ICD-10-CM

## 2024-05-10 MED ORDER — LIDOCAINE VISCOUS HCL 2 % MT SOLN: 5.0000 mL | Freq: Three times a day (TID) | OROMUCOSAL | 2 refills | Status: AC | PRN

## 2024-05-10 NOTE — Progress Notes (Signed)
 Virtual Visit via Telephone/Video Note   I connected with Abigail Golden   On 05/15/2024 at 3:54 PM  by Video and verified that I am speaking with the correct person using two identifiers.   I discussed the limitations, risks, security and privacy concerns of performing an evaluation and management service by telephone and the availability of in person appointments. I also discussed with the patient that there may be a patient responsible charge related to this service. The patient expressed understanding and agreed to proceed.   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   ALBUTEROL  (VENTOLIN  HFA) 108 (90 BASE) MCG/ACT INHALER    Inhale 2 puffs into the lungs every 4 (four) hours as needed.   ALPRAZOLAM  (XANAX ) 0.25 MG TABLET    Take 0.25 mg by mouth every 4 (four) hours as needed for anxiety. for anxiety   CITALOPRAM  (CELEXA ) 10 MG TABLET    Take 10 mg by mouth at  bedtime.   DICLOFENAC POTASSIUM,MIGRAINE, 50 MG PACK    Take 1 each by mouth as needed. As needed for migraines   DICLOFENAC SODIUM (VOLTAREN) 1 % GEL    Apply topically as needed. To affected area   DIPHENHYDRAMINE  (BENADRYL ) 25 MG TABLET    Take 25 mg by mouth See admin instructions. 25 mg at bedtime 25 mg as needed for itching   DOCUSATE SODIUM  (STOOL SOFTENER LAXATIVE PO)    Take 1 tablet by mouth as needed.   ELETRIPTAN (RELPAX) 20 MG TABLET    Take 20 mg by mouth as needed for headache (at onset of migraine.). May repeat in 2 hours if needed.   FLUTICASONE (FLONASE) 50 MCG/ACT NASAL SPRAY    Place 2 sprays into both nostrils daily as needed for allergies.   HYDROCODONE  BIT-HOMATROPINE (HYCODAN) 5-1.5 MG/5ML SYRUP    Take 5 mLs by mouth every 6 (six) hours as needed for cough (Cough not relieved with Tessalon  Perles).   HYDROXYZINE  (ATARAX /VISTARIL ) 25 MG TABLET    Take 25 mg by mouth See admin instructions. 25 mg at bedtime 25 mg during the day as needed for itching/anxiety   LORATADINE  (CLARITIN ) 10 MG TABLET    Take 10 mg by mouth daily.   MONTELUKAST  (SINGULAIR ) 10 MG TABLET    Take 10 mg by mouth at bedtime.   MUPIROCIN  OINTMENT (BACTROBAN ) 2 %    Apply 1 Application topically 3 (three) times daily.   NYSTATIN  POWDER    APPLY  POWDER TOPICALLY  TO AFFECTED AREA THREE TIMES DAILY   ONDANSETRON  (ZOFRAN -ODT) 4 MG DISINTEGRATING TABLET    DISSOLVE 1 TABLET IN MOUTH EVERY 8 HOURS AS NEEDED FOR NAUSEA AND VOMITING   OSIMERTINIB  MESYLATE (TAGRISSO ) 80 MG TABLET    Take 1 tablet (80 mg total) by mouth daily.   PROCHLORPERAZINE  (COMPAZINE ) 10 MG TABLET    TAKE 1 TABLET BY MOUTH EVERY 6 HOURS AS NEEDED FOR NAUSEA FOR VOMITING   SOLIFENACIN (VESICARE) 10 MG TABLET    Take 10 mg by mouth every evening.   SULFAMETHOXAZOLE -TRIMETHOPRIM  (BACTRIM  DS) 800-160 MG TABLET    Take 1 tablet by mouth 2 (two) times daily.   TRAMADOL  (ULTRAM ) 50 MG TABLET    Take 1 tablet (50 mg total) by mouth every 6 (six)  hours as needed.   TRIAMCINOLONE CREAM (KENALOG) 0.1 %    Apply 1 Application topically 2 (two) times daily.   ZOLPIDEM  (AMBIEN ) 10 MG TABLET    TAKE 1/2 TO 1 (ONE-HALF TO ONE) TABLET BY MOUTH AT BEDTIME AS NEEDED FOR SLEEP  Modified Medications   No medications on file  Discontinued Medications   No medications on file    Subjective: 58 year old female with past medical history of CKD stage III and stage IIIb, asthma, anxiety/depression, stage IV small cell lung cancer with mets to brain on oral chemotherapy, SBO, history of cholecystectomy and hysterectomy, presents for management of cellulitis.  Abdominal wall cellulitis requiring hospitalization 8/27-29 treated with IV vancomycin  then transition to linezolid  to complete 2-week course.  She also had COVID infection at that point.  CT abdomen pelvis during that visit just showed some abdominal wall edema.  She was given MRSA coverage as wound culture through PCP office grew MRSA.  She is seen by oncology in the interim she was prescribed Bactrim  x 7 days October 14, then clindamycin . Today 05/04/24 : Discussed the use of AI scribe software for clinical note transcription with the patient, who gave verbal consent to proceed. In addition to above Hx: She developed a new skin lesion on her right hip on April 03, 2024. Initially, she was treated with Bactrim  for seven days without improvement, leading to a switch to clindamycin . Dressing changes with nonstick gauze have helped reduce drainage and bleeding. The lesion resembles a blister that opened up, similar to a previous MRSA infection in August.   Her medical history includes a hospitalization for abdominal wall cellulitis treated with doxycycline and vancomycin . She experienced a MRSA infection in August, treated with Bactrim  and clindamycin . Additionally, she was hospitalized twice in April for a Vibrio species infection, which caused severe diarrhea.   She has a history of cancer with recent  radiation treatment to the brain for a new lesion detected on MRI. She is currently on a chemotherapy pill.   Her current medications include clinda.  Today: reprots she took bactrim (tolerated in th past)   Review of Systems: ROS  Past Medical History:  Diagnosis Date   Allergy    Anxiety    Chronic bronchitis (HCC)    Depression    Headache    Hypoglycemia    Lower back pain    since fall ~ 2012 (05/25/2015)   Migraine    05/25/2015 probably once/month   Migraine headache    Pain in left hip    since fall ~ 2012 (05/25/2015)   Pneumonia several times    Social History   Tobacco Use   Smoking status: Never   Smokeless tobacco: Never  Vaping  Use   Vaping status: Never Used  Substance Use Topics   Alcohol use: Never   Drug use: Never    Family History  Problem Relation Age of Onset   Heart disease Mother    Kassie Pour White syndrome Father    Kassie Parkinson White syndrome Paternal Grandmother    Diabetes Paternal Grandmother    Stomach cancer Paternal Grandmother    Breast cancer Neg Hx     Allergies  Allergen Reactions   Celebrex [Celecoxib] Itching and Rash    Facial itching   Minocycline Itching and Rash    Facial itching     Health Maintenance  Topic Date Due   Medicare Annual Wellness (AWV)  Never done   Hepatitis C Screening  Never done   DTaP/Tdap/Td (1 - Tdap) Never done   Pneumococcal Vaccine: 50+ Years (1 of 2 - PCV) Never done   Hepatitis B Vaccines 19-59 Average Risk (1 of 3 - 19+ 3-dose series) Never done   Zoster Vaccines- Shingrix (1 of 2) Never done   Colonoscopy  Never done   Cervical Cancer Screening (HPV/Pap Cotest)  10/10/2012   COVID-19 Vaccine (5 - 2025-26 season) 02/22/2024   Mammogram  08/10/2025   Influenza Vaccine  Completed   HIV Screening  Completed   HPV VACCINES  Aged Out   Meningococcal B Vaccine  Aged Out    Objective:  There were no vitals filed for this visit. There is no height or weight on file to  calculate BMI.   Lab Results Lab Results  Component Value Date   WBC 2.5 (L) 04/05/2024   HGB 11.0 (L) 04/05/2024   HCT 33.2 (L) 04/05/2024   MCV 86.9 04/05/2024   PLT 180 04/05/2024    Lab Results  Component Value Date   CREATININE 1.16 (H) 04/05/2024   BUN 17 04/05/2024   NA 137 04/05/2024   K 4.7 04/05/2024   CL 105 04/05/2024   CO2 28 04/05/2024    Lab Results  Component Value Date   ALT 12 04/05/2024   AST 18 04/05/2024   ALKPHOS 78 04/05/2024   BILITOT 0.5 04/05/2024    No results found for: CHOL, HDL, LDLCALC, LDLDIRECT, TRIG, CHOLHDL No results found for: LABRPR, RPRTITER No results found for: HIV1RNAQUANT, HIV1RNAVL, CD4TABS   Problem List Items Addressed This Visit   None  Results   Assessment/Plan #Abdominal wall cellulitis->improved EMS was called with she had initial reaction after an hour of bactrim (flushing +/1 rash, tingling) but did not go to ED. She was at church . Tingling after an hour of bactrim . But then took it Thursday and Friday.  #thrush -States she had thrush, not able to appreciate on video. Ok with magic mouthwash -Reprots abdomianl cellulitis better -Re-counsled on  going to ED for Dalvance if cellulitis returns f/u with ID prn   #Bactrim  allergy -Discussed she has tolerated Ab xin the past. Could be another etiology for symptoms. She would like bactrim  added to allergy list      Loney Stank, MD [facility] 05/10/2024, 9:43 AM   I personally spent a total of 45 minutes in the care of the patient today including preparing to see the patient, getting/reviewing separately obtained history, performing a medically appropriate exam/evaluation, counseling and educating, placing orders, documenting clinical information in the EHR, independently interpreting results, and communicating results.

## 2024-05-10 NOTE — Progress Notes (Signed)
  Radiation Oncology         [phone] ________________________________  Name: Abigail Golden MRN: [id number]  Date of Service: 05/23/2024  DOB: [birth year]  Post Treatment Telephone Note  Diagnosis:  Secondary malignant neoplasm of brain   First Treatment Date: 2024-04-19 Last Treatment Date: 2024-04-19   Plan Name: Brain_SRS Site: Brain Technique: 3D Mode: Photon Dose Per Fraction: 20 Gy Prescribed Dose (Delivered / Prescribed): 20 Gy / 20 Gy Prescribed Fxs (Delivered / Prescribed): 1 / 1     The patient was available for call today.  The patient did note fatigue during radiation for about a day or two then it improved. The patient did not note skin changes in the field of radiation during therapy.    The patient is scheduled for CT scans every 3-6 months for ongoing surveillance. She was encouraged to call if  she has not received a call to schedule CT imaging, or if she develops concerns or questions regarding radiation.

## 2024-05-23 ENCOUNTER — Ambulatory Visit
Admission: RE | Admit: 2024-05-23 | Discharge: 2024-05-23 | Disposition: A | Source: Ambulatory Visit | Attending: Radiation Oncology

## 2024-05-23 DIAGNOSIS — C3490 Malignant neoplasm of unspecified part of unspecified bronchus or lung: Secondary | ICD-10-CM

## 2024-05-27 ENCOUNTER — Other Ambulatory Visit: Payer: Self-pay

## 2024-05-31 ENCOUNTER — Inpatient Hospital Stay: Attending: Hematology

## 2024-05-31 ENCOUNTER — Encounter (HOSPITAL_COMMUNITY)
Admission: RE | Admit: 2024-05-31 | Discharge: 2024-05-31 | Disposition: A | Source: Ambulatory Visit | Attending: Hematology

## 2024-05-31 ENCOUNTER — Other Ambulatory Visit: Payer: Self-pay

## 2024-05-31 DIAGNOSIS — D701 Agranulocytosis secondary to cancer chemotherapy: Secondary | ICD-10-CM | POA: Diagnosis not present

## 2024-05-31 DIAGNOSIS — T451X5A Adverse effect of antineoplastic and immunosuppressive drugs, initial encounter: Secondary | ICD-10-CM | POA: Insufficient documentation

## 2024-05-31 DIAGNOSIS — C7931 Secondary malignant neoplasm of brain: Secondary | ICD-10-CM | POA: Insufficient documentation

## 2024-05-31 DIAGNOSIS — C3412 Malignant neoplasm of upper lobe, left bronchus or lung: Secondary | ICD-10-CM | POA: Insufficient documentation

## 2024-05-31 DIAGNOSIS — Z79899 Other long term (current) drug therapy: Secondary | ICD-10-CM | POA: Insufficient documentation

## 2024-05-31 DIAGNOSIS — F418 Other specified anxiety disorders: Secondary | ICD-10-CM | POA: Diagnosis not present

## 2024-05-31 DIAGNOSIS — Z90710 Acquired absence of both cervix and uterus: Secondary | ICD-10-CM | POA: Insufficient documentation

## 2024-05-31 DIAGNOSIS — Z923 Personal history of irradiation: Secondary | ICD-10-CM | POA: Diagnosis not present

## 2024-05-31 LAB — CMP (CANCER CENTER ONLY)
ALT: 15 U/L (ref 0–44)
AST: 23 U/L (ref 15–41)
Albumin: 4.3 g/dL (ref 3.5–5.0)
Alkaline Phosphatase: 72 U/L (ref 38–126)
Anion gap: 11 (ref 5–15)
BUN: 20 mg/dL (ref 6–20)
CO2: 23 mmol/L (ref 22–32)
Calcium: 9.4 mg/dL (ref 8.9–10.3)
Chloride: 105 mmol/L (ref 98–111)
Creatinine: 1.18 mg/dL — ABNORMAL HIGH (ref 0.44–1.00)
GFR, Estimated: 54 mL/min — ABNORMAL LOW (ref >60)
Glucose, Bld: 90 mg/dL (ref 70–99)
Potassium: 4.1 mmol/L (ref 3.5–5.1)
Sodium: 138 mmol/L (ref 135–145)
Total Bilirubin: 0.4 mg/dL (ref 0.0–1.2)
Total Protein: 7 g/dL (ref 6.5–8.1)

## 2024-05-31 LAB — CBC WITH DIFFERENTIAL (CANCER CENTER ONLY)
Abs Immature Granulocytes: 0 K/uL (ref 0.00–0.07)
Basophils Absolute: 0 K/uL (ref 0.0–0.1)
Basophils Relative: 0 %
Eosinophils Absolute: 0 K/uL (ref 0.0–0.5)
Eosinophils Relative: 0 %
HCT: 34.9 % — ABNORMAL LOW (ref 36.0–46.0)
Hemoglobin: 11.7 g/dL — ABNORMAL LOW (ref 12.0–15.0)
Immature Granulocytes: 0 %
Lymphocytes Relative: 35 %
Lymphs Abs: 1.1 K/uL (ref 0.7–4.0)
MCH: 29 pg (ref 26.0–34.0)
MCHC: 33.5 g/dL (ref 30.0–36.0)
MCV: 86.4 fL (ref 80.0–100.0)
Monocytes Absolute: 0.3 K/uL (ref 0.1–1.0)
Monocytes Relative: 10 %
Neutro Abs: 1.6 K/uL — ABNORMAL LOW (ref 1.7–7.7)
Neutrophils Relative %: 55 %
Platelet Count: 154 K/uL (ref 150–400)
RBC: 4.04 MIL/uL (ref 3.87–5.11)
RDW: 13.1 % (ref 11.5–15.5)
WBC Count: 3 K/uL — ABNORMAL LOW (ref 4.0–10.5)
nRBC: 0 % (ref 0.0–0.2)

## 2024-05-31 LAB — GLUCOSE, CAPILLARY: Glucose-Capillary: 93 mg/dL (ref 70–99)

## 2024-05-31 MED ORDER — FLUDEOXYGLUCOSE F - 18 (FDG) INJECTION
10.0000 | Freq: Once | INTRAVENOUS | Status: AC
  Administered 2024-05-31: 12.34 via INTRAVENOUS

## 2024-06-01 ENCOUNTER — Other Ambulatory Visit: Payer: Self-pay

## 2024-06-01 ENCOUNTER — Other Ambulatory Visit: Payer: Self-pay | Admitting: Hematology

## 2024-06-01 NOTE — Progress Notes (Signed)
 Specialty Pharmacy Refill Coordination Note  Abigail Golden is a 58 y.o. female contacted today regarding refills of specialty medication(s) Osimertinib  Mesylate (TAGRISSO )   Patient requested [name] at [facility] at [place] date: 06/08/24   Medication will be filled on: 06/07/24

## 2024-06-02 ENCOUNTER — Other Ambulatory Visit: Payer: Self-pay

## 2024-06-07 ENCOUNTER — Inpatient Hospital Stay: Admitting: Hematology

## 2024-06-07 ENCOUNTER — Other Ambulatory Visit: Payer: Self-pay

## 2024-06-08 ENCOUNTER — Inpatient Hospital Stay: Admitting: Hematology

## 2024-06-08 VITALS — BP 124/60 | HR 83 | Temp 98.0°F | Resp 17 | Wt 250.9 lb

## 2024-06-08 DIAGNOSIS — C3412 Malignant neoplasm of upper lobe, left bronchus or lung: Secondary | ICD-10-CM

## 2024-06-08 MED ORDER — ONDANSETRON HCL 8 MG PO TABS: 8.0000 mg | ORAL_TABLET | Freq: Three times a day (TID) | ORAL | 1 refills | Status: AC | PRN

## 2024-06-08 MED ORDER — ONDANSETRON 4 MG PO TBDP: ORAL_TABLET | ORAL | 1 refills | Status: AC

## 2024-06-08 NOTE — Progress Notes (Signed)
 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 She tolerates moderately well with mucositis and skin toxicities.   -she had thoracic radiation (The primary tumor and involved mediastinal adenopathy were treated to 66 Gy in 33 fractions of 2 Gy), completed on 11/04/2021 - her recent restaging CT scan from May 22, 2022 showed continued response in the primary site and thoracic adenopathy, or new lesions. -She is clinically doing well, lives independently, tolerating Tagrisso  well, will continue. - restaging CT from 03/19/2023 showed post radiation change in lung and no evidence of recurrence. Will continue Tagrisso   -she we will continue follow-up with his radiation oncologist Dr. Patrcia for her brain metastasis, as single fraction SRS was recommended based on her recent brain MRI from March 18, 2023 -restaging CT CAP 06/22/2023 showed stable diease, no progression  -PET 10/16/2023,  01/26/2024 and 05/31/24 showed SD  Assessment & Plan Primary adenocarcinoma of upper lobe of left lung Non-small cell lung cancer, three years since diagnosis, on Tagrisso , status post 33 days of chest radiation. Maintained on Tagrisso  (osimertinib ) with near complete response. Recent PET scan shows no evidence of recurrence or metastasis, only residual scar tissue in the left upper lobe and stable paraspinal consolidation.  Brain imaging remains unchanged. She tolerates Tagrisso  well and is exceeding average outcomes for duration of therapy. - Continued Tagrisso  therapy. - Refilled Tagrisso  prescription. - Reviewed PET scan and imaging findings with her. - Scheduled next scan in 6 months  - Scheduled follow-up visit in three months.  Adverse reaction to Bactrim  Severe adverse reaction to Bactrim , including neurologic and cardiovascular symptoms, occurred after re-exposure. Symptoms resolved after discontinuation. She is now considered allergic to Bactrim . - Documented Bactrim  allergy in medical record. - Advised avoidance of Bactrim .  Leukopenia secondary to Tagrisso  Mild leukopenia attributed to Tagrisso , with recent laboratory results showing low white blood cell count, hemoglobin 11.7 g/dL, and normal platelet count. This is an expected side effect and not currently concerning. - Provided reassurance regarding current laboratory values.  Chronic kidney disease Chronic kidney disease with persistently elevated creatinine, likely multifactorial including effects of ongoing therapy. She understands the importance of hydration and is increasing oral intake. - Encouraged increased oral hydration. - Will monitor renal function.  Plan - She is clinically doing well overall, the skin infection in her abdominal wall has finally resolved. - Continue Tagrisso .  I reviewed her recent PET scan images, which showed stable radiation change in left upper lobe of lung, no evidence of disease progression. - Lab and follow-up in 3 months   SUMMARY OF ONCOLOGIC HISTORY: Oncology History Overview Note   Cancer Staging  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) Staging form: Lung, AJCC 8th Edition - Clinical stage from 04/24/2021: Stage IVA (cT2, cN2, pM1b) - Signed by [name], MD on 05/14/2021    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)  04/22/2021 Imaging   CT HEAD WO CONTRAST  IMPRESSION: Two separate areas of vasogenic edema within the left hemisphere, 1 within the inferior frontal region in the other at the left parietal vertex. Metastatic disease would be the most likely cause of this appearance. Other possibilities include venous infarctions and cerebritis. MRI with contrast recommended.    04/23/2021 Imaging   MR Brain W and Wo Contrast  IMPRESSION: 1. Metastatic disease to the brain. Two enhancing brain masses with moderate associated vasogenic edema: solid 16 mm left parietal lobe mass, and a larger 29 mm rim enhancing cystic or necrotic mass in the left inferior frontal gyrus. Two other small 2-3 mm metastases in the right inferior frontal gyrus, right posterior temporal lobe. And questionable two additional punctate metastases in both superior frontal gyri abutting the falx (series 18, image 46). 2. No significant intracranial mass effect. No other intracranial abnormality.   04/23/2021 Imaging   CT Chest W Contrast  IMPRESSION: 4.5 cm spiculated left upper lobe mass most compatible with primary lung cancer. This abuts the medial pleural surface. A few small adjacent pre-vascular mediastinal lymph nodes, none pathologically enlarged. Recommend cardiothoracic surgical and oncologic consultation.   No acute findings or evidence of metastatic disease in the abdomen or pelvis.   04/24/2021 Cancer Staging   Staging form: Lung, AJCC 8th Edition - Clinical stage from 04/24/2021: Stage IVA (cT2, cN2, pM1b) - Signed by [name], MD on 05/14/2021   04/24/2021 Initial Biopsy   FINAL MICROSCOPIC DIAGNOSIS:   A. LUNG, LUL, FINE NEEDLE ASPIRATION:  - Malignant cells present, consistent with adenocarcinoma   B. LUNG, LUL, BRUSHING:  - Malignant cells consistent with adenocarcinoma, see comment    COMMENT:  B.  Immunohistochemical stains show that the tumor cells are positive for TTF-1 while they are negative for p63 and CK5/6, consistent with above  interpretation. The number of tumor cells appears to be insufficient for molecular studies.   05/03/2021 Initial Diagnosis   Lung cancer (HCC)   05/08/2021 - 05/13/2021 Radiation Therapy   SRS treatment to the metastatic brain lesions under the care of Dr. Patrcia    08/05/2021 Imaging   EXAM: CT CHEST, ABDOMEN, AND PELVIS WITH CONTRAST  IMPRESSION: 1. No substantial change in size of the left upper lobe pulmonary lesion. 2. Borderline to mildly enlarged adjacent prevascular/AP window lymph nodes have increased in size in the interval. This finding raises concern for disease progression. 3. Interval development of upper normal bilateral common iliac and left external iliac lymph nodes. Close attention on follow-up recommended. 4. No other evidence for metastatic disease in the abdomen or pelvis.   09/22/2022 Imaging    IMPRESSION: 1. Area of chronic postradiation mass-like fibrosis in the posteromedial aspect of the left upper lung, without definitive findings of locally recurrent disease or definite metastatic disease in the chest, abdomen or pelvis. 2. Mild atherosclerosis. 3. Additional incidental findings, as above.     03/19/2023 Imaging   CT chest,abdomen, and pelvis with contrast IMPRESSION: 1. Unchanged appearance of posterior paramedian left upper lobe with dense post treatment fibrosis and volume loss. 2. No evidence of recurrent or metastatic disease in the chest, abdomen, or pelvis. 3. Mildly coarse contour of the liver, suggestive of cirrhosis. Correlate with biochemical findings. 4. Status post cholecystectomy and hysterectomy.  Aortic Atherosclerosis (ICD10-       Discussed the use of AI scribe software for clinical note transcription with the patient, who gave verbal consent to proceed.  History of Present Illness Abigail Golden is a 58 year old female  with non-small cell lung cancer of the left upper lobe on osimertinib  who presents for  oncology follow-up and management of treatment-related complications.  She is three years from initial diagnosis of non-small cell lung cancer of the left upper lobe, status post 33 days of chest radiation, and remains on maintenance osimertinib . She missed four days of osimertinib  during treatment for a skin infection but has resumed therapy without new chest pain, dyspnea, or masses.  In October, she developed a non-healing skin lesion treated with Bactrim  and a second antibiotic without improvement. After reinitiating Bactrim  on November 12, she had a severe reaction with paresthesias, tremor, insomnia, chest heaviness, rash, and hypertensive crisis with blood pressure up to 280/100s. Emergency services evaluated her with a normal EKG. She declined hospital transport and was monitored overnight. Symptoms persisted for over a week after stopping Bactrim , which she now avoids. The lesion has healed without erythema, and she uses powder, gauze, and barrier protection to keep the area dry and reduce friction.  She has mild leukopenia and chronic kidney disease attributed to osimertinib . Recent labs showed leukopenia, hemoglobin 11.7, and normal platelets. She is increasing water  intake for elevated creatinine and denies fever, chills, or sweats. She continues usual pharmacies and requests refills for osimertinib , ondansetron  (ODT and tablet), and zolpidem , which she takes as a half tablet nightly for sleep.     All other systems were reviewed with the patient and are negative.  MEDICAL HISTORY:  Past Medical History:  Diagnosis Date   Allergy    Anxiety    Chronic bronchitis (HCC)    Depression    Headache    Hypoglycemia    Lower back pain    since fall ~ 2012 (05/25/2015)   Migraine    05/25/2015 probably once/month   Migraine headache    Pain in left hip    since fall ~ 2012 (05/25/2015)   Pneumonia several times    SURGICAL HISTORY: Past Surgical History:  Procedure Laterality  Date   ABDOMINAL HYSTERECTOMY  11/2002   found benign tumors   BRONCHIAL BIOPSY  04/24/2021   Procedure: BRONCHIAL BIOPSIES;  Surgeon: Brenna Adine CROME, DO;  Location: [facility];  Service: Pulmonary;;   BRONCHIAL BRUSHINGS  04/24/2021   Procedure: BRONCHIAL BRUSHINGS;  Surgeon: Brenna Adine CROME, DO;  Location: [facility];  Service: Pulmonary;;   BRONCHIAL NEEDLE ASPIRATION BIOPSY  04/24/2021   Procedure: BRONCHIAL NEEDLE ASPIRATION BIOPSIES;  Surgeon: Brenna Adine CROME, DO;  Location: [facility];  Service: Pulmonary;;   CHEST TUBE INSERTION  04/24/2021   Procedure: CHEST TUBE INSERTION;  Surgeon: Brenna Adine CROME, DO;  Location: [facility];  Service: Pulmonary;;   CHOLECYSTECTOMY N/A 05/25/2015   Procedure: LAPAROSCOPIC CHOLECYSTECTOMY;  Surgeon: Lynda Leos, MD;  Location: [facility] OR;  Service: General;  Laterality: N/A;   LAPAROSCOPIC CHOLECYSTECTOMY  05/25/2015   MENISCUS REPAIR Right 03/2017   VIDEO BRONCHOSCOPY WITH ENDOBRONCHIAL NAVIGATION N/A 04/24/2021   Procedure: VIDEO BRONCHOSCOPY WITH ENDOBRONCHIAL NAVIGATION;  Surgeon: Brenna Adine CROME, DO;  Location: [facility];  Service: Pulmonary;  Laterality: N/A;   VIDEO BRONCHOSCOPY WITH RADIAL ENDOBRONCHIAL ULTRASOUND  04/24/2021   Procedure: VIDEO BRONCHOSCOPY WITH RADIAL ENDOBRONCHIAL ULTRASOUND;  Surgeon: Brenna Adine CROME, DO;  Location: [facility];  Service: Pulmonary;;    I have reviewed the social history and family history with the patient and they are unchanged from previous note.  ALLERGIES:  is allergic to bactrim  [sulfamethoxazole -trimethoprim ], celebrex [celecoxib], and minocycline.  MEDICATIONS:  Current Outpatient Medications  Medication Sig  Dispense Refill   acetaminophen  (TYLENOL ) 500 MG tablet Take 500 mg by mouth every 6 (six) hours as needed for moderate pain or headache.     albuterol  (VENTOLIN  HFA) 108 (90 Base) MCG/ACT inhaler Inhale 2 puffs into the lungs every 4 (four) hours as needed.     ALPRAZolam   (XANAX ) 0.25 MG tablet Take 0.25 mg by mouth every 4 (four) hours as needed for anxiety. for anxiety     citalopram  (CELEXA ) 10 MG tablet Take 10 mg by mouth at bedtime.     Diclofenac Potassium,Migraine, 50 MG PACK Take 1 each by mouth as needed. As needed for migraines     diclofenac sodium (VOLTAREN) 1 % GEL Apply topically as needed. To affected area     diphenhydrAMINE  (BENADRYL ) 25 MG tablet Take 25 mg by mouth See admin instructions. 25 mg at bedtime 25 mg as needed for itching     Docusate Sodium  (STOOL SOFTENER LAXATIVE PO) Take 1 tablet by mouth as needed.     eletriptan (RELPAX) 20 MG tablet Take 20 mg by mouth as needed for headache (at onset of migraine.). May repeat in 2 hours if needed.     fluticasone (FLONASE) 50 MCG/ACT nasal spray Place 2 sprays into both nostrils daily as needed for allergies.     HYDROcodone  bit-homatropine (HYCODAN) 5-1.5 MG/5ML syrup Take 5 mLs by mouth every 6 (six) hours as needed for cough (Cough not relieved with Tessalon  Perles). 120 mL 0   hydrOXYzine  (ATARAX /VISTARIL ) 25 MG tablet Take 25 mg by mouth See admin instructions. 25 mg at bedtime 25 mg during the day as needed for itching/anxiety     loratadine  (CLARITIN ) 10 MG tablet Take 10 mg by mouth daily.     magic mouthwash (lidocaine , diphenhydrAMINE , alum & mag hydroxide) suspension Swish and swallow 5 mLs 3 (three) times daily as needed for mouth pain. 360 mL 2   montelukast  (SINGULAIR ) 10 MG tablet Take 10 mg by mouth at bedtime.     mupirocin  ointment (BACTROBAN ) 2 % Apply 1 Application topically 3 (three) times daily.     nystatin  powder APPLY  POWDER TOPICALLY TO AFFECTED AREA THREE TIMES DAILY 60 g 5   ondansetron  (ZOFRAN ) 8 MG tablet Take 1 tablet (8 mg total) by mouth every 8 (eight) hours as needed for nausea or vomiting. 20 tablet 1   osimertinib  mesylate (TAGRISSO ) 80 MG tablet Take 1 tablet (80 mg total) by mouth daily. 30 tablet 3   prochlorperazine  (COMPAZINE ) 10 MG tablet TAKE 1  TABLET BY MOUTH EVERY 6 HOURS AS NEEDED FOR NAUSEA FOR VOMITING 30 tablet 1   solifenacin (VESICARE) 10 MG tablet Take 10 mg by mouth every evening.     traMADol  (ULTRAM ) 50 MG tablet Take 1 tablet (50 mg total) by mouth every 6 (six) hours as needed. 60 tablet 1   triamcinolone cream (KENALOG) 0.1 % Apply 1 Application topically 2 (two) times daily.     zolpidem  (AMBIEN ) 10 MG tablet TAKE 1/2 TO 1 (ONE-HALF TO ONE) TABLET BY MOUTH AT BEDTIME AS NEEDED FOR SLEEP 30 tablet 0   ondansetron  (ZOFRAN -ODT) 4 MG disintegrating tablet DISSOLVE 1 TABLET IN MOUTH EVERY 8 HOURS AS NEEDED FOR NAUSEA AND VOMITING 18 tablet 1   sulfamethoxazole -trimethoprim  (BACTRIM  DS) 800-160 MG tablet Take 1 tablet by mouth 2 (two) times daily. 14 tablet 1   No current facility-administered medications for this visit.    PHYSICAL EXAMINATION: ECOG PERFORMANCE STATUS: 1 - Symptomatic but completely ambulatory  Vitals:   06/08/24 1137  BP: 124/60  Pulse: 83  Resp: 17  Temp: 98 F (36.7 C)  SpO2: 100%   Wt Readings from Last 3 Encounters:  06/08/24 250 lb 14.4 oz (113.8 kg)  05/04/24 248 lb (112.5 kg)  04/14/24 247 lb (112 kg)     GENERAL:alert, no distress and comfortable SKIN: skin color, texture, turgor are normal, no rashes or significant lesions EYES: normal, Conjunctiva are pink and non-injected, sclera clear Musculoskeletal:no cyanosis of digits and no clubbing  NEURO: alert & oriented x 3 with fluent speech, no focal motor/sensory deficits  Physical Exam    LABORATORY DATA:  I have reviewed the data as listed    Latest Ref Rng & Units 05/31/2024    2:25 PM 04/05/2024   11:02 AM 02/17/2024    4:51 AM  CBC  WBC 4.0 - 10.5 K/uL 3.0  2.5  4.7   Hemoglobin 12.0 - 15.0 g/dL 88.2  88.9  89.5   Hematocrit 36.0 - 46.0 % 34.9  33.2  32.4   Platelets 150 - 400 K/uL 154  180  186         Latest Ref Rng & Units 05/31/2024    2:25 PM 04/05/2024   11:02 AM 02/17/2024    4:51 AM  CMP  Glucose 70 -  99 mg/dL 90  85  895   BUN 6 - 20 mg/dL 20  17  17    Creatinine 0.44 - 1.00 mg/dL 8.81  8.83  8.84   Sodium 135 - 145 mmol/L 138  137  135   Potassium 3.5 - 5.1 mmol/L 4.1  4.7  4.3   Chloride 98 - 111 mmol/L 105  105  104   CO2 22 - 32 mmol/L 23  28  17    Calcium 8.9 - 10.3 mg/dL 9.4  9.8  9.0   Total Protein 6.5 - 8.1 g/dL 7.0  6.9    Total Bilirubin 0.0 - 1.2 mg/dL 0.4  0.5    Alkaline Phos 38 - 126 U/L 72  78    AST 15 - 41 U/L 23  18    ALT 0 - 44 U/L 15  12        RADIOGRAPHIC STUDIES: I have personally reviewed the radiological images as listed and agreed with the findings in the report. No results found.    No orders of the defined types were placed in this encounter.  All questions were answered. The patient knows to call the clinic with any problems, questions or concerns. No barriers to learning was detected. The total time spent in the appointment was 30 minutes, including review of chart and various tests results, discussions about plan of care and coordination of care plan     Onita Mattock, MD 06/08/2024

## 2024-06-08 NOTE — Assessment & Plan Note (Signed)
 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 She tolerates moderately well with mucositis and skin toxicities.   -she had thoracic radiation (The primary tumor and involved mediastinal adenopathy were treated to 66 Gy in 33 fractions of 2 Gy), completed on 11/04/2021 - her recent restaging CT scan from May 22, 2022 showed continued response in the primary site and thoracic adenopathy, or new lesions. -She is clinically doing well, lives independently, tolerating Tagrisso  well, will continue. - restaging CT from 03/19/2023 showed post radiation change in lung and no evidence of recurrence. Will continue Tagrisso   -she we will continue follow-up with his radiation oncologist Dr. Patrcia for her brain metastasis, as single fraction SRS was recommended based on her recent brain MRI from March 18, 2023 -restaging CT CAP 06/22/2023 showed stable diease, no progression  -PET 10/16/2023,  01/26/2024 and 05/31/24 showed SD

## 2024-07-01 ENCOUNTER — Other Ambulatory Visit: Payer: Self-pay

## 2024-07-01 ENCOUNTER — Other Ambulatory Visit (HOSPITAL_COMMUNITY): Payer: Self-pay

## 2024-07-04 ENCOUNTER — Other Ambulatory Visit: Payer: Self-pay | Admitting: Family Medicine

## 2024-07-04 ENCOUNTER — Other Ambulatory Visit: Payer: Self-pay | Admitting: Urology

## 2024-07-04 DIAGNOSIS — Z1231 Encounter for screening mammogram for malignant neoplasm of breast: Secondary | ICD-10-CM

## 2024-07-05 ENCOUNTER — Other Ambulatory Visit: Payer: Self-pay

## 2024-07-05 NOTE — Progress Notes (Signed)
 Specialty Pharmacy Refill Coordination Note  Abigail Golden is a 59 y.o. female contacted today regarding refills of specialty medication(s) Osimertinib  Mesylate (TAGRISSO )   Patient requested Delivery   Delivery date: 07/12/24   Verified address: [address] [place] [address]   Medication will be filled on: 07/11/24  Patient aware of $12.65 copay. Patient does not have debit or credit card available, will have to use AR. Patient will pay on 1.28.26 when she is in the area for her next appointment.

## 2024-07-06 ENCOUNTER — Encounter: Payer: Self-pay | Admitting: Hematology

## 2024-07-11 ENCOUNTER — Other Ambulatory Visit: Payer: Self-pay

## 2024-07-20 ENCOUNTER — Ambulatory Visit (HOSPITAL_COMMUNITY)
Admission: RE | Admit: 2024-07-20 | Discharge: 2024-07-20 | Disposition: A | Discharge status: Home / Self Care | Source: Ambulatory Visit | Attending: Radiation Oncology | Admitting: Radiation Oncology

## 2024-07-20 DIAGNOSIS — C7931 Secondary malignant neoplasm of brain: Secondary | ICD-10-CM | POA: Insufficient documentation

## 2024-07-20 MED ORDER — GADOBUTROL 1 MMOL/ML IV SOLN
10.0000 mL | Freq: Once | INTRAVENOUS | Status: AC | PRN
  Administered 2024-07-20: 10 mL via INTRAVENOUS

## 2024-07-20 NOTE — Progress Notes (Signed)
 Follow up call to discuss results from brain MRI on 07/20/24.  Nursing interview completed. Patient states she woke up with a migraine this morning but it is subsiding- this is the first one she's had in a few weeks. No other problems with vision changes, dizziness, or seizure like activity. No other complaints today.  IMPRESSION:  1. Unchanged size of multiple treated brain metastases. No evidence of new or  worsening disease.

## 2024-07-21 ENCOUNTER — Other Ambulatory Visit: Payer: Self-pay

## 2024-07-22 ENCOUNTER — Encounter: Payer: Self-pay | Admitting: Pharmacist

## 2024-07-22 ENCOUNTER — Other Ambulatory Visit: Payer: Self-pay

## 2024-07-25 ENCOUNTER — Inpatient Hospital Stay

## 2024-07-26 ENCOUNTER — Other Ambulatory Visit: Payer: Self-pay | Admitting: Pharmacist

## 2024-07-26 ENCOUNTER — Other Ambulatory Visit: Payer: Self-pay

## 2024-07-26 NOTE — Progress Notes (Signed)
 Specialty Pharmacy Ongoing Clinical Assessment Note  Abigail Golden is a 59 y.o. female who is being followed by the specialty pharmacy service for RxSp Oncology   Patient's specialty medication(s) reviewed today: Osimertinib  Mesylate (TAGRISSO )   Missed doses in the last 4 weeks: 0   Patient/Caregiver did not have any additional questions or concerns.   Therapeutic benefit summary: Patient is achieving benefit   Adverse events/side effects summary: No adverse events/side effects   Patient's therapy is appropriate to: Continue    Goals Addressed             This Visit's Progress    Slow Disease Progression   On track    Patient is on track. Patient will maintain adherence. PET scan in April showed stable disease. Patient has some concerns and questions that she plans to ask at next appointment, also said her bloodwork was not as good as she hoped. She had a CT done on 8/5 which will be discussed at next visit.         Follow up: 6 months  Lyle LELON Chalk Specialty Pharmacist

## 2024-07-27 ENCOUNTER — Other Ambulatory Visit: Payer: Self-pay | Admitting: Radiation Therapy

## 2024-07-27 ENCOUNTER — Ambulatory Visit
Admission: RE | Admit: 2024-07-27 | Discharge: 2024-07-27 | Disposition: A | Source: Ambulatory Visit | Attending: Urology | Admitting: Urology

## 2024-07-27 DIAGNOSIS — C7931 Secondary malignant neoplasm of brain: Secondary | ICD-10-CM

## 2024-07-27 DIAGNOSIS — C3490 Malignant neoplasm of unspecified part of unspecified bronchus or lung: Secondary | ICD-10-CM

## 2024-07-27 NOTE — Progress Notes (Signed)
 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 Lung cancer metastatic to brain (HCC)  C34.90    C79.31        Interval Since Last Radiation:  3 months 04/19/24// SRS Brain: The new 2 mm lesion in the left middle frontal gyru was treated to 20 Gy in a single fraction [facility]   03/30/23 - 1010/24// Brain_SRS Site: Brain Technique: SBRT/SRT-IMRT Mode: Photon Dose Per Fraction: 20 Gy Prescribed Dose (Delivered / Prescribed): 20 Gy / 20 Gy Prescribed Fxs (Delivered / Prescribed): 1 / 1   12/10/21: Single fraction SRS brain PTV9 left frontal lobe    09/19/21 - 11/04/21: The primary tumor in the LUL lung and involved mediastinal adenopathy were treated to 66 Gy in 33 fractions of 2 Gy.   08/27/21: Single fraction SRS Brain PTV7-8 in left frontal lobe    05/08/21 - 05/13/21: Fractionated SRS Brain PTV1 - PTV6      Narrative:  I spoke with the patient to conduct her routine scheduled 3 month follow up visit to review results of her recent MRI brain scan via telephone to spare the patient unnecessary potential exposure in the healthcare setting. The patient was notified in advance and gave permission to proceed with this visit format.   She has recovered well from the effects of her recent [facility] treatment. Her post-treatment MRI brain scan from 04/05/24 was reviewed in our multi disciplinary brain conference on 04/11/2024 and showed a new 2 mm enhancing focus in the left middle frontal gyrus suspicious for metastasis but without edema. There was a slightly increased volume of irregular nodular enhancement spanning the the  inferior left frontal and left temporal lobes with mixed interval changes of associated mild edema felt to be most consistent with post-treatment changes as opposed to tumor progression and an unchanged appearance of other previously treated metastases elsewhere. We reviewed the results by telephone on 04/11/24 and the consensus recommendation to proceed with a single fraction SRS to the new 2 mm lesion in the left middle frontal gyrus. She was in agreement to proceed and this was completed on 04/19/24 and tolerated very well. She had some mild fatigue for a few days after the treatment but this has since completely resolved and she has been doing well, without complaint.  On review of systems, the patient states that she is doing well overall. She reports having some occasional headaches that respond to her migraine medications prn. She specifically denies any nausea/vomiting, visual or auditory changes, issues with balance or coordination, difficulties with walking, or numbness/tingling in her extremities. She has chronic fatigue that has been unchanged aside from some increased fatigue and muscle/joint pains that are more pronounced on days that she is more active at the church. Otherwise, she is without complaints and pleased with her progress to date. She continues to tolerate the Tagrisso  fairly well and has remained in close follow up with Dr. Lanny for management of her systemic disease. Her recent restaging PET scan on 05/31/24 appeared stable without evidence of disease recurrance, progression or metastases.   ALLERGIES:  is allergic to  bactrim  [sulfamethoxazole -trimethoprim ], celebrex [celecoxib], and minocycline.  Meds: Current Outpatient Medications  Medication Sig Dispense Refill   benzonatate  (TESSALON ) 100 MG capsule SMARTSIG:1-2 pill By Mouth 1-3 Times Daily     acetaminophen  (TYLENOL ) 500 MG tablet Take 500 mg by mouth every 6 (six) hours as needed for moderate pain or headache.      albuterol  (VENTOLIN  HFA) 108 (90 Base) MCG/ACT inhaler Inhale 2 puffs into the lungs every 4 (four) hours as needed.     ALPRAZolam  (XANAX ) 0.25 MG tablet Take 0.25 mg by mouth every 4 (four) hours as needed for anxiety. for anxiety     citalopram  (CELEXA ) 10 MG tablet Take 10 mg by mouth at bedtime.     Diclofenac Potassium,Migraine, 50 MG PACK Take 1 each by mouth as needed. As needed for migraines     diclofenac sodium (VOLTAREN) 1 % GEL Apply topically as needed. To affected area     diphenhydrAMINE  (BENADRYL ) 25 MG tablet Take 25 mg by mouth See admin instructions. 25 mg at bedtime 25 mg as needed for itching     Docusate Sodium  (STOOL SOFTENER LAXATIVE PO) Take 1 tablet by mouth as needed.     eletriptan (RELPAX) 20 MG tablet Take 20 mg by mouth as needed for headache (at onset of migraine.). May repeat in 2 hours if needed.     fluticasone (FLONASE) 50 MCG/ACT nasal spray Place 2 sprays into both nostrils daily as needed for allergies.     HYDROcodone  bit-homatropine (HYCODAN) 5-1.5 MG/5ML syrup Take 5 mLs by mouth every 6 (six) hours as needed for cough (Cough not relieved with Tessalon  Perles). 120 mL 0   hydrOXYzine  (ATARAX /VISTARIL ) 25 MG tablet Take 25 mg by mouth See admin instructions. 25 mg at bedtime 25 mg during the day as needed for itching/anxiety     loratadine  (CLARITIN ) 10 MG tablet Take 10 mg by mouth daily.     magic mouthwash (lidocaine , diphenhydrAMINE , alum & mag hydroxide) suspension Swish and swallow 5 mLs 3 (three) times daily as needed for mouth pain. 360 mL 2   montelukast  (SINGULAIR ) 10 MG tablet Take 10 mg by mouth at bedtime.     mupirocin  ointment (BACTROBAN ) 2 % Apply 1 Application topically 3 (three) times daily.     nystatin  powder APPLY  POWDER TOPICALLY TO AFFECTED AREA THREE TIMES DAILY 60 g 5   ondansetron  (ZOFRAN ) 8 MG tablet Take 1 tablet (8 mg total) by mouth every 8 (eight) hours as needed for nausea or vomiting. 20 tablet 1   ondansetron  (ZOFRAN -ODT)  4 MG disintegrating tablet DISSOLVE 1 TABLET IN MOUTH EVERY 8 HOURS AS NEEDED FOR NAUSEA AND VOMITING 18 tablet 1   osimertinib  mesylate (TAGRISSO ) 80 MG tablet Take 1 tablet (80 mg total) by mouth daily. 30 tablet 3   prochlorperazine  (COMPAZINE ) 10 MG tablet TAKE 1 TABLET BY MOUTH EVERY 6 HOURS AS NEEDED FOR NAUSEA FOR VOMITING 30 tablet 1   solifenacin (VESICARE) 10 MG tablet Take 10 mg by mouth every evening.     traMADol  (ULTRAM ) 50 MG tablet TAKE 1 TABLET BY MOUTH EVERY 6 HOURS AS NEEDED 60 tablet 0   triamcinolone cream (KENALOG) 0.1 % Apply 1 Application topically 2 (two) times daily.     zolpidem  (AMBIEN ) 10 MG tablet TAKE 1/2 TO 1 (ONE-HALF TO ONE) TABLET BY MOUTH AT BEDTIME AS NEEDED FOR SLEEP 30 tablet 0   No current facility-administered medications for this encounter.    Physical Findings: Today's Vitals  07/27/24 1126  PainSc: 0-No pain   There is no height or weight on file to calculate BMI. Unable to assess due to telephone follow-up visit format.    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 We personally reviewed her MRI brain scan from 07/20/24 which shows a stable appearance of the treated lesions and no new lesions so we discussed the plan to resume close monitoring with serial brain MRIs every 3 months and I will call her to review the results and any recommendations following her each scan. She was educated on signs concerning for disease progression and understands to inform our staff if she  experiences any of these in the interim. She will also continue in routine follow up with Dr. Lanny for management of her systemic disease. She is currently on Tagrisso  therapy and her most recent restaging PET from 05/31/24 showed no evidence of disease progression or recurrence.  She is scheduled for a follow up with Dr. Lanny on 09/01/24.   We appreciate the opportunity to participate in this patient's care. She knows that she is welcome to call with any questions or concerns in the interim.   I personally spent 30 minutes in this encounter including chart review, reviewing radiological studies, telephone conversation with the patient, entering orders, coordinating care and completing documentation.   Sabra MICAEL Rusk, MMS, PA-C [facility] Radiation Oncology Physician Assistant Direct Dial: [phone]  Fax: [phone]     "

## 2024-07-28 ENCOUNTER — Other Ambulatory Visit: Payer: Self-pay | Admitting: Nurse Practitioner

## 2024-07-29 ENCOUNTER — Encounter: Payer: Self-pay | Admitting: Hematology

## 2024-08-17 ENCOUNTER — Ambulatory Visit

## 2024-09-01 ENCOUNTER — Inpatient Hospital Stay: Admitting: Hematology

## 2024-09-01 ENCOUNTER — Inpatient Hospital Stay

## 2024-10-24 ENCOUNTER — Inpatient Hospital Stay: Attending: Hematology

## 2024-10-26 ENCOUNTER — Ambulatory Visit: Admitting: Urology
